# Patient Record
Sex: Female | Born: 1945
Health system: Southern US, Community
[De-identification: ages and names within clinical notes are randomized; demographics above are authoritative.]

## PROBLEM LIST (undated history)

## (undated) DIAGNOSIS — K859 Acute pancreatitis without necrosis or infection, unspecified: Secondary | ICD-10-CM

## (undated) DIAGNOSIS — R011 Cardiac murmur, unspecified: Secondary | ICD-10-CM

## (undated) DIAGNOSIS — F1020 Alcohol dependence, uncomplicated: Secondary | ICD-10-CM

## (undated) DIAGNOSIS — C052 Malignant neoplasm of uvula: Secondary | ICD-10-CM

## (undated) DIAGNOSIS — I10 Essential (primary) hypertension: Secondary | ICD-10-CM

## (undated) DIAGNOSIS — E039 Hypothyroidism, unspecified: Secondary | ICD-10-CM

## (undated) HISTORY — PX: KNEE SURGERY: SHX244

## (undated) HISTORY — PX: COLONOSCOPY: SHX174

## (undated) HISTORY — PX: NECK SURGERY: SHX720

## (undated) HISTORY — PX: TONSILLECTOMY: SUR1361

## (undated) HISTORY — DX: Hypothyroidism, unspecified: E03.9

## (undated) HISTORY — DX: Essential (primary) hypertension: I10

---

## 2009-02-17 ENCOUNTER — Emergency Department (HOSPITAL_COMMUNITY): Admission: EM | Admit: 2009-02-17 | Discharge: 2009-02-17 | Payer: Self-pay | Admitting: Emergency Medicine

## 2009-02-23 ENCOUNTER — Ambulatory Visit: Payer: Self-pay | Admitting: Internal Medicine

## 2009-02-23 DIAGNOSIS — I10 Essential (primary) hypertension: Secondary | ICD-10-CM | POA: Insufficient documentation

## 2009-02-23 DIAGNOSIS — R7309 Other abnormal glucose: Secondary | ICD-10-CM | POA: Insufficient documentation

## 2009-02-23 DIAGNOSIS — D6489 Other specified anemias: Secondary | ICD-10-CM | POA: Insufficient documentation

## 2009-02-23 DIAGNOSIS — E039 Hypothyroidism, unspecified: Secondary | ICD-10-CM | POA: Insufficient documentation

## 2009-02-23 DIAGNOSIS — N181 Chronic kidney disease, stage 1: Secondary | ICD-10-CM | POA: Insufficient documentation

## 2009-02-23 LAB — CONVERTED CEMR LAB
ALT: 21 units/L (ref 0–35)
AST: 30 units/L (ref 0–37)
Alkaline Phosphatase: 49 units/L (ref 39–117)
BUN: 25 mg/dL — ABNORMAL HIGH (ref 6–23)
Basophils Absolute: 0 10*3/uL (ref 0.0–0.1)
CO2: 28 meq/L (ref 19–32)
Chloride: 107 meq/L (ref 96–112)
Creatinine, Ser: 1.1 mg/dL (ref 0.4–1.2)
Eosinophils Absolute: 0.2 10*3/uL (ref 0.0–0.7)
Eosinophils Relative: 2.9 % (ref 0.0–5.0)
Folate: >20 ng/mL
Hemoglobin, Urine: NEGATIVE
Iron: 57 ug/dL (ref 42–145)
Leukocytes, UA: NEGATIVE
Lymphs Abs: 1.7 10*3/uL (ref 0.7–4.0)
MCHC: 33.4 g/dL (ref 30.0–36.0)
Monocytes Absolute: 0.4 10*3/uL (ref 0.1–1.0)
Neutro Abs: 4.1 10*3/uL (ref 1.4–7.7)
Potassium: 4.9 meq/L (ref 3.5–5.1)
Saturation Ratios: 18.7 % — ABNORMAL LOW (ref 20.0–50.0)
Sodium: 143 meq/L (ref 135–145)
Total Protein: 6.8 g/dL (ref 6.0–8.3)
Transferrin: 218.1 mg/dL (ref 212.0–360.0)
Urine Glucose: NEGATIVE mg/dL
WBC: 6.4 10*3/uL (ref 4.5–10.5)

## 2009-02-24 ENCOUNTER — Encounter: Payer: Self-pay | Admitting: Internal Medicine

## 2009-02-24 ENCOUNTER — Telehealth: Payer: Self-pay | Admitting: Internal Medicine

## 2009-02-26 ENCOUNTER — Encounter: Payer: Self-pay | Admitting: Internal Medicine

## 2009-03-02 ENCOUNTER — Telehealth: Payer: Self-pay | Admitting: Internal Medicine

## 2009-05-06 ENCOUNTER — Telehealth: Payer: Self-pay | Admitting: Internal Medicine

## 2009-09-09 ENCOUNTER — Telehealth: Payer: Self-pay | Admitting: Internal Medicine

## 2009-10-15 ENCOUNTER — Telehealth: Payer: Self-pay | Admitting: Internal Medicine

## 2009-10-19 ENCOUNTER — Ambulatory Visit: Payer: Self-pay | Admitting: Internal Medicine

## 2009-10-19 ENCOUNTER — Other Ambulatory Visit: Admission: RE | Admit: 2009-10-19 | Discharge: 2009-10-19 | Payer: Self-pay | Admitting: Internal Medicine

## 2009-10-19 DIAGNOSIS — D485 Neoplasm of uncertain behavior of skin: Secondary | ICD-10-CM | POA: Insufficient documentation

## 2009-10-19 LAB — CONVERTED CEMR LAB
AST: 187 units/L — ABNORMAL HIGH (ref 0–37)
Basophils Relative: 0.8 % (ref 0.0–3.0)
Bilirubin Urine: NEGATIVE
Calcium: 9.5 mg/dL (ref 8.4–10.5)
Cholesterol: 241 mg/dL — ABNORMAL HIGH (ref 0–200)
Creatinine, Ser: 1.4 mg/dL — ABNORMAL HIGH (ref 0.4–1.2)
Eosinophils Relative: 1.4 % (ref 0.0–5.0)
GFR calc non Af Amer: 40.17 mL/min (ref >60)
HDL: 87.2 mg/dL (ref >39.00)
Lymphocytes Relative: 21.6 % (ref 12.0–46.0)
Lymphs Abs: 1.6 10*3/uL (ref 0.7–4.0)
MCHC: 33.3 g/dL (ref 30.0–36.0)
Monocytes Absolute: 0.4 10*3/uL (ref 0.1–1.0)
Monocytes Relative: 5.7 % (ref 3.0–12.0)
Neutrophils Relative %: 70.5 % (ref 43.0–77.0)
Pap Smear: NEGATIVE
Potassium: 4.5 meq/L (ref 3.5–5.1)
RBC: 3.79 M/uL — ABNORMAL LOW (ref 3.87–5.11)
Specific Gravity, Urine: 1.02 (ref 1.000–1.030)
TSH: 55.07 microintl units/mL — ABNORMAL HIGH (ref 0.35–5.50)
Total Protein: 6.9 g/dL (ref 6.0–8.3)
Urine Glucose: NEGATIVE mg/dL
VLDL: 8 mg/dL (ref 0.0–40.0)
pH: 6 (ref 5.0–8.0)

## 2009-10-20 ENCOUNTER — Telehealth: Payer: Self-pay | Admitting: Internal Medicine

## 2009-10-20 DIAGNOSIS — R7401 Elevation of levels of liver transaminase levels: Secondary | ICD-10-CM | POA: Insufficient documentation

## 2009-10-20 DIAGNOSIS — R740 Nonspecific elevation of levels of transaminase and lactic acid dehydrogenase [LDH]: Secondary | ICD-10-CM

## 2009-10-21 ENCOUNTER — Encounter: Payer: Self-pay | Admitting: Internal Medicine

## 2009-10-22 ENCOUNTER — Encounter: Admission: RE | Admit: 2009-10-22 | Discharge: 2009-10-22 | Payer: Self-pay | Admitting: Internal Medicine

## 2009-10-22 ENCOUNTER — Encounter: Payer: Self-pay | Admitting: Internal Medicine

## 2009-10-30 ENCOUNTER — Ambulatory Visit: Payer: Self-pay | Admitting: Internal Medicine

## 2009-10-30 LAB — CONVERTED CEMR LAB
ALT: 91 units/L — ABNORMAL HIGH (ref 0–35)
Bilirubin, Direct: 0 mg/dL (ref 0.0–0.3)
HCV Ab: NEGATIVE
Total Protein: 6.8 g/dL (ref 6.0–8.3)

## 2009-11-04 ENCOUNTER — Encounter: Payer: Self-pay | Admitting: Internal Medicine

## 2009-11-10 ENCOUNTER — Encounter: Admission: RE | Admit: 2009-11-10 | Discharge: 2009-11-10 | Payer: Self-pay | Admitting: Internal Medicine

## 2009-11-10 LAB — HM MAMMOGRAPHY: HM Mammogram: NEGATIVE

## 2009-11-15 ENCOUNTER — Emergency Department (HOSPITAL_COMMUNITY): Admission: EM | Admit: 2009-11-15 | Discharge: 2009-11-16 | Payer: Self-pay | Admitting: Emergency Medicine

## 2009-11-16 ENCOUNTER — Telehealth: Payer: Self-pay | Admitting: Internal Medicine

## 2010-03-02 ENCOUNTER — Emergency Department (HOSPITAL_COMMUNITY): Admission: EM | Admit: 2010-03-02 | Discharge: 2010-03-03 | Payer: Self-pay | Admitting: Emergency Medicine

## 2010-03-05 ENCOUNTER — Emergency Department (HOSPITAL_COMMUNITY): Admission: EM | Admit: 2010-03-05 | Discharge: 2010-03-06 | Payer: Self-pay | Admitting: Emergency Medicine

## 2010-03-05 ENCOUNTER — Ambulatory Visit: Payer: Self-pay | Admitting: Psychiatry

## 2010-03-05 ENCOUNTER — Ambulatory Visit: Payer: Self-pay | Admitting: Internal Medicine

## 2010-03-05 DIAGNOSIS — F10229 Alcohol dependence with intoxication, unspecified: Secondary | ICD-10-CM | POA: Insufficient documentation

## 2010-03-06 ENCOUNTER — Inpatient Hospital Stay (HOSPITAL_COMMUNITY): Admission: AD | Admit: 2010-03-06 | Discharge: 2010-03-08 | Payer: Self-pay | Admitting: Psychiatry

## 2010-03-09 ENCOUNTER — Ambulatory Visit: Payer: Self-pay | Admitting: Internal Medicine

## 2010-04-11 ENCOUNTER — Ambulatory Visit: Payer: Self-pay | Admitting: Psychiatry

## 2010-04-11 ENCOUNTER — Inpatient Hospital Stay (HOSPITAL_COMMUNITY): Admission: RE | Admit: 2010-04-11 | Discharge: 2010-04-15 | Payer: Self-pay | Admitting: Psychiatry

## 2010-04-11 ENCOUNTER — Other Ambulatory Visit: Payer: Self-pay | Admitting: Emergency Medicine

## 2010-04-16 ENCOUNTER — Other Ambulatory Visit (HOSPITAL_COMMUNITY)
Admission: RE | Admit: 2010-04-16 | Discharge: 2010-05-27 | Payer: Self-pay | Source: Home / Self Care | Admitting: Psychiatry

## 2010-05-14 ENCOUNTER — Ambulatory Visit: Payer: Self-pay | Admitting: Psychiatry

## 2010-06-01 ENCOUNTER — Ambulatory Visit (HOSPITAL_COMMUNITY): Payer: Self-pay | Admitting: Psychiatry

## 2010-06-07 ENCOUNTER — Telehealth: Payer: Self-pay | Admitting: Internal Medicine

## 2010-06-07 DIAGNOSIS — H40009 Preglaucoma, unspecified, unspecified eye: Secondary | ICD-10-CM | POA: Insufficient documentation

## 2010-06-08 ENCOUNTER — Ambulatory Visit (HOSPITAL_COMMUNITY): Payer: Self-pay | Admitting: Psychiatry

## 2010-06-14 ENCOUNTER — Encounter: Payer: Self-pay | Admitting: Internal Medicine

## 2010-08-30 ENCOUNTER — Telehealth: Payer: Self-pay | Admitting: Internal Medicine

## 2010-08-31 NOTE — Letter (Signed)
Summary: Results Follow-up Letter  [facility]  [address] [place], [place] [address]   Phone: [phone]  Fax: [phone]    10/21/2009  [address] [place], [place]  [address]  Dear Ms. Tumblin,   The following are the results of your recent test(s):  Test     Result     Pap Smear    Normal____xx___  Not Normal_____       Comments:    _________________________________________________________  Please call for an appointment as directed _________________________________________________________ _________________________________________________________ _________________________________________________________  Sincerely,  Sanda Linger MD [facility]

## 2010-08-31 NOTE — Progress Notes (Signed)
Summary: REFERRAL  Phone Note Call from Patient   Summary of Call: Patient is requesting referral for opthalmologist. Initial call taken by: Lamar Sprinkles, CMA,  June 07, 2010 9:52 AM  Follow-up for Phone Call        and the diagnosis is? Follow-up by: Etta Grandchild MD,  June 07, 2010 9:54 AM  Additional Follow-up for Phone Call Additional follow up Details #1::        Pt says that she needs referral to get her eye pressure checked and annual exam. She needs referral for insurance reasons.  Additional Follow-up by: Lamar Sprinkles, CMA,  June 07, 2010 10:37 AM  New Problems: UNSPECIFIED PREGLAUCOMA (ICD-365.00)   New Problems: UNSPECIFIED PREGLAUCOMA (ICD-365.00)

## 2010-08-31 NOTE — Assessment & Plan Note (Signed)
Summary: 2 WK FU---PER PT PER THIS OFFICE--STC   Vital Signs:  Patient profile:   65 year old female Height:      67 inches Weight:      132 pounds O2 Sat:      97 % on Room air Temp:     98.2 degrees F oral Pulse rate:   60 / minute Pulse rhythm:   regular Resp:     16 per minute BP sitting:   132 / 72  (right arm)  Vitals Entered By: Rock Nephew CMA (October 30, 2009 8:09 AM)  O2 Flow:  Room air  Primary Care Provider:  Etta Grandchild MD   History of Present Illness: She returns for f/up and her only complaint is fatigue.  Allergies: 1)  ! Ace Inhibitors  Past History:  Past Medical History: Reviewed history from 02/23/2009 and no changes required. Hypothyroidism Hypertension  Past Surgical History: Reviewed history from 02/23/2009 and no changes required. Tonsillectomy  Family History: Reviewed history from 10/19/2009 and no changes required. Family History Breast cancer 1st degree relative <50  Social History: Reviewed history from 10/19/2009 and no changes required. Retired Single Alcohol use-no Drug use-no Regular exercise-yes  Review of Systems  The patient denies anorexia, fever, weight loss, weight gain, chest pain, headaches, abdominal pain, hematuria, depression, enlarged lymph nodes, and angioedema.   GI:  Denies abdominal pain, bloody stools, change in bowel habits, diarrhea, indigestion, loss of appetite, nausea, vomiting, and yellowish skin color.  Physical Exam  General:  alert, well-developed, well-nourished, well-hydrated, cooperative to examination, and good hygiene.   Head:  normocephalic and atraumatic.   Eyes:  vision grossly intact, pupils equal, pupils round, and pupils reactive to light.  no icterus. Mouth:  Oral mucosa and oropharynx without lesions or exudates.  Teeth in good repair. Neck:  No deformities, masses, or tenderness noted. Lungs:  Normal respiratory effort, chest expands symmetrically. Lungs are clear to  auscultation, no crackles or wheezes. Heart:  Normal rate and regular rhythm. S1 and S2 normal without gallop, murmur, click, rub or other extra sounds. Abdomen:  soft, non-tender, normal bowel sounds, no distention, no masses, no guarding, no rigidity, no rebound tenderness, no hepatomegaly, and no splenomegaly.   Msk:  No deformity or scoliosis noted of thoracic or lumbar spine.   Pulses:  R and L carotid,radial,femoral,dorsalis pedis and posterior tibial pulses are full and equal bilaterally Extremities:  No clubbing, cyanosis, edema, or deformity noted with normal full range of motion of all joints.   Neurologic:  No cranial nerve deficits noted. Station and gait are normal. Plantar reflexes are down-going bilaterally. DTRs are symmetrical throughout. Sensory, motor and coordinative functions appear intact. Skin:  turgor normal, color normal, no rashes, no suspicious lesions, no ecchymoses, no petechiae, no purpura, no ulcerations, and no edema.   Cervical Nodes:  no anterior cervical adenopathy and no posterior cervical adenopathy.   Axillary Nodes:  no R axillary adenopathy and no L axillary adenopathy.   Inguinal Nodes:  no R inguinal adenopathy and no L inguinal adenopathy.   Psych:  Oriented X3, memory intact for recent and remote, normally interactive, good eye contact, not anxious appearing, not depressed appearing, not agitated, and not suicidal.     Impression & Recommendations:  Problem # 1:  TRANSAMINASES, SERUM, ELEVATED (ICD-790.4) Assessment Unchanged this looks like Fatty Liver Dz. but will look for viral causes as well. Orders: Venipuncture ([address]) TLB-Hepatic/Liver Function Pnl (80076-HEPATIC) T-Hepatitis C Antibody ([id number]) T-Hepatitis Acute  Panel [phone])  Problem # 2:  HYPERTENSION (ICD-401.9) Assessment: Improved  BP today: 132/72 Prior BP: 120/78 (10/19/2009)  Prior 10 Yr Risk Heart Disease: Not enough information (02/23/2009)  Labs Reviewed: K+:  4.5 (10/19/2009) Creat: : 1.4 (10/19/2009)   Chol: 241 (10/19/2009)   HDL: 87.20 (10/19/2009)   TG: 40.0 (10/19/2009)  Problem # 3:  HYPOTHYROIDISM (ICD-244.9) Assessment: Improved  Her updated medication list for this problem includes:    Levothroid 125 Mcg Tabs (Levothyroxine sodium) ..... One by mouth once daily for thyroid  Labs Reviewed: TSH: 55.07 (10/19/2009)    HgBA1c: 5.7 (10/19/2009) Chol: 241 (10/19/2009)   HDL: 87.20 (10/19/2009)   TG: 40.0 (10/19/2009)  Problem # 4:  KIDNEY DISEASE, CHRONIC, STAGE I (ICD-585.1) Assessment: Improved  Labs Reviewed: BUN: 29 (10/19/2009)   Cr: 1.4 (10/19/2009)    Hgb: 12.1 (10/19/2009)   Hct: 36.5 (10/19/2009)   Ca++: 9.5 (10/19/2009)    TP: 6.9 (10/19/2009)   Alb: 4.1 (10/19/2009)  Complete Medication List: 1)  Antabuse 250 Mg Tabs (Disulfiram) .... Take 1 tablet by mouth once a day 2)  Calcium/vitamind  3)  Daily Multivitamin  4)  Folic Acid  5)  Vitamin B12  6)  Prozac 40 Mg Caps (Fluoxetine hcl) .... Take 1 tab once daily 7)  Vitamin D3 1000 Unit Caps (Cholecalciferol) .... Take once daily 8)  Herbal Laxative  .... Once daily as needed 9)  Levothroid 125 Mcg Tabs (Levothyroxine sodium) .... One by mouth once daily for thyroid  Patient Instructions: 1)  Please schedule a follow-up appointment in 2 months. 2)  It is important that you exercise regularly at least 20 minutes 5 times a week. If you develop chest pain, have severe difficulty breathing, or feel very tired , stop exercising immediately and seek medical attention. 3)  You need to lose weight. Consider a lower calorie diet and regular exercise.

## 2010-08-31 NOTE — Progress Notes (Signed)
Summary: medication unavaliable  Phone Note From Pharmacy   Caller: [facility] Summary of Call: Per [facility], Antabuse 250mg  and 500mg  are unavailable from the manufacturer. Would you like to  substitute and if so what would you like to switch to? Thanks Initial call taken by: Rock Nephew CMA,  September 09, 2009 9:08 AM  Follow-up for Phone Call        ask the pharmacy what doses are available Follow-up by: Etta Grandchild MD,  September 09, 2009 9:11 AM  Additional Follow-up for Phone Call Additional follow up Details #1::        [facility] does not have any of the med available, but suggest that we check local pharmacy or change med. Patient local pharmacy did have the prescription they will fill now for patient. I tried to call and notify patient--Rec'd automated message that [employer] customer is not available. Additional Follow-up by: Lucious Groves,  September 14, 2009 3:40 PM    Additional Follow-up for Phone Call Additional follow up Details #2::    Tried to reach patient again and rec'd the same automated message. Follow-up by: Lucious Groves,  September 15, 2009 2:47 PM  Additional Follow-up for Phone Call Additional follow up Details #3:: Details for Additional Follow-up Action Taken: No response from patient x several attemps. Closing phone note Additional Follow-up by: Rock Nephew CMA,  September 17, 2009 11:23 AM

## 2010-08-31 NOTE — Consult Note (Signed)
Summary: [facility]   Imported By: Lester [place]erville 06/22/2010 07:24:23  _____________________________________________________________________  External Attachment:    Type:   Image     Comment:   External Document

## 2010-08-31 NOTE — Progress Notes (Signed)
Summary: Thyroid med  Phone Note Call from Patient Call back at Home Phone [phone]   Caller: Patient Summary of Call: pt called Triage requesting a call Initial call taken by: Margaret Pyle, CMA,  October 20, 2009 1:32 PM  Follow-up for Phone Call        Patient would like to know why her thyroid pills have changed. She would also like to take her lower dose tabs that she has already paid for instead of picking up a new prescription. Can she take 1.5 or 2 of the tabs she currently has. Please advise. Follow-up by: Lucious Groves,  October 21, 2009 8:56 AM  Additional Follow-up for Phone Call Additional follow up Details #1::        i think she needs the new, higher dose but if she wants to do it a different way she can Additional Follow-up by: Etta Grandchild MD,  October 21, 2009 9:05 AM    Additional Follow-up for Phone Call Additional follow up Details #2::    I notified patient. She is worried b/c we sent in 125 mcg and pharmacy gave her .125mg . She will call her pharmacy to discuss. Patient will take her 50 micrograms tabs, but be sure that she is taking 125 micrograms as md prescribed. Follow-up by: Lucious Groves,  October 21, 2009 10:49 AM

## 2010-08-31 NOTE — Letter (Signed)
Summary: Lipid Letter  [facility]  [address] [place], [place] [address]   Phone: [phone]  Fax: [phone]    10/19/2009  Mary Graves [address] [place], [place]  [address]  Dear Ms. Scarlette Calico:  We have carefully reviewed your last lipid profile from  and the results are noted below with a summary of recommendations for lipid management.    Cholesterol:       241     Goal: <200   HDL "good" Cholesterol:   57.84     Goal: >40   LDL "bad" Cholesterol:   142     Goal: <130   Triglycerides:       40.0     Goal: <150        TLC Diet (Therapeutic Lifestyle Change): Saturated Fats & Transfatty acids should be kept < 7% of total calories ***Reduce Saturated Fats Polyunstaurated Fat can be up to 10% of total calories Monounsaturated Fat Fat can be up to 20% of total calories Total Fat should be no greater than 25-35% of total calories Carbohydrates should be 50-60% of total calories Protein should be approximately 15% of total calories Fiber should be at least 20-30 grams a day ***Increased fiber may help lower LDL Total Cholesterol should be < 200mg /day Consider adding plant stanol/sterols to diet (example: Benacol spread) ***A higher intake of unsaturated fat may reduce Triglycerides and Increase HDL    Adjunctive Measures (may lower LIPIDS and reduce risk of Heart Attack) include: Aerobic Exercise (20-30 minutes 3-4 times a week) Limit Alcohol Consumption Weight Reduction Aspirin 75-81 mg a day by mouth (if not allergic or contraindicated) Dietary Fiber 20-30 grams a day by mouth     Current Medications: 1)    Levothroid 50 Mcg Tabs (Levothyroxine sodium) .... Take 1 tablet by mouth once a day 2)    Antabuse 250 Mg Tabs (Disulfiram) .... Take 1 tablet by mouth once a day 3)    Calcium/vitamind  4)    Daily Multivitamin  5)    Folic Acid  6)    Vitamin B12  7)    Prozac 40 Mg Caps (Fluoxetine hcl) .... Take 1 tab once daily 8)    Vitamin D3  1000 Unit Caps (Cholecalciferol) .... Take once daily 9)    Herbal Laxative  .... Once daily as needed  If you have any questions, please call. We appreciate being able to work with you.   Sincerely,    [facility] Etta Grandchild MD

## 2010-08-31 NOTE — Letter (Signed)
Summary: Results Follow-up Letter  [facility]  [address] [place], [place] [address]   Phone: [phone]  Fax: [phone]    11/04/2009  [address] [place], [place]  [address]  Dear Ms. Nicasio,   The following are the results of your recent test(s):  Test     Result     Liver enzymes   slightly elevated Viral hepatitis   all negative  _________________________________________________________  Please call for an appointment as directed, let me know if you want to be vaccinated for Hepatits A and B _________________________________________________________ _________________________________________________________ _________________________________________________________  Sincerely,  Sanda Linger MD [facility]

## 2010-08-31 NOTE — Letter (Signed)
Summary: Results Follow-up Letter  [facility]  [address] [place], [place] [address]   Phone: [phone]  Fax: [phone]    10/19/2009  [address] [place], [place]  [address]  Dear Ms. Baggett,   The following are the results of your recent test(s):  Test     Result     Kidney     mild dysfunction CBC       normal Liver       moderately elevated enzymes Blood sugars   normal   _________________________________________________________  Please call for an appointment soon to recheck liver _________________________________________________________ _________________________________________________________ _________________________________________________________  Sincerely,  Sanda Linger MD [facility]

## 2010-08-31 NOTE — Progress Notes (Signed)
Summary: referrals/labs  Phone Note Call from Patient Call back at [phone]   Summary of Call: Patient left message on triage that she needs a referral for mammogram, and dermatologist. Patient also states that due to several of her meds, she needs labs. Patient is aware she has not been seen in sometime. Please advise. Initial call taken by: Lucious Groves,  October 15, 2009 3:18 PM  Follow-up for Phone Call        needs to be seen Follow-up by: Etta Grandchild MD,  October 15, 2009 3:24 PM  Additional Follow-up for Phone Call Additional follow up Details #1::        left message on machine to call back to office. Additional Follow-up by: Lucious Groves,  October 15, 2009 3:37 PM    Additional Follow-up for Phone Call Additional follow up Details #2::    Unable to leave vm........................................Marland KitchenLamar Sprinkles, CMA  October 15, 2009 5:26 PM   Additional Follow-up for Phone Call Additional follow up Details #3:: Details for Additional Follow-up Action Taken: Pt scheduled for physcial  Additional Follow-up by: Lamar Sprinkles, CMA,  October 16, 2009 5:47 PM

## 2010-08-31 NOTE — Letter (Signed)
Summary: Results Follow-up Letter  [facility]  [address] [place], [place] [address]   Phone: [phone]  Fax: [phone]    10/22/2009  [address] [place], [place]  [address]  Dear Mary Graves,   The following are the results of your recent test(s):  Test       Result     Liver ultrasound     fatty liver  _________________________________________________________  Please call for an appointment as directed _________________________________________________________ _________________________________________________________ _________________________________________________________  Sincerely,  Sanda Linger MD [facility]

## 2010-08-31 NOTE — Progress Notes (Signed)
  Phone Note Outgoing Call   Summary of Call: i notified pt. of high TSH and the need for a higher dose as well as high LFT's, she will stop taking antabuse and get an u/s done of the liver Initial call taken by: Etta Grandchild MD,  October 20, 2009 8:36 AM  New Problems: TRANSAMINASES, SERUM, ELEVATED (ICD-790.4)   New Problems: TRANSAMINASES, SERUM, ELEVATED (ICD-790.4) New/Updated Medications: LEVOTHROID 125 MCG TABS (LEVOTHYROXINE SODIUM) One by mouth once daily for thyroid Prescriptions: LEVOTHROID 125 MCG TABS (LEVOTHYROXINE SODIUM) One by mouth once daily for thyroid  #90 x 1   Entered and Authorized by:   Etta Grandchild MD   Signed by:   Etta Grandchild MD on 10/20/2009   Method used:   Electronically to        [facility] # [phone]* (retail)       [address]       [place], [place]  [address]       Ph: [phone]       Fax: [phone]   RxID:   [id number]

## 2010-08-31 NOTE — Assessment & Plan Note (Signed)
Summary: shot of vivotrol-lb   Vital Signs:  Patient profile:   65 year old female Height:      67 inches Weight:      128 pounds BMI:     20.12 O2 Sat:      98 % on Room air Temp:     98.1 degrees F oral Pulse rate:   82 / minute Pulse rhythm:   regular Resp:     16 per minute BP sitting:   120 / 68 Cuff size:   regular  Vitals Entered By: Rock Nephew CMA (March 09, 2010 8:26 AM)  O2 Flow:  Room air CC: ER follow up/ discuss injection  Is Patient Diabetic? No Pain Assessment Patient in pain? no        Primary Care Jolene Guyett:  Etta Grandchild MD  CC:  ER follow up/ discuss injection .  History of Present Illness: She returns for f/up and she says that was discharged from [facility] yesterday after a stay for detox. She says that she is doing well with sponsor contact, daily AA mettings, and evening therapy at the [facility]. She wants a shot of Vivitrol. She has had no alcohol for 4 days.  Preventive Screening-Counseling & Management  Alcohol-Tobacco     Alcohol drinks/day: >5     Alcohol type: wine     >5/day in last 3 mos: yes     Alcohol Counseling: to STOP drinking     Feels need to cut down: yes     Feels annoyed by complaints: yes     Feels guilty re: drinking: yes     Needs 'eye opener' in am: yes     Smoking Status: quit     Tobacco Counseling: to remain off tobacco products  Hep-HIV-STD-Contraception     Hepatitis Risk: no risk noted     HIV Risk: no risk noted     STD Risk: no risk noted  Medications Prior to Update: 1)  Antabuse 250 Mg Tabs (Disulfiram) .... Take 1 Tablet By Mouth Once A Day 2)  Calcium/vitamind 3)  Daily Multivitamin 4)  Folic Acid 5)  Vitamin B12 6)  Prozac 40 Mg Caps (Fluoxetine Hcl) .... Take 1 Tab Once Daily 7)  Vitamin D3 1000 Unit Caps (Cholecalciferol) .... Take Once Daily 8)  Herbal Laxative .... Once Daily As Needed 9)  Levothroid 125 Mcg Tabs (Levothyroxine Sodium) .... One By Mouth Once Daily For Thyroid  Current  Medications (verified): 1)  Antabuse 250 Mg Tabs (Disulfiram) .... Take 1 Tablet By Mouth Once A Day 2)  Calcium/vitamind 3)  Daily Multivitamin 4)  Folic Acid 5)  Vitamin B12 6)  Prozac 40 Mg Caps (Fluoxetine Hcl) .... Take 1 Tab Once Daily 7)  Vitamin D3 1000 Unit Caps (Cholecalciferol) .... Take Once Daily 8)  Herbal Laxative .... Once Daily As Needed 9)  Levothroid 125 Mcg Tabs (Levothyroxine Sodium) .... One By Mouth Once Daily For Thyroid  Allergies (verified): 1)  ! Ace Inhibitors  Past History:  Past Medical History: Last updated: 02/23/2009 Hypothyroidism Hypertension  Past Surgical History: Last updated: 02/23/2009 Tonsillectomy  Family History: Last updated: 10/19/2009 Family History Breast cancer 1st degree relative <50  Social History: Last updated: 10/19/2009 Retired Single Alcohol use-no Drug use-no Regular exercise-yes  Risk Factors: Alcohol Use: >5 (03/09/2010) >5 drinks/d w/in last 3 months: yes (03/09/2010) Exercise: yes (10/19/2009)  Risk Factors: Smoking Status: quit (03/09/2010)  Family History: Reviewed history from 10/19/2009 and no changes required.  Family History Breast cancer 1st degree relative <50  Social History: Reviewed history from 10/19/2009 and no changes required. Retired Single Alcohol use-no Drug use-no Regular exercise-yes  Review of Systems  The patient denies anorexia, fever, weight loss, chest pain, syncope, dyspnea on exertion, peripheral edema, prolonged cough, headaches, hemoptysis, abdominal pain, difficulty walking, and depression.   Psych:  Denies anxiety, depression, easily angered, easily tearful, irritability, mental problems, panic attacks, sense of great danger, suicidal thoughts/plans, thoughts of violence, unusual visions or sounds, and thoughts /plans of harming others.  Physical Exam  General:  alert, well-developed, well-nourished, well-hydrated, appropriate dress, normal appearance,  healthy-appearing, cooperative to examination, and good hygiene.   Head:  normocephalic, atraumatic, no abnormalities observed, and no abnormalities palpated.   Eyes:  vision grossly intact, pupils equal, pupils round, and pupils reactive to light.   Ears:  R ear normal and L ear normal.   Mouth:  Oral mucosa and oropharynx without lesions or exudates.  Teeth in good repair. Neck:  supple, full ROM, no masses, no thyromegaly, and no thyroid nodules or tenderness.   Lungs:  Normal respiratory effort, chest expands symmetrically. Lungs are clear to auscultation, no crackles or wheezes. Heart:  Normal rate and regular rhythm. S1 and S2 normal without gallop, murmur, click, rub or other extra sounds. Abdomen:  soft, non-tender, normal bowel sounds, no distention, no masses, no guarding, no rigidity, no rebound tenderness, no abdominal hernia, no inguinal hernia, no hepatomegaly, and no splenomegaly.   Msk:  No deformity or scoliosis noted of thoracic or lumbar spine.   Extremities:  No clubbing, cyanosis, edema, or deformity noted with normal full range of motion of all joints.   Neurologic:  No cranial nerve deficits noted. Station and gait are normal. Plantar reflexes are down-going bilaterally. DTRs are symmetrical throughout. Sensory, motor and coordinative functions appear intact. Skin:  Intact without suspicious lesions or rashes Psych:  Oriented X3, memory intact for recent and remote, normally interactive, good eye contact, not anxious appearing, not depressed appearing, not agitated, not suicidal, and not homicidal.     Impression & Recommendations:  Problem # 1:  ACUT ALCOHOLIC INTOXICATION EPISODIC DRUNKENNESS (ICD-303.02) she is referred to her Psychiatrist, Dr. Juel Burrow, to discuss the use of Vivitrol and naltrexone, this is not a service that is provided here, we do not keep Vivitrol injection here. she will continue the other forms of support.  Problem # 2:  SUICIDE AND SELF-INFLICTED  INJURY BY SUBMERSION (ICD-E954) Assessment: Improved  Problem # 3:  HYPERTENSION (ICD-401.9) Assessment: Improved  BP today: 120/68 Prior BP: 138/84 (03/05/2010)  Prior 10 Yr Risk Heart Disease: Not enough information (02/23/2009)  Labs Reviewed: K+: 4.5 (10/19/2009) Creat: : 1.4 (10/19/2009)   Chol: 241 (10/19/2009)   HDL: 87.20 (10/19/2009)   TG: 40.0 (10/19/2009)  Complete Medication List: 1)  Calcium/vitamind  2)  Daily Multivitamin  3)  Folic Acid  4)  Vitamin B12  5)  Prozac 40 Mg Caps (Fluoxetine hcl) .... Take 1 tab once daily 6)  Vitamin D3 1000 Unit Caps (Cholecalciferol) .... Take once daily 7)  Herbal Laxative  .... Once daily as needed 8)  Levothroid 125 Mcg Tabs (Levothyroxine sodium) .... One by mouth once daily for thyroid  Patient Instructions: 1)  Please schedule a follow-up appointment in 2 weeks. 2)  Check your Blood Pressure regularly. If it is above 140/90: you should make an appointment. Prescriptions: LEVOTHROID 125 MCG TABS (LEVOTHYROXINE SODIUM) One by mouth once daily for thyroid  #  90 x 1   Entered and Authorized by:   Etta Grandchild MD   Signed by:   Etta Grandchild MD on 03/09/2010   Method used:   Printed then faxed to ...       [facility] (mail-order)             , [place]         Ph: [phone]       Fax: [phone]   RxID:   [id number]

## 2010-08-31 NOTE — Assessment & Plan Note (Signed)
Summary: ER FU / NWS #   Vital Signs:  Patient profile:   65 year old female Height:      67 inches Weight:      127 pounds BMI:     19.96 O2 Sat:      96 % on Room air Temp:     98.4 degrees F oral Pulse rate:   105 / minute Resp:     16 per minute BP sitting:   138 / 84  (left arm) Cuff size:   regular  Vitals Entered By: Bill Salinas CMA (March 05, 2010 11:10 AM)  O2 Flow:  Room air  Primary Care Provider:  Etta Grandchild MD   History of Present Illness: She returns for f/up and has been drinking heavily. She was seen at [facility] 3 days ago and left. Yesterday to tried to commit  suicide by jumping  into [facility] but was pulled out by someone stronger than her. She admits to feeling suicidal with a plan and says she has given up and she can't stop drinking without a detox in rehab.  Preventive Screening-Counseling & Management  Alcohol-Tobacco     Alcohol drinks/day: >5     Alcohol type: wine     >5/day in last 3 mos: yes     Alcohol Counseling: to STOP drinking     Feels need to cut down: yes     Feels annoyed by complaints: yes     Feels guilty re: drinking: yes     Needs 'eye opener' in am: yes     Smoking Status: quit     Tobacco Counseling: to remain off tobacco products  Hep-HIV-STD-Contraception     Hepatitis Risk: no risk noted     HIV Risk: no risk noted     STD Risk: no risk noted  Medications Prior to Update: 1)  Antabuse 250 Mg Tabs (Disulfiram) .... Take 1 Tablet By Mouth Once A Day 2)  Calcium/vitamind 3)  Daily Multivitamin 4)  Folic Acid 5)  Vitamin B12 6)  Prozac 40 Mg Caps (Fluoxetine Hcl) .... Take 1 Tab Once Daily 7)  Vitamin D3 1000 Unit Caps (Cholecalciferol) .... Take Once Daily 8)  Herbal Laxative .... Once Daily As Needed 9)  Levothroid 125 Mcg Tabs (Levothyroxine Sodium) .... One By Mouth Once Daily For Thyroid  Current Medications (verified): 1)  Antabuse 250 Mg Tabs (Disulfiram) .... Take 1 Tablet By Mouth Once A Day 2)   Calcium/vitamind 3)  Daily Multivitamin 4)  Folic Acid 5)  Vitamin B12 6)  Prozac 40 Mg Caps (Fluoxetine Hcl) .... Take 1 Tab Once Daily 7)  Vitamin D3 1000 Unit Caps (Cholecalciferol) .... Take Once Daily 8)  Herbal Laxative .... Once Daily As Needed 9)  Levothroid 125 Mcg Tabs (Levothyroxine Sodium) .... One By Mouth Once Daily For Thyroid  Allergies (verified): 1)  ! Ace Inhibitors  Past History:  Past Medical History: Last updated: 02/23/2009 Hypothyroidism Hypertension  Past Surgical History: Last updated: 02/23/2009 Tonsillectomy  Family History: Last updated: 10/19/2009 Family History Breast cancer 1st degree relative <50  Social History: Last updated: 10/19/2009 Retired Single Alcohol use-no Drug use-no Regular exercise-yes  Risk Factors: Alcohol Use: >5 (03/05/2010) >5 drinks/d w/in last 3 months: yes (03/05/2010) Exercise: yes (10/19/2009)  Risk Factors: Smoking Status: quit (03/05/2010)  Family History: Reviewed history from 10/19/2009 and no changes required. Family History Breast cancer 1st degree relative <50  Social History: Reviewed history from 10/19/2009 and no changes  required. Retired Single Alcohol use-no Drug use-no Regular exercise-yes  Review of Systems       The patient complains of depression.  The patient denies anorexia, fever, chest pain, syncope, dyspnea on exertion, peripheral edema, prolonged cough, headaches, hemoptysis, and abdominal pain.   Psych:  Complains of anxiety, depression, easily angered, easily tearful, irritability, mental problems, panic attacks, sense of great danger, and suicidal thoughts/plans; denies unusual visions or sounds and thoughts /plans of harming others.  Physical Exam  General:  underweight appearing, disheveled, and mild distress.   Head:  normocephalic and atraumatic.   Eyes:  vision grossly intact, pupils equal, pupils round, and pupils reactive to light.   Mouth:  Oral mucosa and  oropharynx without lesions or exudates.  Teeth in good repair. Neck:  supple, full ROM, no masses, no thyromegaly, no JVD, normal carotid upstroke, and no carotid bruits.   Lungs:  normal respiratory effort, no intercostal retractions, no accessory muscle use, normal breath sounds, no dullness, no fremitus, no crackles, and no wheezes.   Heart:  normal rate, regular rhythm, no murmur, no gallop, no rub, and no JVD.   Abdomen:  soft, non-tender, normal bowel sounds, no distention, no masses, no guarding, no rigidity, no rebound tenderness, no abdominal hernia, no inguinal hernia, no hepatomegaly, and no splenomegaly.   Msk:  No deformity or scoliosis noted of thoracic or lumbar spine.   Pulses:  R and L carotid,radial,femoral,dorsalis pedis and posterior tibial pulses are full and equal bilaterally Extremities:  No clubbing, cyanosis, edema, or deformity noted with normal full range of motion of all joints.   Neurologic:  No cranial nerve deficits noted. Station and gait are normal. Plantar reflexes are down-going bilaterally. DTRs are symmetrical throughout. Sensory, motor and coordinative functions appear intact. Skin:  Intact without suspicious lesions or rashes Psych:  dysphoric affect, withdrawn, tearful, moderately anxious, poor concentration, memory impairment, and judgment poor.     Impression & Recommendations:  Problem # 1:  SUICIDE AND SELF-INFLICTED INJURY BY SUBMERSION (ICD-E954) Assessment New EMS called-she will be taken to the ER under custody for her protection  Problem # 2:  ACUT ALCOHOLIC INTOXICATION EPISODIC DRUNKENNESS (ICD-303.02) Assessment: Deteriorated  Complete Medication List: 1)  Antabuse 250 Mg Tabs (Disulfiram) .... Take 1 tablet by mouth once a day 2)  Calcium/vitamind  3)  Daily Multivitamin  4)  Folic Acid  5)  Vitamin B12  6)  Prozac 40 Mg Caps (Fluoxetine hcl) .... Take 1 tab once daily 7)  Vitamin D3 1000 Unit Caps (Cholecalciferol) .... Take once  daily 8)  Herbal Laxative  .... Once daily as needed 9)  Levothroid 125 Mcg Tabs (Levothyroxine sodium) .... One by mouth once daily for thyroid  Patient Instructions: 1)  Please schedule a follow-up appointment as needed.

## 2010-08-31 NOTE — Progress Notes (Signed)
Summary: REQ FOR RX  Phone Note Call from Patient Call back at [phone]   Summary of Call: Pt was at the ER last night. She stopped anabuse when she was told her liver enzymes were elevated. Since stopping med she resumed drinking alcohol again. She is trying to detox herself, which she says she has done on multiple occasions over the years. Per pt she has been thru many different detox facilities in the past. ER last night did not refer pt or give any suggestions. She is req rx for ativan or valium to help in this process.  Pt has sponsor comming over tonight and has not had any alcohol today.   Initial call taken by: Lamar Sprinkles, CMA,  November 16, 2009 2:58 PM  Follow-up for Phone Call        ativan and valium are crutches just like alcohol, I would prefer that shoe not start them Follow-up by: Etta Grandchild MD,  November 16, 2009 3:12 PM  Additional Follow-up for Phone Call Additional follow up Details #1::        Patient says that the only ways she can get thru the detox process w/alcohol w/o the help of above.   Also, how does she restart anabuse?  Additional Follow-up by: Lamar Sprinkles, CMA,  November 16, 2009 6:09 PM    Additional Follow-up for Phone Call Additional follow up Details #2::    just restart at the previous dose Follow-up by: Etta Grandchild MD,  November 17, 2009 7:32 AM  Additional Follow-up for Phone Call Additional follow up Details #3:: Details for Additional Follow-up Action Taken: Patient informed, She continues to request small qty of valium to help w/detox. Advised ER or contact detox center. Pt declines, says she has been an alcoholic > 40 yrs and thru multiple detox facilitys. She refuses ER or behav health. Offered apt to see Dr Yetta Barre, pt again refused.  Additional Follow-up by: Lamar Sprinkles, CMA,  November 17, 2009 11:34 AM

## 2010-08-31 NOTE — Assessment & Plan Note (Signed)
Summary: physical / SD   Vital Signs:  Patient profile:   65 year old female Height:      67 inches (170.18 cm) Weight:      133.38 pounds (60.63 kg) O2 Sat:      97 % on Room air Temp:     97.4 degrees F (36.33 degrees C) oral Pulse rate:   68 / minute Pulse rhythm:   regular Resp:     16 per minute BP sitting:   120 / 78  (left arm) Cuff size:   regular  Vitals Entered By: Rock Nephew CMA (October 19, 2009 8:17 AM) Taken by Debby Freiberg Christopher,SMA  O2 Flow:  Room air CC: PHYSICAL, Preventive Care   Primary Care Provider:  Etta Grandchild MD  CC:  PHYSICAL and Preventive Care.  History of Present Illness: She returns for f/up and requests a complete physical. She has a lesion on the left side of her face that she is concerned about and she requests a derm. referral.  Preventive Screening-Counseling & Management  Alcohol-Tobacco     Alcohol drinks/day: 0     >5/day in last 3 mos: yes     Alcohol Counseling: to STOP drinking     Feels need to cut down: yes     Feels annoyed by complaints: yes     Feels guilty re: drinking: yes     Needs 'eye opener' in am: no     Smoking Status: quit  Caffeine-Diet-Exercise     Does Patient Exercise: yes  Hep-HIV-STD-Contraception     Hepatitis Risk: no risk noted     HIV Risk: no risk noted     STD Risk: no risk noted      Drug Use:  no.    Clinical Review Panels:  Prevention   Last Colonoscopy:  done (11/29/2008)   Medications Prior to Update: 1)  Levothroid 50 Mcg Tabs (Levothyroxine Sodium) .... Take 1 Tablet By Mouth Once A Day 2)  Antabuse 250 Mg Tabs (Disulfiram) .... Take 1 Tablet By Mouth Once A Day 3)  Calcium/vitamind 4)  Daily Multivitamin 5)  Folic Acid 6)  Vitamin B12 7)  Lisinopril 10 Mg Tabs (Lisinopril) .... Take 1 Tablet By Mouth Once A Day  Current Medications (verified): 1)  Levothroid 50 Mcg Tabs (Levothyroxine Sodium) .... Take 1 Tablet By Mouth Once A Day 2)  Antabuse 250 Mg Tabs (Disulfiram)  .... Take 1 Tablet By Mouth Once A Day 3)  Calcium/vitamind 4)  Daily Multivitamin 5)  Folic Acid 6)  Vitamin B12 7)  Prozac 40 Mg Caps (Fluoxetine Hcl) .... Take 1 Tab Once Daily 8)  Vitamin D3 1000 Unit Caps (Cholecalciferol) .... Take Once Daily 9)  Herbal Laxative .... Once Daily As Needed  Allergies (verified): 1)  ! Ace Inhibitors  Past History:  Past Medical History: Reviewed history from 02/23/2009 and no changes required. Hypothyroidism Hypertension  Past Surgical History: Reviewed history from 02/23/2009 and no changes required. Tonsillectomy  Family History: Family History Breast cancer 1st degree relative <50  Social History: Retired Single Alcohol use-no Drug use-no Regular exercise-yes Hepatitis Risk:  no risk noted HIV Risk:  no risk noted STD Risk:  no risk noted Drug Use:  no Does Patient Exercise:  yes  Review of Systems       The patient complains of suspicious skin lesions.  The patient denies anorexia, fever, weight loss, weight gain, chest pain, syncope, dyspnea on exertion, peripheral edema, prolonged cough, headaches, hemoptysis,  abdominal pain, melena, hematochezia, severe indigestion/heartburn, hematuria, muscle weakness, depression, unusual weight change, abnormal bleeding, enlarged lymph nodes, angioedema, and breast masses.   GI:  Complains of constipation; denies abdominal pain, bloody stools, diarrhea, excessive appetite, gas, indigestion, loss of appetite, nausea, vomiting, and yellowish skin color. Psych:  Denies anxiety, depression, easily angered, easily tearful, irritability, mental problems, panic attacks, sense of great danger, suicidal thoughts/plans, thoughts of violence, and unusual visions or sounds. Endo:  Denies cold intolerance, excessive hunger, excessive thirst, excessive urination, heat intolerance, polyuria, and weight change. Heme:  Denies abnormal bruising, bleeding, enlarge lymph nodes, fevers, pallor, and skin  discoloration.  Physical Exam  General:  alert, well-developed, well-nourished, well-hydrated, cooperative to examination, and good hygiene.   Head:  normocephalic and atraumatic.   Eyes:  vision grossly intact, pupils equal, pupils round, and pupils reactive to light.   Mouth:  Oral mucosa and oropharynx without lesions or exudates.  Teeth in good repair. Neck:  supple, full ROM, no masses, no carotid bruits, no cervical lymphadenopathy, and no neck tenderness.   Chest Wall:  No deformities, masses, or tenderness noted. Breasts:  No mass, nodules, thickening, tenderness, bulging, retraction, inflamation, nipple discharge or skin changes noted.   Lungs:  Normal respiratory effort, chest expands symmetrically. Lungs are clear to auscultation, no crackles or wheezes. Heart:  Normal rate and regular rhythm. S1 and S2 normal without gallop, murmur, click, rub or other extra sounds. Abdomen:  Bowel sounds positive,abdomen soft and non-tender without masses, organomegaly or hernias noted. Rectal:  no external abnormalities, no hemorrhoids, normal sphincter tone, no masses, no tenderness, no fissures, no fistulae, and no perianal rash.  heme neg. stool. Genitalia:  normal introitus, no external lesions, no vaginal discharge, mucosa pink and moist, no vaginal or cervical lesions, no vaginal atrophy, no friaility or hemorrhage, normal uterus size and position, and no adnexal masses or tenderness.   Msk:  No deformity or scoliosis noted of thoracic or lumbar spine.   Pulses:  R and L carotid,radial,femoral,dorsalis pedis and posterior tibial pulses are full and equal bilaterally Extremities:  No clubbing, cyanosis, edema, or deformity noted with normal full range of motion of all joints.   Neurologic:  No cranial nerve deficits noted. Station and gait are normal. Plantar reflexes are down-going bilaterally. DTRs are symmetrical throughout. Sensory, motor and coordinative functions appear intact. Skin:  on  the left malar surface she has a vascular lesion that looks like sun-damaged skin. turgor normal, color normal, no rashes, no suspicious lesions, no ecchymoses, no petechiae, no purpura, no ulcerations, no edema, and solar damage.   Cervical Nodes:  no anterior cervical adenopathy and no posterior cervical adenopathy.   Axillary Nodes:  no R axillary adenopathy and no L axillary adenopathy.   Inguinal Nodes:  no R inguinal adenopathy and no L inguinal adenopathy.   Psych:  Oriented X3, memory intact for recent and remote, normally interactive, good eye contact, not anxious appearing, not depressed appearing, not agitated, and not suicidal.     Impression & Recommendations:  Problem # 1:  ROUTINE GENERAL MEDICAL EXAM@HEALTH  CARE FACL (ICD-V70.0) Assessment New  Orders: Radiology Referral (Radiology) Hemoccult Guaiac-1 spec.(in office) (16109)  Colonoscopy: done (11/29/2008) Td Booster: done (01/29/2009)   Flu Vax: given (04/01/2009)   TSH: 0.42 (02/23/2009)   HgbA1C: 5.5 (02/23/2009)    Discussed using sunscreen, use of alcohol, drug use, self breast exam, routine dental care, routine eye care, schedule for GYN exam, routine physical exam, seat belts, multiple vitamins,  osteoporosis prevention, adequate calcium intake in diet, recommendations for immunizations, mammograms and Pap smears.  Discussed exercise and checking cholesterol.    Problem # 2:  HYPERTENSION (ICD-401.9) Assessment: Improved  The following medications were removed from the medication list:    Lisinopril 10 Mg Tabs (Lisinopril) .Marland Kitchen... Take 1 tablet by mouth once a day  Orders: Venipuncture (16109) TLB-Lipid Panel (80061-LIPID) TLB-BMP (Basic Metabolic Panel-BMET) (80048-METABOL) TLB-CBC Platelet - w/Differential (85025-CBCD) TLB-Hepatic/Liver Function Pnl (80076-HEPATIC) TLB-TSH (Thyroid Stimulating Hormone) (84443-TSH) TLB-Udip w/ Micro (81001-URINE) TLB-A1C / Hgb A1C (Glycohemoglobin) (83036-A1C)  BP today:  120/78 Prior BP: 112/68 (02/23/2009)  Prior 10 Yr Risk Heart Disease: Not enough information (02/23/2009)  Labs Reviewed: K+: 4.9 (02/23/2009) Creat: : 1.1 (02/23/2009)     Problem # 3:  HYPOTHYROIDISM (ICD-244.9) Assessment: Unchanged  Her updated medication list for this problem includes:    Levothroid 50 Mcg Tabs (Levothyroxine sodium) .Marland Kitchen... Take 1 tablet by mouth once a day  Orders: Venipuncture (60454) TLB-Lipid Panel (80061-LIPID) TLB-BMP (Basic Metabolic Panel-BMET) (80048-METABOL) TLB-CBC Platelet - w/Differential (85025-CBCD) TLB-Hepatic/Liver Function Pnl (80076-HEPATIC) TLB-TSH (Thyroid Stimulating Hormone) (84443-TSH) TLB-Udip w/ Micro (81001-URINE) TLB-A1C / Hgb A1C (Glycohemoglobin) (83036-A1C)  Labs Reviewed: TSH: 0.42 (02/23/2009)    HgBA1c: 5.5 (02/23/2009)  Problem # 4:  KIDNEY DISEASE, CHRONIC, STAGE I (ICD-585.1) Assessment: Improved  Orders: Venipuncture (09811) TLB-Lipid Panel (80061-LIPID) TLB-BMP (Basic Metabolic Panel-BMET) (80048-METABOL) TLB-CBC Platelet - w/Differential (85025-CBCD) TLB-Hepatic/Liver Function Pnl (80076-HEPATIC) TLB-TSH (Thyroid Stimulating Hormone) (84443-TSH) TLB-Udip w/ Micro (81001-URINE) TLB-A1C / Hgb A1C (Glycohemoglobin) (83036-A1C)  Labs Reviewed: BUN: 25 (02/23/2009)   Cr: 1.1 (02/23/2009)    Hgb: 11.2 (02/23/2009)   Hct: 33.4 (02/23/2009)   Ca++: 9.6 (02/23/2009)    TP: 6.8 (02/23/2009)   Alb: 3.5 (02/23/2009)  Problem # 5:  OTHER SPECIFIED ANEMIAS (ICD-285.8) Assessment: Improved  Orders: Venipuncture (91478) TLB-Lipid Panel (80061-LIPID) TLB-BMP (Basic Metabolic Panel-BMET) (80048-METABOL) TLB-CBC Platelet - w/Differential (85025-CBCD) TLB-Hepatic/Liver Function Pnl (80076-HEPATIC) TLB-TSH (Thyroid Stimulating Hormone) (84443-TSH) TLB-Udip w/ Micro (81001-URINE) TLB-A1C / Hgb A1C (Glycohemoglobin) (83036-A1C) Hemoccult Guaiac-1 spec.(in office) (82270)  Hgb: 11.2 (02/23/2009)   Hct: 33.4 (02/23/2009)    Platelets: 218.0 (02/23/2009) RBC: 3.55 (02/23/2009)   RDW: 12.1 (02/23/2009)   WBC: 6.4 (02/23/2009) MCV: 93.9 (02/23/2009)   MCHC: 33.4 (02/23/2009) Iron: 57 (02/23/2009)   % Sat: 18.7 (02/23/2009) B12: 446 (02/23/2009)   Folate: >20.0 ng/mL (02/23/2009)   TSH: 0.42 (02/23/2009)  Problem # 6:  HYPERGLYCEMIA (ICD-790.29) Assessment: Unchanged  Orders: Venipuncture (29562) TLB-Lipid Panel (80061-LIPID) TLB-BMP (Basic Metabolic Panel-BMET) (80048-METABOL) TLB-CBC Platelet - w/Differential (85025-CBCD) TLB-Hepatic/Liver Function Pnl (80076-HEPATIC) TLB-TSH (Thyroid Stimulating Hormone) (84443-TSH) TLB-Udip w/ Micro (81001-URINE) TLB-A1C / Hgb A1C (Glycohemoglobin) (83036-A1C)  Labs Reviewed: Creat: 1.1 (02/23/2009)     Complete Medication List: 1)  Levothroid 50 Mcg Tabs (Levothyroxine sodium) .... Take 1 tablet by mouth once a day 2)  Antabuse 250 Mg Tabs (Disulfiram) .... Take 1 tablet by mouth once a day 3)  Calcium/vitamind  4)  Daily Multivitamin  5)  Folic Acid  6)  Vitamin B12  7)  Prozac 40 Mg Caps (Fluoxetine hcl) .... Take 1 tab once daily 8)  Vitamin D3 1000 Unit Caps (Cholecalciferol) .... Take once daily 9)  Herbal Laxative  .... Once daily as needed  Other Orders: Dermatology Referral (Derma)  Colorectal Screening:  Current Recommendations:    Hemoccult: NEG X 1 today  PAP Screening:    Hx Cervical Dysplasia in last 5 yrs? No    3 normal PAP smears in last 5 yrs?  Yes    Reviewed PAP smear recommendations:  PAP smear done  Osteoporosis Risk Assessment:  Risk Factors for Fracture or Low Bone Density:   Race (White or Asian):     yes   Smoking status:       quit   Thyroid replacement:     yes   Thyroid disease:     yes  Immunization & Chemoprophylaxis:    Tetanus vaccine: done  (01/29/2009)    Influenza vaccine: given  (04/01/2009)  Patient Instructions: 1)  Please schedule a follow-up appointment in 6 months. 2)  It is important that you  exercise regularly at least 20 minutes 5 times a week. If you develop chest pain, have severe difficulty breathing, or feel very tired , stop exercising immediately and seek medical attention. 3)  Schedule your mammogram. 4)  Take calcium +Vitamin D daily. 5)  Check your Blood Pressure regularly. If it is above 140/90: you should make an appointment.  Preventive Care Screening  Last Flu Shot:    Date:  04/01/2009    Results:  given   Last Tetanus Booster:    Date:  01/29/2009    Results:  done   Colonoscopy:    Date:  11/29/2008    Results:  done

## 2010-09-08 NOTE — Progress Notes (Signed)
  Phone Note Refill Request Message from:  Fax from Pharmacy on August 30, 2010 9:49 AM  Refills Requested: Medication #1:  LEVOTHROID 125 MCG TABS One by mouth once daily for thyroid. Initial call taken by: Rock Nephew CMA,  August 30, 2010 9:49 AM    Prescriptions: LEVOTHROID 125 MCG TABS (LEVOTHYROXINE SODIUM) One by mouth once daily for thyroid  #90 x 3   Entered by:   Rock Nephew CMA   Authorized by:   Etta Grandchild MD   Signed by:   Rock Nephew CMA on 08/30/2010   Method used:   Electronically to        [phone]* (retail)       [address]       [place], [place]  [address]       Ph: [phone]       Fax: [phone]   RxID:   [id number]

## 2010-10-11 ENCOUNTER — Other Ambulatory Visit: Payer: Self-pay | Admitting: Internal Medicine

## 2010-10-11 DIAGNOSIS — Z1231 Encounter for screening mammogram for malignant neoplasm of breast: Secondary | ICD-10-CM

## 2010-10-13 ENCOUNTER — Encounter: Payer: Self-pay | Admitting: Internal Medicine

## 2010-10-13 ENCOUNTER — Ambulatory Visit (INDEPENDENT_AMBULATORY_CARE_PROVIDER_SITE_OTHER): Payer: Medicare Other | Admitting: Internal Medicine

## 2010-10-13 ENCOUNTER — Other Ambulatory Visit: Payer: Self-pay | Admitting: Internal Medicine

## 2010-10-13 ENCOUNTER — Other Ambulatory Visit: Payer: Medicare Other

## 2010-10-13 DIAGNOSIS — I10 Essential (primary) hypertension: Secondary | ICD-10-CM

## 2010-10-13 DIAGNOSIS — E039 Hypothyroidism, unspecified: Secondary | ICD-10-CM

## 2010-10-13 DIAGNOSIS — N181 Chronic kidney disease, stage 1: Secondary | ICD-10-CM

## 2010-10-13 DIAGNOSIS — F10229 Alcohol dependence with intoxication, unspecified: Secondary | ICD-10-CM

## 2010-10-13 LAB — BASIC METABOLIC PANEL
CO2: 31 mEq/L (ref 19–32)
GFR: 49.75 mL/min — ABNORMAL LOW (ref >60.00)
Glucose, Bld: 105 mg/dL — ABNORMAL HIGH (ref 70–99)
Potassium: 4.4 mEq/L (ref 3.5–5.1)

## 2010-10-14 LAB — RAPID URINE DRUG SCREEN, HOSP PERFORMED
Barbiturates: NOT DETECTED
Benzodiazepines: POSITIVE — AB
Cocaine: NOT DETECTED

## 2010-10-14 LAB — URINE DRUGS OF ABUSE SCREEN W ALC, ROUTINE (REF LAB)
Amphetamine Screen, Ur: NEGATIVE
Amphetamine Screen, Ur: NEGATIVE
Barbiturate Quant, Ur: NEGATIVE
Benzodiazepines.: NEGATIVE
Benzodiazepines.: NEGATIVE
Cocaine Metabolites: NEGATIVE
Marijuana Metabolite: NEGATIVE
Marijuana Metabolite: NEGATIVE
Methadone: NEGATIVE
Phencyclidine (PCP): NEGATIVE
Propoxyphene: NEGATIVE

## 2010-10-14 LAB — DIFFERENTIAL
Basophils Absolute: 0 10*3/uL (ref 0.0–0.1)
Basophils Relative: 0 % (ref 0–1)
Eosinophils Absolute: 0 10*3/uL (ref 0.0–0.7)
Eosinophils Relative: 0 % (ref 0–5)
Lymphocytes Relative: 22 % (ref 12–46)
Monocytes Absolute: 0.2 10*3/uL (ref 0.1–1.0)
Neutro Abs: 3.3 10*3/uL (ref 1.7–7.7)

## 2010-10-14 LAB — URINE MICROSCOPIC-ADD ON

## 2010-10-14 LAB — URINALYSIS, ROUTINE W REFLEX MICROSCOPIC
Glucose, UA: NEGATIVE mg/dL
Specific Gravity, Urine: 1.025 (ref 1.005–1.030)
Urobilinogen, UA: 0.2 mg/dL (ref 0.0–1.0)

## 2010-10-14 LAB — CBC
MCV: 92.8 fL (ref 78.0–100.0)
Platelets: 156 10*3/uL (ref 150–400)

## 2010-10-14 LAB — TSH: TSH: 24.008 u[IU]/mL — ABNORMAL HIGH (ref 0.350–4.500)

## 2010-10-14 LAB — COMPREHENSIVE METABOLIC PANEL
AST: 172 U/L — ABNORMAL HIGH (ref 0–37)
Alkaline Phosphatase: 79 U/L (ref 39–117)
Calcium: 8.9 mg/dL (ref 8.4–10.5)
Creatinine, Ser: 1 mg/dL (ref 0.4–1.2)
Glucose, Bld: 85 mg/dL (ref 70–99)
Potassium: 4.3 mEq/L (ref 3.5–5.1)
Total Bilirubin: 0.6 mg/dL (ref 0.3–1.2)
Total Protein: 7 g/dL (ref 6.0–8.3)

## 2010-10-14 LAB — ETHANOL CONFIRM, URINE: Ethanol, Ur - Confirmation: 0.046 GMS% — ABNORMAL HIGH (ref ?–0.04)

## 2010-10-15 LAB — BASIC METABOLIC PANEL
BUN: 10 mg/dL (ref 6–23)
BUN: 12 mg/dL (ref 6–23)
CO2: 21 mEq/L (ref 19–32)
CO2: 29 mEq/L (ref 19–32)
Calcium: 9.1 mg/dL (ref 8.4–10.5)
Calcium: 9.5 mg/dL (ref 8.4–10.5)
Creatinine, Ser: 0.94 mg/dL (ref 0.4–1.2)
GFR calc Af Amer: >60 mL/min (ref >60)
GFR calc non Af Amer: >60 mL/min (ref >60)

## 2010-10-15 LAB — HEPATIC FUNCTION PANEL
ALT: 20 U/L (ref 0–35)
AST: 44 U/L — ABNORMAL HIGH (ref 0–37)
Alkaline Phosphatase: 50 U/L (ref 39–117)
Bilirubin, Direct: 0.1 mg/dL (ref 0.0–0.3)
Total Protein: 7.4 g/dL (ref 6.0–8.3)

## 2010-10-15 LAB — DIFFERENTIAL
Basophils Absolute: 0 10*3/uL (ref 0.0–0.1)
Basophils Absolute: 0 10*3/uL (ref 0.0–0.1)
Basophils Relative: 1 % (ref 0–1)
Eosinophils Absolute: 0 10*3/uL (ref 0.0–0.7)
Eosinophils Absolute: 0 10*3/uL (ref 0.0–0.7)
Eosinophils Relative: 1 % (ref 0–5)
Neutrophils Relative %: 56 % (ref 43–77)
Neutrophils Relative %: 58 % (ref 43–77)

## 2010-10-15 LAB — CBC
HCT: 40.1 % (ref 36.0–46.0)
Hemoglobin: 13.7 g/dL (ref 12.0–15.0)
MCH: 31.7 pg (ref 26.0–34.0)
MCH: 31.7 pg (ref 26.0–34.0)
MCHC: 34.2 g/dL (ref 30.0–36.0)
MCHC: 34.4 g/dL (ref 30.0–36.0)
MCV: 92.9 fL (ref 78.0–100.0)
Platelets: 199 10*3/uL (ref 150–400)
RBC: 3.95 MIL/uL (ref 3.87–5.11)
RDW: 14.2 % (ref 11.5–15.5)

## 2010-10-15 LAB — RAPID URINE DRUG SCREEN, HOSP PERFORMED
Amphetamines: NOT DETECTED
Amphetamines: NOT DETECTED
Benzodiazepines: POSITIVE — AB
Opiates: NOT DETECTED
Tetrahydrocannabinol: NOT DETECTED
Tetrahydrocannabinol: NOT DETECTED

## 2010-10-15 LAB — ETHANOL: Alcohol, Ethyl (B): 268 mg/dL — ABNORMAL HIGH (ref 0–10)

## 2010-10-19 LAB — URINALYSIS, ROUTINE W REFLEX MICROSCOPIC
Bilirubin Urine: NEGATIVE
Glucose, UA: NEGATIVE mg/dL
Ketones, ur: NEGATIVE mg/dL
Leukocytes, UA: NEGATIVE
Nitrite: NEGATIVE
Protein, ur: 30 mg/dL — AB
Specific Gravity, Urine: 1.011 (ref 1.005–1.030)
Urobilinogen, UA: 0.2 mg/dL (ref 0.0–1.0)
pH: 7.5 (ref 5.0–8.0)

## 2010-10-19 LAB — DIFFERENTIAL
Basophils Absolute: 0 10*3/uL (ref 0.0–0.1)
Basophils Relative: 1 % (ref 0–1)
Eosinophils Absolute: 0.1 K/uL (ref 0.0–0.7)
Eosinophils Relative: 1 % (ref 0–5)
Lymphocytes Relative: 36 % (ref 12–46)
Lymphs Abs: 2.3 K/uL (ref 0.7–4.0)
Monocytes Absolute: 0.4 10*3/uL (ref 0.1–1.0)
Monocytes Relative: 6 % (ref 3–12)
Neutro Abs: 3.6 10*3/uL (ref 1.7–7.7)
Neutrophils Relative %: 56 % (ref 43–77)

## 2010-10-19 LAB — URINE MICROSCOPIC-ADD ON

## 2010-10-19 LAB — CBC
HCT: 33.5 % — ABNORMAL LOW (ref 36.0–46.0)
Hemoglobin: 11.9 g/dL — ABNORMAL LOW (ref 12.0–15.0)
MCHC: 35.7 g/dL (ref 30.0–36.0)
MCV: 96 fL (ref 78.0–100.0)
Platelets: 184 K/uL (ref 150–400)
RBC: 3.49 MIL/uL — ABNORMAL LOW (ref 3.87–5.11)
RDW: 13.3 % (ref 11.5–15.5)
WBC: 6.5 K/uL (ref 4.0–10.5)

## 2010-10-19 LAB — POCT I-STAT, CHEM 8
BUN: 9 mg/dL (ref 6–23)
Calcium, Ion: 1.07 mmol/L — ABNORMAL LOW (ref 1.12–1.32)
Chloride: 106 meq/L (ref 96–112)
Creatinine, Ser: 1.2 mg/dL (ref 0.4–1.2)
Glucose, Bld: 87 mg/dL (ref 70–99)
HCT: 36 % (ref 36.0–46.0)
Hemoglobin: 12.2 g/dL (ref 12.0–15.0)
Potassium: 4.1 mEq/L (ref 3.5–5.1)
Sodium: 144 mEq/L (ref 135–145)
TCO2: 25 mmol/L (ref 0–100)

## 2010-10-19 LAB — RAPID URINE DRUG SCREEN, HOSP PERFORMED
Barbiturates: NOT DETECTED
Opiates: NOT DETECTED

## 2010-10-19 NOTE — Assessment & Plan Note (Signed)
Summary: PER PT FU   MED   STC   Vital Signs:  Patient profile:   65 year old female Menstrual status:  postmenopausal Height:      67 inches (170.18 cm) Weight:      137.50 pounds (62.50 kg) BMI:     21.61 O2 Sat:      98 % on Room air Temp:     98.3 degrees F (36.83 degrees C) oral Pulse rate:   74 / minute Pulse rhythm:   regular Resp:     14 per minute BP sitting:   136 / 64  (left arm) Cuff size:   regular  Vitals Entered By: Burnard Leigh CMA(AAMA) (October 13, 2010 2:33 PM)  O2 Flow:  Room air CC: Discuss Meds.Pt is on Medicare now/sls,cma Is Patient Diabetic? No Pain Assessment Patient in pain? no      Comments Pt states she no longer takes Prozac-no benefit.     Menstrual Status postmenopausal Last PAP Result NEGATIVE FOR INTRAEPITHELIAL LESIONS OR MALIGNANCY.   Primary Care Provider:  Etta Grandchild MD  CC:  Discuss Meds.Pt is on Medicare now/sls and cma.  History of Present Illness:  Follow-Up Visit      This is a 65 year old woman who presents for Follow-up visit.  The patient denies chest pain, palpitations, dizziness, syncope, edema, SOB, DOE, PND, and orthopnea.  Since the last visit the patient notes no new problems or concerns.  The patient reports taking meds as prescribed and not monitoring BP.  When questioned about possible medication side effects, the patient notes none.    Preventive Screening-Counseling & Management  Alcohol-Tobacco     Alcohol drinks/day: 0     Alcohol type: wine     >5/day in last 3 mos: no     Alcohol Counseling: to STOP drinking     Feels need to cut down: yes     Feels annoyed by complaints: yes     Feels guilty re: drinking: yes     Needs 'eye opener' in am: yes     Smoking Status: quit     Tobacco Counseling: to remain off tobacco products  Hep-HIV-STD-Contraception     Hepatitis Risk: no risk noted     HIV Risk: no risk noted     STD Risk: no risk noted      Drug Use:  no.    Clinical Review  Panels:  Prevention   Last Mammogram:  ASSESSMENT: Negative - BI-RADS 1^MM DIGITAL SCREENING (11/10/2009)   Last Pap Smear:  NEGATIVE FOR INTRAEPITHELIAL LESIONS OR MALIGNANCY. (10/19/2009)   Last Colonoscopy:  done (11/29/2008)  Immunizations   Last Tetanus Booster:  done (01/29/2009)   Last Flu Vaccine:  given (04/01/2009)  Lipid Management   Cholesterol:  241 (10/19/2009)   HDL (good cholesterol):  87.20 (10/19/2009)  Diabetes Management   HgBA1C:  5.7 (10/19/2009)   Creatinine:  1.4 (10/19/2009)   Last Flu Vaccine:  given (04/01/2009)  CBC   WBC:  7.4 (10/19/2009)   RBC:  3.79 (10/19/2009)   Hgb:  12.1 (10/19/2009)   Hct:  36.5 (10/19/2009)   Platelets:  206.0 (10/19/2009)   MCV  96.3 (10/19/2009)   MCHC  33.3 (10/19/2009)   RDW  12.2 (10/19/2009)   PMN:  70.5 (10/19/2009)   Lymphs:  21.6 (10/19/2009)   Monos:  5.7 (10/19/2009)   Eosinophils:  1.4 (10/19/2009)   Basophil:  0.8 (10/19/2009)  Complete Metabolic Panel   Glucose:  89 (  10/19/2009)   Sodium:  141 (10/19/2009)   Potassium:  4.5 (10/19/2009)   Chloride:  104 (10/19/2009)   CO2:  29 (10/19/2009)   BUN:  29 (10/19/2009)   Creatinine:  1.4 (10/19/2009)   Albumin:  4.0 (10/30/2009)   Total Protein:  6.8 (10/30/2009)   Calcium:  9.5 (10/19/2009)   Total Bili:  0.3 (10/30/2009)   Alk Phos:  46 (10/30/2009)   SGPT (ALT):  91 (10/30/2009)   SGOT (AST):  54 (10/30/2009)   Current Medications (verified): 1)  Calcium/vitamind 2)  Daily Multivitamin 3)  Vitamin B12 4)  Prozac 40 Mg Caps (Fluoxetine Hcl) .... Take 1 Tab Once Daily 5)  Herbal Laxative .... Once Daily As Needed 6)  Levothroid 125 Mcg Tabs (Levothyroxine Sodium) .... One By Mouth Once Daily For Thyroid  Allergies (verified): 1)  ! Ace Inhibitors  Past History:  Past Medical History: Last updated: 02/23/2009 Hypothyroidism Hypertension  Past Surgical History: Last updated: 02/23/2009 Tonsillectomy  Family History: Last updated:  10/19/2009 Family History Breast cancer 1st degree relative <50  Social History: Last updated: 10/19/2009 Retired Single Alcohol use-no Drug use-no Regular exercise-yes  Risk Factors: Alcohol Use: 0 (10/13/2010) >5 drinks/d w/in last 3 months: no (10/13/2010) Exercise: yes (10/19/2009)  Risk Factors: Smoking Status: quit (10/13/2010)  Family History: Reviewed history from 10/19/2009 and no changes required. Family History Breast cancer 1st degree relative <50  Social History: Reviewed history from 10/19/2009 and no changes required. Retired Single Alcohol use-no Drug use-no Regular exercise-yes  Review of Systems  The patient denies anorexia, fever, weight loss, weight gain, chest pain, syncope, dyspnea on exertion, peripheral edema, prolonged cough, headaches, hemoptysis, abdominal pain, hematuria, suspicious skin lesions, transient blindness, difficulty walking, depression, abnormal bleeding, enlarged lymph nodes, and angioedema.   Psych:  Denies anxiety, depression, easily angered, easily tearful, irritability, mental problems, panic attacks, sense of great danger, suicidal thoughts/plans, and thoughts of violence. Endo:  Denies cold intolerance, excessive hunger, excessive thirst, excessive urination, heat intolerance, polyuria, and weight change.  Physical Exam  General:  alert, well-developed, well-nourished, well-hydrated, appropriate dress, normal appearance, healthy-appearing, cooperative to examination, and good hygiene.   Head:  normocephalic, atraumatic, no abnormalities observed, and no abnormalities palpated.   Eyes:  vision grossly intact and pupils equal.   Mouth:  Oral mucosa and oropharynx without lesions or exudates.  Teeth in good repair. Neck:  supple, full ROM, no masses, no thyromegaly, and no thyroid nodules or tenderness.   Lungs:  Normal respiratory effort, chest expands symmetrically. Lungs are clear to auscultation, no crackles or  wheezes. Heart:  Normal rate and regular rhythm. S1 and S2 normal without gallop, murmur, click, rub or other extra sounds. Abdomen:  Bowel sounds positive,abdomen soft and non-tender without masses, organomegaly or hernias noted. Msk:  No deformity or scoliosis noted of thoracic or lumbar spine.   Pulses:  R and L carotid,radial,femoral,dorsalis pedis and posterior tibial pulses are full and equal bilaterally Extremities:  No clubbing, cyanosis, edema, or deformity noted with normal full range of motion of all joints.   Neurologic:  No cranial nerve deficits noted. Station and gait are normal. Plantar reflexes are down-going bilaterally. DTRs are symmetrical throughout. Sensory, motor and coordinative functions appear intact. Skin:  Intact without suspicious lesions or rashes Psych:  Oriented X3, memory intact for recent and remote, normally interactive, good eye contact, not anxious appearing, not depressed appearing, not agitated, not suicidal, and not homicidal.     Impression & Recommendations:  Problem #  1:  ACUT ALCOHOLIC INTOXICATION EPISODIC DRUNKENNESS (ICD-303.02) Assessment Improved  Problem # 2:  HYPERTENSION (ICD-401.9) Assessment: Improved  BP today: 136/64 Prior BP: 120/68 (03/09/2010)  Prior 10 Yr Risk Heart Disease: Not enough information (02/23/2009)  Labs Reviewed: K+: 4.5 (10/19/2009) Creat: : 1.4 (10/19/2009)   Chol: 241 (10/19/2009)   HDL: 87.20 (10/19/2009)   TG: 40.0 (10/19/2009)  Problem # 3:  HYPOTHYROIDISM (ICD-244.9) Assessment: Unchanged  Her updated medication list for this problem includes:    Levothroid 125 Mcg Tabs (Levothyroxine sodium) ..... One by mouth once daily for thyroid  Orders: TLB-TSH (Thyroid Stimulating Hormone) (84443-TSH)  Labs Reviewed: TSH: 55.07 (10/19/2009)    HgBA1c: 5.7 (10/19/2009) Chol: 241 (10/19/2009)   HDL: 87.20 (10/19/2009)   TG: 40.0 (10/19/2009)  Problem # 4:  KIDNEY DISEASE, CHRONIC, STAGE I  (ICD-585.1) Assessment: Unchanged  Orders: TLB-BMP (Basic Metabolic Panel-BMET) (80048-METABOL)  Labs Reviewed: BUN: 29 (10/19/2009)   Cr: 1.4 (10/19/2009)    Hgb: 12.1 (10/19/2009)   Hct: 36.5 (10/19/2009)   Ca++: 9.5 (10/19/2009)    TP: 6.8 (10/30/2009)   Alb: 4.0 (10/30/2009) HBSAg: NEG (10/30/2009)     Complete Medication List: 1)  Calcium/vitamind  2)  Daily Multivitamin  3)  Vitamin B12  4)  Prozac 40 Mg Caps (Fluoxetine hcl) .... Take 1 tab once daily 5)  Herbal Laxative  .... Once daily as needed 6)  Levothroid 125 Mcg Tabs (Levothyroxine sodium) .... One by mouth once daily for thyroid  Patient Instructions: 1)  Please schedule a follow-up appointment in 6 months. Prescriptions: LEVOTHROID 125 MCG TABS (LEVOTHYROXINE SODIUM) One by mouth once daily for thyroid  #90 x 3   Entered and Authorized by:   Etta Grandchild MD   Signed by:   Etta Grandchild MD on 10/13/2010   Method used:   Print then Give to Patient   RxID:   [id number]    Orders Added: 1)  TLB-TSH (Thyroid Stimulating Hormone) [84443-TSH] 2)  TLB-BMP (Basic Metabolic Panel-BMET) [80048-METABOL] 3)  Est. Patient Level III [44034]

## 2010-11-07 LAB — URINALYSIS, ROUTINE W REFLEX MICROSCOPIC
Glucose, UA: NEGATIVE mg/dL
Ketones, ur: 15 mg/dL — AB
Nitrite: NEGATIVE
pH: 5.5 (ref 5.0–8.0)

## 2010-11-07 LAB — URINE CULTURE: Colony Count: >=100000

## 2010-11-07 LAB — URINE MICROSCOPIC-ADD ON

## 2010-11-07 LAB — BASIC METABOLIC PANEL
BUN: 24 mg/dL — ABNORMAL HIGH (ref 6–23)
Chloride: 103 mEq/L (ref 96–112)
GFR calc non Af Amer: 38 mL/min — ABNORMAL LOW (ref >60)
Potassium: 4 mEq/L (ref 3.5–5.1)
Sodium: 139 mEq/L (ref 135–145)

## 2010-11-07 LAB — CBC
HCT: 34.9 % — ABNORMAL LOW (ref 36.0–46.0)
Hemoglobin: 11.6 g/dL — ABNORMAL LOW (ref 12.0–15.0)
MCV: 94.3 fL (ref 78.0–100.0)
RBC: 3.7 MIL/uL — ABNORMAL LOW (ref 3.87–5.11)
WBC: 7.1 10*3/uL (ref 4.0–10.5)

## 2010-11-07 LAB — DIFFERENTIAL
Eosinophils Absolute: 0.1 10*3/uL (ref 0.0–0.7)
Eosinophils Relative: 2 % (ref 0–5)
Lymphocytes Relative: 15 % (ref 12–46)
Lymphs Abs: 1.1 10*3/uL (ref 0.7–4.0)
Monocytes Absolute: 0.5 10*3/uL (ref 0.1–1.0)
Monocytes Relative: 7 % (ref 3–12)

## 2010-11-07 LAB — POCT CARDIAC MARKERS: Troponin i, poc: <0.05 ng/mL (ref 0.00–0.09)

## 2010-11-15 ENCOUNTER — Ambulatory Visit: Payer: Self-pay

## 2010-11-17 ENCOUNTER — Ambulatory Visit: Payer: Self-pay

## 2011-01-08 ENCOUNTER — Observation Stay (HOSPITAL_COMMUNITY)
Admission: EM | Admit: 2011-01-08 | Discharge: 2011-01-12 | Disposition: A | Discharge status: Home / Self Care | Payer: Medicare Other | Attending: Family Medicine | Admitting: Family Medicine

## 2011-01-08 ENCOUNTER — Emergency Department (HOSPITAL_COMMUNITY): Payer: Medicare Other

## 2011-01-08 DIAGNOSIS — F1020 Alcohol dependence, uncomplicated: Principal | ICD-10-CM | POA: Insufficient documentation

## 2011-01-08 DIAGNOSIS — K5289 Other specified noninfective gastroenteritis and colitis: Secondary | ICD-10-CM | POA: Insufficient documentation

## 2011-01-08 DIAGNOSIS — R079 Chest pain, unspecified: Secondary | ICD-10-CM | POA: Insufficient documentation

## 2011-01-08 DIAGNOSIS — E039 Hypothyroidism, unspecified: Secondary | ICD-10-CM | POA: Insufficient documentation

## 2011-01-08 DIAGNOSIS — F329 Major depressive disorder, single episode, unspecified: Secondary | ICD-10-CM | POA: Insufficient documentation

## 2011-01-08 DIAGNOSIS — F3289 Other specified depressive episodes: Secondary | ICD-10-CM | POA: Insufficient documentation

## 2011-01-08 DIAGNOSIS — R112 Nausea with vomiting, unspecified: Secondary | ICD-10-CM | POA: Insufficient documentation

## 2011-01-08 DIAGNOSIS — R42 Dizziness and giddiness: Secondary | ICD-10-CM | POA: Insufficient documentation

## 2011-01-08 DIAGNOSIS — E876 Hypokalemia: Secondary | ICD-10-CM | POA: Insufficient documentation

## 2011-01-08 DIAGNOSIS — Z79899 Other long term (current) drug therapy: Secondary | ICD-10-CM | POA: Insufficient documentation

## 2011-01-08 LAB — URINALYSIS, ROUTINE W REFLEX MICROSCOPIC
Glucose, UA: NEGATIVE mg/dL
Ketones, ur: NEGATIVE mg/dL
Leukocytes, UA: NEGATIVE
Nitrite: NEGATIVE
Specific Gravity, Urine: 1.012 (ref 1.005–1.030)
pH: 7 (ref 5.0–8.0)

## 2011-01-08 LAB — DIFFERENTIAL
Basophils Relative: 1 % (ref 0–1)
Eosinophils Absolute: 0.1 10*3/uL (ref 0.0–0.7)
Lymphs Abs: 2.1 10*3/uL (ref 0.7–4.0)
Neutro Abs: 4.6 10*3/uL (ref 1.7–7.7)
Neutrophils Relative %: 64 % (ref 43–77)

## 2011-01-08 LAB — TROPONIN I: Troponin I: <0.3 ng/mL (ref <0.30)

## 2011-01-08 LAB — RAPID URINE DRUG SCREEN, HOSP PERFORMED
Amphetamines: NOT DETECTED
Benzodiazepines: NOT DETECTED
Cocaine: NOT DETECTED

## 2011-01-08 LAB — COMPREHENSIVE METABOLIC PANEL
ALT: 15 U/L (ref 0–35)
AST: 26 U/L (ref 0–37)
Albumin: 3.2 g/dL — ABNORMAL LOW (ref 3.5–5.2)
CO2: 29 mEq/L (ref 19–32)
Chloride: 94 mEq/L — ABNORMAL LOW (ref 96–112)
GFR calc non Af Amer: 56 mL/min — ABNORMAL LOW (ref >60)
Sodium: 134 mEq/L — ABNORMAL LOW (ref 135–145)
Total Bilirubin: 0.3 mg/dL (ref 0.3–1.2)

## 2011-01-08 LAB — CBC
Hemoglobin: 13 g/dL (ref 12.0–15.0)
Platelets: 203 10*3/uL (ref 150–400)
RBC: 4.25 MIL/uL (ref 3.87–5.11)
WBC: 7.2 10*3/uL (ref 4.0–10.5)

## 2011-01-08 LAB — CK TOTAL AND CKMB (NOT AT ARMC)
Relative Index: INVALID (ref 0.0–2.5)
Total CK: 65 U/L (ref 7–177)

## 2011-01-09 LAB — TROPONIN I: Troponin I: <0.3 ng/mL (ref <0.30)

## 2011-01-09 LAB — ETHANOL: Alcohol, Ethyl (B): 113 mg/dL — ABNORMAL HIGH (ref 0–10)

## 2011-01-09 LAB — CK TOTAL AND CKMB (NOT AT ARMC): Total CK: 62 U/L (ref 7–177)

## 2011-01-09 LAB — OCCULT BLOOD, POC DEVICE: Fecal Occult Bld: NEGATIVE

## 2011-01-10 DIAGNOSIS — F4320 Adjustment disorder, unspecified: Secondary | ICD-10-CM

## 2011-01-10 LAB — MAGNESIUM: Magnesium: 1.9 mg/dL (ref 1.5–2.5)

## 2011-01-10 LAB — POTASSIUM: Potassium: 4.4 mEq/L (ref 3.5–5.1)

## 2011-01-11 DIAGNOSIS — F4320 Adjustment disorder, unspecified: Secondary | ICD-10-CM

## 2011-01-11 LAB — TSH: TSH: 6.249 u[IU]/mL — ABNORMAL HIGH (ref 0.350–4.500)

## 2011-01-12 LAB — BASIC METABOLIC PANEL
BUN: 26 mg/dL — ABNORMAL HIGH (ref 6–23)
Calcium: 9.4 mg/dL (ref 8.4–10.5)
Creatinine, Ser: 0.91 mg/dL (ref 0.4–1.2)
GFR calc Af Amer: >60 mL/min (ref >60)
GFR calc non Af Amer: >60 mL/min (ref >60)
Glucose, Bld: 85 mg/dL (ref 70–99)
Potassium: 4.4 mEq/L (ref 3.5–5.1)

## 2011-01-14 NOTE — Discharge Summary (Signed)
NAMEMarland Graves  Mary, Graves NO.:  [id number]  MEDICAL RECORD NO.:  [id number]  LOCATION:  1515                         FACILITY:  [facility]  PHYSICIAN:  Brendia Sacks, MD    DATE OF BIRTH:  10/23/1945  DATE OF ADMISSION:  01/08/2011 DATE OF DISCHARGE:  01/12/2011                              DISCHARGE SUMMARY   CONDITION ON DISCHARGE:  Improved.  DISPOSITION:  Home.  DISCHARGE DIAGNOSES: 1. Acute alcohol intoxication. 2. Alcohol abuse. 3. Hypertension, stable. 4. Gastroenteritis, resolved. 5. Hypothyroidism, stable.  HISTORY OF PRESENT ILLNESS:  This is a 65 year old woman who presented to the emergency room on January 08, 2011, initially with symptoms consistent with alcoholic gastritis.  She was initially cleared for discharge but by the emergency room staff, however, prior to discharge she requested psychiatric evaluation for rehab.  She was seen in consultation with Dr. Rogers Blocker of Psychiatry who recommended medical admission for consideration of medical stability and then transfer to inpatient facility as the patient desired.  HOSPITAL COURSE: 1. Acute alcohol intoxication.  This resolved with supportive care. 2. Alcohol abuse.  The patient has been seen in consultation with     Psychiatry.  She has also been followed by the psychiatric clinical     social worker.  The patient at this point has been provided     community resources on inpatient and outpatient facilities.  She     would prefer to make her own arrangements.  She has already been     pursuing this and considering a facility in [place].  The patient     has been cleared by Psychiatry for discharge home today and I have     discussed the case with Psychiatry, social workers help to     coordinate the patient's care.  At this point, the patient is     stable for discharge.  She shows no signs of withdrawal at this     point. 3. Hypertension.  This appears to be a new diagnosis for her.  We  will     start her on hydrochlorothiazide and have her follow up in the     outpatient setting. 4. Gastroenteritis.  This appears to have resolved.  C difficile toxin     was negative. 5. Hypothyroidism.  TSH was mildly elevated, however, T4 was within     normal limits.  We will make no adjustments at this time and have     her follow up with primary care physician.  CONSULTATIONS:  Psychiatry recommendations as above, specifically, the patient is cleared for discharge home.  PROCEDURES:  None.  IMAGING: 1. Chest x-ray on January 08, 2011:  No evidence for acute pulmonary     disease. 2. CT of the head on January 08, 2011:  No evidence of acute intracranial     hemorrhage, mass lesion or acute infarct.  MICROBIOLOGY:  None.  PERTINENT LABORATORY STUDIES: 1. Serum alcohol was 302 on presentation 2. Lipase within normal limits. 3. Urine drug screen was negative. 4. TSH mildly elevated 6.249, free T4 within normal limits. 5. C difficile toxin was negative by PCR. 6. CBC was within normal limits. 7. Basic  metabolic panel was unremarkable on discharge, notable for     potassium of 2.8 on admission. 8. Cardiac enzymes were negative.  DISCHARGE EXAMINATION:  GENERAL:  The patient is feeling well.  No complaints.  She is ready go home. VITAL SIGNS:  Temperature 98.0, pulse 64, respirations 18, blood pressure 155/74,  sat 98% on room air, well appearing, nontoxic quite calm.  No evidence of withdrawal. CARDIOVASCULAR:  Regular rate and rhythm.  No murmur, rub or gallop. RESPIRATORY:  Clear to auscultation bilaterally.  No wheezes, rales or rhonchi.  Normal respiratory effort.  DISCHARGE INSTRUCTIONS:  The patient will be discharged home.  She can follow up with her primary care physician in 2 to 4 weeks.  She has been given resources for inpatient and outpatient rehab of choice.  She is also considering Alcoholics Anonymous, although she has had some frustrations with this program in  the past.  ACTIVITIES:  Unrestricted.  DIET:  Unrestricted.  DISCHARGE MEDICATIONS: 1. Folic acid 1 mg p.o. daily. 2. Hydrochlorothiazide 25 mg p.o. daily. 3. Multivitamin p.o. daily. 4. Thiamine 100 mg p.o. daily. 5. Levothyroxine 125 mcg p.o. daily.  Time coordinating discharge is 25 minutes.     Brendia Sacks, MD     DG/MEDQ  D:  01/12/2011  T:  01/12/2011  Job:  [id number]  Electronically Signed by Brendia Sacks  on 01/14/2011 05:33:02 PM

## 2011-01-18 NOTE — Consult Note (Signed)
  NAMEKELDA, Mary Graves               ACCOUNT NO.:  [id number]  MEDICAL RECORD NO.:  [id number]  LOCATION:  1515                         FACILITY:  [facility]  PHYSICIAN:  Eulogio Ditch, MD DATE OF BIRTH:  [birth year]  DATE OF CONSULTATION:  01/10/2011 DATE OF DISCHARGE:                                CONSULTATION   REASON FOR CONSULTATION:  Alcohol dependence.  HISTORY OF PRESENT ILLNESS:  The patient is a 65 year old female with a history of chronic alcohol abuse.  The patient reported that she consumes half a galloon of alcohol daily and generalyl she binges on the alcohol.  Currently the patient is having tremors and nausea along with the headaches.  The patient reported that she is drinking alcohol for the last 1-1/2 months continuously and she is looking for detox and rehab.  CURRENT PSYCH MEDICATIONS:  None.  MEDICAL HISTORY:  History of Graves disease, hypertension, history of bulimia nervosa.  MEDICATION:  The patient is on levothyroxine 125 mcg daily.  ALLERGIES:  Allergic to ACE INHIBITORS.  MENTAL STATUS EXAM:  The patient is calm, cooperative during the interview.  Fair eye contact.  The patient has tremors in both hands. Mood depressed and anxious.  Affect mood congruent.  The patient is logical and goal directed during the interview, not hallucinating, not delusional, not internally preoccupied, not suicidal or homicidal. Cognition; alert, awake, oriented x3.  Memory; immediate, recent, remote fair.  Attention and concentration fair.  Abstraction ability good. Insight and judgment intact.  DIAGNOSIS:  AXIS I:  Chronic alcohol dependence, depressive disorder, not otherwise specified. AXIS II:  Deferred. AXIS III:  See medical notes. AXIS IV:  Chronic alcohol abuse. AXIS V:  40 to 50  RECOMMENDATIONS: 1. At this time the patient can be continued on CIWA protocol. 2. The patient wants to go to rehab, so I will speak with the clinical     social worker to  look for A rehab facility for     the patient. 3. I will follow up on this patient as needed.  Thanks for involving me in taking care of this patient.     Eulogio Ditch, MD     SA/MEDQ  D:  01/10/2011  T:  01/10/2011  Job:  [id number]  Electronically Signed by Eulogio Ditch  on 01/18/2011 09:56:10 AM

## 2011-01-24 NOTE — H&P (Signed)
  NAME:  Mary Graves, Mary Graves NO.:  [id number]  MEDICAL RECORD NO.:  [id number]  LOCATION:  WLED                         FACILITY:  [facility]  PHYSICIAN:  Kathlen Mody, MD       DATE OF BIRTH:  [birth year]  DATE OF ADMISSION:  01/08/2011 DATE OF DISCHARGE:                             HISTORY & PHYSICAL   CHIEF COMPLAINT:  Came into [facility] ED on January 08, 2011, with complaints of nausea, vomiting and dizziness.  HISTORY OF PRESENT ILLNESS:  64 year old lady with history of hypothyroidism, depression, came in on the morning of January 08, 2011, complaining of dizziness associated with nausea, vomiting, abdominal discomfort and loose bowel movements since 1 day.  The patient also did report binge drinking and she consumed about half a gallon of alcohol the night before.  The patient has been in the [facility] up until January 10, 2011, when she was evaluated by Psychiatry for possible [facility] admit for detox.  Dr. Ahluwalia/Psychiatry saw the patient this morning, cleared her for detox, but the patient did not want to go to detox for financial reasons.  The patient denies any hallucinations or delusions. The patient denies any headache, blurry vision, nausea, vomiting or any loose bowel movements in the last 24 to 36 hours.  She denies any abdominal pain.  Denies any chest pain, trouble breathing, fever, shortness of breath or syncope.  The patient denies any seizures or DTs while she was in the ED or in the past.  The patient was able to eat all her meals without any difficulty, without any nausea, vomiting or abdominal pain.  The patient is basically being admitted under observation for possible detox placement at a facility.  REVIEW OF SYSTEMS:  See HPI, otherwise negative.  PAST MEDICAL HISTORY: 1. History of alcohol intoxication/abuse. 2. Graves' disease, status post radioactive iodine x2. 3. Acquired hypothyroidism. 4. Bulimia.  PAST SURGICAL HISTORY:  Laparotomy,  laparoscopy, tonsillectomy, cesarean section, left knee arthroscopy.  SOCIAL HISTORY:  The patient lives at home by herself.  Denies smoking. Drinks about half a gallon of alcohol every day.  ALLERGIES:  The patient is allergic to ACE INHIBITORS.  HOME MEDICATIONS:  She is on levothyroxine 125 mcg daily.  PHYSICAL EXAMINATION:  VITAL SIGNS:  Temperature of 98.3, pulse of 68, respirations 16, saturating 100% on room air, blood pressure 145/70. GENERAL:  She is alert, afebrile, oriented x3, comfortable in no acute distress. HEENT:  Atraumatic, normocephalic.  Pupils equal and reacting to light. CARDIOVASCULAR:  S1, S2 heard. RESPIRATORY:  Chest clear to auscultation bilaterally. ABDOMEN:  Soft, nontender, nondistended. EXTREMITIES:  No pedal edema. NEUROLOGIC:  The patient is able to move all her extremities.  She is oriented x3.  No focal deficits.  PERTINENT LABS:  ** DICTATION ENDS HERE **          ______________________________ Kathlen Mody, MD     VA/MEDQ  D:  01/10/2011  T:  01/10/2011  Job:  [id number]  Electronically Signed by Kathlen Mody MD on 01/24/2011 02:26:53 AM

## 2011-01-24 NOTE — H&P (Signed)
  NAME:  Mary Graves, MONCIVAIS NO.:  [id number]  MEDICAL RECORD NO.:  [id number]  LOCATION:  WLED                         FACILITY:  [facility]  PHYSICIAN:  Kathlen Mody, MD       DATE OF BIRTH:  [birth year]  DATE OF ADMISSION:  01/08/2011 DATE OF DISCHARGE:                             HISTORY & PHYSICAL   ADDENDUM: This is a continuation to the dictation with the job number [id number].  The patient's pertinent labs include a CBC that was done on January 08, 2011 which was within normal limits.  Troponin and CK-MB were within normal limits.  Comprehensive metabolic panel significant for a potassium of 2.8, sodium of 134, albumin to 3.2.  Alcohol level on January 08, 2011, was 302.  Lipase was 34.  Urinalysis negative for nitrites and leukocytes. Urine drug screen did not show any acetamines, benzodiazepine, cocaine, opiates.  Next set of enzymes were negative.  Alcohol level on January 09, 2011, was 113.  Point-of-care occult blood was negative.  CT head without contrast that was done on January 08, 2011, shows no evidence of acute intracranial hemorrhage, mass lesion or acute infarct. Chest x-ray shows no evidence of active pulmonary disease.  ASSESSMENT AND PLAN:  This is a 65 year old lady with history of hypothyroidism, alcohol intoxication, depression, who came into the ER complaining of dizziness, nausea, vomiting and alcohol intoxication. She was in [facility] ED for few days and she was being admitted to hospitalist service for observation and possible transfer to [facility] after evaluation by Psychiatry. 1. Alcohol intoxication.  The patient will be put on CIWA protocol.     The patient at this time denies any hallucinations, delirium, DT,     or  tremors.  Psychiatry was already consulted for evaluation of     the patient.  Social worker consult will also be called for     possible detox placement at this facility. 2. Hypokalemia.  On admission the patient's potassium was 2.8.   The     patient at this time denies any symptoms.  Her potassium was     repleted with IV potassium and p.o. potassium while in the ER.     Will get a repeat BMP and replete electrolytes as needed. 3. Hypothyroidism.  Will get a TSH and continue with her levothyroxine     of 125 mcg daily. 4. Depression.  The patient denies any suicidal ideation, suicidal     intent or any attempts in the past or now.  The patient is not on     any antidepressants and once again Psychiatry to evaluate her for     possible antidepressive medications. 5. For DVT prophylaxis subcu Lovenox. 6. The patient is full code.          ______________________________ Kathlen Mody, MD    VA/MEDQ  D:  01/10/2011  T:  01/10/2011  Job:  [id number]  Electronically Signed by Kathlen Mody MD on 01/24/2011 02:26:56 AM

## 2011-02-03 ENCOUNTER — Ambulatory Visit
Admission: RE | Admit: 2011-02-03 | Discharge: 2011-02-03 | Disposition: A | Discharge status: Home / Self Care | Payer: Medicare Other | Source: Ambulatory Visit | Attending: Internal Medicine | Admitting: Internal Medicine

## 2011-02-03 DIAGNOSIS — Z1231 Encounter for screening mammogram for malignant neoplasm of breast: Secondary | ICD-10-CM

## 2011-03-17 ENCOUNTER — Emergency Department (HOSPITAL_COMMUNITY)
Admission: EM | Admit: 2011-03-17 | Discharge: 2011-03-17 | Disposition: A | Discharge status: Home / Self Care | Payer: Medicare Other | Attending: Emergency Medicine | Admitting: Emergency Medicine

## 2011-03-17 DIAGNOSIS — F3289 Other specified depressive episodes: Secondary | ICD-10-CM | POA: Insufficient documentation

## 2011-03-17 DIAGNOSIS — F1010 Alcohol abuse, uncomplicated: Secondary | ICD-10-CM | POA: Insufficient documentation

## 2011-03-17 DIAGNOSIS — F329 Major depressive disorder, single episode, unspecified: Secondary | ICD-10-CM | POA: Insufficient documentation

## 2011-03-17 DIAGNOSIS — E039 Hypothyroidism, unspecified: Secondary | ICD-10-CM | POA: Insufficient documentation

## 2011-03-17 DIAGNOSIS — Z79899 Other long term (current) drug therapy: Secondary | ICD-10-CM | POA: Insufficient documentation

## 2011-03-17 LAB — DIFFERENTIAL
Basophils Relative: 1 % (ref 0–1)
Eosinophils Absolute: 0.1 10*3/uL (ref 0.0–0.7)
Eosinophils Relative: 2 % (ref 0–5)
Lymphs Abs: 1.5 10*3/uL (ref 0.7–4.0)
Monocytes Absolute: 0.5 10*3/uL (ref 0.1–1.0)
Monocytes Relative: 9 % (ref 3–12)
Neutrophils Relative %: 63 % (ref 43–77)

## 2011-03-17 LAB — COMPREHENSIVE METABOLIC PANEL
AST: 25 U/L (ref 0–37)
CO2: 26 mEq/L (ref 19–32)
Calcium: 10 mg/dL (ref 8.4–10.5)
Creatinine, Ser: 1.23 mg/dL — ABNORMAL HIGH (ref 0.50–1.10)
GFR calc Af Amer: 53 mL/min — ABNORMAL LOW (ref >60)
GFR calc non Af Amer: 44 mL/min — ABNORMAL LOW (ref >60)
Sodium: 138 mEq/L (ref 135–145)
Total Protein: 6.3 g/dL (ref 6.0–8.3)

## 2011-03-17 LAB — CBC
MCH: 31.4 pg (ref 26.0–34.0)
MCHC: 34.7 g/dL (ref 30.0–36.0)
MCV: 90.4 fL (ref 78.0–100.0)
Platelets: 143 10*3/uL — ABNORMAL LOW (ref 150–400)
RBC: 3.22 MIL/uL — ABNORMAL LOW (ref 3.87–5.11)

## 2011-03-28 ENCOUNTER — Emergency Department (HOSPITAL_COMMUNITY): Payer: Medicare Other

## 2011-03-28 ENCOUNTER — Emergency Department (HOSPITAL_COMMUNITY)
Admission: EM | Admit: 2011-03-28 | Discharge: 2011-03-28 | Disposition: A | Discharge status: Home / Self Care | Payer: Medicare Other | Attending: Emergency Medicine | Admitting: Emergency Medicine

## 2011-03-28 DIAGNOSIS — W1809XA Striking against other object with subsequent fall, initial encounter: Secondary | ICD-10-CM | POA: Insufficient documentation

## 2011-03-28 DIAGNOSIS — E0500 Thyrotoxicosis with diffuse goiter without thyrotoxic crisis or storm: Secondary | ICD-10-CM | POA: Insufficient documentation

## 2011-03-28 DIAGNOSIS — F1020 Alcohol dependence, uncomplicated: Secondary | ICD-10-CM | POA: Insufficient documentation

## 2011-03-28 DIAGNOSIS — F1010 Alcohol abuse, uncomplicated: Secondary | ICD-10-CM | POA: Insufficient documentation

## 2011-03-28 DIAGNOSIS — R110 Nausea: Secondary | ICD-10-CM | POA: Insufficient documentation

## 2011-03-28 DIAGNOSIS — Y92009 Unspecified place in unspecified non-institutional (private) residence as the place of occurrence of the external cause: Secondary | ICD-10-CM | POA: Insufficient documentation

## 2011-03-28 DIAGNOSIS — R51 Headache: Secondary | ICD-10-CM | POA: Insufficient documentation

## 2011-03-28 DIAGNOSIS — E039 Hypothyroidism, unspecified: Secondary | ICD-10-CM | POA: Insufficient documentation

## 2011-03-28 DIAGNOSIS — R079 Chest pain, unspecified: Secondary | ICD-10-CM | POA: Insufficient documentation

## 2011-03-28 LAB — DIFFERENTIAL
Basophils Relative: 0 % (ref 0–1)
Eosinophils Relative: 0 % (ref 0–5)
Lymphocytes Relative: 26 % (ref 12–46)
Monocytes Absolute: 0.3 10*3/uL (ref 0.1–1.0)
Monocytes Relative: 6 % (ref 3–12)
Neutro Abs: 3.6 10*3/uL (ref 1.7–7.7)

## 2011-03-28 LAB — LIPASE, BLOOD: Lipase: 52 U/L (ref 11–59)

## 2011-03-28 LAB — ETHANOL: Alcohol, Ethyl (B): 200 mg/dL — ABNORMAL HIGH (ref 0–11)

## 2011-03-28 LAB — URINALYSIS, ROUTINE W REFLEX MICROSCOPIC
Glucose, UA: NEGATIVE mg/dL
Hgb urine dipstick: NEGATIVE
Leukocytes, UA: NEGATIVE
Protein, ur: NEGATIVE mg/dL
Specific Gravity, Urine: 1.02 (ref 1.005–1.030)

## 2011-03-28 LAB — RAPID URINE DRUG SCREEN, HOSP PERFORMED
Amphetamines: NOT DETECTED
Cocaine: NOT DETECTED
Opiates: NOT DETECTED
Tetrahydrocannabinol: NOT DETECTED

## 2011-03-28 LAB — COMPREHENSIVE METABOLIC PANEL
Albumin: 3.8 g/dL (ref 3.5–5.2)
BUN: 20 mg/dL (ref 6–23)
Calcium: 9.5 mg/dL (ref 8.4–10.5)
GFR calc Af Amer: >60 mL/min (ref >60)
Glucose, Bld: 103 mg/dL — ABNORMAL HIGH (ref 70–99)
Total Protein: 7.2 g/dL (ref 6.0–8.3)

## 2011-03-28 LAB — CBC
HCT: 37.6 % (ref 36.0–46.0)
Hemoglobin: 13.4 g/dL (ref 12.0–15.0)
MCH: 31.1 pg (ref 26.0–34.0)
MCHC: 35.6 g/dL (ref 30.0–36.0)
RDW: 12.6 % (ref 11.5–15.5)

## 2011-03-28 LAB — TSH: TSH: 6.959 u[IU]/mL — ABNORMAL HIGH (ref 0.350–4.500)

## 2011-05-11 ENCOUNTER — Encounter: Payer: Self-pay | Admitting: Internal Medicine

## 2011-05-11 ENCOUNTER — Ambulatory Visit (INDEPENDENT_AMBULATORY_CARE_PROVIDER_SITE_OTHER): Payer: Medicare Other | Admitting: Internal Medicine

## 2011-05-11 VITALS — BP 136/60 | HR 80 | Temp 99.4°F | Resp 16 | Wt 131.0 lb

## 2011-05-11 DIAGNOSIS — J039 Acute tonsillitis, unspecified: Secondary | ICD-10-CM

## 2011-05-11 MED ORDER — CEFUROXIME AXETIL 500 MG PO TABS: 500.0000 mg | ORAL_TABLET | Freq: Two times a day (BID) | ORAL | Status: AC

## 2011-05-11 NOTE — Progress Notes (Signed)
Subjective:    Patient ID: Mary Graves, female    DOB: 31-Mar-1946, 65 y.o.   MRN: [id number]  Sore Throat  This is a new problem. Episode onset: 2 weeks. The problem has been unchanged. Neither side of throat is experiencing more pain than the other. The maximum temperature recorded prior to her arrival was 100 - 100.9 F. The pain is at a severity of 2/10. The pain is mild. Pertinent negatives include no abdominal pain, congestion, coughing, diarrhea, drooling, ear discharge, ear pain, headaches, hoarse voice, plugged ear sensation, neck pain, shortness of breath, stridor, swollen glands, trouble swallowing or vomiting. She has had exposure to strep. She has had no exposure to mono. She has tried nothing for the symptoms.      Review of Systems  Constitutional: Positive for chills. Negative for fever, diaphoresis, activity change, appetite change, fatigue and unexpected weight change.  HENT: Positive for sore throat. Negative for ear pain, nosebleeds, congestion, hoarse voice, facial swelling, rhinorrhea, sneezing, drooling, mouth sores, trouble swallowing, neck pain, neck stiffness, dental problem, voice change, postnasal drip, sinus pressure, tinnitus and ear discharge.   Eyes: Negative.   Respiratory: Negative for cough, shortness of breath and stridor.   Cardiovascular: Negative for chest pain, palpitations and leg swelling.  Gastrointestinal: Negative for vomiting, abdominal pain and diarrhea.  Genitourinary: Negative for dysuria, urgency, frequency, hematuria, decreased urine volume, enuresis, difficulty urinating and dyspareunia.  Musculoskeletal: Negative for myalgias, back pain, joint swelling, arthralgias and gait problem.  Skin: Negative for color change, pallor, rash and wound.  Neurological: Negative for dizziness, tremors, seizures, syncope, facial asymmetry, speech difficulty, weakness, light-headedness, numbness and headaches.  Hematological: Negative for adenopathy. Does not  bruise/bleed easily.  Psychiatric/Behavioral: Negative.        Objective:   Physical Exam  Vitals reviewed. Constitutional: She is oriented to person, place, and time.  HENT:  Head: No trismus in the jaw.  Right Ear: Hearing, tympanic membrane, external ear and ear canal normal.  Left Ear: Hearing, tympanic membrane, external ear and ear canal normal.  Nose: Mucosal edema and rhinorrhea present. No nose lacerations, sinus tenderness, nasal deformity, septal deviation or nasal septal hematoma. No epistaxis.  No foreign bodies. Right sinus exhibits no maxillary sinus tenderness and no frontal sinus tenderness. Left sinus exhibits no maxillary sinus tenderness and no frontal sinus tenderness.  Mouth/Throat: Uvula is midline. Mucous membranes are not pale, not dry and not cyanotic. No uvula swelling. Posterior oropharyngeal erythema present. No oropharyngeal exudate, posterior oropharyngeal edema or tonsillar abscesses.  Eyes: Conjunctivae are normal. Right eye exhibits no discharge. Left eye exhibits no discharge. No scleral icterus.  Neck: Normal range of motion. Neck supple. No JVD present. No tracheal deviation present. No thyromegaly present.  Cardiovascular: Normal rate, regular rhythm, normal heart sounds and intact distal pulses.  Exam reveals no gallop and no friction rub.   No murmur heard. Pulmonary/Chest: Effort normal and breath sounds normal. No stridor. No respiratory distress. She has no wheezes. She has no rales. She exhibits no tenderness.  Abdominal: Soft. Bowel sounds are normal. She exhibits no distension and no mass. There is no tenderness. There is no rebound and no guarding.  Musculoskeletal: Normal range of motion. She exhibits no edema and no tenderness.  Lymphadenopathy:    She has no cervical adenopathy.  Neurological: She is oriented to person, place, and time.  Skin: Skin is warm and dry. No rash noted. No erythema. No pallor.  Psychiatric: She has a normal mood  and affect. Her behavior is normal. Judgment and thought content normal.          Assessment & Plan:

## 2011-05-11 NOTE — Assessment & Plan Note (Signed)
Start ceftin 

## 2011-05-11 NOTE — Patient Instructions (Signed)
Tonsillitis Tonsils are lumps of lymphoid tissues at the back of the throat. They help fight nose and throat infections and keep infection from spreading to other parts of the body. Tonsillitis is an infection of the throat that causes the tonsils to become red, tender, and swollen. If attacks of tonsillitis are severe and frequent, your caregiver may recommend surgery to remove the tonsils (tonsillectomy). Many sore throats are viral. Cultures are often taken to determine the need for medications that fight infections. HOME CARE INSTRUCTIONS  You or your child should rest as much as possible and get plenty of sleep.   Encourage drinking plenty of fluids. While the throat is very sore, eat or feed your child soft foods or liquids, such as milk, shakes, ice cream, soups, or instant breakfast drinks.   Children may suck on frozen ice bars.   Older children and adults may gargle with a warm or cold liquid to help soothe the throat. For gargling, use 1 teaspoon of salt mixed in 1 cup of water.   Family members who develop a sore throat or fever should have a medical exam or throat culture.   Only take over-the-counter or prescription medicines for pain, discomfort, or fever as directed by your caregiver.  SEEK MEDICAL CARE IF:  You or your child has an oral temperature above 102 F (38.9 C).   Your baby is older than 3 months with a rectal temperature of 100.5 F (38.1 C) or higher for more than 1 day.   Large, tender lumps in the neck develop.   A rash develops.   Green, yellow-brown, or bloody sputum is coughed up.   You were instructed to call for culture results.  SEEK IMMEDIATE MEDICAL CARE IF:  You or your child develops any new symptoms such as vomiting, severe headache, stiff neck, chest pain, or trouble breathing or swallowing.   You or your child has severe throat pain along with drooling or voice changes.   You or your child has severe pain, unrelieved with recommended  medications.   You or your child is unable to fully open the mouth.   You or your child develops redness, swelling, or severe pain anywhere on the neck.   You or your child has an oral temperature above 102 F (38.9 C), not controlled by medicine.   Your baby is older than 3 months with a rectal temperature of 102 F (38.9 C) or higher.   Your baby is 87 months old or younger with a rectal temperature of 100.4 F (38 C) or higher.  MAKE SURE YOU:  Understand these instructions.   Will watch your condition.   Will get help right away if you are not doing well or get worse.  Document Released: 04/27/2005 Document Re-Released: 01/05/2010 [facility] Patient Information 2011 [place], [place].

## 2011-08-22 DIAGNOSIS — F331 Major depressive disorder, recurrent, moderate: Secondary | ICD-10-CM | POA: Diagnosis not present

## 2011-09-19 DIAGNOSIS — F331 Major depressive disorder, recurrent, moderate: Secondary | ICD-10-CM | POA: Diagnosis not present

## 2011-10-15 ENCOUNTER — Other Ambulatory Visit: Payer: Self-pay | Admitting: Internal Medicine

## 2011-10-17 ENCOUNTER — Encounter: Payer: Self-pay | Admitting: Internal Medicine

## 2011-10-17 ENCOUNTER — Ambulatory Visit (INDEPENDENT_AMBULATORY_CARE_PROVIDER_SITE_OTHER): Payer: Medicare Other | Admitting: Internal Medicine

## 2011-10-17 ENCOUNTER — Other Ambulatory Visit (INDEPENDENT_AMBULATORY_CARE_PROVIDER_SITE_OTHER): Payer: Medicare Other

## 2011-10-17 VITALS — BP 118/62 | HR 66 | Temp 97.2°F | Resp 16 | Wt 134.0 lb

## 2011-10-17 DIAGNOSIS — D6489 Other specified anemias: Secondary | ICD-10-CM

## 2011-10-17 DIAGNOSIS — N181 Chronic kidney disease, stage 1: Secondary | ICD-10-CM

## 2011-10-17 DIAGNOSIS — R7309 Other abnormal glucose: Secondary | ICD-10-CM | POA: Diagnosis not present

## 2011-10-17 DIAGNOSIS — I10 Essential (primary) hypertension: Secondary | ICD-10-CM

## 2011-10-17 DIAGNOSIS — R7401 Elevation of levels of liver transaminase levels: Secondary | ICD-10-CM

## 2011-10-17 DIAGNOSIS — E039 Hypothyroidism, unspecified: Secondary | ICD-10-CM | POA: Diagnosis not present

## 2011-10-17 LAB — LIPID PANEL
LDL Cholesterol: 94 mg/dL (ref 0–99)
Total CHOL/HDL Ratio: 3
VLDL: 19 mg/dL (ref 0.0–40.0)

## 2011-10-17 LAB — CBC WITH DIFFERENTIAL/PLATELET
Basophils Relative: 0.5 % (ref 0.0–3.0)
Eosinophils Absolute: 0.1 10*3/uL (ref 0.0–0.7)
Lymphocytes Relative: 29.5 % (ref 12.0–46.0)
MCHC: 33.2 g/dL (ref 30.0–36.0)
Monocytes Relative: 6.3 % (ref 3.0–12.0)
Neutrophils Relative %: 61.9 % (ref 43.0–77.0)
RBC: 4.12 Mil/uL (ref 3.87–5.11)
WBC: 6.7 10*3/uL (ref 4.5–10.5)

## 2011-10-17 NOTE — Assessment & Plan Note (Signed)
She has no s/s of blood loss, I will recheck her CBC today 

## 2011-10-17 NOTE — Assessment & Plan Note (Signed)
Her BP is well controlled, I will check her lytes and renal function 

## 2011-10-17 NOTE — Assessment & Plan Note (Signed)
I will recheck her a1c today 

## 2011-10-17 NOTE — Patient Instructions (Signed)

## 2011-10-17 NOTE — Assessment & Plan Note (Signed)
I will check her BMP today 

## 2011-10-17 NOTE — Progress Notes (Signed)
  Subjective:    Patient ID: Mary Graves, female    DOB: April 16, 1946, 66 y.o.   MRN: [id number]  Thyroid Problem Presents for follow-up visit. Symptoms include cold intolerance and fatigue. Patient reports no anxiety, constipation, depressed mood, diaphoresis, diarrhea, dry skin, hair loss, heat intolerance, hoarse voice, leg swelling, menstrual problem, nail problem, palpitations, tremors, visual change, weight gain or weight loss. The symptoms have been resolved.      Review of Systems  Constitutional: Positive for fatigue. Negative for fever, chills, weight loss, weight gain, diaphoresis, activity change, appetite change and unexpected weight change.  HENT: Negative.  Negative for hoarse voice.   Eyes: Negative.   Respiratory: Negative for apnea, cough, choking, chest tightness, shortness of breath, wheezing and stridor.   Cardiovascular: Negative for chest pain, palpitations and leg swelling.  Gastrointestinal: Negative for nausea, vomiting, abdominal pain, diarrhea, constipation and anal bleeding.  Genitourinary: Negative for dysuria, urgency, frequency, hematuria, flank pain, enuresis, difficulty urinating, menstrual problem and dyspareunia.  Musculoskeletal: Negative for myalgias, back pain, joint swelling, arthralgias and gait problem.  Skin: Negative for color change, pallor, rash and wound.  Neurological: Negative for dizziness, tremors, seizures, syncope, facial asymmetry, speech difficulty, weakness, light-headedness, numbness and headaches.  Hematological: Positive for cold intolerance. Negative for heat intolerance and adenopathy. Does not bruise/bleed easily.  Psychiatric/Behavioral: Negative.        Objective:   Physical Exam  Vitals reviewed. Constitutional: She is oriented to person, place, and time. She appears well-developed and well-nourished. No distress.  HENT:  Head: Normocephalic and atraumatic.  Mouth/Throat: Oropharynx is clear and moist. No oropharyngeal  exudate.  Eyes: Conjunctivae are normal. Right eye exhibits no discharge. Left eye exhibits no discharge. No scleral icterus.  Neck: Normal range of motion. Neck supple. No JVD present. No tracheal deviation present. No thyromegaly present.  Cardiovascular: Normal rate, regular rhythm, normal heart sounds and intact distal pulses.  Exam reveals no gallop and no friction rub.   No murmur heard. Pulmonary/Chest: Effort normal and breath sounds normal. No stridor. No respiratory distress. She has no wheezes. She has no rales. She exhibits no tenderness.  Abdominal: Soft. Bowel sounds are normal. She exhibits no distension and no mass. There is no tenderness. There is no rebound and no guarding.  Musculoskeletal: Normal range of motion. She exhibits no edema and no tenderness.  Lymphadenopathy:    She has no cervical adenopathy.  Neurological: She is oriented to person, place, and time.  Skin: Skin is warm and dry. No rash noted. She is not diaphoretic. No erythema. No pallor.  Psychiatric: She has a normal mood and affect. Her behavior is normal. Judgment and thought content normal.      Lab Results  Component Value Date   WBC 5.3 03/28/2011   HGB 13.4 03/28/2011   HCT 37.6 03/28/2011   PLT 225 03/28/2011   GLUCOSE 103* 03/28/2011   CHOL 241* 10/19/2009   TRIG 40.0 10/19/2009   HDL 87.20 10/19/2009   LDLDIRECT 142.0 10/19/2009   ALT 31 03/28/2011   AST 59* 03/28/2011   NA 130* 03/28/2011   K 3.0* 03/28/2011   CL 87* 03/28/2011   CREATININE 1.02 03/28/2011   BUN 20 03/28/2011   CO2 28 03/28/2011   TSH 6.959* 03/28/2011   HGBA1C 5.7 10/19/2009      Assessment & Plan:

## 2011-10-17 NOTE — Assessment & Plan Note (Signed)
Check her TSH today and change the dose if needed

## 2011-10-18 ENCOUNTER — Other Ambulatory Visit: Payer: Self-pay | Admitting: Internal Medicine

## 2011-10-18 ENCOUNTER — Encounter: Payer: Self-pay | Admitting: Internal Medicine

## 2011-10-18 ENCOUNTER — Other Ambulatory Visit: Payer: Self-pay

## 2011-10-18 LAB — COMPREHENSIVE METABOLIC PANEL
ALT: 50 U/L — ABNORMAL HIGH (ref 0–35)
AST: 34 U/L (ref 0–37)
Albumin: 4.1 g/dL (ref 3.5–5.2)
BUN: 34 mg/dL — ABNORMAL HIGH (ref 6–23)
Calcium: 9.6 mg/dL (ref 8.4–10.5)
Chloride: 103 mEq/L (ref 96–112)
Potassium: 4.4 mEq/L (ref 3.5–5.1)

## 2011-10-18 MED ORDER — LEVOTHYROXINE SODIUM 125 MCG PO TABS: 125.0000 ug | ORAL_TABLET | Freq: Every day | ORAL | Status: DC

## 2012-01-10 DIAGNOSIS — F331 Major depressive disorder, recurrent, moderate: Secondary | ICD-10-CM | POA: Diagnosis not present

## 2012-01-30 ENCOUNTER — Other Ambulatory Visit: Payer: Self-pay | Admitting: Internal Medicine

## 2012-01-30 DIAGNOSIS — Z1231 Encounter for screening mammogram for malignant neoplasm of breast: Secondary | ICD-10-CM

## 2012-02-13 ENCOUNTER — Ambulatory Visit
Admission: RE | Admit: 2012-02-13 | Discharge: 2012-02-13 | Disposition: A | Discharge status: Home / Self Care | Payer: Medicare Other | Source: Ambulatory Visit | Attending: Internal Medicine | Admitting: Internal Medicine

## 2012-02-13 DIAGNOSIS — Z1231 Encounter for screening mammogram for malignant neoplasm of breast: Secondary | ICD-10-CM

## 2012-02-14 LAB — HM MAMMOGRAPHY: HM Mammogram: NORMAL

## 2012-04-03 DIAGNOSIS — F331 Major depressive disorder, recurrent, moderate: Secondary | ICD-10-CM | POA: Diagnosis not present

## 2012-04-04 DIAGNOSIS — F331 Major depressive disorder, recurrent, moderate: Secondary | ICD-10-CM | POA: Diagnosis not present

## 2012-04-06 DIAGNOSIS — F331 Major depressive disorder, recurrent, moderate: Secondary | ICD-10-CM | POA: Diagnosis not present

## 2012-04-10 DIAGNOSIS — F331 Major depressive disorder, recurrent, moderate: Secondary | ICD-10-CM | POA: Diagnosis not present

## 2012-04-16 DIAGNOSIS — F331 Major depressive disorder, recurrent, moderate: Secondary | ICD-10-CM | POA: Diagnosis not present

## 2012-05-21 DIAGNOSIS — Z23 Encounter for immunization: Secondary | ICD-10-CM | POA: Diagnosis not present

## 2012-05-31 ENCOUNTER — Encounter (HOSPITAL_COMMUNITY): Payer: Self-pay | Admitting: *Deleted

## 2012-05-31 ENCOUNTER — Emergency Department (HOSPITAL_COMMUNITY)
Admission: EM | Admit: 2012-05-31 | Discharge: 2012-06-01 | Disposition: A | Discharge status: Rehabilitation Facility | Payer: Medicare Other | Source: Home / Self Care

## 2012-05-31 DIAGNOSIS — F10229 Alcohol dependence with intoxication, unspecified: Secondary | ICD-10-CM | POA: Insufficient documentation

## 2012-05-31 DIAGNOSIS — E039 Hypothyroidism, unspecified: Secondary | ICD-10-CM | POA: Insufficient documentation

## 2012-05-31 DIAGNOSIS — I10 Essential (primary) hypertension: Secondary | ICD-10-CM | POA: Insufficient documentation

## 2012-05-31 DIAGNOSIS — F1010 Alcohol abuse, uncomplicated: Secondary | ICD-10-CM

## 2012-05-31 LAB — RAPID URINE DRUG SCREEN, HOSP PERFORMED
Amphetamines: NOT DETECTED
Benzodiazepines: POSITIVE — AB
Cocaine: NOT DETECTED
Opiates: NOT DETECTED
Tetrahydrocannabinol: NOT DETECTED

## 2012-05-31 LAB — ETHANOL: Alcohol, Ethyl (B): 294 mg/dL — ABNORMAL HIGH (ref 0–11)

## 2012-05-31 LAB — COMPREHENSIVE METABOLIC PANEL
AST: 32 U/L (ref 0–37)
Albumin: 3.4 g/dL — ABNORMAL LOW (ref 3.5–5.2)
BUN: 13 mg/dL (ref 6–23)
Calcium: 8.5 mg/dL (ref 8.4–10.5)
Chloride: 91 mEq/L — ABNORMAL LOW (ref 96–112)
Creatinine, Ser: 0.86 mg/dL (ref 0.50–1.10)
Total Protein: 6.4 g/dL (ref 6.0–8.3)

## 2012-05-31 LAB — CBC
HCT: 34.9 % — ABNORMAL LOW (ref 36.0–46.0)
MCH: 31.5 pg (ref 26.0–34.0)
MCHC: 35.5 g/dL (ref 30.0–36.0)
MCV: 88.6 fL (ref 78.0–100.0)
Platelets: 105 10*3/uL — ABNORMAL LOW (ref 150–400)
RDW: 14.4 % (ref 11.5–15.5)
WBC: 5.5 10*3/uL (ref 4.0–10.5)

## 2012-05-31 MED ORDER — VITAMIN B-1 100 MG PO TABS
100.0000 mg | ORAL_TABLET | Freq: Every day | ORAL | Status: DC
  Administered 2012-05-31: 100 mg via ORAL
  Filled 2012-05-31: qty 1

## 2012-05-31 MED ORDER — LORAZEPAM 1 MG PO TABS
0.0000 mg | ORAL_TABLET | Freq: Four times a day (QID) | ORAL | Status: DC
  Administered 2012-05-31: 1 mg via ORAL
  Administered 2012-06-01: 2 mg via ORAL
  Filled 2012-05-31: qty 2
  Filled 2012-05-31: qty 1

## 2012-05-31 MED ORDER — LORAZEPAM 2 MG/ML IJ SOLN: 1.0000 mg | Freq: Four times a day (QID) | INTRAMUSCULAR | Status: DC | PRN

## 2012-05-31 MED ORDER — POTASSIUM CHLORIDE CRYS ER 20 MEQ PO TBCR
40.0000 meq | EXTENDED_RELEASE_TABLET | Freq: Two times a day (BID) | ORAL | Status: DC
  Administered 2012-06-01: 40 meq via ORAL
  Filled 2012-05-31: qty 2

## 2012-05-31 MED ORDER — ADULT MULTIVITAMIN W/MINERALS CH
1.0000 | ORAL_TABLET | Freq: Every day | ORAL | Status: DC
  Administered 2012-05-31: 1 via ORAL
  Filled 2012-05-31: qty 1

## 2012-05-31 MED ORDER — ONDANSETRON HCL 4 MG/2ML IJ SOLN
4.0000 mg | Freq: Once | INTRAMUSCULAR | Status: AC
  Administered 2012-05-31: 4 mg via INTRAVENOUS
  Filled 2012-05-31: qty 2

## 2012-05-31 MED ORDER — SODIUM CHLORIDE 0.9 % IV SOLN
1000.0000 mL | Freq: Once | INTRAVENOUS | Status: AC
  Administered 2012-05-31: 1000 mL via INTRAVENOUS

## 2012-05-31 MED ORDER — SODIUM CHLORIDE 0.9 % IV SOLN
1000.0000 mL | INTRAVENOUS | Status: DC
  Administered 2012-05-31 – 2012-06-01 (×2): 1000 mL via INTRAVENOUS

## 2012-05-31 MED ORDER — LORAZEPAM 1 MG PO TABS: 0.0000 mg | ORAL_TABLET | Freq: Two times a day (BID) | ORAL | Status: DC

## 2012-05-31 MED ORDER — LORAZEPAM 1 MG PO TABS: 1.0000 mg | ORAL_TABLET | Freq: Four times a day (QID) | ORAL | Status: DC | PRN

## 2012-05-31 MED ORDER — THIAMINE HCL 100 MG/ML IJ SOLN: 100.0000 mg | Freq: Every day | INTRAMUSCULAR | Status: DC

## 2012-05-31 MED ORDER — FOLIC ACID 1 MG PO TABS
1.0000 mg | ORAL_TABLET | Freq: Every day | ORAL | Status: DC
  Administered 2012-05-31: 1 mg via ORAL
  Filled 2012-05-31: qty 1

## 2012-05-31 MED ORDER — IBUPROFEN 200 MG PO TABS: 600.0000 mg | ORAL_TABLET | Freq: Three times a day (TID) | ORAL | Status: DC | PRN

## 2012-05-31 MED ORDER — ONDANSETRON HCL 4 MG PO TABS
4.0000 mg | ORAL_TABLET | Freq: Three times a day (TID) | ORAL | Status: DC | PRN
  Administered 2012-06-01: 4 mg via ORAL
  Filled 2012-05-31: qty 1

## 2012-05-31 MED ORDER — ZOLPIDEM TARTRATE 5 MG PO TABS: 5.0000 mg | ORAL_TABLET | Freq: Every evening | ORAL | Status: DC | PRN
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
## 2012-06-01 ENCOUNTER — Encounter (HOSPITAL_COMMUNITY): Payer: Self-pay | Admitting: *Deleted

## 2012-06-01 ENCOUNTER — Inpatient Hospital Stay (HOSPITAL_COMMUNITY)
Admission: AD | Admit: 2012-06-01 | Discharge: 2012-06-05 | DRG: 885 | Disposition: A | Discharge status: Home / Self Care | Payer: Medicare Other | Source: Ambulatory Visit | Attending: Psychiatry | Admitting: Psychiatry

## 2012-06-01 DIAGNOSIS — F329 Major depressive disorder, single episode, unspecified: Secondary | ICD-10-CM

## 2012-06-01 DIAGNOSIS — N181 Chronic kidney disease, stage 1: Secondary | ICD-10-CM

## 2012-06-01 DIAGNOSIS — F1020 Alcohol dependence, uncomplicated: Secondary | ICD-10-CM | POA: Diagnosis present

## 2012-06-01 DIAGNOSIS — E039 Hypothyroidism, unspecified: Secondary | ICD-10-CM | POA: Diagnosis present

## 2012-06-01 DIAGNOSIS — F10229 Alcohol dependence with intoxication, unspecified: Secondary | ICD-10-CM | POA: Diagnosis present

## 2012-06-01 DIAGNOSIS — F1010 Alcohol abuse, uncomplicated: Secondary | ICD-10-CM

## 2012-06-01 DIAGNOSIS — F332 Major depressive disorder, recurrent severe without psychotic features: Principal | ICD-10-CM

## 2012-06-01 DIAGNOSIS — H40009 Preglaucoma, unspecified, unspecified eye: Secondary | ICD-10-CM

## 2012-06-01 DIAGNOSIS — F1994 Other psychoactive substance use, unspecified with psychoactive substance-induced mood disorder: Secondary | ICD-10-CM | POA: Diagnosis present

## 2012-06-01 DIAGNOSIS — I10 Essential (primary) hypertension: Secondary | ICD-10-CM | POA: Diagnosis present

## 2012-06-01 DIAGNOSIS — D6489 Other specified anemias: Secondary | ICD-10-CM

## 2012-06-01 DIAGNOSIS — D485 Neoplasm of uncertain behavior of skin: Secondary | ICD-10-CM

## 2012-06-01 DIAGNOSIS — Z79899 Other long term (current) drug therapy: Secondary | ICD-10-CM

## 2012-06-01 DIAGNOSIS — R7309 Other abnormal glucose: Secondary | ICD-10-CM

## 2012-06-01 DIAGNOSIS — R7401 Elevation of levels of liver transaminase levels: Secondary | ICD-10-CM

## 2012-06-01 LAB — ETHANOL: Alcohol, Ethyl (B): 172 mg/dL — ABNORMAL HIGH (ref 0–11)

## 2012-06-01 MED ORDER — THIAMINE HCL 100 MG/ML IJ SOLN
100.0000 mg | Freq: Once | INTRAMUSCULAR | Status: AC
  Administered 2012-06-01: 100 mg via INTRAMUSCULAR

## 2012-06-01 MED ORDER — ADULT MULTIVITAMIN W/MINERALS CH
1.0000 | ORAL_TABLET | Freq: Every day | ORAL | Status: DC
  Administered 2012-06-01 – 2012-06-05 (×5): 1 via ORAL
  Filled 2012-06-01 (×6): qty 1

## 2012-06-01 MED ORDER — VITAMIN B-1 100 MG PO TABS
100.0000 mg | ORAL_TABLET | Freq: Every day | ORAL | Status: DC
  Administered 2012-06-02 – 2012-06-05 (×4): 100 mg via ORAL
  Filled 2012-06-01 (×5): qty 1

## 2012-06-01 MED ORDER — CHLORDIAZEPOXIDE HCL 25 MG PO CAPS
25.0000 mg | ORAL_CAPSULE | Freq: Four times a day (QID) | ORAL | Status: AC
  Administered 2012-06-01 (×4): 25 mg via ORAL
  Filled 2012-06-01 (×2): qty 1

## 2012-06-01 MED ORDER — HYDROMORPHONE HCL PF 1 MG/ML IJ SOLN
1.0000 mg | Freq: Once | INTRAMUSCULAR | Status: AC
  Administered 2012-06-01: 1 mg via INTRAVENOUS

## 2012-06-01 MED ORDER — CHLORDIAZEPOXIDE HCL 25 MG PO CAPS
ORAL_CAPSULE | ORAL | Status: AC
  Administered 2012-06-01: 25 mg via ORAL
  Filled 2012-06-01: qty 1

## 2012-06-01 MED ORDER — ACETAMINOPHEN 325 MG PO TABS
650.0000 mg | ORAL_TABLET | Freq: Four times a day (QID) | ORAL | Status: DC | PRN
  Administered 2012-06-04: 650 mg via ORAL

## 2012-06-01 MED ORDER — HYDROMORPHONE HCL PF 1 MG/ML IJ SOLN
1.0000 mg | Freq: Once | INTRAMUSCULAR | Status: DC
  Filled 2012-06-01: qty 1

## 2012-06-01 MED ORDER — PROMETHAZINE HCL 25 MG/ML IJ SOLN
25.0000 mg | Freq: Once | INTRAMUSCULAR | Status: AC
  Administered 2012-06-01: 25 mg via INTRAVENOUS
  Filled 2012-06-01: qty 1

## 2012-06-01 MED ORDER — CHLORDIAZEPOXIDE HCL 25 MG PO CAPS
25.0000 mg | ORAL_CAPSULE | Freq: Three times a day (TID) | ORAL | Status: AC
  Administered 2012-06-02 (×3): 25 mg via ORAL
  Filled 2012-06-01 (×4): qty 1

## 2012-06-01 MED ORDER — SERTRALINE HCL 100 MG PO TABS
100.0000 mg | ORAL_TABLET | Freq: Every day | ORAL | Status: DC
  Administered 2012-06-01 – 2012-06-05 (×5): 100 mg via ORAL
  Filled 2012-06-01 (×7): qty 1

## 2012-06-01 MED ORDER — SERTRALINE HCL 100 MG PO TABS: 100.0000 mg | ORAL_TABLET | Freq: Every day | ORAL | Status: DC

## 2012-06-01 MED ORDER — CLONIDINE HCL 0.1 MG PO TABS
0.1000 mg | ORAL_TABLET | Freq: Two times a day (BID) | ORAL | Status: DC
  Administered 2012-06-01: 0.1 mg via ORAL
  Filled 2012-06-01: qty 1

## 2012-06-01 MED ORDER — ALUM & MAG HYDROXIDE-SIMETH 200-200-20 MG/5ML PO SUSP: 30.0000 mL | ORAL | Status: DC | PRN

## 2012-06-01 MED ORDER — LEVOTHYROXINE SODIUM 125 MCG PO TABS
125.0000 ug | ORAL_TABLET | Freq: Every day | ORAL | Status: DC
  Filled 2012-06-01: qty 1

## 2012-06-01 MED ORDER — ONDANSETRON HCL 4 MG/2ML IJ SOLN
4.0000 mg | Freq: Once | INTRAMUSCULAR | Status: AC
  Administered 2012-06-01: 4 mg via INTRAVENOUS

## 2012-06-01 MED ORDER — CHLORDIAZEPOXIDE HCL 25 MG PO CAPS
25.0000 mg | ORAL_CAPSULE | ORAL | Status: AC
  Administered 2012-06-03 (×2): 25 mg via ORAL
  Filled 2012-06-01: qty 1

## 2012-06-01 MED ORDER — ONDANSETRON 4 MG PO TBDP
ORAL_TABLET | ORAL | Status: AC
  Filled 2012-06-01: qty 1

## 2012-06-01 MED ORDER — ONDANSETRON 4 MG PO TBDP: 4.0000 mg | ORAL_TABLET | Freq: Four times a day (QID) | ORAL | Status: AC | PRN

## 2012-06-01 MED ORDER — HYDROXYZINE HCL 25 MG PO TABS
25.0000 mg | ORAL_TABLET | Freq: Four times a day (QID) | ORAL | Status: AC | PRN
  Administered 2012-06-01: 25 mg via ORAL

## 2012-06-01 MED ORDER — CHLORDIAZEPOXIDE HCL 25 MG PO CAPS
25.0000 mg | ORAL_CAPSULE | Freq: Every day | ORAL | Status: AC
  Administered 2012-06-04: 25 mg via ORAL
  Filled 2012-06-01: qty 1

## 2012-06-01 MED ORDER — LOPERAMIDE HCL 2 MG PO CAPS
2.0000 mg | ORAL_CAPSULE | ORAL | Status: AC | PRN
  Administered 2012-06-01: 4 mg via ORAL

## 2012-06-01 MED ORDER — LEVOTHYROXINE SODIUM 125 MCG PO TABS
125.0000 ug | ORAL_TABLET | Freq: Every day | ORAL | Status: DC
  Administered 2012-06-02 – 2012-06-05 (×4): 125 ug via ORAL
  Filled 2012-06-01 (×5): qty 1

## 2012-06-01 MED ORDER — ONDANSETRON HCL 4 MG/2ML IJ SOLN
INTRAMUSCULAR | Status: AC
  Filled 2012-06-01: qty 2

## 2012-06-01 MED ORDER — MAGNESIUM HYDROXIDE 400 MG/5ML PO SUSP: 30.0000 mL | Freq: Every day | ORAL | Status: DC | PRN

## 2012-06-01 MED ORDER — BOOST / RESOURCE BREEZE PO LIQD
1.0000 | Freq: Every day | ORAL | Status: DC
  Administered 2012-06-01 – 2012-06-02 (×2): 1 via ORAL
  Filled 2012-06-01 (×5): qty 1

## 2012-06-01 MED ORDER — CHLORDIAZEPOXIDE HCL 25 MG PO CAPS
25.0000 mg | ORAL_CAPSULE | Freq: Four times a day (QID) | ORAL | Status: AC | PRN
  Administered 2012-06-01 (×2): 25 mg via ORAL
  Filled 2012-06-01 (×2): qty 1

## 2012-06-01 NOTE — ED Notes (Signed)
Developed left upper quadrant abd pain with persisitent nausea and vomiting after drinking oral fluids and eating a small amt of sandwich.She relates history of pancreatitis. Noted lipase to be 124. Elevated BP, eyes bloodshot in appearance after wretching. DR. Fleet Contras, ED physician consulted and ED CN Vira Browns at bedside to evaluate. Orders initiated for pain and nausea unrelieved by Zofran given earlier.

## 2012-06-01 NOTE — [facility] Suicide Risk Assessment (Signed)
Suicide Risk Assessment  Admission Assessment     Nursing information obtained from:  Patient Demographic factors:  Divorced or widowed;Caucasian;Living alone;Unemployed Current Mental Status:  NA Loss Factors:  Decrease in vocational status;Decline in physical health;Financial problems / change in socioeconomic status Historical Factors:  Impulsivity Risk Reduction Factors:  Sense of responsibility to family;Positive therapeutic relationship  CLINICAL FACTORS:   Depression:   Comorbid alcohol abuse/dependence Alcohol/Substance Abuse/Dependencies  COGNITIVE FEATURES THAT CONTRIBUTE TO RISK:  None  SUICIDE RISK:   Moderate:  Frequent suicidal ideation with limited intensity, and duration, some specificity in terms of plans, no associated intent, good self-control, limited dysphoria/symptomatology, some risk factors present, and identifiable protective factors, including available and accessible social support.  PLAN OF CARE: Detox. Work on relapse prevention/coping skills                               Optimize treatment with antidepressants   Mary Graves A 06/01/2012, 6:10 PM

## 2012-06-01 NOTE — Progress Notes (Signed)
Brief Nutrition Note  Reason: MST score of 3  Patient reported she ate 33% at lunch today. She reported her appetite and intake have been very poor over the past several weeks. She reported little to no PO intake due to pancreatitis pain. She reported she has never received nutrition therapy information for pancreatitis.   Wt Readings from Last 10 Encounters:  06/01/12 118 lb (53.524 kg)  10/17/11 134 lb (60.782 kg)  05/11/11 131 lb (59.421 kg)  10/13/10 137 lb 8 oz (62.37 kg)  03/09/10 128 lb (58.06 kg)  03/05/10 127 lb (57.607 kg)  10/30/09 132 lb (59.875 kg)  10/19/09 133 lb 6.1 oz (60.501 kg)  02/23/09 136 lb (61.689 kg)  *Weight down 16 lb over 8 months.   Discussed and provided handout obtained from ADA nutrition care manual for pancreatitis nutrition therapy. I have encouraged the patient to increase PO intake. She was without any nutrition related questions and verbalized understanding of the nutrition information provided. She agreed to drink 1 [employer] nutrition supplement daily.   Will order patient RB once daily, provides 250 kcal and 9 grams of protein daily.   RD available for nutrition needs.   Iven Finn [facility] [phone]

## 2012-06-01 NOTE — Progress Notes (Signed)
D.  Pt pleasant on approach, new admission for ETOH detox.  Pt anxious and visibly tremulous.  Positive for evening AA group, interacting appropriately within milieu.  Denies SI/HI/hallucinations at this time.  A.  Support and encouragement offered, medication given as ordered for withdrawal symptoms.  R.  Will continue to monitor.  Safety maintained.

## 2012-06-01 NOTE — ED Notes (Signed)
Assisted up to Bathroom, unsteady gait. Voided twice in large amounts at close intervals.

## 2012-06-01 NOTE — ED Notes (Signed)
ED Dr. Fleet Contras updated regarding persistent hypertension, 200/96, in a restful state. No more N&V since 0430 and medicated.

## 2012-06-01 NOTE — Treatment Plan (Signed)
Pt is accepted to [facility], [place] to [place] pending an ETOH level under 200.

## 2012-06-01 NOTE — ED Notes (Addendum)
Received from the Psychiatric unit. Alert, however quickly becomes somulent. Elevated BP . Cooperative. Enforced calling for help before getting out of bed. Only reports mild nausea; CIWA 1. No IV in place upon arrival to TCU.

## 2012-06-01 NOTE — [facility] Counselor (Signed)
Adult Comprehensive Assessment  Patient ID: Mary Graves, female   DOB: October 09, 1945, 66 y.o.   MRN: [id number]  Information Source: Information source: Patient  Current Stressors:  Educational / Learning stressors: N/A Employment / Job issues: Recently fired due to smelling like alcohol Family Relationships: N/A Surveyor, quantity / Lack of resources (include bankruptcy): N/A Housing / Lack of housing: N/A Physical health (include injuries & life threatening diseases): In pain Social relationships: N/A Substance abuse: Alcohol Use Bereavement / Loss: N/A  Living/Environment/Situation:  Living Arrangements: Alone Living conditions (as described by patient or guardian): Pt lives in [place] alone and states that it is a good environment How long has patient lived in current situation?: 2 years What is atmosphere in current home: Comfortable  Family History:  Marital status: Divorced Divorced, when?: 1995 What types of issues is patient dealing with in the relationship?: Conflict in marriage Additional relationship information: N/A Does patient have children?: Yes How many children?: 1  How is patient's relationship with their children?: 1 daughter - good relationship with her  Childhood History:  By whom was/is the patient raised?: Both parents Additional childhood history information: Pt states father disappeared when pt was 40 years old Description of patient's relationship with caregiver when they were a child: Pt childhood was great until father left.  Pt states that she was miserable when father left because they were poor and pt had to work to help the family Patient's description of current relationship with people who raised him/her: Pt states that she has a good relationship with her mom Does patient have siblings?: Yes Number of Siblings: 2  Description of patient's current relationship with siblings: older sister and younger sister, states her older sister is her "BFF" Did  patient suffer any verbal/emotional/physical/sexual abuse as a child?: No Did patient suffer from severe childhood neglect?: No Has patient ever been sexually abused/assaulted/raped as an adolescent or adult?: No Was the patient ever a victim of a crime or a disaster?: No Witnessed domestic violence?: No Has patient been effected by domestic violence as an adult?: No  Education:  Highest grade of school patient has completed: Manufacturing engineer in IT consultant Currently a student?: No Learning disability?: No  Employment/Work Situation:   Employment situation: Unemployed (pt states that she was fired on Monday due to smell of alcoh) Patient's job has been impacted by current illness: No What is the longest time patient has a held a job?: 8 years 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, broadcasting/film/video for special education Has patient ever been in the [employer]?: No Has patient ever served in Buyer, retail?: No  Financial Resources:   Surveyor, quantity resources: [employer];Medicare (retirement check) Does patient have a representative payee or guardian?: No  Alcohol/Substance Abuse:   What has been your use of drugs/alcohol within the last 12 months?: Alcohol - 6 mike's hard cidar daily If attempted suicide, did drugs/alcohol play a role in this?: No Alcohol/Substance Abuse Treatment Hx: Past Tx, Inpatient;Past Tx, Outpatient If yes, describe treatment: Inpatient treatment in [place], CDIOP here 2 years ago Has alcohol/substance abuse ever caused legal problems?: Yes (DUI, open container )  Social Support System:   Patient's Community Support System: Fair Museum/gallery exhibitions officer System: pt states her daughter, sisters and brother in Social worker are supportive Type of faith/religion: [employer] - Christian How does patient's faith help to cope with current illness?: was going weekly but not since drinking  Leisure/Recreation:   Leisure and Hobbies: walking, hiking, read, watch  TV  Strengths/Needs:   What things does the patient do well?: Pt states that she is responsible, patient and tolerant In what areas does patient struggle / problems for patient: Alcohol Use  Discharge Plan:   Does patient have access to transportation?: Yes Will patient be returning to same living situation after discharge?: Yes Currently receiving community mental health services: Yes (From Whom) ([facility]) If no, would patient like referral for services when discharged?: Yes (What county?) [facility] [place]) Does patient have financial barriers related to discharge medications?: No  Summary/Recommendations:  Patient is a 66 year old Caucasian Female with a diagnosis of Alcohol Abuse.  Patient lives in [place] alone.  Patient will benefit from crisis stabilization, medication evaluation, group therapy and psycho education in addition to case management for discharge planning.      Horton, Salome Arnt. 06/01/2012

## 2012-06-01 NOTE — [facility] Counselor (Signed)
Informed by [facility] that pt's bed is available. Pt accepted to [facility] by Caroleen Hamman, PA to Dr. Dub Mikes (300-1).  Completed support documentation. Updated EDP. Pt is voluntary and to be transported via security.

## 2012-06-01 NOTE — Progress Notes (Signed)
Nursing Admit note This pt is a caucasian divorced female admitted voluntarily to [facility] for alcohol detox. 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 She is quite tremulous, has great difficulty answering assessment questions, frequently repeats herself , loses her train of thought and is struggling to maintain control. She is complaining of uncontrollable diarrhea and is incontinent of diarrhea stool x2 during assessment. This RN cleaned her entire perineum of old diarrhea stool as well as incontinent urine  And dressed her in clean hospital underwear and gowns  She is unclear to the etiology of her previously documented allergy to ace inhibitors ( chart shows coughs when she takes them), she is able to share that she has a hx / of HTN, depression, alcohol abuse ( " since I got out of college...when I was in my 20's", thyroid disease ( " they irradiated it ) , torn left knee cartilage and long history of bulemia. At this time, admission is completed. Pt is very unsteady on her feet, she is given red socks,  Call bell is placed at her bedside,  sineage placed on her door and MHT's on her hall are notified ( as well as RN, TD, that receives pt to care for ). Orders are released and verbal reprots given to oncoming nurse , TD.
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
## 2012-06-02 DIAGNOSIS — F1994 Other psychoactive substance use, unspecified with psychoactive substance-induced mood disorder: Secondary | ICD-10-CM

## 2012-06-02 DIAGNOSIS — F1020 Alcohol dependence, uncomplicated: Secondary | ICD-10-CM

## 2012-06-02 NOTE — Progress Notes (Signed)
Patient ID: Mary Graves, female   DOB: [birth year], 66 y.o.   MRN: [id number] She has been up and to groups interacting with peers and staff. Self Inventory: depression 9 , hopelessness 4, denies SI thoughts c/o dizziness and pain this AM but did not request  Pain medication. She spoke of not being able to get close to anyone seine her father left the family when she was 77 and that side of the family disowned them and her side of the family also disowned them because her mother had gotten a divorce.

## 2012-06-02 NOTE — Progress Notes (Signed)
Psychoeducational Group Note  Date:  03/10/2012 Time: 1015  Group Topic/Focus:  Identifying Needs:   The focus of this group is to help patients identify their personal needs that have been historically problematic and identify healthy behaviors to address their needs.  Participation Level:  active Participation Quality: good Affect: flat Cognitive:    Insight:  good  Engagement in Group: engaged  Additional Comments: Pt was engaged in group, asked appropriate questions, shared personal life experience with the group and demonstrates willingness to process and understand her problems. PDuke RN BC   

## 2012-06-02 NOTE — Clinical Social Work Psych Note (Signed)
[facility] Group Notes:  (Counselor/Nursing/MHT/Case Management/Adjunct)  06/02/2012   Type of Therapy:  Group Therapy  Participation Level:  Active  Participation Quality:  Appropriate  Affect:  Appropriate  Cognitive:  Alert  Insight:  Good  Engagement in Group:  Good  Engagement in Therapy:  Good  Modes of Intervention:  Socialization, Support, Clarification and Education  Summary of Progress/Problems: The main focus of the process group therapy was for the patient to process self sabotaging and enabling behaviors that hinders their ability to to meet their goals and remain on a healthy track.  The patient stated that her behaviors included drinking and self-isolating.   Ronnisha Felber Claudette Laws, [place] 06/02/2012 12:23 PM

## 2012-06-02 NOTE — Progress Notes (Signed)
Psychoeducational Group Note  Date:  06/02/2012 Time:  1520  Group Topic/Focus:  Living a life of Gratitude  Participation Level:  Did Not Attend   Dalia Heading 06/02/2012, 5:50 PM

## 2012-06-02 NOTE — Progress Notes (Signed)
[facility] MD Progress Note  06/02/2012 9:57 AM Diego Chahal  MRN:  [id number]  Diagnosis:   Axis I: Alcohol Abuse and Major Depression, Recurrent severe Axis II: Deferred Axis III:  Past Medical History  Diagnosis Date  . Hypertension   . Hypothyroidism    Subjective: Mary Graves reports she is doing "very well." She denies any withdrawal symptoms or cravings for alcohol. She denies any suicidal or homicidal ideation. She denies any auditory or visual hallucinations, but does report that while sleeping she may have a sensation as if her cat is walking on her leg. She reports that she has been sleeping well and her appetite is good. She expresses a desire to attend an intensive outpatient program such as the [facility].  ADL's:  Intact  Sleep: Good  Appetite:  Good  Suicidal Ideation:  Patient denies any thought, plan, or intent. Homicidal Ideation:  Patient denies any thought, plan, or intent.  AEB (as evidenced by):  Mental Status Examination/Evaluation: Objective:  Appearance: Disheveled  Eye Contact::  Good  Speech:  Clear and Coherent and Slow  Volume:  Decreased  Mood:  Dysphoric  Affect:  Congruent  Thought Process:  Linear  Orientation:  Full  Thought Content:  WDL  Suicidal Thoughts:  No  Homicidal Thoughts:  No  Memory:  Immediate;   Good Recent;   Good Remote;   Good  Judgement:  Fair  Insight:  Fair  Psychomotor Activity:  Decreased and Tremor  Concentration:  Good  Recall:  Good  Akathisia:  No  Handed:    AIMS (if indicated):     Assets:  Communication Skills Desire for Improvement  Sleep:  Number of Hours: 5.75    Vital Signs:Blood pressure 137/73, pulse 66, temperature 97.2 F (36.2 C), temperature source Oral, resp. rate 18, height 5\' 6"  (1.676 m), weight 53.524 kg (118 lb), SpO2 95.00%. Current Medications: Current Facility-Administered Medications  Medication Dose Route Frequency Provider Last Rate Last Dose  . acetaminophen (TYLENOL) tablet 650  mg  650 mg Oral Q6H PRN Caroleen Hamman, NP      . alum & mag hydroxide-simeth (MAALOX/MYLANTA) 200-200-20 MG/5ML suspension 30 mL  30 mL Oral Q4H PRN Caroleen Hamman, NP      . chlordiazePOXIDE (LIBRIUM) capsule 25 mg  25 mg Oral Q6H PRN Caroleen Hamman, NP   25 mg at 06/01/12 2121  . chlordiazePOXIDE (LIBRIUM) capsule 25 mg  25 mg Oral QID Caroleen Hamman, NP   25 mg at 06/01/12 2200   Followed by  . chlordiazePOXIDE (LIBRIUM) capsule 25 mg  25 mg Oral TID Caroleen Hamman, NP   25 mg at 06/02/12 0849   Followed by  . chlordiazePOXIDE (LIBRIUM) capsule 25 mg  25 mg Oral BH-qamhs Caroleen Hamman, NP       Followed by  . chlordiazePOXIDE (LIBRIUM) capsule 25 mg  25 mg Oral Daily Caroleen Hamman, NP      . feeding supplement (RESOURCE BREEZE) liquid 1 Container  1 Container Oral QPC supper Anastasio Champion, RD   1 Container at 06/01/12 1723  . hydrOXYzine (ATARAX/VISTARIL) tablet 25 mg  25 mg Oral Q6H PRN Caroleen Hamman, NP   25 mg at 06/01/12 2121  . levothyroxine (SYNTHROID, LEVOTHROID) tablet 125 mcg  125 mcg Oral QAC breakfast Caroleen Hamman, NP   125 mcg at 06/02/12 0603  . loperamide (IMODIUM) capsule 2-4 mg  2-4 mg Oral PRN Caroleen Hamman, NP   4 mg at 06/01/12 1215  . magnesium hydroxide (MILK  OF MAGNESIA) suspension 30 mL  30 mL Oral Daily PRN Caroleen Hamman, NP      . multivitamin with minerals tablet 1 tablet  1 tablet Oral Daily Caroleen Hamman, NP   1 tablet at 06/02/12 0849  . ondansetron (ZOFRAN-ODT) 4 MG disintegrating tablet           . ondansetron (ZOFRAN-ODT) disintegrating tablet 4 mg  4 mg Oral Q6H PRN Caroleen Hamman, NP      . sertraline (ZOLOFT) tablet 100 mg  100 mg Oral Daily Caroleen Hamman, NP   100 mg at 06/02/12 0849  . thiamine (B-1) injection 100 mg  100 mg Intramuscular Once Caroleen Hamman, NP   100 mg at 06/01/12 1232  . thiamine (VITAMIN B-1) tablet 100 mg  100 mg Oral Daily Caroleen Hamman, NP   100 mg at 06/02/12 0849  . DISCONTD: levothyroxine (SYNTHROID, LEVOTHROID) tablet 125 mcg   125 mcg Oral Daily Rachael Fee, MD      . DISCONTD: sertraline (ZOLOFT) tablet 100 mg  100 mg Oral Daily Rachael Fee, MD       Facility-Administered Medications Ordered in Other Encounters  Medication Dose Route Frequency Provider Last Rate Last Dose  . DISCONTD: 0.9 %  sodium chloride infusion  1,000 mL Intravenous Continuous Lyanne Co, MD 125 mL/hr at 06/01/12 0300 1,000 mL at 06/01/12 0300  . DISCONTD: cloNIDine (CATAPRES) tablet 0.1 mg  0.1 mg Oral BID Loren Racer, MD   0.1 mg at 06/01/12 0840  . DISCONTD: folic acid (FOLVITE) tablet 1 mg  1 mg Oral Daily Lyanne Co, MD   1 mg at 05/31/12 2001  . DISCONTD: HYDROmorphone (DILAUDID) injection 1 mg  1 mg Intravenous Once Vida Roller, MD      . DISCONTD: ibuprofen (ADVIL,MOTRIN) tablet 600 mg  600 mg Oral Q8H PRN Lyanne Co, MD      . DISCONTD: LORazepam (ATIVAN) injection 1 mg  1 mg Intravenous Q6H PRN Lyanne Co, MD      . DISCONTD: LORazepam (ATIVAN) tablet 0-4 mg  0-4 mg Oral Q6H Lyanne Co, MD   2 mg at 06/01/12 0840  . DISCONTD: LORazepam (ATIVAN) tablet 0-4 mg  0-4 mg Oral Q12H Lyanne Co, MD      . DISCONTD: LORazepam (ATIVAN) tablet 1 mg  1 mg Oral Q6H PRN Lyanne Co, MD      . DISCONTD: multivitamin with minerals tablet 1 tablet  1 tablet Oral Daily Lyanne Co, MD   1 tablet at 05/31/12 2001  . DISCONTD: ondansetron (ZOFRAN) 4 MG/2ML injection           . DISCONTD: ondansetron (ZOFRAN) tablet 4 mg  4 mg Oral Q8H PRN Lyanne Co, MD   4 mg at 06/01/12 0840  . DISCONTD: potassium chloride SA (K-DUR,KLOR-CON) CR tablet 40 mEq  40 mEq Oral BID Lyanne Co, MD   40 mEq at 06/01/12 0017  . DISCONTD: thiamine (B-1) injection 100 mg  100 mg Intravenous Daily Lyanne Co, MD      . DISCONTD: thiamine (VITAMIN B-1) tablet 100 mg  100 mg Oral Daily Lyanne Co, MD   100 mg at 05/31/12 2001  . DISCONTD: zolpidem (AMBIEN) tablet 5 mg  5 mg Oral QHS PRN Lyanne Co, MD        Lab Results:    Results for orders placed during the hospital encounter of 05/31/12 (from the past 48  hour(s))  CBC     Status: Abnormal   Collection Time   05/31/12  7:05 PM      Component Value Range Comment   WBC 5.5  4.0 - 10.5 K/uL    RBC 3.94  3.87 - 5.11 MIL/uL    Hemoglobin 12.4  12.0 - 15.0 g/dL    HCT 16.1 (*) 09.6 - 46.0 %    MCV 88.6  78.0 - 100.0 fL    MCH 31.5  26.0 - 34.0 pg    MCHC 35.5  30.0 - 36.0 g/dL    RDW 04.5  40.9 - 81.1 %    Platelets 105 (*) 150 - 400 K/uL   COMPREHENSIVE METABOLIC PANEL     Status: Abnormal   Collection Time   05/31/12  7:05 PM      Component Value Range Comment   Sodium 132 (*) 135 - 145 mEq/L    Potassium 3.1 (*) 3.5 - 5.1 mEq/L    Chloride 91 (*) 96 - 112 mEq/L    CO2 30  19 - 32 mEq/L    Glucose, Bld 102 (*) 70 - 99 mg/dL    BUN 13  6 - 23 mg/dL    Creatinine, Ser 9.14  0.50 - 1.10 mg/dL    Calcium 8.5  8.4 - 78.2 mg/dL    Total Protein 6.4  6.0 - 8.3 g/dL    Albumin 3.4 (*) 3.5 - 5.2 g/dL    AST 32  0 - 37 U/L    ALT 12  0 - 35 U/L    Alkaline Phosphatase 53  39 - 117 U/L    Total Bilirubin 0.4  0.3 - 1.2 mg/dL    GFR calc non Af Amer 68 (*) >90 mL/min    GFR calc Af Amer 79 (*) >90 mL/min   ETHANOL     Status: Abnormal   Collection Time   05/31/12  7:05 PM      Component Value Range Comment   Alcohol, Ethyl (B) 294 (*) 0 - 11 mg/dL   LIPASE, BLOOD     Status: Abnormal   Collection Time   05/31/12  7:05 PM      Component Value Range Comment   Lipase 124 (*) 11 - 59 U/L   URINE RAPID DRUG SCREEN (HOSP PERFORMED)     Status: Abnormal   Collection Time   05/31/12  7:50 PM      Component Value Range Comment   Opiates NONE DETECTED  NONE DETECTED    Cocaine NONE DETECTED  NONE DETECTED    Benzodiazepines POSITIVE (*) NONE DETECTED    Amphetamines NONE DETECTED  NONE DETECTED    Tetrahydrocannabinol NONE DETECTED  NONE DETECTED    Barbiturates NONE DETECTED  NONE DETECTED   ETHANOL     Status: Abnormal   Collection Time   06/01/12  12:20 AM      Component Value Range Comment   Alcohol, Ethyl (B) 172 (*) 0 - 11 mg/dL     Physical Findings: AIMS: Facial and Oral Movements Muscles of Facial Expression: None, normal Lips and Perioral Area: None, normal Jaw: None, normal Tongue: None, normal,Extremity Movements Upper (arms, wrists, hands, fingers): None, normal Lower (legs, knees, ankles, toes): None, normal, Trunk Movements Neck, shoulders, hips: None, normal, Overall Severity Severity of abnormal movements (highest score from questions above): None, normal Incapacitation due to abnormal movements: None, normal Patient's awareness of abnormal movements (rate only patient's report): No Awareness, Dental Status Current  problems with teeth and/or dentures?: Yes Does patient usually wear dentures?: No (pt states teeth are capped)  CIWA:  CIWA-Ar Total: 6  COWS:  COWS Total Score: 8   Treatment Plan Summary: Daily contact with patient to assess and evaluate symptoms and progress in treatment Medication management  Plan: We will continue her safe medical detox, with her planned last dose of Librium on Monday, November 4. We will research possible options for residential or intensive outpatient treatment.  Gerene Nedd 06/02/2012, 9:57 AM

## 2012-06-03 NOTE — Progress Notes (Signed)
[facility] MD Progress Note  06/03/2012 10:27 AM Mary Graves  MRN:  [id number]  Diagnosis:   Axis I: Major Depression, Recurrent severe, Substance Induced Mood Disorder and Alcohol dependence Axis II: Deferred Axis III:  Past Medical History  Diagnosis Date  . Hypertension   . Hypothyroidism    Subjective: Mary Graves reports she is doing very well today. She rates her depression as a 2 on a scale of 1-10, 10 being the worst. She rates her anxie as a 5/10. She denies any cravings or withdrawal symptoms. She denies any suicidal or homicidal ideation. She denies any auditory or visual hallucinations. She expresses that she is beginning to think a little more clearly. She describes a plan to get more deeply involved in AA after discharge. This includes increasing her network of women in recovery, and interacting with them.   ADL's:  Intact  Sleep: Fair  Appetite:  Good  Suicidal Ideation:  Patient denies any thought, plan, or intent Homicidal Ideation:  Patient denies any thought, plan, or intent  AEB (as evidenced by):  Mental Status Examination/Evaluation: Objective:  Appearance: Disheveled  Eye Contact::  Good  Speech:  Clear and Coherent  Volume:  Normal  Mood:  Anxious and Depressed  Affect:  Congruent  Thought Process:  Logical  Orientation:  Full  Thought Content:  WDL  Suicidal Thoughts:  No  Homicidal Thoughts:  No  Memory:  Immediate;   Good Recent;   Good Remote;   Good  Judgement:  Fair  Insight:  Fair  Psychomotor Activity:  Psychomotor Retardation  Concentration:  Good  Recall:  Good  Akathisia:  No  Handed:    AIMS (if indicated):     Assets:  Communication Skills Desire for Improvement Resilience  Sleep:  Number of Hours: 3.75    Vital Signs:Blood pressure 144/81, pulse 69, temperature 97.8 F (36.6 C), temperature source Oral, resp. rate 18, height 5\' 6"  (1.676 m), weight 53.524 kg (118 lb), SpO2 95.00%. Current Medications: Current Facility-Administered  Medications  Medication Dose Route Frequency Provider Last Rate Last Dose  . acetaminophen (TYLENOL) tablet 650 mg  650 mg Oral Q6H PRN Caroleen Hamman, NP      . alum & mag hydroxide-simeth (MAALOX/MYLANTA) 200-200-20 MG/5ML suspension 30 mL  30 mL Oral Q4H PRN Caroleen Hamman, NP      . chlordiazePOXIDE (LIBRIUM) capsule 25 mg  25 mg Oral Q6H PRN Caroleen Hamman, NP   25 mg at 06/01/12 2121  . [COMPLETED] chlordiazePOXIDE (LIBRIUM) capsule 25 mg  25 mg Oral TID Caroleen Hamman, NP   25 mg at 06/02/12 1715   Followed by  . chlordiazePOXIDE (LIBRIUM) capsule 25 mg  25 mg Oral BH-qamhs Caroleen Hamman, NP   25 mg at 06/03/12 0830   Followed by  . chlordiazePOXIDE (LIBRIUM) capsule 25 mg  25 mg Oral Daily Caroleen Hamman, NP      . feeding supplement (RESOURCE BREEZE) liquid 1 Container  1 Container Oral QPC supper Anastasio Champion, RD   1 Container at 06/02/12 1715  . hydrOXYzine (ATARAX/VISTARIL) tablet 25 mg  25 mg Oral Q6H PRN Caroleen Hamman, NP   25 mg at 06/01/12 2121  . levothyroxine (SYNTHROID, LEVOTHROID) tablet 125 mcg  125 mcg Oral QAC breakfast Caroleen Hamman, NP   125 mcg at 06/03/12 0700  . loperamide (IMODIUM) capsule 2-4 mg  2-4 mg Oral PRN Caroleen Hamman, NP   4 mg at 06/01/12 1215  . magnesium hydroxide (MILK OF MAGNESIA) suspension 30 mL  30 mL Oral Daily PRN Caroleen Hamman, NP      . multivitamin with minerals tablet 1 tablet  1 tablet Oral Daily Caroleen Hamman, NP   1 tablet at 06/03/12 0830  . ondansetron (ZOFRAN-ODT) disintegrating tablet 4 mg  4 mg Oral Q6H PRN Caroleen Hamman, NP      . sertraline (ZOLOFT) tablet 100 mg  100 mg Oral Daily Caroleen Hamman, NP   100 mg at 06/03/12 0830  . thiamine (VITAMIN B-1) tablet 100 mg  100 mg Oral Daily Caroleen Hamman, NP   100 mg at 06/03/12 9629    Lab Results:  Results for orders placed during the hospital encounter of 06/01/12 (from the past 48 hour(s))  T4, FREE     Status: Abnormal   Collection Time   06/02/12  6:57 AM      Component Value  Range Comment   Free T4 0.66 (*) 0.80 - 1.80 ng/dL   TSH     Status: Abnormal   Collection Time   06/02/12  6:57 AM      Component Value Range Comment   TSH 44.985 (*) 0.350 - 4.500 uIU/mL     Physical Findings: AIMS: Facial and Oral Movements Muscles of Facial Expression: None, normal Lips and Perioral Area: None, normal Jaw: None, normal Tongue: None, normal,Extremity Movements Upper (arms, wrists, hands, fingers): None, normal Lower (legs, knees, ankles, toes): None, normal, Trunk Movements Neck, shoulders, hips: None, normal, Overall Severity Severity of abnormal movements (highest score from questions above): None, normal Incapacitation due to abnormal movements: None, normal Patient's awareness of abnormal movements (rate only patient's report): No Awareness, Dental Status Current problems with teeth and/or dentures?: Yes Does patient usually wear dentures?: No  CIWA:  CIWA-Ar Total: 4  COWS:  COWS Total Score: 8   Treatment Plan Summary: Daily contact with patient to assess and evaluate symptoms and progress in treatment Medication management  Plan: We will continue her safe medical detox, and research options for followup care.   Layani Foronda 06/03/2012, 10:27 AM

## 2012-06-03 NOTE — Progress Notes (Signed)
Psychoeducational Group Note  Date:  06/03/2012 Time:  1315  Group Topic/Focus:  Wellness Toolbox:   The focus of this group is to discuss various aspects of wellness, balancing those aspects and exploring ways to increase the ability to experience wellness.  Patients will create a wellness toolbox for use upon discharge.  Participation Level:  Active  Participation Quality:  Appropriate and Attentive  Affect:  Appropriate  Cognitive:  Alert, Appropriate and Oriented  Insight:  Good  Engagement in Group:  Limited  Additional Comments:  Productive group  Sandria Senter 06/03/2012, 3:36 PM

## 2012-06-03 NOTE — Progress Notes (Signed)
D.  Pt pleasant on approach.  States that she had a "blow out" earlier in the bathroom, states that she had been having constipation but now she has diarrhea.  Pt states that before she came in she had not really eaten for ten days because of drinking and now her body is adjusting to having food in it again.  No acute distress, although she states she had some cramping earlier.  Pt requested ensure and received this.  Denies SI/HI/hallucinations at present.  Minimal withdrawal.  Interacting appropriately within milieu.  A.  Support and encouragement offered.  R.  Currently in group, will continue to monitor.

## 2012-06-03 NOTE — Progress Notes (Signed)
Took over Pt's care at 2330.  Has rested throughout night, respirations even and unlabored, no distress noted.

## 2012-06-03 NOTE — Progress Notes (Signed)
Psychoeducational Group Note  Date:  06/03/2012 Time:  1515  Group Topic/Focus:  Making Healthy Choices:   The focus of this group is to help patients identify negative/unhealthy choices they were using prior to admission and identify positive/healthier coping strategies to replace them upon discharge.  Participation Level:  Active  Participation Quality:  Appropriate  Affect:  Appropriate  Cognitive:  Appropriate  Insight:  Good  Engagement in Group:  Good  Additional Comments:    Cameron Schwinn H 06/03/2012, 6:45 PM

## 2012-06-03 NOTE — Progress Notes (Signed)
D - Patient complained of difficulty sleeping last night, stated "I wanted to stop taking anything for sleep, I finally fell asleep around 2 am". Patient was sleepy this am waking late. Patient noted on self inventory that she was experiencing multiple withdrawal symptoms. Patient complained of having dreams about drinking. Patient rated her depression and hopelessness as 6 out of 10 with 10 being the worst. Patient has attended groups today was pleasant and active in group.  A - Patient giving encouragement and support through therapeutic conversation. Encouraged patient to talk with staff about any concerns or questions.  R - Q 15 minute checks continued to maintain safety.

## 2012-06-03 NOTE — Progress Notes (Signed)
Psychoeducational Group Note  Date:  06/03/2012 Time:  1000  Group Topic/Focus:  Wellness Toolbox:   The focus of this group is to discuss various aspects of wellness, balancing those aspects and exploring ways to increase the ability to experience wellness.  Patients will create a wellness toolbox for use upon discharge.  Participation Level:  Active  Participation Quality:  Appropriate, Attentive, Sharing and Supportive  Affect:  Appropriate  Cognitive:  Alert and Appropriate  Insight:  Good  Engagement in Group:  Good  Additional Comments:  Productive group  Earline Mayotte 06/03/2012, 3:33 PM

## 2012-06-04 MED ORDER — CHLORDIAZEPOXIDE HCL 25 MG PO CAPS
25.0000 mg | ORAL_CAPSULE | Freq: Once | ORAL | Status: AC
  Administered 2012-06-04: 25 mg via ORAL

## 2012-06-04 MED ORDER — CHLORDIAZEPOXIDE HCL 25 MG PO CAPS
ORAL_CAPSULE | ORAL | Status: AC
  Administered 2012-06-04: 25 mg via ORAL
  Filled 2012-06-04: qty 1

## 2012-06-04 NOTE — Progress Notes (Addendum)
Error note/wrong patient

## 2012-06-04 NOTE — Tx Team (Addendum)
Interdisciplinary Treatment Plan Update (Adult)  Date:  06/04/2012  Time Reviewed:  10:12 AM   Progress in Treatment: Attending groups: Yes Participating in groups:  Yes Taking medication as prescribed: Yes Tolerating medication:  Yes Family/Significant othe contact made:  CSW will make attempts Patient understands diagnosis:  Yes Discussing patient identified problems/goals with staff:  Yes Medical problems stabilized or resolved:  Yes Denies suicidal/homicidal ideation: Yes Issues/concerns per patient self-inventory:  None identified Other: N/A  New problem(s) identified: None Identified  Reason for Continuation of Hospitalization: Anxiety Depression Medication stabilization Withdrawal symptoms  Interventions implemented related to continuation of hospitalization: mood stabilization, medication monitoring and adjustment, group therapy and psycho education, safety checks q 15 mins  Additional comments: N/A  Estimated length of stay: 3-5 days  Discharge Plan: SW is assessing for appropriate referrals.    New goal(s): N/A  Review of initial/current patient goals per problem list:    1.  Goal(s): Address substance use  Met:  No  Target date: by discharge  As evidenced by: completing detox protocol and refer to appropriate treatment  2.  Goal (s): Reduce depressive and anxiety symptoms  Met:  No  Target date: by discharge  As evidenced by: Reducing depression from a 10 to a 3 as reported by pt.    Attendees: Patient: Mary Graves  06/04/2012 9:51 AM   Family:     Physician: Geoffery Lyons, MD 06/04/2012 9:48 AM   Nursing: Roswell Miners, RN 06/04/2012 9:48 AM   Clinical Social Worker:  Reyes Ivan, LCSWA 06/04/2012  9:48 AM   Other: Alease Frame, RN 06/04/2012  9:48 AM   Other:  Bubba Camp, psyc intern 06/04/2012 9:52 AM   Other:  Nanine Means, NP 06/04/2012 9:52 AM   Other:     Other:      Scribe for Treatment Team:   Reyes Ivan 06/04/2012 9:48 AM

## 2012-06-04 NOTE — Progress Notes (Signed)
[facility] MD Progress Note  06/04/2012 2:09 PM Khadija Thier  MRN:  [id number]  Diagnosis:   Axis I: Alcohol Dependence, Major Depression Axis II: Deferred Axis III:  Past Medical History  Diagnosis Date  . Hypertension   . Hypothyroidism    Axis IV: economic problems, occupational problems and problems with primary support group Axis V: 51-60 moderate symptoms  ADL's:  Intact  Sleep: Fair  Appetite:  Fair  Suicidal Ideation:  Plan:  Denies Intent:  Denies Means:  Denies Homicidal Ideation:  Plan:  Denies Intent:  Denies Means:  Denies  Auda was initially wanting to leave today. She planned to go into a half way house sometime this week  to be around people in recovery. 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 She later found out that they fired her. She is very upset, tearful not sure what to do. She has limited support as she is self confessed "loner." Concerned about finances. 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 worried  Thought Process:  Coherent and Goal Directed  Orientation:  Full  Thought Content:  WDL  Suicidal Thoughts:  No  Homicidal Thoughts:  No  Memory:  Immediate;   Fair Recent;   Fair Remote;   Fair  Judgement:  Intact  Insight:  Fair  Psychomotor Activity:  Decreased  Concentration:  Fair  Recall:  Fair  Akathisia:  No  Handed:  Right  AIMS (if indicated):     Assets:  Communication Skills Desire for Improvement Vocational/Educational  Sleep:  Number of Hours: 6.25    Vital Signs:Blood pressure 106/67, pulse 81, temperature 98.1 F (36.7 C), temperature  source Oral, resp. rate 18, height 5\' 6"  (1.676 m), weight 53.524 kg (118 lb), SpO2 95.00%. Current Medications: Current Facility-Administered Medications  Medication Dose Route Frequency Provider Last Rate Last Dose  . acetaminophen (TYLENOL) tablet 650 mg  650 mg Oral Q6H PRN Caroleen Hamman, NP      . alum & mag hydroxide-simeth (MAALOX/MYLANTA) 200-200-20 MG/5ML suspension 30 mL  30 mL Oral Q4H PRN Caroleen Hamman, NP      . [EXPIRED] chlordiazePOXIDE (LIBRIUM) capsule 25 mg  25 mg Oral Q6H PRN Caroleen Hamman, NP   25 mg at 06/01/12 2121  . [COMPLETED] chlordiazePOXIDE (LIBRIUM) capsule 25 mg  25 mg Oral BH-qamhs Caroleen Hamman, NP   25 mg at 06/03/12 2137   Followed by  . [COMPLETED] chlordiazePOXIDE (LIBRIUM) capsule 25 mg  25 mg Oral Daily Caroleen Hamman, NP   25 mg at 06/04/12 0811  . feeding supplement (RESOURCE BREEZE) liquid 1 Container  1 Container Oral QPC supper Anastasio Champion, RD   1 Container at 06/02/12 1715  . [EXPIRED] hydrOXYzine (ATARAX/VISTARIL) tablet 25 mg  25 mg Oral Q6H PRN Caroleen Hamman, NP   25 mg at 06/01/12 2121  . levothyroxine (SYNTHROID, LEVOTHROID) tablet 125 mcg  125 mcg Oral QAC breakfast Caroleen Hamman, NP   125 mcg at 06/04/12 0604  . [EXPIRED] loperamide (IMODIUM) capsule 2-4 mg  2-4 mg Oral PRN Caroleen Hamman, NP   4 mg at 06/01/12  1215  . magnesium hydroxide (MILK OF MAGNESIA) suspension 30 mL  30 mL Oral Daily PRN Caroleen Hamman, NP      . multivitamin with minerals tablet 1 tablet  1 tablet Oral Daily Caroleen Hamman, NP   1 tablet at 06/04/12 0811  . [EXPIRED] ondansetron (ZOFRAN-ODT) disintegrating tablet 4 mg  4 mg Oral Q6H PRN Caroleen Hamman, NP      . sertraline (ZOLOFT) tablet 100 mg  100 mg Oral Daily Caroleen Hamman, NP   100 mg at 06/04/12 1610  . thiamine (VITAMIN B-1) tablet 100 mg  100 mg Oral Daily Caroleen Hamman, NP   100 mg at 06/04/12 9604    Lab Results: No results found for this or any previous visit (from the past 48 hour(s)).  Physical  Findings: AIMS: Facial and Oral Movements Muscles of Facial Expression: None, normal Lips and Perioral Area: None, normal Jaw: None, normal Tongue: None, normal,Extremity Movements Upper (arms, wrists, hands, fingers): None, normal Lower (legs, knees, ankles, toes): None, normal, Trunk Movements Neck, shoulders, hips: None, normal, Overall Severity Severity of abnormal movements (highest score from questions above): None, normal Incapacitation due to abnormal movements: None, normal Patient's awareness of abnormal movements (rate only patient's report): No Awareness, Dental Status Current problems with teeth and/or dentures?: Yes Does patient usually wear dentures?: No  CIWA:  CIWA-Ar Total: 1  COWS:  COWS Total Score: 8   Treatment Plan Summary: Daily contact with patient to assess and evaluate symptoms and progress in treatment Medication management  Plan: Supportive approach/coping skills/relapse prevention           Will extend her stay at least one more day as she is too fragile right now given the little support and losing her job  Mackenzey Crownover A 06/04/2012, 2:09 PM

## 2012-06-04 NOTE — Social Work (Signed)
Aftercare Planning Group: 06/04/2012 9:45 AM  Pt attended discharge planning group and actively participated in group.  CSW provided pt with today's workbook.  Pt presents with calm mood and affect.  Pt states that she feels better today.  Pt rates depression and anxiety at a 0 today.  Pt denies SI/HI today.  Pt shared with group that she came to the hospital to detox from alcohol.  Pt states that she is worried about getting fired from her job, which pt checked later on this and found out that she was terminated.  Pt states that she plans to return home and is looking into staying at an [place] [facility] for a month or so for added support.  Pt states that she will follow up with [facility] for medication management.  SW secured pt's follow up.  No further needs voiced by pt at this time.  Safety planning and suicide prevention discussed.  Pt participated in discussion and acknowledged an understanding of the information provided.       [facility] Group Note : Clinical Social Worker Group Therapy  06/04/2012  1:15 PM  Type of Therapy:  Group Therapy  Participation Level:  Appropriate  Participation Quality:  Appropriate   Affect:  Anxious  Cognitive:  Alert  Insight:  Good  Engagement in Group:  Good  Engagement in Therapy:  Good  Modes of Intervention:  Clarification, Education, Problem-solving, Socialization and Support  Summary of Progress/Problems: The topic for group today was overcoming obstacles.  Pt discussed overcoming obstacles and what this means for pt. Pt shared with the group that she had a traumatic incident occur when she was 66 years old and it has caused her to lose faith in people and God, and has lost trust of all mankind.  Pt states that she has been isolative since then and doesn't trust people.  Pt states that this is her biggest obstacle and is still working on overcoming it.       Reyes Ivan, [place] 06/04/2012 12:36 PM

## 2012-06-04 NOTE — Progress Notes (Signed)
D slept fair last nite, appetite is good, energy level is low to normal and ability to pay attention is good, depressed 5/10 and hopeless 5/10, WD s/s tremors, sedation, diarrhea, chilling, cravings and agitation, denies SI or HI, also c/o lightheaded, dizziness, headaches, rash, blurred vision and pain, patient states she is not in pain, upset this morning when told she needed to stay one more day. Taking meds as ordered by MD, attending group, interacting w/peers. A q56mins safety checks continue and support offered, encouraged to continue to go to group and participate and continue taking her meds so she is ready to go home tomorrow, sad about not going but understands what Dr Dub Mikes is saying to her and is willing to wait. R patient remains safe on the unit

## 2012-06-04 NOTE — Care Management Utilization Note (Signed)
Per State Regulation 482.30  This chart was reviewed for medical necessity with respect to the patient's Admission/Duration of stay.   Next review due:  05/07/12   Ambrose Mantle, LCSW  06/04/2012  10:01 AM

## 2012-06-04 NOTE — Progress Notes (Addendum)
Psychoeducational Group Note  Date:  06/04/2012 Time:  1100  Group Topic/Focus:  Self Care:   The focus of this group is to help patients understand the importance of self-care in order to improve or restore emotional, physical, spiritual, interpersonal, and financial health.  Participation Level:  Minimal  Participation Quality:  Appropriate and Attentive  Affect:  Appropriate  Cognitive:  Appropriate  Insight:  Good  Engagement in Group:  Limited  Additional Comments:  Patient attend group on self care. Patient was asked to define self care in personal terms, and then was given the definition of self care. Patient was given self care assessment from Wellness workbook and rated the sections of physical, psychological, emotional, spiritual, and relationship care of areas that they do well in, their strengths in those area and weakness in areas. Patient stated one area that they would be willing to improve and make a goal for that area.    Karleen Hampshire Brittini 06/04/2012, 1:40 PM

## 2012-06-04 NOTE — Progress Notes (Signed)
[facility] MD Progress Note  06/04/2012 1:31 PM Mary Graves  MRN:  [id number]  Diagnosis:   Axis I: Major Depression, Alcohol Abuse R/O Dependence Axis II: Deferred Axis III:  Past Medical History  Diagnosis Date  . Hypertension   . Hypothyroidism    Axis IV: problems with primary support group Axis V: 51-60 moderate symptoms  ADL's:  Intact  Sleep: Fair  Appetite:  Fair  Suicidal Ideation:  Plan:  Denies Intent:  Denies Means:  Denies Homicidal Ideation:  Plan:  Denies Intent:  Denies Means:  Denies  Her husband came to visit. She claims that over the phone he sounded as he was crying. She was surprised as she claims he usually does not show emotions like that. He came to visit and initially seemed to be supportive but shortly afterwards she felt he went back to his old ways. Does not feel supported. She still minimizes her alcohol use. She claims she uses the alcohol to cope with the anger (frustration.) She is worried about her blood pressure. She does not feel that her current provider is able to control her BP. She would like to have someone to take a look at her medications and change them. Concerned because the fact she had the seizure and the stroke.  Mental Status Examination/Evaluation: Objective:  Appearance: Fairly Groomed  Patent attorney::  Fair  Speech:  Clear and Coherent and Slow  Volume:  Decreased  Mood:  Depressed,Worried  Affect:  Restricted  Thought Process:  Coherent, Goal Directed, Intact and Linear  Orientation:  Full  Thought Content:  WDL  Suicidal Thoughts:  No  Homicidal Thoughts:  No  Memory:  Immediate;   Fair Recent;   Fair Remote;   Fair  Judgement:  Intact  Insight:  Present  Psychomotor Activity:  Normal  Concentration:  Fair  Recall:  Fair  Akathisia:  No  Handed:  Right  AIMS (if indicated):     Assets:  Community education officer Vocational/Educational  Sleep:  Number of Hours: 6.25    Vital Signs:Blood  pressure 106/67, pulse 81, temperature 98.1 F (36.7 C), temperature source Oral, resp. rate 18, height 5\' 6"  (1.676 m), weight 53.524 kg (118 lb), SpO2 95.00%. Current Medications: Current Facility-Administered Medications  Medication Dose Route Frequency Provider Last Rate Last Dose  . acetaminophen (TYLENOL) tablet 650 mg  650 mg Oral Q6H PRN Caroleen Hamman, NP      . alum & mag hydroxide-simeth (MAALOX/MYLANTA) 200-200-20 MG/5ML suspension 30 mL  30 mL Oral Q4H PRN Caroleen Hamman, NP      . [EXPIRED] chlordiazePOXIDE (LIBRIUM) capsule 25 mg  25 mg Oral Q6H PRN Caroleen Hamman, NP   25 mg at 06/01/12 2121  . [COMPLETED] chlordiazePOXIDE (LIBRIUM) capsule 25 mg  25 mg Oral BH-qamhs Caroleen Hamman, NP   25 mg at 06/03/12 2137   Followed by  . [COMPLETED] chlordiazePOXIDE (LIBRIUM) capsule 25 mg  25 mg Oral Daily Caroleen Hamman, NP   25 mg at 06/04/12 0811  . feeding supplement (RESOURCE BREEZE) liquid 1 Container  1 Container Oral QPC supper Anastasio Champion, RD   1 Container at 06/02/12 1715  . [EXPIRED] hydrOXYzine (ATARAX/VISTARIL) tablet 25 mg  25 mg Oral Q6H PRN Caroleen Hamman, NP   25 mg at 06/01/12 2121  . levothyroxine (SYNTHROID, LEVOTHROID) tablet 125 mcg  125 mcg Oral QAC breakfast Caroleen Hamman, NP   125 mcg at 06/04/12 0604  . [EXPIRED] loperamide (IMODIUM) capsule 2-4 mg  2-4  mg Oral PRN Caroleen Hamman, NP   4 mg at 06/01/12 1215  . magnesium hydroxide (MILK OF MAGNESIA) suspension 30 mL  30 mL Oral Daily PRN Caroleen Hamman, NP      . multivitamin with minerals tablet 1 tablet  1 tablet Oral Daily Caroleen Hamman, NP   1 tablet at 06/04/12 0811  . [EXPIRED] ondansetron (ZOFRAN-ODT) disintegrating tablet 4 mg  4 mg Oral Q6H PRN Caroleen Hamman, NP      . sertraline (ZOLOFT) tablet 100 mg  100 mg Oral Daily Caroleen Hamman, NP   100 mg at 06/04/12 1610  . thiamine (VITAMIN B-1) tablet 100 mg  100 mg Oral Daily Caroleen Hamman, NP   100 mg at 06/04/12 9604    Lab Results: No results found for this  or any previous visit (from the past 48 hour(s)).  Physical Findings: AIMS: Facial and Oral Movements Muscles of Facial Expression: None, normal Lips and Perioral Area: None, normal Jaw: None, normal Tongue: None, normal,Extremity Movements Upper (arms, wrists, hands, fingers): None, normal Lower (legs, knees, ankles, toes): None, normal, Trunk Movements Neck, shoulders, hips: None, normal, Overall Severity Severity of abnormal movements (highest score from questions above): None, normal Incapacitation due to abnormal movements: None, normal Patient's awareness of abnormal movements (rate only patient's report): No Awareness, Dental Status Current problems with teeth and/or dentures?: Yes Does patient usually wear dentures?: No  CIWA:  CIWA-Ar Total: 1  COWS:  COWS Total Score: 8   Treatment Plan Summary: Daily contact with patient to assess and evaluate symptoms and progress in treatment Medication management Relapse Prevention  Plan: Stabilize her blood pressure           Work on anger management  Francesa Eugenio A 06/04/2012, 1:31 PM

## 2012-06-05 MED ORDER — THIAMINE HCL 100 MG PO TABS: 100.0000 mg | ORAL_TABLET | Freq: Every day | ORAL | Status: DC

## 2012-06-05 MED ORDER — LEVOTHYROXINE SODIUM 125 MCG PO TABS: 125.0000 ug | ORAL_TABLET | Freq: Every day | ORAL | Status: DC

## 2012-06-05 MED ORDER — SERTRALINE HCL 100 MG PO TABS: 100.0000 mg | ORAL_TABLET | Freq: Every day | ORAL | Status: DC

## 2012-06-05 NOTE — Progress Notes (Signed)
[facility] Group Notes:  (Counselor/Nursing/MHT/Case Management/Adjunct)  06/05/2012 12:37 PM  Type of Therapy:  Psychoeducational Skills  Participation Level:  Active  Participation Quality:  Appropriate, Attentive and Sharing  Affect:  Appropriate  Cognitive:  Alert, Appropriate and Oriented  Insight:  Good  Engagement in Group:  Good  Engagement in Therapy:  n/a  Modes of Intervention:  Activity, Education, Problem-solving, Socialization and Support  Summary of Progress/Problems: Mary Graves attended Psychoeducational group that focused on using quality time with support systems/individuals to engage in healthy coping skills. Mary Graves participated in activity guessing about self and peers. Mary Graves was active while group discussed who their support systems are, how they can spend positive quality time with them as a coping skills and a way to strengthen their relationship. Mary Graves was given a homework assignment to find two ways to improve her support systems and twenty activities she can do to spend quality time with her supports.    Wandra Scot 06/05/2012, 12:37 PM

## 2012-06-05 NOTE — [facility] Suicide Risk Assessment (Signed)
Suicide Risk Assessment  Discharge Assessment     Demographic Factors:  Age 66 or older, Divorced or widowed, Caucasian and Living alone  Mental Status Per Nursing Assessment::   On Admission:  NA  Current Mental Status by Physician: Denies Suicidal ideas, plans or intent. She is wanting to be in a half way house. She is working on it. She is committed to abstinence. She is looking at different options for work. She can make it on her retirement/SS income, but still would like to have something extra. She is optimistic. She has support out there. She needs to access her support system.    Loss Factors: Decrease in vocational status  Historical Factors: NA  Risk Reduction Factors:   Positive coping skills or problem solving skills  Continued Clinical Symptoms:  Depression:   Comorbid alcohol abuse/dependence  Cognitive Features That Contribute To Risk: None   Suicide Risk:  Minimal: No identifiable suicidal ideation.  Patients presenting with no risk factors but with morbid ruminations; may be classified as minimal risk based on the severity of the depressive symptoms  Discharge Diagnoses:   AXIS I:  Alcohol Dependence, Major Depressive Disorder AXIS II:  Deferred AXIS III:   Past Medical History  Diagnosis Date  . Hypertension   . Hypothyroidism    AXIS IV:  occupational problems AXIS V:  61-70 mild symptoms  Plan Of Care/Follow-up recommendations:  Activity:  As tolerated Diet:  Regular Will pursue follow up at [facility]  Is patient on multiple antipsychotic therapies at discharge:  No   Has Patient had three or more failed trials of antipsychotic monotherapy by history:  No  Recommended Plan for Multiple Antipsychotic Therapies: N/A  Momen Ham A 06/05/2012, 12:27 PM

## 2012-06-05 NOTE — Progress Notes (Signed)
[facility] Case Management Discharge Plan:  Will you be returning to the same living situation after discharge: Yes,  returning home At discharge, do you have transportation home?:Yes,  access to transportation Do you have the ability to pay for your medications:Yes,  access to meds   Release of information consent forms completed and in the chart;  Patient's signature needed at discharge.  Patient to Follow up at:  Follow-up Information    Follow up with [facility]. On 06/12/2012. (Appointment scheduled at 11:40 am with Dr. Shawnee Knapp)    Contact information:   [address] [place], [place] [address] [phone]         Patient denies SI/HI:   Yes,  denies SI/HI    Safety Planning and Suicide Prevention discussed:  Yes,  discussed with pt today  Barrier to discharge identified:No.  Summary and Recommendations: Pt attended discharge planning group and actively participated in group.  CSW provided pt with today's workbook.  Pt presents with calm mood and affect.  Pt rates depression and anxiety at a 2-3 today.  Pt denies SI/HI.  Pt reports feeling stable to d/c today.  No recommendations from SW.  No further needs voiced by pt.  Pt stable to discharge.     Carmina Miller 06/05/2012, 9:47 AM

## 2012-06-05 NOTE — Progress Notes (Signed)
[facility] Adult Inpatient Family/Significant Other Suicide Prevention Education  Suicide Prevention Education:  Education Completed; Jyl Heinz - sister [phone]),  (name of family member/significant other) has been identified by the patient as the family member/significant other with whom the patient will be residing, and identified as the person(s) who will aid the patient in the event of a mental health crisis (suicidal ideations/suicide attempt).  With written consent from the patient, the family member/significant other has been provided the following suicide prevention education, prior to the and/or following the discharge of the patient.  The suicide prevention education provided includes the following:  Suicide risk factors  Suicide prevention and interventions  National Suicide Hotline telephone number  [facility] assessment telephone number  [facility] Emergency Assistance 911  [facility] and/or Residential Mobile Crisis Unit telephone number  Request made of family/significant other to:  Remove weapons (e.g., guns, rifles, knives), all items previously/currently identified as safety concern.    Remove drugs/medications (over-the-counter, prescriptions, illicit drugs), all items previously/currently identified as a safety concern.  The family member/significant other verbalizes understanding of the suicide prevention education information provided.  The family member/significant other agrees to remove the items of safety concern listed above.  Carmina Miller 06/05/2012, 8:29 AM

## 2012-06-05 NOTE — Progress Notes (Signed)
Patient ID: Mary Graves, female   DOB: [birth year], 66 y.o.   MRN: [id number] Patient denies SI/HI and A/V hallucinations; patient was given prescriptions and copy of AVS after it was reviewed with her and patient was able to verbalized her follow up appointment and exact time and date; patient did not have any questions or concerns at this time; patient was ambulatory and called taxi for ride home

## 2012-06-05 NOTE — Discharge Summary (Signed)
Physician Discharge Summary Note  Patient:  Mary Graves is an 66 y.o., female MRN:  [id number] DOB:  10-21-1945 Patient phone:  [phone] (home)  Patient address:   [address]  [place] [address],   Date of Admission:  06/01/2012 Date of Discharge: 06/05/2012  Reason for Admission:  Alcohol detox and depression  Discharge Diagnoses: Active Problems:  Alcohol dependence  Major depression  Axis Diagnosis:  AXIS I:  Alcohol Abuse and Major Depression, Recurrent severe AXIS II:  Deferred AXIS III:   Past Medical History  Diagnosis Date  . Hypertension   . Hypothyroidism    AXIS IV:  economic problems, occupational problems, other psychosocial or environmental problems, problems related to social environment and problems with primary support group AXIS V:  61-70 mild symptoms  Level of Care:  OP  Hospital Course:   Patient attended individual and group therapy while inpatient along with attending AA groups, one-one time with MD daily, medications for detox managed during inpatient, follow-up appointments made prior to discharge   Consults:  None  Significant Diagnostic Studies:  labs: Completed in ED, reviewed, stable  Discharge Vitals:   Blood pressure 138/82, pulse 66, temperature 97.7 F (36.5 C), temperature source Oral, resp. rate 16, height 5\' 6"  (1.676 m), weight 53.524 kg (118 lb), SpO2 95.00%. Lab Results:   No results found for this or any previous visit (from the past 72 hour(s)).  Physical Findings: AIMS: Facial and Oral Movements Muscles of Facial Expression: None, normal Lips and Perioral Area: None, normal Jaw: None, normal Tongue: None, normal,Extremity Movements Upper (arms, wrists, hands, fingers): None, normal Lower (legs, knees, ankles, toes): None, normal, Trunk Movements Neck, shoulders, hips: None, normal, Overall Severity Severity of abnormal movements (highest score from questions above): None, normal Incapacitation due to  abnormal movements: None, normal Patient's awareness of abnormal movements (rate only patient's report): No Awareness, Dental Status Current problems with teeth and/or dentures?: Yes Does patient usually wear dentures?: No  CIWA:  CIWA-Ar Total: 2  COWS:  COWS Total Score: 8   Mental Status Exam: See Mental Status Examination and Suicide Risk Assessment completed by Attending Physician prior to discharge.  Discharge destination:  Other:  Follow-up with [facility], AA, and [facility]  Is patient on multiple antipsychotic therapies at discharge:  No   Has Patient had three or more failed trials of antipsychotic monotherapy by history:  No Recommended Plan for Multiple Antipsychotic Therapies:  N/A  Discharge Orders    Future Orders Please Complete By Expires   Diet - low sodium heart healthy      Activity as tolerated - No restrictions          Medication List     As of 06/05/2012  8:54 AM    TAKE these medications      Indication    levothyroxine 125 MCG tablet   Commonly known as: SYNTHROID, LEVOTHROID   Take 1 tablet (125 mcg total) by mouth daily.    Indication: Underactive Thyroid      sertraline 100 MG tablet   Commonly known as: ZOLOFT   Take 1 tablet (100 mg total) by mouth daily.    Indication: Major Depressive Disorder      thiamine 100 MG tablet   Take 1 tablet (100 mg total) by mouth daily.    Indication: Deficiency in Thiamine or Vitamin B1           Follow-up Information    Follow up with [facility].  On 06/12/2012. (Appointment scheduled at 11:40 am with Dr. Shawnee Knapp)    Contact information:   [address] [place] [address], [place] [address] [phone]        Follow-up recommendations:  Activity as tolerated, low sodium heart healthy diet  Comments:  Patient denied suicidal/homicidal ideations and auditory/visual hallucinations, follow-up appointments encouraged to attend, outside support groups encouraged and information given.  Mary Graves plans  to return for therapy at [facility], attend [employer] and get a new sponsor (90/90 days), and meet with [employer].  SignedNanine Means, NP 06/05/2012, 8:54 AM

## 2012-06-05 NOTE — Tx Team (Addendum)
Interdisciplinary Treatment Plan Update (Adult)  Date:  06/05/2012  Time Reviewed:  9:52 AM   Progress in Treatment: Attending groups: Yes Participating in groups:  Yes Taking medication as prescribed: Yes Tolerating medication:  Yes Family/Significant othe contact made:  Yes Patient understands diagnosis:  Yes Discussing patient identified problems/goals with staff:  Yes Medical problems stabilized or resolved:  Yes Denies suicidal/homicidal ideation: Yes Issues/concerns per patient self-inventory:  None identified Other: N/A  New problem(s) identified: None Identified  Reason for Continuation of Hospitalization: Stable to d/c  Interventions implemented related to continuation of hospitalization: Stable to d/c  Additional comments: N/A  Estimated length of stay: D/C today  Discharge Plan: Pt will follow up with [facility] for medication management and therapy.    New goal(s): N/A  Review of initial/current patient goals per problem list:    1.  Goal(s): Address substance use  Met:  Yes  Target date: by discharge  As evidenced by: completed detox protocol and referred to appropriate treatment  2.  Goal (s): Reduce depressive and anxiety symptoms  Met:  Yes  Target date: by discharge  As evidenced by: Reducing depression from a 10 to a 3 as reported by pt.  Pt rates at a 2-3 today.    Attendees: Patient:   06/05/2012 9:52 AM   Family:     Physician:  Geoffery Lyons, MD 06/05/2012 9:52 AM   Nursing:  06/05/2012 9:52 AM   Clinical Social Worker:  Reyes Ivan, LCSWA 06/05/2012 9:52 AM   Other:  06/05/2012 9:52 AM   Other:     Other:     Other:     Other:      Scribe for Treatment Team:   Reyes Ivan 06/05/2012 9:52 AM

## 2012-06-05 NOTE — Progress Notes (Signed)
D: Pt quiet on unit, looked sleepy.  Mood depressed, affect blunted, speech logical/coherent.  Denies SI/HI, AVH.  CIWA monitoring D/C'ed this evening as librium protocol had ended.  Pt upset at med time because she thought she had two Librium doses left- one tonight, one tomorrow morning- refused other anti-anxiety and sleep PRNs.  Med on-call gave one-time order for Librium 25mg  PO.  Pt to be discharged on 11/5 to [facility].  Contracting for safety on unit.  A: Patient provided with support and encouragement to use coping skills to manage anxiety.  Medication administered according to medication orders.  Maintained on Q15 minute safety checks as per unit protocol.  R: Pt relieved to receive one-time Librium dose; expressed anxiety about D/C.  Safety maintained. Dion Saucier RN

## 2012-06-08 NOTE — Progress Notes (Signed)
Patient Discharge Instructions:  After Visit Summary (AVS):   Faxed to:  06/08/12 Psychiatric Admission Assessment Note:   Faxed to:  06/08/12 Suicide Risk Assessment - Discharge Assessment:   Faxed to:  06/08/12 Faxed/Sent to the Next Level Care provider:  06/08/12 Faxed to [facility] @ [phone]  Jerelene Redden, 06/08/2012, 3:13 PM

## 2012-06-10 NOTE — Discharge Summary (Signed)
Agree with assessment and plan Mary Graves, M.D. 

## 2012-06-12 DIAGNOSIS — F331 Major depressive disorder, recurrent, moderate: Secondary | ICD-10-CM | POA: Diagnosis not present

## 2012-06-27 DIAGNOSIS — F331 Major depressive disorder, recurrent, moderate: Secondary | ICD-10-CM | POA: Diagnosis not present

## 2012-07-27 IMAGING — CT CT MAXILLOFACIAL W/O CM
1 of 2 series · 15 of 30 positions shown, 19 images · non-contrast
Comparison: Head CT is 01/08/2011.

CLINICAL DATA: Fall last night.  Abrasion to left zygoma.
Alcoholism and bulimia.

CT FACE  WITHOUT CONTRAST
TECHNIQUE: Continous axial images were obtained of face without
contrast.  Sagittal and coronal reformats were constructed.
TECHNIQUE: Contiguous axial images were obtained from the base of
the skull through the vertex without contrast

[Series 4: orbit 2.0 h32s · axial · 0.30mm/px · z∈[+870,+1016]mm · 15 of 81 slices shown, 19 images]
[im 4/81  brain]
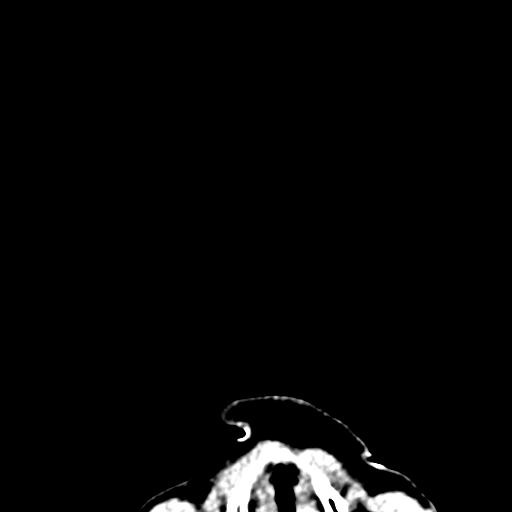
[im 4/81  bone]
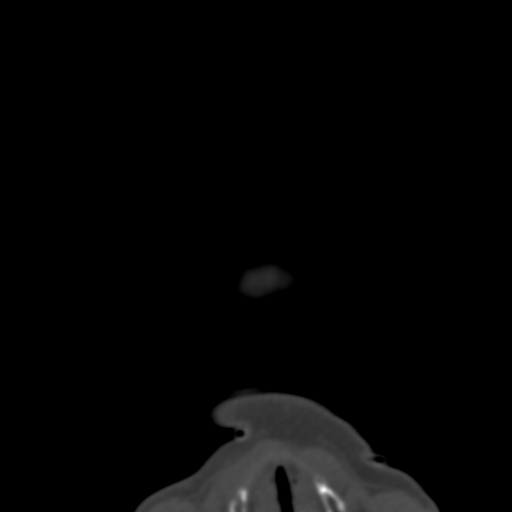
[im 12/81  bone]
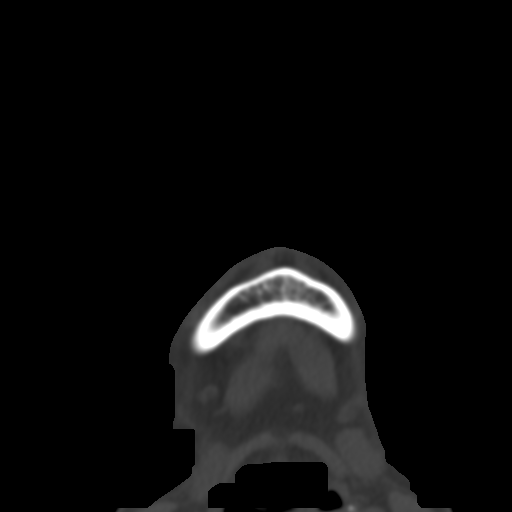
[im 16/81  bone]
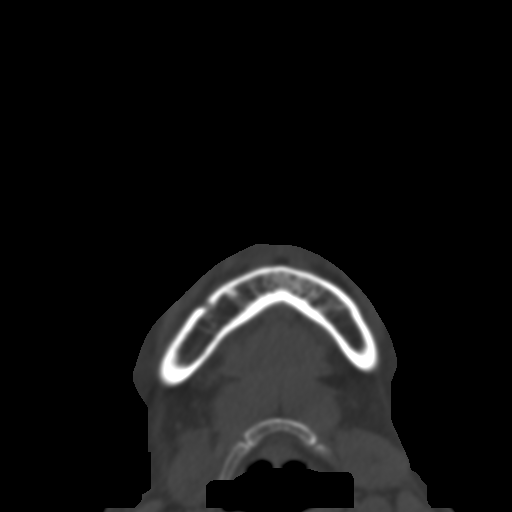
[im 20/81  bone]
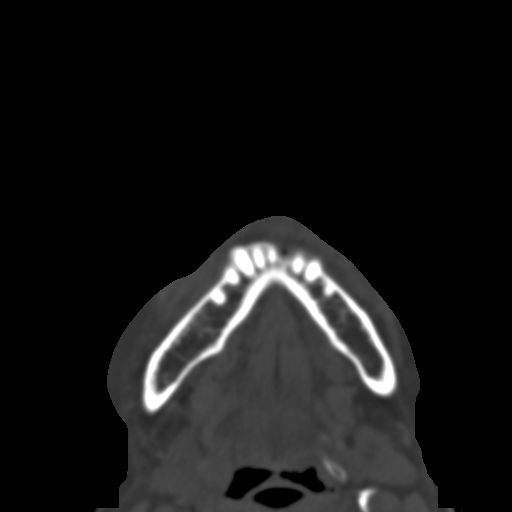
[im 27/81  brain]
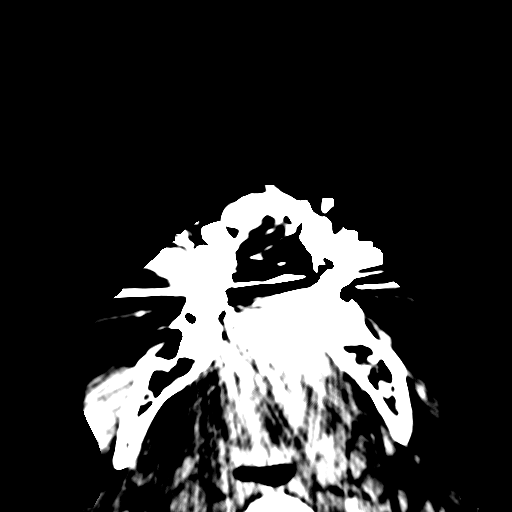
[im 27/81  bone]
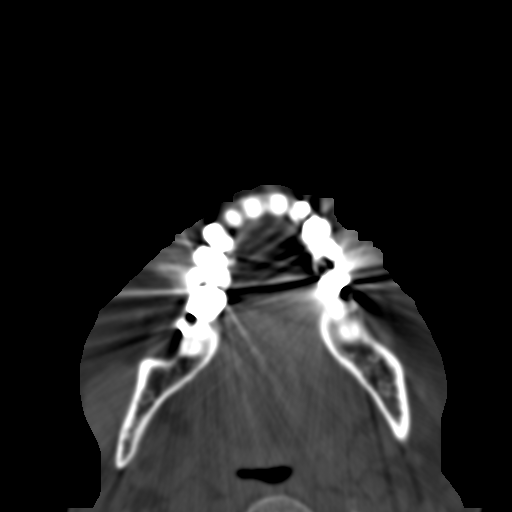
[im 31/81  bone]
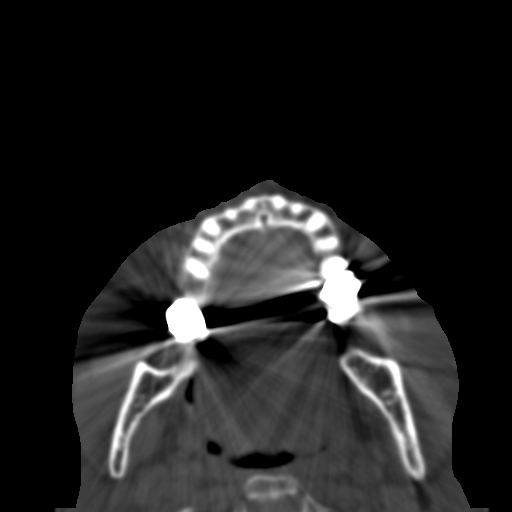
[im 35/81  bone]
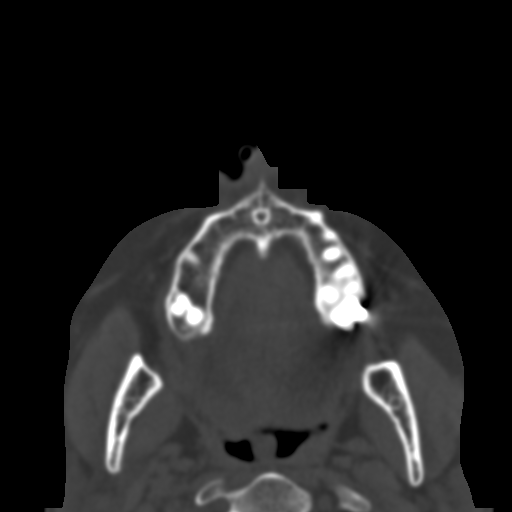
[im 42/81  bone]
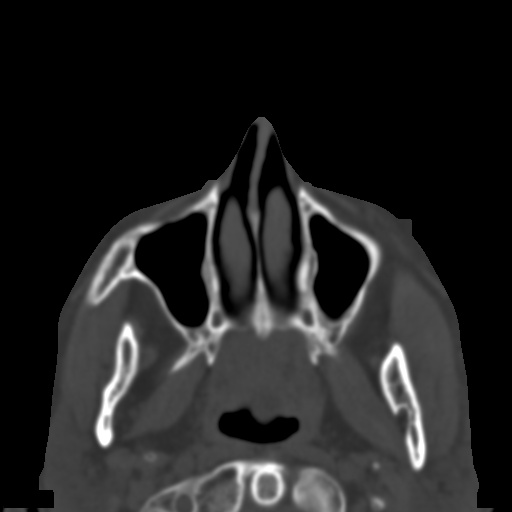
[im 46/81  brain]
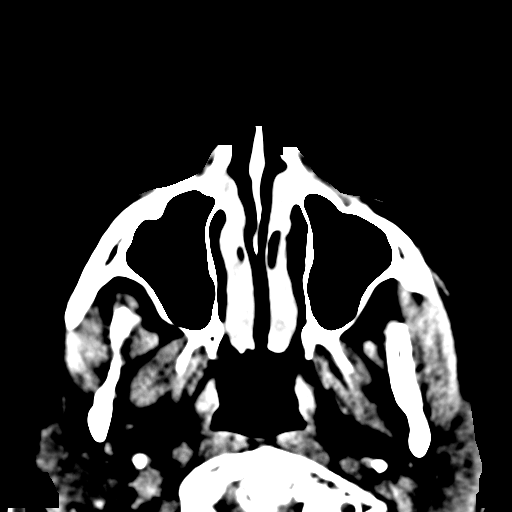
[im 46/81  bone]
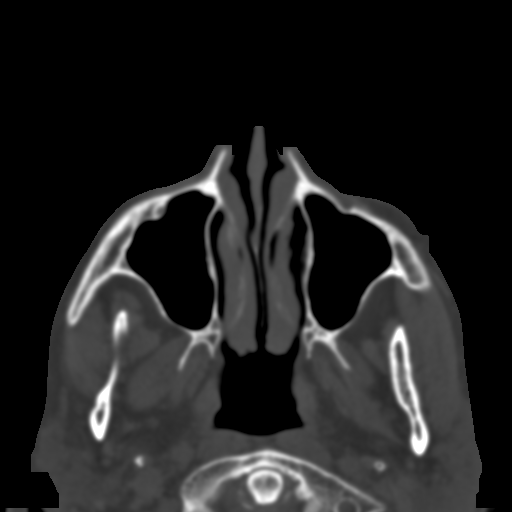
[im 50/81  bone]
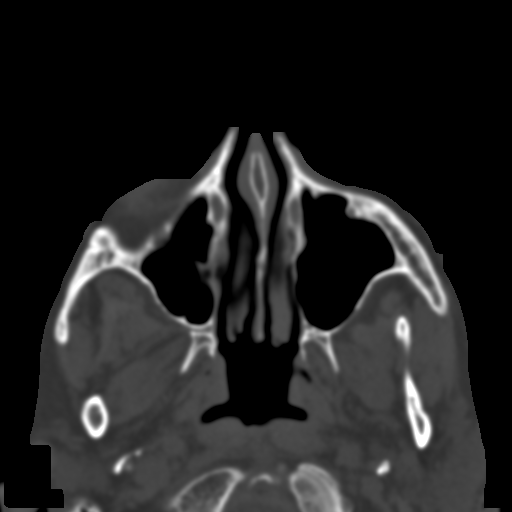
[im 58/81  bone]
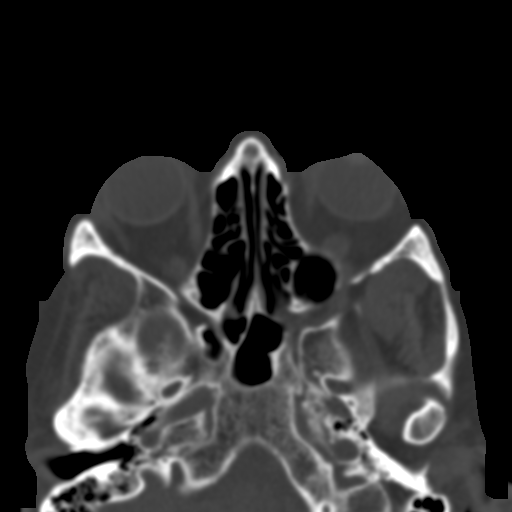
[im 61/81  bone]
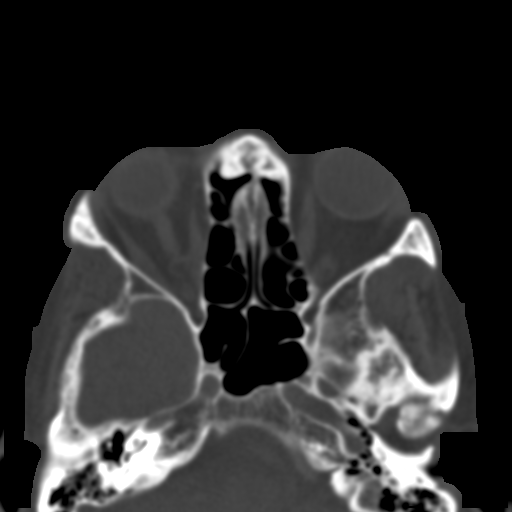
[im 65/81  brain]
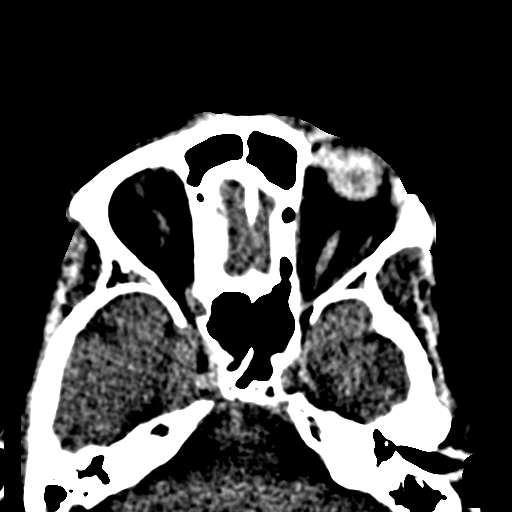
[im 65/81  bone]
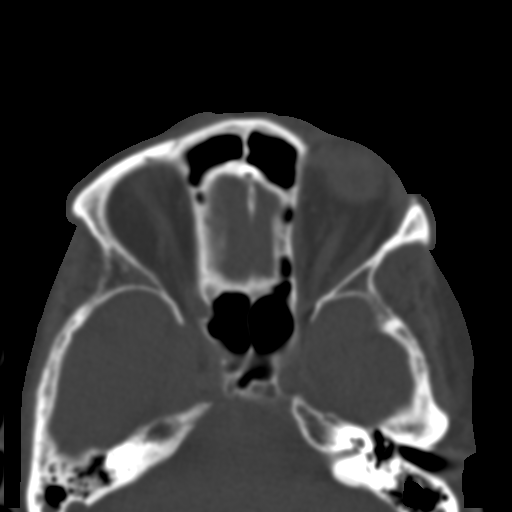
[im 73/81  bone]
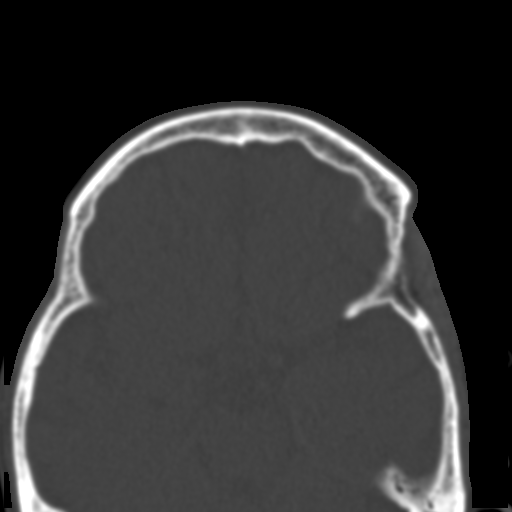
[im 77/81  bone]
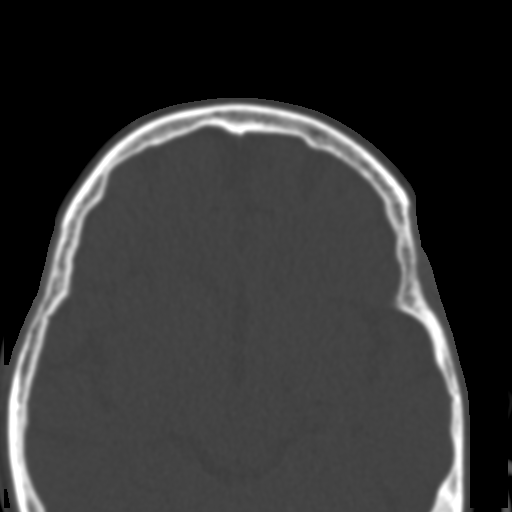

[15 of 30 positions shown; findings below may reference images not displayed]

FINDINGS: Soft tissue windows demonstrate a suspicion of mild soft
tissue swelling over the left lateral maxillary sinus and zygoma.
Normal appearance of the orbits and globes bilaterally.

Bone windows demonstrate no fracture or dislocation.  No fluid in
the paranasal sinuses or imaged mastoid air cells.

Coronal reformats demonstrate intact orbital floors.
IMPRESSION: No acute osseous finding about the face.

CT HEAD  WITHOUT CONTRAST
FINDINGS: Bone windows demonstrate venous lakes within the
occipital calvarium. No skull fracture.

Clear paranasal sinuses and mastoid air cells.

Soft tissue windows demonstrate mildly age advanced cerebral
atrophy. No  mass lesion, hemorrhage, hydrocephalus, acute infarct,
intra-axial, or extra-axial fluid collection.
IMPRESSION: No acute intracranial abnormality.

## 2012-08-11 ENCOUNTER — Encounter (HOSPITAL_COMMUNITY): Payer: Self-pay | Admitting: Emergency Medicine

## 2012-08-11 ENCOUNTER — Emergency Department (HOSPITAL_COMMUNITY)
Admission: EM | Admit: 2012-08-11 | Discharge: 2012-08-13 | Disposition: A | Discharge status: Home / Self Care | Payer: Medicare Other | Attending: Emergency Medicine | Admitting: Emergency Medicine

## 2012-08-11 DIAGNOSIS — I10 Essential (primary) hypertension: Secondary | ICD-10-CM | POA: Insufficient documentation

## 2012-08-11 DIAGNOSIS — Z79899 Other long term (current) drug therapy: Secondary | ICD-10-CM | POA: Insufficient documentation

## 2012-08-11 DIAGNOSIS — F10229 Alcohol dependence with intoxication, unspecified: Secondary | ICD-10-CM | POA: Insufficient documentation

## 2012-08-11 DIAGNOSIS — E039 Hypothyroidism, unspecified: Secondary | ICD-10-CM | POA: Insufficient documentation

## 2012-08-11 DIAGNOSIS — F10929 Alcohol use, unspecified with intoxication, unspecified: Secondary | ICD-10-CM

## 2012-08-11 HISTORY — DX: Alcohol dependence, uncomplicated: F10.20

## 2012-08-11 LAB — COMPREHENSIVE METABOLIC PANEL
AST: 30 U/L (ref 0–37)
BUN: 16 mg/dL (ref 6–23)
CO2: 32 mEq/L (ref 19–32)
Calcium: 8.6 mg/dL (ref 8.4–10.5)
Chloride: 90 mEq/L — ABNORMAL LOW (ref 96–112)
Creatinine, Ser: 0.92 mg/dL (ref 0.50–1.10)
GFR calc non Af Amer: 63 mL/min — ABNORMAL LOW (ref >90)
Total Bilirubin: 0.4 mg/dL (ref 0.3–1.2)

## 2012-08-11 LAB — CBC
HCT: 40.3 % (ref 36.0–46.0)
MCH: 31.5 pg (ref 26.0–34.0)
MCV: 90.2 fL (ref 78.0–100.0)
Platelets: 163 10*3/uL (ref 150–400)
RBC: 4.47 MIL/uL (ref 3.87–5.11)
RDW: 13.5 % (ref 11.5–15.5)
WBC: 7.5 10*3/uL (ref 4.0–10.5)

## 2012-08-11 LAB — HEPATIC FUNCTION PANEL
ALT: 14 U/L (ref 0–35)
AST: 34 U/L (ref 0–37)
Bilirubin, Direct: <0.1 mg/dL (ref 0.0–0.3)
Total Protein: 7.3 g/dL (ref 6.0–8.3)

## 2012-08-11 LAB — ETHANOL: Alcohol, Ethyl (B): 307 mg/dL — ABNORMAL HIGH (ref 0–11)

## 2012-08-11 LAB — RAPID URINE DRUG SCREEN, HOSP PERFORMED
Cocaine: NOT DETECTED
Opiates: NOT DETECTED

## 2012-08-11 MED ORDER — POTASSIUM CHLORIDE CRYS ER 20 MEQ PO TBCR
40.0000 meq | EXTENDED_RELEASE_TABLET | Freq: Once | ORAL | Status: AC
  Administered 2012-08-11: 40 meq via ORAL
  Filled 2012-08-11: qty 2

## 2012-08-11 MED ORDER — ALUM & MAG HYDROXIDE-SIMETH 200-200-20 MG/5ML PO SUSP: 30.0000 mL | ORAL | Status: DC | PRN

## 2012-08-11 MED ORDER — LORAZEPAM 1 MG PO TABS
1.0000 mg | ORAL_TABLET | Freq: Three times a day (TID) | ORAL | Status: DC | PRN
  Administered 2012-08-12 (×2): 1 mg via ORAL
  Filled 2012-08-11 (×2): qty 1

## 2012-08-11 MED ORDER — ACETAMINOPHEN 325 MG PO TABS: 650.0000 mg | ORAL_TABLET | ORAL | Status: DC | PRN

## 2012-08-11 MED ORDER — IBUPROFEN 200 MG PO TABS: 600.0000 mg | ORAL_TABLET | Freq: Three times a day (TID) | ORAL | Status: DC | PRN

## 2012-08-11 MED ORDER — ONDANSETRON HCL 4 MG PO TABS
4.0000 mg | ORAL_TABLET | Freq: Three times a day (TID) | ORAL | Status: DC | PRN
  Administered 2012-08-11 – 2012-08-12 (×2): 4 mg via ORAL
  Filled 2012-08-11 (×2): qty 1

## 2012-08-11 MED ORDER — ZOLPIDEM TARTRATE 5 MG PO TABS
5.0000 mg | ORAL_TABLET | Freq: Every evening | ORAL | Status: DC | PRN
  Administered 2012-08-11 – 2012-08-12 (×2): 5 mg via ORAL
  Filled 2012-08-11: qty 1

## 2012-08-11 MED ORDER — ONDANSETRON 4 MG PO TBDP
4.0000 mg | ORAL_TABLET | Freq: Once | ORAL | Status: AC
  Administered 2012-08-11: 4 mg via ORAL
  Filled 2012-08-11: qty 1

## 2012-08-11 NOTE — ED Notes (Signed)
Pt originally stated that she was here for high blood pressure.  When pt was told that her BP was fine, she said "can I tell you the truth?  I haven't been able to put down alcohol for about 6 weeks.  I'm an alcoholic.  I have been throwing up and the only way I can stop is if I drink more".  Pt appears intoxicated.  Drool or some other type of liquid is on the collar of her shirt.  States she drove herself here.

## 2012-08-11 NOTE — ED Notes (Addendum)
After pt was asked if she felt like hurting herself or anyone else and pt responded no, pt stated "actually, I would love to kill myself.  I don't have a plan but I guess I would do it with alcohol."  Pt is drooling on herself.  Pt was asked how she got here and stated that she drove herself.  This writer told her that she should not ever drive if she is under the influence of alcohol.  Pt stated that a cab costs too much.  This writer told her that if she was ever in that situation again, she should call 911 to come to the hospital instead of driving.  Pt stated "you can't tell me what to do.  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

## 2012-08-11 NOTE — ED Provider Notes (Signed)
History     CSN: [id number]  Arrival date & time 08/11/12  1308   First MD Initiated Contact with Patient 08/11/12 1413      Chief Complaint  Patient presents with  . Medical Clearance    (Consider location/radiation/quality/duration/timing/severity/associated sxs/prior treatment) HPI  ALEVEL 5 CAVEAT PERTAINS DUE TO ALCOHOL INTOXICATION/ALTERED MENTAL STATUS Pt presenting with alcohol intoxication.  Pt also c/o nausea and vomiting and epigastric abdominal pain. She states she has been binge drinking for the past 6 weeks.  She states that this usually causes her to have nausea/vomiting and the only solution is to keep drinking.  When asked about thoughts of suicide she denied any active suicidal thoughts, but had told nurse that she wanted to drink alcohol to kill herself.      Past Medical History  Diagnosis Date  . Hypertension   . Hypothyroidism   . Alcoholism     Past Surgical History  Procedure Date  . Tonsillectomy     Family History  Problem Relation Age of Onset  . Cancer Other     Breast cancer  . Alcohol abuse Neg Hx   . Heart disease Neg Hx   . Hyperlipidemia Neg Hx   . Hypertension Neg Hx   . Stroke Neg Hx     History  Substance Use Topics  . Smoking status: Never Smoker   . Smokeless tobacco: Never Used  . Alcohol Use: 5.0 oz/week    10 drink(s) per week    OB History    Grav Para Term Preterm Abortions TAB SAB Ect Mult Living                  Review of Systems ROS reviewed and all otherwise negative except for mentioned in HPI  Allergies  Ace inhibitors  Home Medications   Current Outpatient Rx  Name  Route  Sig  Dispense  Refill  . LEVOTHYROXINE SODIUM 125 MCG PO TABS   Oral   Take 1 tablet (125 mcg total) by mouth daily.   90 tablet   1   . SERTRALINE HCL 100 MG PO TABS   Oral   Take 1 tablet (100 mg total) by mouth daily.   30 tablet   0     BP 116/71  Pulse 88  Temp 98.4 F (36.9 C) (Oral)  Resp 18  SpO2  100% Vitals reviewed Physical Exam Physical Examination: General appearance - alert, well appearing, and in no distress Mental status - alert, oriented to person, place, and time Eyes - no scleral icterus, no conjunctival injection Mouth - mucous membranes moist, pharynx normal without lesions Chest - clear to auscultation, no wheezes, rales or rhonchi, symmetric air entry Heart - normal rate, regular rhythm, normal S1, S2, no murmurs, rubs, clicks or gallops Abdomen - soft, nontender, nondistended, no masses or organomegaly, nabs Extremities - peripheral pulses normal, no pedal edema, no clubbing or cyanosis Skin - normal coloration and turgor, no rashes Psych- intoxicated, cooperative and calm  ED Course  Procedures (including critical care time)  3:30 PM d/w ACT, [place].  They will evaluate patient once she is more sober.  Psych holding orders written.     Labs Reviewed  CBC  COMPREHENSIVE METABOLIC PANEL  ETHANOL  URINE RAPID DRUG SCREEN (HOSP PERFORMED)  LIPASE, BLOOD  HEPATIC FUNCTION PANEL   No results found.   No diagnosis found.    MDM  Pt presenting with alcohol intoxication with nausea/vomiting.  She is concerned about  a flare up of her pancreatitis- lipase is normal.  ETOH is elevated.  Pt will need to sober, then be reassessed in terms of her suicidality- not actively suicidal at this time as well as her desire for detox.  D/w ACT and they will evaluate patient once more sober.  Psych holding orders written        Ethelda Chick, MD 08/11/12 [phone]

## 2012-08-12 MED ORDER — LOPERAMIDE HCL 2 MG PO CAPS
4.0000 mg | ORAL_CAPSULE | Freq: Once | ORAL | Status: AC
  Administered 2012-08-12: 4 mg via ORAL
  Filled 2012-08-12: qty 2

## 2012-08-12 MED ORDER — AMLODIPINE BESYLATE 10 MG PO TABS
10.0000 mg | ORAL_TABLET | Freq: Every day | ORAL | Status: DC
  Administered 2012-08-12 – 2012-08-13 (×2): 10 mg via ORAL
  Filled 2012-08-12 (×2): qty 1

## 2012-08-12 MED ORDER — LOPERAMIDE HCL 2 MG PO CAPS: 2.0000 mg | ORAL_CAPSULE | ORAL | Status: DC | PRN

## 2012-08-12 NOTE — ED Notes (Addendum)
Dr. Terressa Koyanagi notified of pt's latest BP, to give PRN ativan 1mg  /PO.

## 2012-08-12 NOTE — ED Notes (Addendum)
Pt calm, cooperative, denies SI, HI; pt c/o HA due to elevated  Blood pressure; norvasc ordered.  Sitter at bedside
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
## 2012-08-12 NOTE — Progress Notes (Signed)
Pt does not want to go to [facility]  Contacted [facility] and Everlean Alstrom reported no beds at this time.  ACT welcome to check back after shift change at 2000 or in the AM.

## 2012-08-12 NOTE — ED Notes (Signed)
Patient is resting comfortably. Patient not disturbed at this time for Vital signs. The patient has been complaining of lack of sleep. Patient allowed to rest

## 2012-08-12 NOTE — ED Provider Notes (Signed)
Nurse called, patient is having diarrhea, will order imodium.   Ward Givens, MD 08/12/12 1444

## 2012-08-12 NOTE — ED Notes (Signed)
Pt requesting door to be closed at this time. She doesn't have SI and was instructed to use her call bell before attempting to get oob. Sitters working with other pts informed me that over the weekend she was being observed for safety reasons. I don't find the pt to be at risk for falling at this time and there is no active order for a Recruitment consultant. Therefore I am honoring her request and closing her door. Hourly rounding will still be continued. Campos[place]Garcia, [employer]

## 2012-08-12 NOTE — ED Provider Notes (Signed)
Patient here for detox. She states she has been binging for the past 3-6 weeks. She is drinking 64 ounces a day of alcohol. She denies suicidal or homicidal ideation. She states her last detox was in October and she was sober until about Christmas time. Nursing staff also notes her blood pressure has been rising and does not improve with Ativan for her withdrawal symptoms. Patient is obsessing about her blood pressure.  Patient does not want to go to behavior health, other options are being pursued by the act team.  Filed Vitals:   08/12/12 0729  BP: 172/83  Pulse: 78  Temp: 98.5 F (36.9 C)  Resp: 16   Plan we'll start on blood pressure medication.  Devoria Albe, MD, Armando Gang    Ward Givens, MD 08/12/12 [phone]

## 2012-08-13 NOTE — BHH Counselor (Signed)
Per Dennie Bible at [facility] - pt not eligible for detox as she has Medicare. [facility] only accepts Medicare for the treatment portion of their program, not for the detox process.

## 2012-08-13 NOTE — BHH Counselor (Signed)
Pt referred to [facility]. Received a call from Christiane Ha stating that they were considering patient if she was still interested in treatment. Writer met with patient to discuss her potential acceptance to [facility]. Patient stated, "I would really rather go home". Patient declined treatment and requested out patient referrals.  Patient denies SI, HI, and AVH's. She was able to verbally contract for safety.   Discussed with EDP (Dr. Thereasa Solo) the about information and he agreed to discharge patient home.Patients nurse was made aware of patients discharge home. Patient given substance abuse referrals and she agreed to follow up accordingly.

## 2012-08-13 NOTE — ED Provider Notes (Signed)
No new issues or problems overnight.  Attempting to arrange etoh treatment.  The patient would like to go home today.  She denies to me she is suicidal.    Geoffery Lyons, MD 08/13/12 [phone]

## 2012-08-13 NOTE — ED Notes (Signed)
Pt requesting to leave AMA per [facility] ACT team, MD notified.

## 2012-12-18 DIAGNOSIS — M542 Cervicalgia: Secondary | ICD-10-CM | POA: Diagnosis not present

## 2012-12-18 DIAGNOSIS — M5030 Other cervical disc degeneration, unspecified cervical region: Secondary | ICD-10-CM | POA: Diagnosis not present

## 2012-12-20 DIAGNOSIS — M542 Cervicalgia: Secondary | ICD-10-CM | POA: Diagnosis not present

## 2012-12-25 DIAGNOSIS — M542 Cervicalgia: Secondary | ICD-10-CM | POA: Diagnosis not present

## 2012-12-28 DIAGNOSIS — M542 Cervicalgia: Secondary | ICD-10-CM | POA: Diagnosis not present

## 2013-01-01 DIAGNOSIS — M542 Cervicalgia: Secondary | ICD-10-CM | POA: Diagnosis not present

## 2013-01-03 DIAGNOSIS — M542 Cervicalgia: Secondary | ICD-10-CM | POA: Diagnosis not present

## 2013-01-18 ENCOUNTER — Other Ambulatory Visit: Payer: Self-pay

## 2013-01-18 DIAGNOSIS — Z1231 Encounter for screening mammogram for malignant neoplasm of breast: Secondary | ICD-10-CM

## 2013-02-20 ENCOUNTER — Ambulatory Visit: Payer: Self-pay

## 2013-02-27 ENCOUNTER — Ambulatory Visit (INDEPENDENT_AMBULATORY_CARE_PROVIDER_SITE_OTHER): Payer: Medicare Other | Admitting: Internal Medicine

## 2013-02-27 ENCOUNTER — Encounter: Payer: Self-pay | Admitting: Internal Medicine

## 2013-02-27 ENCOUNTER — Other Ambulatory Visit (INDEPENDENT_AMBULATORY_CARE_PROVIDER_SITE_OTHER): Payer: Medicare Other

## 2013-02-27 VITALS — BP 120/68 | HR 70 | Temp 97.7°F | Resp 16 | Ht 67.0 in | Wt 135.0 lb

## 2013-02-27 DIAGNOSIS — I10 Essential (primary) hypertension: Secondary | ICD-10-CM

## 2013-02-27 DIAGNOSIS — M8580 Other specified disorders of bone density and structure, unspecified site: Secondary | ICD-10-CM | POA: Insufficient documentation

## 2013-02-27 DIAGNOSIS — N181 Chronic kidney disease, stage 1: Secondary | ICD-10-CM | POA: Diagnosis not present

## 2013-02-27 DIAGNOSIS — E785 Hyperlipidemia, unspecified: Secondary | ICD-10-CM | POA: Diagnosis not present

## 2013-02-27 DIAGNOSIS — R7309 Other abnormal glucose: Secondary | ICD-10-CM | POA: Diagnosis not present

## 2013-02-27 DIAGNOSIS — E039 Hypothyroidism, unspecified: Secondary | ICD-10-CM | POA: Diagnosis not present

## 2013-02-27 DIAGNOSIS — H40009 Preglaucoma, unspecified, unspecified eye: Secondary | ICD-10-CM

## 2013-02-27 DIAGNOSIS — Z0000 Encounter for general adult medical examination without abnormal findings: Secondary | ICD-10-CM

## 2013-02-27 DIAGNOSIS — M899 Disorder of bone, unspecified: Secondary | ICD-10-CM

## 2013-02-27 LAB — CBC WITH DIFFERENTIAL/PLATELET
Basophils Absolute: 0.1 10*3/uL (ref 0.0–0.1)
Eosinophils Absolute: 0.2 10*3/uL (ref 0.0–0.7)
HCT: 36.4 % (ref 36.0–46.0)
Hemoglobin: 12.3 g/dL (ref 12.0–15.0)
Lymphs Abs: 1.3 10*3/uL (ref 0.7–4.0)
MCHC: 33.7 g/dL (ref 30.0–36.0)
Neutro Abs: 3.8 10*3/uL (ref 1.4–7.7)
RDW: 14.9 % — ABNORMAL HIGH (ref 11.5–14.6)

## 2013-02-27 LAB — LIPID PANEL
HDL: 71.5 mg/dL (ref >39.00)
Total CHOL/HDL Ratio: 3
Triglycerides: 70 mg/dL (ref 0.0–149.0)

## 2013-02-27 LAB — COMPREHENSIVE METABOLIC PANEL
ALT: 13 U/L (ref 0–35)
AST: 19 U/L (ref 0–37)
Alkaline Phosphatase: 41 U/L (ref 39–117)
Creatinine, Ser: 1.1 mg/dL (ref 0.4–1.2)
Total Bilirubin: 0.5 mg/dL (ref 0.3–1.2)

## 2013-02-27 LAB — HEMOGLOBIN A1C: Hgb A1c MFr Bld: 5.5 % (ref 4.6–6.5)

## 2013-02-27 MED ORDER — TIROSINT 150 MCG PO CAPS: 1.0000 | ORAL_CAPSULE | Freq: Every day | ORAL | Status: DC

## 2013-02-27 NOTE — Assessment & Plan Note (Signed)
I tried to order a Vit D level but I was blocked by the software She is due for a DEXA scan

## 2013-02-27 NOTE — Assessment & Plan Note (Signed)
Her TSH is up so I have raised her dose and have asked her to change to tirosint

## 2013-02-27 NOTE — Patient Instructions (Signed)

## 2013-02-27 NOTE — Progress Notes (Signed)
  Subjective:    Patient ID: Mary Graves, female    DOB: Dec 20, 1945, 67 y.o.   MRN: [id number]  Thyroid Problem Presents for follow-up visit. Symptoms include fatigue and weight gain. Patient reports no anxiety, cold intolerance, constipation, depressed mood, diaphoresis, diarrhea, dry skin, hair loss, heat intolerance, hoarse voice, leg swelling, nail problem, palpitations, tremors, visual change or weight loss. The symptoms have been stable.      Review of Systems  Constitutional: Positive for weight gain and fatigue. Negative for fever, chills, weight loss, diaphoresis, activity change and appetite change.  HENT: Negative.  Negative for hoarse voice.   Eyes: Negative.   Respiratory: Negative.  Negative for cough, chest tightness, shortness of breath, wheezing and stridor.   Cardiovascular: Negative.  Negative for chest pain, palpitations and leg swelling.  Gastrointestinal: Negative.  Negative for nausea, vomiting, abdominal pain, diarrhea and constipation.  Endocrine: Negative.  Negative for cold intolerance, heat intolerance, polydipsia, polyphagia and polyuria.  Genitourinary: Negative.   Musculoskeletal: Negative.   Skin: Negative.   Allergic/Immunologic: Negative.   Neurological: Negative.  Negative for dizziness, tremors, seizures, light-headedness and numbness.  Hematological: Negative.  Negative for adenopathy. Does not bruise/bleed easily.  Psychiatric/Behavioral: Negative.  Negative for suicidal ideas, sleep disturbance and dysphoric mood. The patient is not hyperactive.        Objective:   Physical Exam  Vitals reviewed. Constitutional: She is oriented to person, place, and time. She appears well-developed and well-nourished. No distress.  HENT:  Head: Normocephalic and atraumatic.  Mouth/Throat: Oropharynx is clear and moist. No oropharyngeal exudate.  Eyes: Conjunctivae are normal. Right eye exhibits no discharge. Left eye exhibits no discharge. No scleral icterus.   Neck: Normal range of motion. Neck supple. No JVD present. No tracheal deviation present. No thyromegaly present.  Cardiovascular: Normal rate, regular rhythm, normal heart sounds and intact distal pulses.  Exam reveals no gallop and no friction rub.   No murmur heard. Pulmonary/Chest: Effort normal and breath sounds normal. No stridor. No respiratory distress. She has no wheezes. She has no rales. She exhibits no tenderness.  Abdominal: Soft. Bowel sounds are normal. She exhibits no distension and no mass. There is no tenderness. There is no rebound and no guarding.  Musculoskeletal: Normal range of motion. She exhibits no edema and no tenderness.  Lymphadenopathy:    She has no cervical adenopathy.  Neurological: She is oriented to person, place, and time.  Skin: Skin is warm and dry. No rash noted. She is not diaphoretic. No erythema. No pallor.  Psychiatric: She has a normal mood and affect. Her behavior is normal. Judgment and thought content normal.     Lab Results  Component Value Date   WBC 7.5 08/11/2012   HGB 14.1 08/11/2012   HCT 40.3 08/11/2012   PLT 163 08/11/2012   GLUCOSE 135* 08/11/2012   CHOL 180 10/17/2011   TRIG 95.0 10/17/2011   HDL 66.70 10/17/2011   LDLDIRECT 142.0 10/19/2009   LDLCALC 94 10/17/2011   ALT 14 08/11/2012   AST 34 08/11/2012   NA 134* 08/11/2012   K 3.1* 08/11/2012   CL 90* 08/11/2012   CREATININE 0.92 08/11/2012   BUN 16 08/11/2012   CO2 32 08/11/2012   TSH 44.985* 06/02/2012   HGBA1C 5.5 10/17/2011       Assessment & Plan:

## 2013-02-28 ENCOUNTER — Ambulatory Visit
Admission: RE | Admit: 2013-02-28 | Discharge: 2013-02-28 | Disposition: A | Discharge status: Home / Self Care | Payer: Medicare Other | Source: Ambulatory Visit

## 2013-02-28 ENCOUNTER — Encounter: Payer: Self-pay | Admitting: Internal Medicine

## 2013-02-28 DIAGNOSIS — Z1231 Encounter for screening mammogram for malignant neoplasm of breast: Secondary | ICD-10-CM

## 2013-02-28 NOTE — Assessment & Plan Note (Signed)
I will check her A1C to see if she has developed DM2 

## 2013-02-28 NOTE — Assessment & Plan Note (Signed)
This is stable 

## 2013-02-28 NOTE — Assessment & Plan Note (Signed)
Her BP is well controlled 

## 2013-02-28 NOTE — Assessment & Plan Note (Signed)
She needs a f/up with Ophth

## 2013-02-28 NOTE — Assessment & Plan Note (Addendum)

## 2013-03-04 ENCOUNTER — Telehealth: Payer: Self-pay

## 2013-03-04 DIAGNOSIS — F331 Major depressive disorder, recurrent, moderate: Secondary | ICD-10-CM | POA: Diagnosis not present

## 2013-03-04 DIAGNOSIS — E039 Hypothyroidism, unspecified: Secondary | ICD-10-CM

## 2013-03-04 MED ORDER — TIROSINT 150 MCG PO CAPS: 1.0000 | ORAL_CAPSULE | Freq: Every day | ORAL | Status: DC

## 2013-03-04 MED ORDER — LEVOTHYROXINE SODIUM 175 MCG PO TABS: 175.0000 ug | ORAL_TABLET | Freq: Every day | ORAL | Status: DC

## 2013-03-04 NOTE — Telephone Encounter (Signed)
Higher dose is needed

## 2013-03-04 NOTE — Telephone Encounter (Signed)
Patient called [facility] requesting that thyroid meds are sent to [facility] instead for [facility] due to cost of $238.00. She would like to know if she should double up on the current that she has left until able to get her new medication. Thanks

## 2013-03-05 ENCOUNTER — Ambulatory Visit (INDEPENDENT_AMBULATORY_CARE_PROVIDER_SITE_OTHER)
Admission: RE | Admit: 2013-03-05 | Discharge: 2013-03-05 | Disposition: A | Discharge status: Home / Self Care | Payer: Medicare Other | Source: Ambulatory Visit | Attending: Internal Medicine | Admitting: Internal Medicine

## 2013-03-05 DIAGNOSIS — M949 Disorder of cartilage, unspecified: Secondary | ICD-10-CM | POA: Diagnosis not present

## 2013-03-05 DIAGNOSIS — M899 Disorder of bone, unspecified: Secondary | ICD-10-CM | POA: Diagnosis not present

## 2013-03-05 DIAGNOSIS — M8580 Other specified disorders of bone density and structure, unspecified site: Secondary | ICD-10-CM

## 2013-03-05 NOTE — Telephone Encounter (Signed)
Patient notified//[facility]  

## 2013-03-05 NOTE — Telephone Encounter (Signed)
Pt came into office today, has not heard anything back from phone call, wants to make sure meds are sent to [facility] on [address]. Request call back.

## 2013-03-06 ENCOUNTER — Other Ambulatory Visit: Payer: Self-pay

## 2013-04-03 ENCOUNTER — Encounter: Payer: Self-pay | Admitting: Internal Medicine

## 2013-04-03 LAB — HM DEXA SCAN: HM Dexa Scan: -2.6

## 2013-04-05 DIAGNOSIS — E0500 Thyrotoxicosis with diffuse goiter without thyrotoxic crisis or storm: Secondary | ICD-10-CM | POA: Diagnosis not present

## 2013-04-05 DIAGNOSIS — H524 Presbyopia: Secondary | ICD-10-CM | POA: Diagnosis not present

## 2013-04-05 DIAGNOSIS — H52209 Unspecified astigmatism, unspecified eye: Secondary | ICD-10-CM | POA: Diagnosis not present

## 2013-04-16 ENCOUNTER — Telehealth: Payer: Self-pay | Admitting: Internal Medicine

## 2013-04-16 NOTE — Telephone Encounter (Signed)
The patient would like to receive the prolelia (spelling.Marland KitchenMarland Kitchen?) injection.  Is this okay with her insurance?

## 2013-04-26 ENCOUNTER — Telehealth: Payer: Self-pay | Admitting: Internal Medicine

## 2013-04-26 NOTE — Telephone Encounter (Signed)
Pt looked on her My Chart and saw that it was listed that she has chronic kidney disease.  She says she knows nothing about this and wants someone to call to explain why that is on her record.

## 2013-04-29 DIAGNOSIS — F331 Major depressive disorder, recurrent, moderate: Secondary | ICD-10-CM | POA: Diagnosis not present

## 2013-04-29 NOTE — Telephone Encounter (Signed)
She just has mild kidney dysfunction, there is nothing else that needs to be said or done

## 2013-05-02 ENCOUNTER — Ambulatory Visit (INDEPENDENT_AMBULATORY_CARE_PROVIDER_SITE_OTHER): Payer: Medicare Other

## 2013-05-02 DIAGNOSIS — M899 Disorder of bone, unspecified: Secondary | ICD-10-CM

## 2013-05-02 DIAGNOSIS — M8580 Other specified disorders of bone density and structure, unspecified site: Secondary | ICD-10-CM

## 2013-05-02 MED ORDER — DENOSUMAB 60 MG/ML ~~LOC~~ SOLN
60.0000 mg | Freq: Once | SUBCUTANEOUS | Status: AC
Start: 2013-05-02 — End: 2013-05-02
  Administered 2013-05-02: 60 mg via SUBCUTANEOUS

## 2013-05-03 NOTE — Telephone Encounter (Signed)
Pt seen 05/02/13 for visit, closing phone note

## 2013-05-13 DIAGNOSIS — Z23 Encounter for immunization: Secondary | ICD-10-CM | POA: Diagnosis not present

## 2013-06-06 ENCOUNTER — Other Ambulatory Visit: Payer: Self-pay

## 2013-07-12 DIAGNOSIS — M25449 Effusion, unspecified hand: Secondary | ICD-10-CM | POA: Diagnosis not present

## 2013-07-15 DIAGNOSIS — M542 Cervicalgia: Secondary | ICD-10-CM | POA: Diagnosis not present

## 2013-07-23 DIAGNOSIS — M542 Cervicalgia: Secondary | ICD-10-CM | POA: Diagnosis not present

## 2013-08-06 DIAGNOSIS — M542 Cervicalgia: Secondary | ICD-10-CM | POA: Diagnosis not present

## 2013-10-03 ENCOUNTER — Emergency Department (HOSPITAL_COMMUNITY): Payer: Medicare Other

## 2013-10-03 ENCOUNTER — Inpatient Hospital Stay (HOSPITAL_COMMUNITY)
Admission: EM | Admit: 2013-10-03 | Discharge: 2013-10-04 | DRG: 641 | Disposition: A | Discharge status: Home / Self Care | Payer: Medicare Other | Attending: Family Medicine | Admitting: Family Medicine

## 2013-10-03 ENCOUNTER — Encounter (HOSPITAL_COMMUNITY): Payer: Self-pay | Admitting: Emergency Medicine

## 2013-10-03 DIAGNOSIS — N181 Chronic kidney disease, stage 1: Secondary | ICD-10-CM

## 2013-10-03 DIAGNOSIS — Z803 Family history of malignant neoplasm of breast: Secondary | ICD-10-CM

## 2013-10-03 DIAGNOSIS — E872 Acidosis, unspecified: Secondary | ICD-10-CM | POA: Diagnosis not present

## 2013-10-03 DIAGNOSIS — N281 Cyst of kidney, acquired: Secondary | ICD-10-CM | POA: Diagnosis not present

## 2013-10-03 DIAGNOSIS — R7309 Other abnormal glucose: Secondary | ICD-10-CM

## 2013-10-03 DIAGNOSIS — I10 Essential (primary) hypertension: Secondary | ICD-10-CM | POA: Diagnosis not present

## 2013-10-03 DIAGNOSIS — H40009 Preglaucoma, unspecified, unspecified eye: Secondary | ICD-10-CM

## 2013-10-03 DIAGNOSIS — E039 Hypothyroidism, unspecified: Secondary | ICD-10-CM | POA: Diagnosis present

## 2013-10-03 DIAGNOSIS — R109 Unspecified abdominal pain: Secondary | ICD-10-CM

## 2013-10-03 DIAGNOSIS — Z79899 Other long term (current) drug therapy: Secondary | ICD-10-CM

## 2013-10-03 DIAGNOSIS — Z0000 Encounter for general adult medical examination without abnormal findings: Secondary | ICD-10-CM

## 2013-10-03 DIAGNOSIS — F329 Major depressive disorder, single episode, unspecified: Secondary | ICD-10-CM

## 2013-10-03 DIAGNOSIS — F1020 Alcohol dependence, uncomplicated: Secondary | ICD-10-CM | POA: Diagnosis present

## 2013-10-03 DIAGNOSIS — R1084 Generalized abdominal pain: Secondary | ICD-10-CM | POA: Diagnosis not present

## 2013-10-03 DIAGNOSIS — E785 Hyperlipidemia, unspecified: Secondary | ICD-10-CM

## 2013-10-03 DIAGNOSIS — M8580 Other specified disorders of bone density and structure, unspecified site: Secondary | ICD-10-CM

## 2013-10-03 DIAGNOSIS — E8729 Other acidosis: Secondary | ICD-10-CM | POA: Diagnosis present

## 2013-10-03 HISTORY — DX: Acute pancreatitis without necrosis or infection, unspecified: K85.90

## 2013-10-03 LAB — URINE MICROSCOPIC-ADD ON

## 2013-10-03 LAB — CBC WITH DIFFERENTIAL/PLATELET
BASOS PCT: 1 % (ref 0–1)
Basophils Absolute: 0 10*3/uL (ref 0.0–0.1)
EOS ABS: 0 10*3/uL (ref 0.0–0.7)
Eosinophils Relative: 0 % (ref 0–5)
HCT: 40.3 % (ref 36.0–46.0)
HEMOGLOBIN: 14.2 g/dL (ref 12.0–15.0)
Lymphocytes Relative: 30 % (ref 12–46)
Lymphs Abs: 1.9 10*3/uL (ref 0.7–4.0)
MCH: 31.3 pg (ref 26.0–34.0)
MCHC: 35.2 g/dL (ref 30.0–36.0)
MCV: 88.8 fL (ref 78.0–100.0)
MONO ABS: 0.3 10*3/uL (ref 0.1–1.0)
MONOS PCT: 5 % (ref 3–12)
NEUTROS PCT: 64 % (ref 43–77)
Neutro Abs: 4 10*3/uL (ref 1.7–7.7)
Platelets: 152 10*3/uL (ref 150–400)
RBC: 4.54 MIL/uL (ref 3.87–5.11)
RDW: 13.7 % (ref 11.5–15.5)
WBC: 6.3 10*3/uL (ref 4.0–10.5)

## 2013-10-03 LAB — MAGNESIUM: Magnesium: 1.8 mg/dL (ref 1.5–2.5)

## 2013-10-03 LAB — BLOOD GAS, VENOUS
ACID-BASE DEFICIT: 8.6 mmol/L — AB (ref 0.0–2.0)
Bicarbonate: 15.9 mEq/L — ABNORMAL LOW (ref 20.0–24.0)
FIO2: 0.21 %
O2 Saturation: 87.6 %
PCO2 VEN: 31.1 mmHg — AB (ref 45.0–50.0)
PO2 VEN: 58.8 mmHg — AB (ref 30.0–45.0)
Patient temperature: 98.6
TCO2: 14.5 mmol/L (ref 0–100)
pH, Ven: 7.329 — ABNORMAL HIGH (ref 7.250–7.300)

## 2013-10-03 LAB — PHOSPHORUS: Phosphorus: 1.9 mg/dL — ABNORMAL LOW (ref 2.3–4.6)

## 2013-10-03 LAB — COMPREHENSIVE METABOLIC PANEL
ALBUMIN: 4.1 g/dL (ref 3.5–5.2)
ALT: 19 U/L (ref 0–35)
AST: 44 U/L — ABNORMAL HIGH (ref 0–37)
Alkaline Phosphatase: 57 U/L (ref 39–117)
BUN: 19 mg/dL (ref 6–23)
CO2: 13 mEq/L — ABNORMAL LOW (ref 19–32)
CREATININE: 0.76 mg/dL (ref 0.50–1.10)
Calcium: 8.4 mg/dL (ref 8.4–10.5)
Chloride: 94 mEq/L — ABNORMAL LOW (ref 96–112)
GFR calc Af Amer: >90 mL/min (ref >90)
GFR calc non Af Amer: 85 mL/min — ABNORMAL LOW (ref >90)
Glucose, Bld: 97 mg/dL (ref 70–99)
POTASSIUM: 4.4 meq/L (ref 3.7–5.3)
Sodium: 135 mEq/L — ABNORMAL LOW (ref 137–147)
TOTAL PROTEIN: 7.5 g/dL (ref 6.0–8.3)
Total Bilirubin: 0.3 mg/dL (ref 0.3–1.2)

## 2013-10-03 LAB — ETHANOL: ALCOHOL ETHYL (B): 83 mg/dL — AB (ref 0–11)

## 2013-10-03 LAB — URINALYSIS, ROUTINE W REFLEX MICROSCOPIC
Bilirubin Urine: NEGATIVE
Glucose, UA: NEGATIVE mg/dL
Ketones, ur: 15 mg/dL — AB
LEUKOCYTES UA: NEGATIVE
NITRITE: NEGATIVE
PH: 5.5 (ref 5.0–8.0)
Protein, ur: 100 mg/dL — AB
SPECIFIC GRAVITY, URINE: 1.026 (ref 1.005–1.030)
Urobilinogen, UA: 0.2 mg/dL (ref 0.0–1.0)

## 2013-10-03 LAB — LIPASE, BLOOD: LIPASE: 36 U/L (ref 11–59)

## 2013-10-03 MED ORDER — IOHEXOL 300 MG/ML  SOLN
100.0000 mL | Freq: Once | INTRAMUSCULAR | Status: AC | PRN
  Administered 2013-10-03: 100 mL via INTRAVENOUS

## 2013-10-03 MED ORDER — ONDANSETRON HCL 4 MG/2ML IJ SOLN: 4.0000 mg | Freq: Four times a day (QID) | INTRAMUSCULAR | Status: DC | PRN

## 2013-10-03 MED ORDER — THIAMINE HCL 100 MG/ML IJ SOLN
100.0000 mg | Freq: Every day | INTRAMUSCULAR | Status: DC
  Filled 2013-10-03: qty 1

## 2013-10-03 MED ORDER — SODIUM CHLORIDE 0.9 % IV SOLN
1000.0000 mL | Freq: Once | INTRAVENOUS | Status: AC
  Administered 2013-10-03: 1000 mL via INTRAVENOUS

## 2013-10-03 MED ORDER — LORAZEPAM 2 MG/ML IJ SOLN: 1.0000 mg | Freq: Four times a day (QID) | INTRAMUSCULAR | Status: DC | PRN

## 2013-10-03 MED ORDER — HEPARIN SODIUM (PORCINE) 5000 UNIT/ML IJ SOLN
5000.0000 [IU] | Freq: Three times a day (TID) | INTRAMUSCULAR | Status: DC
  Administered 2013-10-03 – 2013-10-04 (×2): 5000 [IU] via SUBCUTANEOUS
  Filled 2013-10-03 (×5): qty 1

## 2013-10-03 MED ORDER — ACETAMINOPHEN 650 MG RE SUPP: 650.0000 mg | Freq: Four times a day (QID) | RECTAL | Status: DC | PRN

## 2013-10-03 MED ORDER — ONDANSETRON 8 MG PO TBDP: 8.0000 mg | ORAL_TABLET | Freq: Three times a day (TID) | ORAL | Status: DC | PRN

## 2013-10-03 MED ORDER — LORAZEPAM 2 MG/ML IJ SOLN: 0.0000 mg | Freq: Two times a day (BID) | INTRAMUSCULAR | Status: DC

## 2013-10-03 MED ORDER — VITAMIN B-1 100 MG PO TABS
100.0000 mg | ORAL_TABLET | Freq: Every day | ORAL | Status: DC
  Administered 2013-10-04: 100 mg via ORAL
  Filled 2013-10-03: qty 1

## 2013-10-03 MED ORDER — ADULT MULTIVITAMIN W/MINERALS CH
1.0000 | ORAL_TABLET | Freq: Every day | ORAL | Status: DC
  Administered 2013-10-04: 1 via ORAL
  Filled 2013-10-03: qty 1

## 2013-10-03 MED ORDER — ONDANSETRON HCL 4 MG/2ML IJ SOLN
4.0000 mg | Freq: Once | INTRAMUSCULAR | Status: AC
Start: 2013-10-03 — End: 2013-10-03
  Administered 2013-10-03: 4 mg via INTRAVENOUS
  Filled 2013-10-03: qty 2

## 2013-10-03 MED ORDER — ONDANSETRON HCL 4 MG/2ML IJ SOLN
4.0000 mg | Freq: Once | INTRAMUSCULAR | Status: AC
  Administered 2013-10-03: 4 mg via INTRAVENOUS
  Filled 2013-10-03: qty 2

## 2013-10-03 MED ORDER — ACETAMINOPHEN 325 MG PO TABS: 650.0000 mg | ORAL_TABLET | Freq: Four times a day (QID) | ORAL | Status: DC | PRN

## 2013-10-03 MED ORDER — THIAMINE HCL 100 MG/ML IJ SOLN
100.0000 mg | Freq: Once | INTRAMUSCULAR | Status: AC
  Administered 2013-10-03: 100 mg via INTRAVENOUS
  Filled 2013-10-03: qty 2

## 2013-10-03 MED ORDER — THIAMINE HCL 100 MG/ML IJ SOLN
Freq: Once | INTRAVENOUS | Status: AC
  Administered 2013-10-03: via INTRAVENOUS
  Filled 2013-10-03: qty 1000

## 2013-10-03 MED ORDER — LORAZEPAM 1 MG PO TABS
1.0000 mg | ORAL_TABLET | Freq: Four times a day (QID) | ORAL | Status: DC | PRN
  Administered 2013-10-04: 1 mg via ORAL
  Filled 2013-10-03 (×2): qty 1

## 2013-10-03 MED ORDER — MORPHINE SULFATE 4 MG/ML IJ SOLN
6.0000 mg | Freq: Once | INTRAMUSCULAR | Status: AC
  Administered 2013-10-03: 6 mg via INTRAVENOUS
  Filled 2013-10-03: qty 2

## 2013-10-03 MED ORDER — SODIUM CHLORIDE 0.9 % IV SOLN
INTRAVENOUS | Status: AC
  Administered 2013-10-03: 23:00:00 via INTRAVENOUS

## 2013-10-03 MED ORDER — ALBUTEROL SULFATE (2.5 MG/3ML) 0.083% IN NEBU: 2.5000 mg | INHALATION_SOLUTION | RESPIRATORY_TRACT | Status: DC | PRN

## 2013-10-03 MED ORDER — LEVOTHYROXINE SODIUM 175 MCG PO TABS
175.0000 ug | ORAL_TABLET | Freq: Every day | ORAL | Status: DC
  Administered 2013-10-04: 175 ug via ORAL
  Filled 2013-10-03 (×2): qty 1

## 2013-10-03 MED ORDER — ONDANSETRON HCL 4 MG PO TABS: 4.0000 mg | ORAL_TABLET | Freq: Four times a day (QID) | ORAL | Status: DC | PRN

## 2013-10-03 MED ORDER — SERTRALINE HCL 100 MG PO TABS
100.0000 mg | ORAL_TABLET | Freq: Every day | ORAL | Status: DC
  Administered 2013-10-04: 100 mg via ORAL
  Filled 2013-10-03: qty 1

## 2013-10-03 MED ORDER — OXYCODONE HCL 5 MG PO TABS: 5.0000 mg | ORAL_TABLET | ORAL | Status: DC | PRN

## 2013-10-03 MED ORDER — FOLIC ACID 1 MG PO TABS
1.0000 mg | ORAL_TABLET | Freq: Every day | ORAL | Status: DC
  Administered 2013-10-04: 1 mg via ORAL
  Filled 2013-10-03: qty 1

## 2013-10-03 MED ORDER — SODIUM CHLORIDE 0.9 % IV SOLN: 1000.0000 mL | INTRAVENOUS | Status: DC

## 2013-10-03 MED ORDER — DEXTROSE-NACL 5-0.45 % IV SOLN: INTRAVENOUS | Status: DC

## 2013-10-03 MED ORDER — LORAZEPAM 2 MG/ML IJ SOLN
0.0000 mg | Freq: Four times a day (QID) | INTRAMUSCULAR | Status: DC
  Administered 2013-10-04: 1 mg via INTRAVENOUS
  Filled 2013-10-03 (×2): qty 1

## 2013-10-03 MED ORDER — LORAZEPAM 2 MG/ML IJ SOLN
1.0000 mg | Freq: Once | INTRAMUSCULAR | Status: AC
Start: 2013-10-03 — End: 2013-10-03
  Administered 2013-10-03: 1 mg via INTRAVENOUS
  Filled 2013-10-03: qty 1

## 2013-10-03 NOTE — ED Notes (Signed)
MD at bedside at this time.

## 2013-10-03 NOTE — ED Notes (Signed)
Bed: WA23 Expected date:  Expected time:  Means of arrival:  Comments: triage 

## 2013-10-03 NOTE — Discharge Instructions (Signed)
Abdominal Pain, Adult °Many things can cause abdominal pain. Usually, abdominal pain is not caused by a disease and will improve without treatment. It can often be observed and treated at home. Your health care provider will do a physical exam and possibly order blood tests and X-rays to help determine the seriousness of your pain. However, in many cases, more time must pass before a clear cause of the pain can be found. Before that point, your health care provider may not know if you need more testing or further treatment. °HOME CARE INSTRUCTIONS  °Monitor your abdominal pain for any changes. The following actions may help to alleviate any discomfort you are experiencing: °· Only take over-the-counter or prescription medicines as directed by your health care provider. °· Do not take laxatives unless directed to do so by your health care provider. °· Try a clear liquid diet (broth, tea, or water) as directed by your health care provider. Slowly move to a bland diet as tolerated. °SEEK MEDICAL CARE IF: °· You have unexplained abdominal pain. °· You have abdominal pain associated with nausea or diarrhea. °· You have pain when you urinate or have a bowel movement. °· You experience abdominal pain that wakes you in the night. °· You have abdominal pain that is worsened or improved by eating food. °· You have abdominal pain that is worsened with eating fatty foods. °SEEK IMMEDIATE MEDICAL CARE IF:  °· Your pain does not go away within 2 hours. °· You have a fever. °· You keep throwing up (vomiting). °· Your pain is felt only in portions of the abdomen, such as the right side or the left lower portion of the abdomen. °· You pass bloody or black tarry stools. °MAKE SURE YOU: °· Understand these instructions.   °· Will watch your condition.   °· Will get help right away if you are not doing well or get worse.   °Document Released: 04/27/2005 Document Revised: 05/08/2013 Document Reviewed: 03/27/2013 °[employer]® Patient  Information ©2014 [employer]. ° °

## 2013-10-03 NOTE — H&P (Signed)
Patient Demographics  Mary Graves, is a 68 y.o. female  MRN: [id number]   DOB - 1945-09-23  Admit Date - 10/03/2013  Outpatient Primary MD for the patient is Scarlette Calico, MD   With History of -  Past Medical History  Diagnosis Date  . Hypertension   . Hypothyroidism   . Alcoholism   . Pancreatitis       Past Surgical History  Procedure Laterality Date  . Tonsillectomy      in for   Abdominal pain.   HPI  Mary Graves  is a 68 y.o. female, with known history of alcohol abuse, patient presents with complaints of nausea vomiting and abdominal pain, patient reports she fell off the wagon, and has been drinking heavily since last month, reports she has fell of the wagon, reports over the last 2 weeks she has been drinking few bottles of white wine on a daily basis, and she has not been eating at all over the last 2 weeks, patient presents with left-sided abdominal pain, over the last few weeks, and reported having nausea and vomiting over the last few hours, and diarrhea, she denies any fever any chills any cough any productive sputum and he coffee-ground emesis or bright  red blood per rectum, patient was found to have ketone positive and elevated anion gap, she was given IV thiamine, and started on IV fluids and dextrose, for alcoholic ketoacidosis, as well she has some tremors, so she was started on CIWA protocol as well, patient CT abdomen and pelvis did not show any acute findings, lipase level was within normal limit.    Review of Systems    In addition to the HPI above,  No Fever-chills, No Headache, No changes with Vision or hearing, No problems swallowing food or Liquids, No Chest pain, Cough or Shortness of Breath, Complaints of abdominal pain, nausea, vomiting, and diarrhea No Blood in stool or  Urine, No dysuria, No new skin rashes or bruises, No new joints pains-aches,  No new weakness, tingling, numbness in any extremity, No recent weight gain or loss, No polyuria, polydypsia or polyphagia,   A full 10 point Review of Systems was done, except as stated above, all other Review of Systems were negative.   Social History History  Substance Use Topics  . Smoking status: Never Smoker   . Smokeless tobacco: Never Used  . Alcohol Use: No     Family History Family History  Problem Relation Age of Onset  . Cancer Other     Breast cancer  . Alcohol abuse Neg Hx   . Heart disease Neg Hx   . Hyperlipidemia Neg Hx   . Hypertension Neg Hx   . Stroke Neg Hx      Prior to Admission medications   Medication Sig Start Date End Date Taking? Authorizing Provider  ibuprofen (ADVIL,MOTRIN) 200 MG tablet Take 400 mg by mouth every 6 (six) hours as needed for moderate pain.  Yes Historical Provider, MD  levothyroxine (SYNTHROID) 175 MCG tablet Take 1 tablet (175 mcg total) by mouth daily before breakfast. 03/04/13  Yes Janith Lima, MD  sertraline (ZOLOFT) 100 MG tablet Take 1 tablet (100 mg total) by mouth daily. 06/05/12  Yes Waylan Boga, NP  ondansetron (ZOFRAN ODT) 8 MG disintegrating tablet Take 1 tablet (8 mg total) by mouth every 8 (eight) hours as needed for nausea or vomiting. 10/03/13   Hoy Morn, MD    Allergies  Allergen Reactions  . Ace Inhibitors     REACTION: cough    Physical Exam  Vitals  Blood pressure 193/87, pulse 103, temperature 98.7 F (37.1 C), temperature source Oral, resp. rate 18, height 5\' 6"  (1.676 m), weight 65.772 kg (145 lb), SpO2 96.00%.   1. General syncopating female lying in bed in NAD,  and anxious  2. Normal affect and insight, Not Suicidal or Homicidal, Awake Alert, Oriented X 3.  3. No F.N deficits, ALL C.Nerves Intact, Strength 5/5 all 4 extremities, Sensation intact all 4 extremities, Plantars down going. Has tremors,  4.  Ears and Eyes appear Normal, Conjunctivae clear, PERRLA. Moist Oral Mucosa.  5. Supple Neck, No JVD, No cervical lymphadenopathy appriciated, No Carotid Bruits.  6. Symmetrical Chest wall movement, Good air movement bilaterally, CTAB.  7. tachycardic but regular, No Gallops, Rubs or Murmurs, No Parasternal Heave.  8. Positive Bowel Sounds, Abdomen Soft, Non tender, No organomegaly appriciated,No rebound -guarding or rigidity.  9.  No Cyanosis, Normal Skin Turgor, No Skin Rash or Bruise.  10. Good muscle tone,  joints appear normal , no effusions, Normal ROM.  11. No Palpable Lymph Nodes in Neck or Axillae    Data Review  CBC  Recent Labs Lab 10/03/13 1630  WBC 6.3  HGB 14.2  HCT 40.3  PLT 152  MCV 88.8  MCH 31.3  MCHC 35.2  RDW 13.7  LYMPHSABS 1.9  MONOABS 0.3  EOSABS 0.0  BASOSABS 0.0   ------------------------------------------------------------------------------------------------------------------  Chemistries   Recent Labs Lab 10/03/13 1630 10/03/13 2123  NA 135*  --   K 4.4  --   CL 94*  --   CO2 13*  --   GLUCOSE 97  --   BUN 19  --   CREATININE 0.76  --   CALCIUM 8.4  --   MG  --  1.8  AST 44*  --   ALT 19  --   ALKPHOS 57  --   BILITOT 0.3  --    ------------------------------------------------------------------------------------------------------------------ estimated creatinine clearance is 63 ml/min (by C-G formula based on Cr of 0.76). ------------------------------------------------------------------------------------------------------------------ No results found for this basename: TSH, T4TOTAL, FREET3, T3FREE, THYROIDAB,  in the last 72 hours   Coagulation profile No results found for this basename: INR, PROTIME,  in the last 168 hours ------------------------------------------------------------------------------------------------------------------- No results found for this basename: DDIMER,  in the last 72  hours -------------------------------------------------------------------------------------------------------------------  Cardiac Enzymes No results found for this basename: CK, CKMB, TROPONINI, MYOGLOBIN,  in the last 168 hours ------------------------------------------------------------------------------------------------------------------ No components found with this basename: POCBNP,    ---------------------------------------------------------------------------------------------------------------  Urinalysis    Component Value Date/Time   COLORURINE YELLOW 10/03/2013 1849   APPEARANCEUR CLOUDY* 10/03/2013 1849   LABSPEC 1.026 10/03/2013 1849   PHURINE 5.5 10/03/2013 [place] 10/03/2013 1849   HGBUR SMALL* 10/03/2013 [place] 10/03/2013 1849   KETONESUR 15* 10/03/2013 1849   PROTEINUR 100* 10/03/2013 1849   UROBILINOGEN 0.2 10/03/2013 1849  NITRITE NEGATIVE 10/03/2013 1849   LEUKOCYTESUR NEGATIVE 10/03/2013 1849    ----------------------------------------------------------------------------------------------------------------  Imaging results:   Ct Abdomen Pelvis W Contrast  10/03/2013   CLINICAL DATA:  Epigastric pain. Nausea and vomiting. History of bowel: Disc pancreatitis. Hypertension.  EXAM: CT ABDOMEN AND PELVIS WITH CONTRAST  TECHNIQUE: Multidetector CT imaging of the abdomen and pelvis was performed using the standard protocol following bolus administration of intravenous contrast.  CONTRAST:  128mL OMNIPAQUE IOHEXOL 300 MG/ML  SOLN  COMPARISON:  No comparison CT.  Comparison ultrasound 10/22/2009.  FINDINGS: No evidence of pancreatitis or pancreatic mass.  Fatty infiltration liver without focal hepatic lesion.  Prominent size gallbladder without gallstones. If primary gallbladder abnormality were of high clinical concern ultrasound may then be considered for further delineation.  Left renal 4 cm cyst in the inferior pole parapelvic cyst. Lobular contour  kidneys without worrisome mass or hydronephrosis noted.  No splenic or adrenal lesion.  Hiatal hernia with mild enhancement of the mucosa. Mild thickening and enhancement of gastric pylorus. Mild enhancement of the proximal duodenum. Changes of inflammation involving the esophagus, stomach and duodenum not excluded given these findings but without clear ulceration. No extra luminal bowel inflammatory process, free fluid or free air. Portions of the colon are under distended and evaluation limited. Appendix not visualized.  Atherosclerotic type changes of the abdominal aorta with ectasia. No high-grade stenosis or focal aneurysm. Atherosclerotic type changes iliac arteries without high-grade stenosis. Mild narrowing proximal superior mesenteric artery.  Scoliosis and degenerative changes throughout the lumbar spine. Bilateral hip joint degenerative changes.  Lung bases clear.  IMPRESSION: No evidence of pancreatitis or pancreatic mass.  Fatty infiltration liver without focal hepatic lesion.  Prominent size gallbladder without gallstones. If primary gallbladder abnormality were of high clinical concern ultrasound may then be considered for further delineation.  Left renal 4 cm cyst in the inferior pole parapelvic cyst. Lobular contour kidneys without worrisome mass or hydronephrosis noted.  Hiatal hernia with mild enhancement of the mucosa. Mild thickening and enhancement of gastric pylorus. Mild enhancement of the proximal duodenum. Changes of inflammation involving the esophagus, stomach and duodenum not excluded given these findings but without clear ulceration. No extra luminal bowel inflammatory process, free fluid or free air. Portions of the colon are under distended and evaluation limited. Appendix not visualized.  Atherosclerotic type changes   Electronically Signed   By: Chauncey Cruel M.D.   On: 10/03/2013 20:12       Assessment & Plan  Principal Problem:   Alcoholic ketoacidosis Active Problems:    Alcohol dependence   HYPOTHYROIDISM    1. alcoholic/starvation ketoacidosis: Patient and her gap is 28, her bicarbonate is 13, and she is to tone positive, tachycardic and hypertensive , so she appears to be an alcoholic ketoacidosis .Patient was given IV thiamine in ED, encouraged to take by mouth intake, but has significant nausea and vomiting, will start her on D5 half-normal saline, have her on when necessary meds for nausea, will monitor her potassium, magnesium, phosphorus level closely and replace as needed. 2. Alcohol dependence: The patient appears to be in DTs, she will be started on CIWA protocol, with aggressive IV fluid, she'll be admitted to telemetry. 3. Hypothyroidism: Continue with Synthroid   DVT Prophylaxis Heparin -   SCDs  AM Labs Ordered, also please review Full Orders  Family Communication: Admission, patients condition and plan of care including tests being ordered have been discussed with the patient  who indicate understanding and agree with the  plan and Code Status.  Code Status Full  Likely DC to  Home.  Condition Critical  Time spent in minutes : 60 min    ELGERGAWY, DAWOOD M.D on 10/03/2013 at 10:37 PM   And look for the night coverage person covering me after hours  [facility] Office  [phone]

## 2013-10-03 NOTE — ED Notes (Signed)
Per pt states she has a history of pancreatitis-symptoms for over a week

## 2013-10-03 NOTE — ED Provider Notes (Signed)
CSN: [id number]     Arrival date & time 10/03/13  1546 History   First MD Initiated Contact with Patient 10/03/13 1846     Chief Complaint  Patient presents with  . Pancreatitis      HPI Patient reports left-sided abdominal pain over the past week.  She states she's been self-medicating with alcohol a controlled left-sided abdominal pain.  She's never had left-sided abdominal pain like this before.  She does report a history of pancreatitis from prior alcohol abuse and reports this feels slightly similar.  Nausea with vomiting as well as some diarrhea.  No fevers or chills.  Mild decreased oral intake today.  Symptoms are mild to moderate in severity.  Nothing improves her symptoms.   Past Medical History  Diagnosis Date  . Hypertension   . Hypothyroidism   . Alcoholism   . Pancreatitis    Past Surgical History  Procedure Laterality Date  . Tonsillectomy     Family History  Problem Relation Age of Onset  . Cancer Other     Breast cancer  . Alcohol abuse Neg Hx   . Heart disease Neg Hx   . Hyperlipidemia Neg Hx   . Hypertension Neg Hx   . Stroke Neg Hx    History  Substance Use Topics  . Smoking status: Never Smoker   . Smokeless tobacco: Never Used  . Alcohol Use: No   OB History   Grav Para Term Preterm Abortions TAB SAB Ect Mult Living                 Review of Systems  All other systems reviewed and are negative.      Allergies  Ace inhibitors  Home Medications   Current Outpatient Rx  Name  Route  Sig  Dispense  Refill  . ibuprofen (ADVIL,MOTRIN) 200 MG tablet   Oral   Take 400 mg by mouth every 6 (six) hours as needed for moderate pain.         Marland Kitchen levothyroxine (SYNTHROID) 175 MCG tablet   Oral   Take 1 tablet (175 mcg total) by mouth daily before breakfast.   90 tablet   1   . sertraline (ZOLOFT) 100 MG tablet   Oral   Take 1 tablet (100 mg total) by mouth daily.   30 tablet   0   . ondansetron (ZOFRAN ODT) 8 MG disintegrating tablet  Oral   Take 1 tablet (8 mg total) by mouth every 8 (eight) hours as needed for nausea or vomiting.   10 tablet   0    BP 193/87  Pulse 103  Temp(Src) 98.7 F (37.1 C) (Oral)  Resp 18  Ht 5\' 6"  (1.676 m)  Wt 145 lb (65.772 kg)  BMI 23.41 kg/m2  SpO2 96% Physical Exam  Nursing note and vitals reviewed. Constitutional: She is oriented to person, place, and time. She appears well-developed and well-nourished. No distress.  HENT:  Head: Normocephalic and atraumatic.  Eyes: EOM are normal.  Neck: Normal range of motion.  Cardiovascular: Normal rate, regular rhythm and normal heart sounds.   Pulmonary/Chest: Effort normal and breath sounds normal.  Abdominal: Soft. She exhibits no distension.  Left-sided abdominal tenderness without guarding or rebound.  Musculoskeletal: Normal range of motion.  Neurological: She is alert and oriented to person, place, and time.  Skin: Skin is warm and dry.  Psychiatric: She has a normal mood and affect. Judgment normal.    ED Course  Procedures (including critical  care time) Labs Review Labs Reviewed  COMPREHENSIVE METABOLIC PANEL - Abnormal; Notable for the following:    Sodium 135 (*)    Chloride 94 (*)    CO2 13 (*)    AST 44 (*)    GFR calc non Af Amer 85 (*)    All other components within normal limits  URINALYSIS, ROUTINE W REFLEX MICROSCOPIC - Abnormal; Notable for the following:    APPearance CLOUDY (*)    Hgb urine dipstick SMALL (*)    Ketones, ur 15 (*)    Protein, ur 100 (*)    All other components within normal limits  URINE MICROSCOPIC-ADD ON - Abnormal; Notable for the following:    Casts GRANULAR CAST (*)    All other components within normal limits  CBC WITH DIFFERENTIAL  LIPASE, BLOOD  ETHANOL   Imaging Review Ct Abdomen Pelvis W Contrast  10/03/2013   CLINICAL DATA:  Epigastric pain. Nausea and vomiting. History of bowel: Disc pancreatitis. Hypertension.  EXAM: CT ABDOMEN AND PELVIS WITH CONTRAST  TECHNIQUE:  Multidetector CT imaging of the abdomen and pelvis was performed using the standard protocol following bolus administration of intravenous contrast.  CONTRAST:  176mL OMNIPAQUE IOHEXOL 300 MG/ML  SOLN  COMPARISON:  No comparison CT.  Comparison ultrasound 10/22/2009.  FINDINGS: No evidence of pancreatitis or pancreatic mass.  Fatty infiltration liver without focal hepatic lesion.  Prominent size gallbladder without gallstones. If primary gallbladder abnormality were of high clinical concern ultrasound may then be considered for further delineation.  Left renal 4 cm cyst in the inferior pole parapelvic cyst. Lobular contour kidneys without worrisome mass or hydronephrosis noted.  No splenic or adrenal lesion.  Hiatal hernia with mild enhancement of the mucosa. Mild thickening and enhancement of gastric pylorus. Mild enhancement of the proximal duodenum. Changes of inflammation involving the esophagus, stomach and duodenum not excluded given these findings but without clear ulceration. No extra luminal bowel inflammatory process, free fluid or free air. Portions of the colon are under distended and evaluation limited. Appendix not visualized.  Atherosclerotic type changes of the abdominal aorta with ectasia. No high-grade stenosis or focal aneurysm. Atherosclerotic type changes iliac arteries without high-grade stenosis. Mild narrowing proximal superior mesenteric artery.  Scoliosis and degenerative changes throughout the lumbar spine. Bilateral hip joint degenerative changes.  Lung bases clear.  IMPRESSION: No evidence of pancreatitis or pancreatic mass.  Fatty infiltration liver without focal hepatic lesion.  Prominent size gallbladder without gallstones. If primary gallbladder abnormality were of high clinical concern ultrasound may then be considered for further delineation.  Left renal 4 cm cyst in the inferior pole parapelvic cyst. Lobular contour kidneys without worrisome mass or hydronephrosis noted.  Hiatal  hernia with mild enhancement of the mucosa. Mild thickening and enhancement of gastric pylorus. Mild enhancement of the proximal duodenum. Changes of inflammation involving the esophagus, stomach and duodenum not excluded given these findings but without clear ulceration. No extra luminal bowel inflammatory process, free fluid or free air. Portions of the colon are under distended and evaluation limited. Appendix not visualized.  Atherosclerotic type changes   Electronically Signed   By: Chauncey Cruel M.D.   On: 10/03/2013 20:12  I personally reviewed the imaging tests through PACS system I reviewed available ER/hospitalization records through the EMR    EKG Interpretation None      MDM   Final diagnoses:  Abdominal pain  Alcoholic ketoacidosis    Anion gap is 28, patient's bicarbonate is 13.  This appears  to be alcoholic ketoacidosis.  Patient be given on a milligrams of IV thiamine.  She'll be given fluids.  I last the nursing staff feed the patient with some carbohydrates as do think this will help her for sure process.  She'll be admitted for IV hydration overnight and ongoing dextrose treatment    Hoy Morn, MD 10/03/13 2144

## 2013-10-04 ENCOUNTER — Other Ambulatory Visit: Payer: Self-pay | Admitting: Internal Medicine

## 2013-10-04 DIAGNOSIS — R109 Unspecified abdominal pain: Secondary | ICD-10-CM

## 2013-10-04 LAB — COMPREHENSIVE METABOLIC PANEL
ALBUMIN: 3.3 g/dL — AB (ref 3.5–5.2)
ALT: 17 U/L (ref 0–35)
AST: 35 U/L (ref 0–37)
Alkaline Phosphatase: 41 U/L (ref 39–117)
BUN: 15 mg/dL (ref 6–23)
CO2: 21 mEq/L (ref 19–32)
Calcium: 7 mg/dL — ABNORMAL LOW (ref 8.4–10.5)
Chloride: 101 mEq/L (ref 96–112)
Creatinine, Ser: 0.64 mg/dL (ref 0.50–1.10)
GFR calc non Af Amer: 90 mL/min — ABNORMAL LOW (ref >90)
Glucose, Bld: 78 mg/dL (ref 70–99)
Potassium: 4.4 mEq/L (ref 3.7–5.3)
Sodium: 138 mEq/L (ref 137–147)
TOTAL PROTEIN: 5.6 g/dL — AB (ref 6.0–8.3)
Total Bilirubin: 0.6 mg/dL (ref 0.3–1.2)

## 2013-10-04 LAB — PHOSPHORUS: Phosphorus: 1.7 mg/dL — ABNORMAL LOW (ref 2.3–4.6)

## 2013-10-04 LAB — MAGNESIUM: Magnesium: 1.6 mg/dL (ref 1.5–2.5)

## 2013-10-04 MED ORDER — HYDRALAZINE HCL 20 MG/ML IJ SOLN
10.0000 mg | Freq: Once | INTRAMUSCULAR | Status: AC
  Administered 2013-10-04: 10 mg via INTRAVENOUS
  Filled 2013-10-04: qty 0.5

## 2013-10-04 MED ORDER — CHLORDIAZEPOXIDE HCL 25 MG PO CAPS: 50.0000 mg | ORAL_CAPSULE | Freq: Three times a day (TID) | ORAL | Status: DC | PRN

## 2013-10-04 MED ORDER — FOLIC ACID 1 MG PO TABS: 1.0000 mg | ORAL_TABLET | Freq: Every day | ORAL | Status: DC

## 2013-10-04 MED ORDER — AMLODIPINE BESYLATE 5 MG PO TABS: 5.0000 mg | ORAL_TABLET | Freq: Every day | ORAL | Status: DC

## 2013-10-04 MED ORDER — THIAMINE HCL 100 MG PO TABS: 100.0000 mg | ORAL_TABLET | Freq: Every day | ORAL | Status: DC

## 2013-10-04 NOTE — Clinical Social Work Psychosocial (Signed)
     Clinical Social Work Department BRIEF PSYCHOSOCIAL ASSESSMENT 10/04/2013  Patient:  Mary Graves, Mary Graves     Account Number:  [id number]     Admit date:  10/03/2013  Clinical Social Worker:  Venia Minks  Date/Time:  10/04/2013 12:00 M  Referred by:  RN  Date Referred:  10/04/2013 Referred for  Substance Abuse   Other Referral:   Interview type:  Patient Other interview type:    PSYCHOSOCIAL DATA Living Status:  ALONE Admitted from facility:   Level of care:   Primary support name:  Cephus Slater Primary support relationship to patient:  SIBLING Degree of support available:   fair    CURRENT CONCERNS Current Concerns  Substance Abuse   Other Concerns:    SOCIAL WORK ASSESSMENT / PLAN CSW met with patient. patient is alert and oriented X3. CSW consulted for alcohol cessation resources. SBIRT completed. Patient reports that she had been sober for the past 3 years and fell off the wagon two weeks ago. She states that she has been drinking daily for the past two weeks, a huge liter jug of wine. She states that she is retired and has too much time on her hands. She attends AA fairly regularly but will increase that support. She asked CSW for resources regarding intensive outpatient. CSW provided same to her. patient is interested in outpatient over at behavioural health. CSW encouraged her to increase her contact with her sponsor and to try to attend daily meetings. Triggers were discussed. patient states that boredom is a trigger for her. CSW talked to her about getting involved in the community and keeping active. CSW offered emotional support.   Assessment/plan status:   Other assessment/ plan:   Information/referral to community resources:    PATIENTS/FAMILYS RESPONSE TO PLAN OF CARE: Patient is hopeful that she can get back on the right track to recovery. She was able to maintain sobriety for the last three years and knows what to expect in regards to detoxing and  staying sober.

## 2013-10-04 NOTE — Discharge Summary (Signed)
Physician Discharge Summary  Mary Graves YTK:[id number] DOB: 07-05-1946 DOA: 10/03/2013  PCP: Scarlette Calico, MD  Admit date: 10/03/2013 Discharge date: 10/04/2013  Time spent: > 35 minutes  Recommendations for Outpatient Follow-up:  1. Please continue to encourage alcohol cessation 2. Monitor blood pressures. Will provide prescription for amlodipine on discharge 3. Pt will be prescribed librium for her alcohol withdrawal  Discharge Diagnoses:  Principal Problem:   Alcoholic ketoacidosis Active Problems:   HYPOTHYROIDISM   Alcohol dependence   Discharge Condition: stable  Diet recommendation: as tolerated  Filed Weights   10/03/13 1556 10/03/13 2312  Weight: 65.772 kg (145 lb) 61.78 kg (136 lb 3.2 oz)    History of present illness:  Patient is a 68 y/o with history of alcohol abuse who presented with nausea and vomiting as well as abdominal discomfort.  Reportedly patient had been drinking heavily for the last month.  Was found to have alcohol/starvation ketoacidosis.  Hospital Course:  Alcohol/starvation ketoacidosis - Resolved with alcohol cessation and IVF's.  - Patient's nausea has resolved and currently she is tolerating oral intake - States that her sister removed all the alcohol in her house and that she plans on going to [employer] - will provide librium on discharge  HTN - could be 2ary to withdrawal but will provide script for amlodipine on discharge  Procedures:  None  Consultations:  none  Discharge Exam: Filed Vitals:   10/04/13 1137  BP: 176/79  Pulse: 89  Temp:   Resp:     General: Pt in NAD, Alert and awake Cardiovascular: RRR, no MRG Respiratory: CTA BL, no wheezes  Discharge Instructions  Discharge Orders   Future Orders Complete By Expires   Call MD for:  persistant nausea and vomiting  As directed    Call MD for:  temperature >100.4  As directed    Diet - low sodium heart healthy  As directed    Discharge instructions  As directed     Comments:     Please continue to avoid any alcohol intake.   Increase activity slowly  As directed        Medication List    STOP taking these medications       ibuprofen 200 MG tablet  Commonly known as:  ADVIL,MOTRIN      TAKE these medications       amLODipine 5 MG tablet  Commonly known as:  NORVASC  Take 1 tablet (5 mg total) by mouth daily.     chlordiazePOXIDE 25 MG capsule  Commonly known as:  LIBRIUM  Take 2 capsules (50 mg total) by mouth 3 (three) times daily as needed for anxiety or withdrawal.     folic acid 1 MG tablet  Commonly known as:  FOLVITE  Take 1 tablet (1 mg total) by mouth daily.     levothyroxine 175 MCG tablet  Commonly known as:  SYNTHROID  Take 1 tablet (175 mcg total) by mouth daily before breakfast.     ondansetron 8 MG disintegrating tablet  Commonly known as:  ZOFRAN ODT  Take 1 tablet (8 mg total) by mouth every 8 (eight) hours as needed for nausea or vomiting.     sertraline 100 MG tablet  Commonly known as:  ZOLOFT  Take 1 tablet (100 mg total) by mouth daily.     thiamine 100 MG tablet  Take 1 tablet (100 mg total) by mouth daily.       Allergies  Allergen Reactions  . Ace Inhibitors  REACTION: cough       Follow-up Information   Follow up with [facility]. (Return to ER for any new or worsening symptoms)    Specialty:  Emergency Medicine   Contact information:   [id number] [place] [address] [phone]      Schedule an appointment as soon as possible for a visit with Scarlette Calico, MD.   Specialty:  Internal Medicine   Contact information:   [address] [address] [phone]        The results of significant diagnostics from this hospitalization (including imaging, microbiology, ancillary and laboratory) are listed below for reference.    Significant Diagnostic Studies: Ct Abdomen Pelvis W Contrast  10/03/2013    CLINICAL DATA:  Epigastric pain. Nausea and vomiting. History of bowel: Disc pancreatitis. Hypertension.  EXAM: CT ABDOMEN AND PELVIS WITH CONTRAST  TECHNIQUE: Multidetector CT imaging of the abdomen and pelvis was performed using the standard protocol following bolus administration of intravenous contrast.  CONTRAST:  155mL OMNIPAQUE IOHEXOL 300 MG/ML  SOLN  COMPARISON:  No comparison CT.  Comparison ultrasound 10/22/2009.  FINDINGS: No evidence of pancreatitis or pancreatic mass.  Fatty infiltration liver without focal hepatic lesion.  Prominent size gallbladder without gallstones. If primary gallbladder abnormality were of high clinical concern ultrasound may then be considered for further delineation.  Left renal 4 cm cyst in the inferior pole parapelvic cyst. Lobular contour kidneys without worrisome mass or hydronephrosis noted.  No splenic or adrenal lesion.  Hiatal hernia with mild enhancement of the mucosa. Mild thickening and enhancement of gastric pylorus. Mild enhancement of the proximal duodenum. Changes of inflammation involving the esophagus, stomach and duodenum not excluded given these findings but without clear ulceration. No extra luminal bowel inflammatory process, free fluid or free air. Portions of the colon are under distended and evaluation limited. Appendix not visualized.  Atherosclerotic type changes of the abdominal aorta with ectasia. No high-grade stenosis or focal aneurysm. Atherosclerotic type changes iliac arteries without high-grade stenosis. Mild narrowing proximal superior mesenteric artery.  Scoliosis and degenerative changes throughout the lumbar spine. Bilateral hip joint degenerative changes.  Lung bases clear.  IMPRESSION: No evidence of pancreatitis or pancreatic mass.  Fatty infiltration liver without focal hepatic lesion.  Prominent size gallbladder without gallstones. If primary gallbladder abnormality were of high clinical concern ultrasound may then be considered for  further delineation.  Left renal 4 cm cyst in the inferior pole parapelvic cyst. Lobular contour kidneys without worrisome mass or hydronephrosis noted.  Hiatal hernia with mild enhancement of the mucosa. Mild thickening and enhancement of gastric pylorus. Mild enhancement of the proximal duodenum. Changes of inflammation involving the esophagus, stomach and duodenum not excluded given these findings but without clear ulceration. No extra luminal bowel inflammatory process, free fluid or free air. Portions of the colon are under distended and evaluation limited. Appendix not visualized.  Atherosclerotic type changes   Electronically Signed   By: Chauncey Cruel M.D.   On: 10/03/2013 20:12    Microbiology: No results found for this or any previous visit (from the past 240 hour(s)).   Labs: Basic Metabolic Panel:  Recent Labs Lab 10/03/13 1630 10/03/13 2123 10/04/13 0333  NA 135*  --  138  K 4.4  --  4.4  CL 94*  --  101  CO2 13*  --  21  GLUCOSE 97  --  78  BUN 19  --  15  CREATININE 0.76  --  0.64  CALCIUM 8.4  --  7.0*  MG  --  1.8 1.6  PHOS  --  1.9* 1.7*   Liver Function Tests:  Recent Labs Lab 10/03/13 1630 10/04/13 0333  AST 44* 35  ALT 19 17  ALKPHOS 57 41  BILITOT 0.3 0.6  PROT 7.5 5.6*  ALBUMIN 4.1 3.3*    Recent Labs Lab 10/03/13 1630  LIPASE 36   No results found for this basename: AMMONIA,  in the last 168 hours CBC:  Recent Labs Lab 10/03/13 1630  WBC 6.3  NEUTROABS 4.0  HGB 14.2  HCT 40.3  MCV 88.8  PLT 152   Cardiac Enzymes: No results found for this basename: CKTOTAL, CKMB, CKMBINDEX, TROPONINI,  in the last 168 hours BNP: BNP (last 3 results) No results found for this basename: PROBNP,  in the last 8760 hours CBG: No results found for this basename: GLUCAP,  in the last 168 hours     Signed:  Velvet Bathe  [facility] 10/04/2013, 1:43 PM

## 2013-10-04 NOTE — Progress Notes (Signed)
Pt discharged by MD to home. Pt told RN that she had a friend coming to pick her up. RN got patient dressed and reviewed discharge instructions with pt. Pt then proceeded to tell RN that she was planning on driving her self home in the car that she drove herself to the hospital in last night. Pt informed that this plan was not safe for the pt or people in the community. Made several attempts to contact pt's sister, Sharlet Salina, but kept getting voicemail. Pt called sister in RN's presence and pretended to have a conversation with her when in fact the RN could hear the other end of the call and heard the voicemail pickup and so pt did not talk with sister. Pt then hung up the phone and stated that her sister was on the way and that she wanted to be wheeled down to the lobby to wait. Pt was informed by RN that she heard the other end of the line and knew that she did not actually speak with her sister. Concerns for safety expressed to Charge Nurse and she contacted security for support. Security and [employer] came up to pt's room and had a conversation with pt and encouraged her not to drive. Pt told that we had a bus voucher we could give her and pt agreed at that time to take the bus. Staff attempted to find out the bus schedule in order to have pt at the bus stop at the time of pickup. Before pt could be taken to bus stop, pt's sister arrived at the hospital. Pt's sister expressed concerns for pt's safety at home d/t the fact that she lives at home and is still detoxing from her ETOH abuse and is unsteady on her feet. Sister informed that the RN asked pt if she would be safe at home alone and that she would not be at risk of falling. Pt reported to RN that she would be fine and that she was just going to spend the rest of her detox time on her couch. Pt asked how she would get her meals and she reported that she had a neighbor who could help with that. MD paged at least 4 times over the next hour and a half and was informed  via text page that the pt's sister was at the bedside and that she had concerns for pt going home alone today. MD never called back. Pt's sister informed at 66 that the MD went off duty at 77 and that after that time the RN would have no way of getting in contact with him, and that we were not sure what we would do at that point. At [place] the pt's sister approached RN and stated that she and the patient were going to leave the hospital because it had gotten so late at this point and because pt had prescriptions that she needed to have filled tonight in order to take the meds that were prescribed. Pt wheeled down by staff and released into her sister's care. At 43, MD still had not returned any pages. Pt and her sister encouraged to come back to the ED if they had any further issues.  Othella Boyer [facility] 10/04/2013 7:43 PM

## 2013-10-04 NOTE — Care Management Note (Signed)
    Page 1 of 1   10/04/2013     11:57:29 AM   CARE MANAGEMENT NOTE 10/04/2013  Patient:  Mary Graves, Mary Graves   Account Number:  [id number]  Date Initiated:  10/04/2013  Documentation initiated by:  Dessa Phi  Subjective/Objective Assessment:   68 Y/O F ADMITTED W/ALCOHOLIC KETOACIDOSIS.     Action/Plan:   FROM HOME.HAS PCP,PHARMACY.   Anticipated DC Date:  10/07/2013   Anticipated DC Plan:  [place]  CM consult      Choice offered to / List presented to:             Status of service:  In process, will continue to follow Medicare Important Message given?   (If response is "NO", the following Medicare IM given date fields will be blank) Date Medicare IM given:   Date Additional Medicare IM given:    Discharge Disposition:    Per UR Regulation:  Reviewed for med. necessity/level of care/duration of stay  If discussed at Long Length of Stay Meetings, dates discussed:    Comments:  10/04/13 Mckynleigh Mussell RN,BSN NCM [phone] NO ANTICIPATED D/C NEEDS.

## 2013-10-04 NOTE — Progress Notes (Signed)
Pt's BP was high for 2pm vital sign check. BP 182/85, HR 98. Pt had orders for discharge. MD made aware of findings to be sure that pt was still appropriate for discharge. Order received to give 10mg  IV hydralazine once and to recheck BP in one hour. Med given per order and BP rechecked an hour later. BP was 161/70, HR 107. MD made aware of these findings and he stated that pt was still appropriate for discharge at this time and to instruct her to take her amlodipine dose today once she gets it from the pharmacy. Will instruct pt to take her medications and will review discharge instructions. Pt to discharge home.  Othella Boyer [facility] 10/04/2013 4:13 PM

## 2013-10-09 ENCOUNTER — Other Ambulatory Visit (INDEPENDENT_AMBULATORY_CARE_PROVIDER_SITE_OTHER): Payer: Medicare Other

## 2013-10-09 ENCOUNTER — Encounter: Payer: Self-pay | Admitting: Internal Medicine

## 2013-10-09 ENCOUNTER — Ambulatory Visit (INDEPENDENT_AMBULATORY_CARE_PROVIDER_SITE_OTHER): Payer: Medicare Other | Admitting: Internal Medicine

## 2013-10-09 VITALS — BP 130/80 | HR 83 | Temp 98.4°F | Resp 16 | Ht 66.0 in | Wt 134.5 lb

## 2013-10-09 DIAGNOSIS — E039 Hypothyroidism, unspecified: Secondary | ICD-10-CM

## 2013-10-09 DIAGNOSIS — N181 Chronic kidney disease, stage 1: Secondary | ICD-10-CM

## 2013-10-09 DIAGNOSIS — F329 Major depressive disorder, single episode, unspecified: Secondary | ICD-10-CM

## 2013-10-09 DIAGNOSIS — F1020 Alcohol dependence, uncomplicated: Secondary | ICD-10-CM

## 2013-10-09 DIAGNOSIS — I10 Essential (primary) hypertension: Secondary | ICD-10-CM

## 2013-10-09 DIAGNOSIS — F0390 Unspecified dementia without behavioral disturbance: Secondary | ICD-10-CM

## 2013-10-09 LAB — BASIC METABOLIC PANEL
BUN: 30 mg/dL — ABNORMAL HIGH (ref 6–23)
CALCIUM: 12.3 mg/dL — AB (ref 8.4–10.5)
CO2: 28 meq/L (ref 19–32)
Chloride: 100 mEq/L (ref 96–112)
Creatinine, Ser: 1.3 mg/dL — ABNORMAL HIGH (ref 0.4–1.2)
GFR: 44.82 mL/min — ABNORMAL LOW (ref >60.00)
Glucose, Bld: 93 mg/dL (ref 70–99)
Potassium: 3.9 mEq/L (ref 3.5–5.1)
Sodium: 136 mEq/L (ref 135–145)

## 2013-10-09 LAB — TSH: TSH: 23.04 u[IU]/mL — AB (ref 0.35–5.50)

## 2013-10-09 MED ORDER — LEVOTHYROXINE SODIUM 175 MCG PO TABS: 175.0000 ug | ORAL_TABLET | Freq: Every day | ORAL | Status: DC

## 2013-10-09 NOTE — Progress Notes (Signed)
Subjective:    Patient ID: Mary Graves, female    DOB: [birth year], 68 y.o.   MRN: [id number]  HPI Comments: She had a recent inpatient stay for alcohol intoxication with ketoacidosis, she was discharged last week to her home. It does not sound like she has much support. She is not willing to do an inpatient or outpatient program for EtOH abuse. She is disorganized today.  Thyroid Problem Presents for follow-up visit. Symptoms include anxiety, depressed mood and fatigue. Patient reports no cold intolerance, constipation, diaphoresis, diarrhea, dry skin, hair loss, heat intolerance, hoarse voice, leg swelling, nail problem, palpitations, tremors, visual change, weight gain or weight loss. The symptoms have been worsening. Treatments tried: she has not taken synthroid for several months. Her past medical history is significant for dementia. There is no history of atrial fibrillation, diabetes, Graves' ophthalmopathy, heart failure, hyperlipidemia, neuropathy, obesity or osteopenia.      Review of Systems  Constitutional: Positive for fatigue. Negative for fever, chills, weight loss, weight gain, diaphoresis, activity change, appetite change and unexpected weight change.  HENT: Negative.  Negative for hoarse voice.   Eyes: Negative.   Respiratory: Negative.  Negative for choking, chest tightness, shortness of breath, wheezing and stridor.   Cardiovascular: Negative.  Negative for chest pain, palpitations and leg swelling.  Gastrointestinal: Negative.  Negative for diarrhea and constipation.  Endocrine: Negative.  Negative for cold intolerance and heat intolerance.  Genitourinary: Negative.   Musculoskeletal: Negative.   Skin: Negative.   Allergic/Immunologic: Negative.   Neurological: Negative.  Negative for tremors.  Hematological: Negative.  Negative for adenopathy. Does not bruise/bleed easily.  Psychiatric/Behavioral: Positive for confusion, dysphoric mood and decreased concentration.  Negative for suicidal ideas, hallucinations, behavioral problems, sleep disturbance, self-injury and agitation. The patient is not nervous/anxious and is not hyperactive.        Objective:   Physical Exam  Vitals reviewed. Constitutional: She is oriented to person, place, and time. She appears well-developed and well-nourished. No distress.  HENT:  Head: Normocephalic and atraumatic.  Mouth/Throat: Oropharynx is clear and moist. No oropharyngeal exudate.  Eyes: Conjunctivae are normal. Right eye exhibits no discharge. Left eye exhibits no discharge. No scleral icterus.  Neck: Normal range of motion. Neck supple. No JVD present. No tracheal deviation present. No thyromegaly present.  Cardiovascular: Normal rate, regular rhythm, normal heart sounds and intact distal pulses.  Exam reveals no gallop and no friction rub.   No murmur heard. Pulmonary/Chest: Effort normal and breath sounds normal. No stridor. No respiratory distress. She has no wheezes. She has no rales. She exhibits no tenderness.  Abdominal: Soft. Bowel sounds are normal. She exhibits no distension and no mass. There is no tenderness. There is no rebound and no guarding.  Musculoskeletal: Normal range of motion. She exhibits no edema.  Lymphadenopathy:    She has no cervical adenopathy.  Neurological: She is alert and oriented to person, place, and time. She has normal reflexes. No cranial nerve deficit. She exhibits normal muscle tone. Coordination normal.  Skin: Skin is warm and dry. No rash noted. She is not diaphoretic. No erythema. No pallor.  Psychiatric: Judgment and thought content normal. Her mood appears not anxious. Her affect is not angry, not blunt and not inappropriate. Her speech is delayed and tangential. Her speech is not rapid and/or pressured. She is slowed. She is not agitated, not aggressive, not hyperactive, not withdrawn, not actively hallucinating and not combative. Thought content is not paranoid and not  delusional. Cognition and  memory are impaired. She does not express impulsivity or inappropriate judgment. She does not exhibit a depressed mood. She expresses no homicidal and no suicidal ideation. She expresses no suicidal plans and no homicidal plans. She is communicative. She exhibits abnormal recent memory and abnormal remote memory. She is inattentive.     Lab Results  Component Value Date   WBC 6.3 10/03/2013   HGB 14.2 10/03/2013   HCT 40.3 10/03/2013   PLT 152 10/03/2013   GLUCOSE 78 10/04/2013   CHOL 212* 02/27/2013   TRIG 70.0 02/27/2013   HDL 71.50 02/27/2013   LDLDIRECT 119.5 02/27/2013   LDLCALC 94 10/17/2011   ALT 17 10/04/2013   AST 35 10/04/2013   NA 138 10/04/2013   K 4.4 10/04/2013   CL 101 10/04/2013   CREATININE 0.64 10/04/2013   BUN 15 10/04/2013   CO2 21 10/04/2013   TSH 20.12* 02/27/2013   HGBA1C 5.5 02/27/2013       Assessment & Plan:

## 2013-10-09 NOTE — Assessment & Plan Note (Signed)
She will restart synthroid

## 2013-10-09 NOTE — Assessment & Plan Note (Signed)
I have asked her to see psych for ongoing eval and management

## 2013-10-09 NOTE — Assessment & Plan Note (Signed)
I have asked her to pursue further treatment options for this She tells me that she has stopped drinking, I will check her BAL today to confirm

## 2013-10-09 NOTE — Patient Instructions (Signed)

## 2013-10-09 NOTE — Assessment & Plan Note (Signed)
[facility] referral to help her with her meds and disease states I suspect this is related to EtOH, will refer to neurology for further evaluation

## 2013-10-10 LAB — ETHANOL

## 2013-10-11 ENCOUNTER — Telehealth: Payer: Self-pay | Admitting: *Deleted

## 2013-10-11 NOTE — Telephone Encounter (Signed)
Patient phoned triage line to request lab results.  Per EMR review, Dr. Ronnald Ramp has already replied to patient's email answering her question.

## 2013-10-15 ENCOUNTER — Encounter (HOSPITAL_COMMUNITY): Payer: Self-pay | Admitting: Psychology

## 2013-10-16 ENCOUNTER — Other Ambulatory Visit (HOSPITAL_COMMUNITY): Payer: Medicare Other | Attending: Psychiatry | Admitting: Psychology

## 2013-10-16 DIAGNOSIS — F3289 Other specified depressive episodes: Secondary | ICD-10-CM | POA: Insufficient documentation

## 2013-10-16 DIAGNOSIS — F4310 Post-traumatic stress disorder, unspecified: Secondary | ICD-10-CM | POA: Insufficient documentation

## 2013-10-16 DIAGNOSIS — E039 Hypothyroidism, unspecified: Secondary | ICD-10-CM | POA: Insufficient documentation

## 2013-10-16 DIAGNOSIS — I10 Essential (primary) hypertension: Secondary | ICD-10-CM | POA: Insufficient documentation

## 2013-10-16 DIAGNOSIS — F329 Major depressive disorder, single episode, unspecified: Secondary | ICD-10-CM | POA: Insufficient documentation

## 2013-10-16 DIAGNOSIS — F1020 Alcohol dependence, uncomplicated: Secondary | ICD-10-CM

## 2013-10-16 DIAGNOSIS — Z87891 Personal history of nicotine dependence: Secondary | ICD-10-CM | POA: Insufficient documentation

## 2013-10-17 NOTE — Progress Notes (Signed)
    Daily Group Progress Note  Program: CD-IOP   Group Time: 1:00-2:30pm  Participation Level: Active  Behavioral Response: Appropriate  Type of Therapy: Process Group  Topic: Group members checked in by sharing their names and sobriety dates.  Counselor led group in 5 minutes of HeartMath breathing in order to promote relaxation and focus.  Group members then discussed how they were doing and shared about issues related to early recovery, including managing volatile emotions and PAWS symptoms, grief and loss, and making friends with others in recovery.  Three new group members were present, and each one shared about their reasons for seeking treatment in the program.       Group Time: 2:45-4:00pm  Participation Level: Active  Behavioral Response: Appropriate  Type of Therapy: Psycho-education Group  Topic: Counselor led a session on Distress Tolerance.  She led an exercise to demonstrate distress tolerance, asking group members hold their arms in front of them for about 2 minutes and then having them share how they coped with the discomfort.  She reviewed distraction strategies (represented by the acronym ACCEPTS) and asked group members to provide examples of these strategies.  She also explained the concept of self-soothing and had group members brainstorm self-soothing methods for each of the five senses.  Counselor emphasized that these strategies were especially helpful for managing cravings in early recovery.   Summary: Patient reported a sobriety date of 3/6.  This was her first group session, and she shared with the group that she was seeking treatment for alcohol addiction.  She reported that she had previously been in recovery and had relapsed after being given a prescription for painkillers for arthritis.  She reported that she was "lucky to be alive" after bingeing for about a month, and that since becoming sober she was on an "Statistician."  She shared that  she considers herself a "loner" and attributed her isolation to her fear of abandonment, which stemmed from her father's abandoning her at a young age.  Patient showed willingness to be open with the group and connect with others.   Family Program: Family present? No   Name of family member(s): n/a  UDS collected: Yes Results: not yet available  AA/NA attended?: YesTuesday  Sponsor?: No   [facility]

## 2013-10-18 ENCOUNTER — Encounter (HOSPITAL_COMMUNITY): Payer: Self-pay | Admitting: Psychology

## 2013-10-18 ENCOUNTER — Other Ambulatory Visit (HOSPITAL_COMMUNITY): Payer: Medicare Other

## 2013-10-18 NOTE — Progress Notes (Unsigned)
Patient ID: Mary Graves, female   DOB: 03/27/1946, 68 y.o.   MRN: [id number]  PHONE CALL  PT CALLED AT PHONE # [facility] TO CHECK STATUS AFTER CANCELLATION OF IOP.SHE IS NEW TO PROGRAM AND WAS SCHEDULED FOR INTAKE TODAY-REGRETS EXTENDED AND INVITATION TO CALL BACK EXTENDED.

## 2013-10-20 NOTE — Progress Notes (Unsigned)
Mary Graves . Orientation to CD-IOP: The patient is a 68 yo divorced, Caucasian, female seeking entry into the CD-IOP to address her alcohol dependence. She lives in [place]. The patient reported she had relapsed in mid-February after almost 3  years of sobriety. The patient reported that in early February she had met Dr. Nelva Bush, an orthopedic surgeon. He had diagnosed her neck pain as arthritis in the C-5, 6 and 7. He had prescribed Hydrocodone and the patient admitted she did not disclose her alcohol dependency status to him. The patient reported she quickly found herself taking more of the pain medication than was prescribed and within 10 days, she had relapsed on alcohol. The patient reported she was drinking 2-liter bottles of wine per day. She is unclear how many days she drank before going to the hospital, but feels certain it included at least 10 days of heavy drinking. The patient reported she was really "out of it" and noted that her PCP, Scarlette Calico, MD, had set up an appointment to see a neurologist, because she was so confused and unstable. The patient reported she had attended the 10 am AA meeting on March 12th, but so many people thought she was drunk because of her speech and unstable gate that she has decided not to return for the time being. The patient has a long history of alcoholism and has been in treatment before. Most recently, she was enrolled in this program approximately 3  years ago, but opted to leave early because she felt like she had gotten what she needed. The patient has an older sister who lives in [place] and is very supportive and a younger sister who lives in [place], [place]. The patient reported a close relationship with both siblings. 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 She lived in the [place] area and her route [place] of the [place]. She worked for [employer] for 30 years. She drank heavily during those  years. She also married and had a child, but the marriage ended in divorce. She is not close with her ex-husband or their daughter. The patient moved to [facility] after retirement to be closer to her family.  Her 16 yo mother is suffering from Alzheimer's and living at [facility], a retirement community in [place], [place]. Her step-father is also there, but he is in his own apartment while his wife is in a special care unit. The patient has lived a distant and withdrawn life with little engagement or close connections with others. She reminded me that her father had walked out on her, her mother and sisters when she was 52 yo. This abandonment has remained the single defining event in her life and the patient has carried this pain of loss and rejection to this day.  The patient cried as she recounted how much she had loved her father and how she has never been willing to risk becoming vulnerable out of fear of being hurt like that again. The patient has been diagnosed with depression and has been monitored and prescribed Zoloft by Ayesha Rumpf, PA-C, at [facility].  The patient had not eaten for days during the relapse and was also diagnosed with alcoholic ketoacidosis. She has had pancreatitis in the past. The patient was very depressed and teary throughout the orientation. She will return tomorrow to begin the CD-IOP.        Lilymarie Scroggins, LCAS

## 2013-10-21 ENCOUNTER — Other Ambulatory Visit (HOSPITAL_COMMUNITY): Payer: Medicare Other | Admitting: Psychology

## 2013-10-23 ENCOUNTER — Other Ambulatory Visit (HOSPITAL_COMMUNITY): Payer: Medicare Other | Admitting: Psychology

## 2013-10-23 DIAGNOSIS — F1020 Alcohol dependence, uncomplicated: Secondary | ICD-10-CM

## 2013-10-24 NOTE — Progress Notes (Unsigned)
    Daily Group Progress Note  Program: CD-IOP   Group Time: 1:00-2:30pm  Participation Level: Active  Behavioral Response: Appropriate  Type of Therapy: Process Group  Topic: The first half of group was spent in process.  Counselor opened the group with a check-in and then led 5 minutes of a relaxation breathing exercise including some calming music.  Counselor then led a review on the group expectations and beliefs, and group members discussed concerns and suggestions about these expectations. Much of the conversation centered around the therapeutic benefits of sharing feelings and attending to others while they share. Group members then shared about issues they experienced during early recovery, including dealing with family health issues, handling denial, and finding a recovery community that meets their needs.     Group Time: 2:45-4:00pm  Participation Level: Active  Behavioral Response: Appropriate  Type of Therapy: Psycho-education Group  Topic: Counselor led a discussion on the Serenity Prayer.   Group members spent time creating lists of things that they can and cannot change, and then shared their lists with the entire group.  Counselor emphasized that in recovery, addicts have the ability to change their own behaviors, attitudes, and outlook, and cannot change other people, the past, or unforeseen events.  Counselor asked clients to share one thing they could change that day in order to better support their recovery, and several group members shared ideas.   Summary: Patient's sobriety date remains 3/6. She reported that the relaxation exercise brought her attention to chronic pain in her neck, and that she realized she needed to learn to manage that pain without prescription painkillers. During the process group, she provided feedback with several other group members who shared.  She shared about her relationship with her sisters and received feedback about an issue with  her daughter.  Patient is doing well in early recovery and shares openly with the group.   Family Program: Family present? No   Name of family member(s): n/a  UDS collected: No Results: n/a  AA/NA attended?: YesTuesday and Wednesday  Sponsor?: No   [facility]

## 2013-10-25 ENCOUNTER — Other Ambulatory Visit (HOSPITAL_COMMUNITY): Payer: Medicare Other | Admitting: Psychology

## 2013-10-25 DIAGNOSIS — F1020 Alcohol dependence, uncomplicated: Secondary | ICD-10-CM

## 2013-10-28 ENCOUNTER — Other Ambulatory Visit (HOSPITAL_COMMUNITY): Payer: Medicare Other | Admitting: Psychology

## 2013-10-29 ENCOUNTER — Encounter (HOSPITAL_COMMUNITY): Payer: Self-pay | Admitting: Psychology

## 2013-10-29 NOTE — Progress Notes (Unsigned)
Mary Graves  CD-IOP: Treatment Planning Session. The patient appeared for scheduled session today. She had attended the 7:30 NA meeting this morning and reported it had been very good. I explained the need for identifying goals of treatment and the patient agreed that maintaining her sobriety and building support for her recovery were the two most important goals. The patient reported she had secured a temporary sponsor. The woman is new to [facility], but has attained 36 years of sobriety. She explained she felt she needed to get someone quickly since she has been sober now almost 30 days. When asked what other goals she might have, the patient reported she doesn't know what she is feeling. She wants to learn to identify her feelings and then articulate them in an assertive, but respectful way. We agreed that the patient has been a passive aggressive communicator her entire life and at 68 yo, it is time to learn to express what she is feeling and stop holding in her anger and pain. The documentation was reviewed, signed and the treatment plan completed accordingly. The patient is doing well in early recovery and is much more balanced and emotionally stable than when she first arrived last week. We will continue to follow closely in the days ahead. Her sobriety date 3/6.      Gizel Riedlinger, LCAS

## 2013-10-29 NOTE — Progress Notes (Signed)
    Daily Group Progress Note  Program: CD-IOP   Group Time: 1-2:30 pm  Participation Level: Active  Behavioral Response: Appropriate and Sharing  Type of Therapy: Process Group  Topic: Group Process; the first part of group was spent in process. After check-in, a member made a heartfelt and painful confession that he had lied to the group during the last session. He had relapsed, but been so embarrassed and ashamed that he couldn't admit it on Wednesday. The group provided positive and validating feedback and emphasized the good news, which was that he had "come back". A new member was also present and he introduced himself. He had also relapsed after 10+ years of sobriety and he received a warm welcome from his new group members. During this session the program director was meeting with new group members and current ones regarding medications and any issues or concerns about their prescriptions.   Group Time: 2:45- 4pm  Participation Level: Active  Behavioral Response: Sharing  Type of Therapy: Psycho-education Group  Topic: Psycho Ed; the second part of group was spent in an educational presentation. The group was asked to identify the pros and cons of sobriety versus active addiction. Members were quick to identify the pros or positives of active addiction and the cons. They were able to easily identify the pros of sobriety, but had a tough time identifying the cons of sobriety. There was good interaction and feedback among members and it proved insightful and provided a bonding or experience of connection among the group.   Summary: The patient arrived about 45 minutes late, but she had not been expected at all. She had a previous commitment scheduled for today and she was excused from group, but noted that her appointment had been much quicker than she had anticipated. The patient disclosed her effort to speak with her daughter, who was visiting on business from [place]. She had been  encouraged to be assertive about an issue that had troubled her for years. Today she described what she had said and noted that it had not been taken well. The patient was saddened by her daughter's harsh response, but I quickly pointed out that being healthy and assertive after a lifetime of addiction and resentments might not be expected to go over very well. The patient and group agreed. In part two, the patient shared that being 'free' of her disease of addiction gives her choices. When she drinks, she is out of control and anything can and usually does happen. The patient does not minimize the destruction of her alcoholism to every part of her life. She continues to provided good feedback and her sobriety date is 3/6.   Family Program: Family present? No   Name of family member(s):   UDS collected: No Results:   AA/NA attended?: YesThursday and Friday  Sponsor?: No, but she is seeking a temporary sponsor   Wylan Gentzler, LCAS

## 2013-10-30 ENCOUNTER — Other Ambulatory Visit (HOSPITAL_COMMUNITY): Payer: Medicare Other | Attending: Psychiatry | Admitting: Psychology

## 2013-10-30 DIAGNOSIS — E039 Hypothyroidism, unspecified: Secondary | ICD-10-CM | POA: Insufficient documentation

## 2013-10-30 DIAGNOSIS — Z87891 Personal history of nicotine dependence: Secondary | ICD-10-CM | POA: Insufficient documentation

## 2013-10-30 DIAGNOSIS — I10 Essential (primary) hypertension: Secondary | ICD-10-CM | POA: Insufficient documentation

## 2013-10-30 DIAGNOSIS — F3289 Other specified depressive episodes: Secondary | ICD-10-CM | POA: Insufficient documentation

## 2013-10-30 DIAGNOSIS — F1020 Alcohol dependence, uncomplicated: Secondary | ICD-10-CM

## 2013-10-30 DIAGNOSIS — F4310 Post-traumatic stress disorder, unspecified: Secondary | ICD-10-CM | POA: Insufficient documentation

## 2013-10-30 DIAGNOSIS — F329 Major depressive disorder, single episode, unspecified: Secondary | ICD-10-CM | POA: Insufficient documentation

## 2013-10-31 ENCOUNTER — Other Ambulatory Visit (HOSPITAL_COMMUNITY): Payer: Medicare Other | Admitting: Psychology

## 2013-10-31 DIAGNOSIS — F4310 Post-traumatic stress disorder, unspecified: Secondary | ICD-10-CM | POA: Insufficient documentation

## 2013-10-31 DIAGNOSIS — F1020 Alcohol dependence, uncomplicated: Secondary | ICD-10-CM

## 2013-10-31 DIAGNOSIS — Z6372 Alcoholism and drug addiction in family: Secondary | ICD-10-CM | POA: Insufficient documentation

## 2013-10-31 DIAGNOSIS — F329 Major depressive disorder, single episode, unspecified: Secondary | ICD-10-CM

## 2013-10-31 NOTE — Progress Notes (Signed)
Psychiatric Assessment Adult  Patient Identification:  Mary Graves Date of Evaluation:  10/31/2013 Chief Complaint: Relapse into alcoholism from rx opiates for neck pain  Dec 2014/Jan 2015 after 3 yrs of recovery  History of Chief Complaint:    HPI Location;[facility] CD IOP This patient was seen on April 2,2015 due to clinic holiday April 3         Quality:OP CD adult assessment for CDIOP         Severity: Problem is severe         Duration:Pt began drinking ETOH age 68-had inpt treatment in 1995/her depression began at age 43 when Mary Graves was abandoned by her father and left with alcoholic/promiscuos mother who remarried 4 more times and was never without a drink or a                         man until Mary Graves had a CVA with hemiparesis 30 yrs ago          Timing:Problem is continuous and daily           Modifying factors:Adult Child of Alcoholic untreated          Review of Systems  Constitutional: Negative for fever, activity change, appetite change, fatigue and unexpected weight change.  HENT: Positive for postnasal drip and rhinorrhea. Negative for congestion and hearing loss (bilateral aids).   Eyes: Positive for itching (exopthalmos/dry eyes from Graves) and visual disturbance (wears glasses).  Respiratory: Negative for cough, choking, shortness of breath, wheezing and stridor.   Cardiovascular: Negative.  Negative for chest pain and leg swelling.  Gastrointestinal: Negative.  Negative for abdominal pain, abdominal distention and rectal pain.  Endocrine: Positive for cold intolerance and heat intolerance. Negative for polydipsia, polyphagia and polyuria.       Graves s/p I 131 now on syntghroid  Genitourinary: Positive for menstrual problem (pap regularly). Negative for dysuria, hematuria and flank pain.       Annual mammography-sister had breast ca  Musculoskeletal: Positive for neck pain (c5-7 DDD with HNP and cord compression with facet arthrosis-declined surgery and shots) and neck  stiffness.  Skin: Negative.  Negative for color change and rash.  Allergic/Immunologic: Positive for environmental allergies (seasonal).  Neurological: Negative for dizziness, tremors, seizures, syncope, facial asymmetry, speech difficulty, weakness, light-headedness, numbness and headaches.       Pcp has referred to neurologist for assessment of ?dementia due  Mother's dx and her behaviors under influence of alcohol and drugs  Hematological: Negative for adenopathy. Does not bruise/bleed easily.  Psychiatric/Behavioral: Negative for suicidal ideas, hallucinations, behavioral problems (alcoholism/isolated from chilhood trauma/depressed from childhood trauma), confusion, sleep disturbance (occasionally trouble sleeping in earluy W/D PAWS), self-injury, dysphoric mood and decreased concentration. The patient is not nervous/anxious and is not hyperactive.    Physical Exam  Nursing note and vitals reviewed. Constitutional: Mary Graves is oriented to person, place, and time. Mary Graves appears well-developed and well-nourished. No distress.  HENT:  Head: Normocephalic and atraumatic.  Right Ear: External ear normal.  Left Ear: External ear normal.  Bilateral hearing aids  Eyes: Conjunctivae and EOM are normal. Pupils are equal, round, and reactive to light. Right eye exhibits no discharge. Left eye exhibits no discharge (bilateral aids).  Neck: No JVD present. No tracheal deviation present. No thyromegaly present.  Painful stiff neck with decrease ROM  Cardiovascular: Normal rate and regular rhythm.   Pulmonary/Chest: Effort normal and breath sounds normal. No stridor. No respiratory distress. Mary Graves  has no wheezes. Mary Graves has no rales.  Abdominal: Mary Graves exhibits no distension. There is no guarding.  Genitourinary:  deferred  Musculoskeletal:  See neck  Neurological: Mary Graves is alert and oriented to person, place, and time. No cranial nerve deficit. Mary Graves exhibits abnormal muscle tone.  Skin: Skin is warm and dry. No rash  noted. Mary Graves is not diaphoretic. No erythema. There is pallor.  Psychiatric:  See mse below    Depressive Symptoms: depressed mood, feelings of worthlessness/guilt, difficulty concentrating, impaired memory, rumination over being abandoned by father age 68 with feelings of anger/rage /hurt  (Hypo) Manic Symptoms:   Elevated Mood:  NA Irritable Mood:  NA Grandiosity:  NA Distractibility:  NA Labiality of Mood:  NA Delusions:  NA Hallucinations:  NA Impulsivity:  NA Sexually Inappropriate Behavior:  NA Financial Extravagance:  NA Flight of Ideas:  NA  Anxiety Symptoms: Excessive Worry:  No Panic Symptoms:  No Agoraphobia:  NA Obsessive Compulsive: No  Symptoms: None, Specific Phobias:  No Social Anxiety:  No  Psychotic Symptoms:  Hallucinations: Negative None Delusions:  Yes typical alcoholic/addiction delusions in past-none currently Paranoia:  No   Ideas of Reference:  No  PTSD Symptoms: Ever had a traumatic exposure:  Yes Had a traumatic exposure in the last month:  No Re-experiencing: Yes Flashbacks Hypervigilance:  Yes Hyperarousal: Yes Emotional Numbness/Detachment Irritability/Anger Sleep Avoidance: Yes Decreased Interest/Participation  Traumatic Brain Injury: No NA  Past Psychiatric History: Diagnosis:alcoholism/ptsd with depression/family hx alcoholism   Hospitalizations: [facility] for alcoholism  Outpatient Care: [facility] CD IOP 2012/2015  Substance Abuse Care: as above  Self-Mutilation: na  Suicidal Attempts: na  Violent Behaviors: na   Past Medical History:   Past Medical History  Diagnosis Date  . Hypertension   . Hypothyroidism   . Alcoholism   . Pancreatitis    History of Loss of Consciousness:  Yes  Blackout drinker Seizure History:  No Cardiac History:  No Allergies:   Allergies  Allergen Reactions  . Ace Inhibitors     REACTION: cough   Current Medications:  Current Outpatient Prescriptions  Medication Sig Dispense  Refill  . folic acid (FOLVITE) 1 MG tablet Take 1 tablet (1 mg total) by mouth daily.  30 tablet  0  . levothyroxine (SYNTHROID) 175 MCG tablet Take 1 tablet (175 mcg total) by mouth daily before breakfast.  90 tablet  0  . sertraline (ZOLOFT) 100 MG tablet Take 1 tablet (100 mg total) by mouth daily.  30 tablet  0  . thiamine 100 MG tablet Take 1 tablet (100 mg total) by mouth daily.  30 tablet  0  . [DISCONTINUED] FLUoxetine (PROZAC) 40 MG capsule Take 40 mg by mouth daily.         No current facility-administered medications for this visit.    Previous Psychotropic Medications:  Medication Dose   prozac  40 mg  zoloft 100 mg                  Substance Abuse History in the last 12 months: Substance Age of 1st Use Last Use Amount Specific Type  Nicotine NA 0 0 0  Alcohol 20 10/03/2013 1 bottle wine  Cannabis 0 0 0 0  Opiates 62 ``09/2013 ? Vicodin  Cocaine 0 0 0 0  Methamphetamines 0 0 0 0  LSD 0 0 0 0  Ecstasy 0 0 0 0  Benzodiazepines 0 0 0 0  Caffeine ? ? ? ?  Inhalants 0 0  0 0  0Others: 0 0 0 0                      Medical Consequences of Substance Abuse:pancreatititis/blackouts-memory loss/withdrawal syndrome  Legal Consequences of Substance Abuse: none YET  Family Consequences of Substance Abuse: Pt lives her life very isolated from others.unable to have intimate relationships.pt is "stuck" at age 53 when her "daddy" left her  Blackouts:  Yes DT's:  No Withdrawal Symptoms:  Yes Diaphoresis Diarrhea Nausea Tremors Vomiting ketosis  Social History: Current Place of Residence[place] home Place of Birth: [place] Family Members: Mother ,2 sisters 1 older /1 younger/1 daughter and 1 grandchild on the way Marital Status:  Divorced due to her drinking Children: 1  Sons: 0  Daughters: 1 Relationships: Other than family none except sponsor Education:  Dentist Problems/Performance:none/ good Religious Beliefs/Practices: christian History of  Abuse: emotional (abandoned by father /neglected by alcoholic mother) Occupational Experiences;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 Legal History: none Hobbies/Interests: oriental art  Family History:   Family History  Problem Relation Age of Onset  . Cancer Other     Breast cancer  . Heart disease Neg Hx   . Hyperlipidemia Neg Hx   . Hypertension Neg Hx   . Stroke Neg Hx   . Alcohol abuse Mother     Mental Status Examination/Evaluation: Objective:  Appearance: Neat  Eye Contact::  Good  Speech:  Clear and Coherent  Volume:  Normal  Mood:  Variable  Affect:  congruent  Thought Process:  Logical  Orientation:  Full (Time, Place, and Person)  Thought Content:  WDL and Rumination  Suicidal Thoughts:  No  Homicidal Thoughts:  No  Judgement:  Fair  Insight:  Fair  Psychomotor Activity:  Normal  Akathisia:  NA  Handed:  Right  AIMS (if indicated):  na  Assets:  Agricultural consultant Housing Resilience    Laboratory/X-Ray Psychological Evaluation(s)   See Results Review  See [facility] intake   Assessment:  See below  AXIS I alcohol dependence syndrome;PTSD;Depressive DO NOS  AXIS II Deferred  AXIS III Past Medical History  Diagnosis Date  . Hypertension   . Hypothyroidism   . Alcoholism   . Pancreatitis      AXIS IV problems with primary support group  AXIS V 41-50 serious symptoms   Treatment Plan/Recommendations:  Plan of Care: [facility] IOP Program  Laboratory:  FU with PCP  Psychotherapy: CD IOP [facility] and Group  Medications: Zoloft 100 mg  Routine PRN Medications:  No  Consultations: ACOA counseling  Safety Concerns:  None now  Other:  NA    Dara Hoyer [facility] [place] CD IOP 4/2/20152:11 PM

## 2013-10-31 NOTE — Progress Notes (Unsigned)
    Daily Group Progress Note  Program: CD-IOP   Group Time: 1:00-2:30pm  Participation Level: Active  Behavioral Response: Appropriate  Type of Therapy: Process Group  Topic: Group members checked in by sharing their names and sobriety dates.  Counselor led the group in 5 minutes of HeartMath relaxation.  Group members then shared and discussed issues related to early recovery, including managing stress, missing the "old life," and the importance of identifying and expressing feelings.       Group Time: 2:45-4:00pm  Participation Level: Active  Behavioral Response: Appropriate  Type of Therapy: Psycho-education Group  Topic: Counselor led a discussion on assertiveness.  Group members reviewed a handout outlining the differences between passiveness, aggressiveness, and assertiveness.  They discussed their typical patterns of communication, the origins of those patterns, and the drawbacks to being either passive or aggressive.  Counselor then led 4 roleplay exercises in which group members acted out both their typical communication style and practiced assertiveness skills.   Summary: Patient's sobriety date remains 3/6.  Patient reported that she had met with her sponsor today for the first time and was looking forward to working with her.  She also reported that she was learning from her sponsor to identify places where she feels safe.  During the process group, she connected with several other group members.  During the psychoeducation group, she shared that she was raised to have a passive communication style, and was working to become more assertive.   Family Program: Family present? No   Name of family member(s): n/a  UDS collected: Yes Results: not yet available  AA/NA attended?: YesTuesday and Wednesday  Sponsor?: Yes   [facility]

## 2013-11-01 ENCOUNTER — Ambulatory Visit: Payer: Self-pay | Admitting: Neurology

## 2013-11-01 ENCOUNTER — Other Ambulatory Visit (HOSPITAL_COMMUNITY): Payer: Medicare Other

## 2013-11-04 ENCOUNTER — Other Ambulatory Visit (HOSPITAL_COMMUNITY): Payer: Medicare Other | Admitting: Psychology

## 2013-11-05 ENCOUNTER — Encounter (HOSPITAL_COMMUNITY): Payer: Self-pay | Admitting: Psychology

## 2013-11-05 ENCOUNTER — Encounter: Payer: Self-pay | Admitting: Neurology

## 2013-11-05 ENCOUNTER — Ambulatory Visit (INDEPENDENT_AMBULATORY_CARE_PROVIDER_SITE_OTHER): Payer: Medicare Other | Admitting: Neurology

## 2013-11-05 VITALS — BP 126/62 | HR 88 | Resp 14 | Ht 66.5 in | Wt 131.0 lb

## 2013-11-05 DIAGNOSIS — R413 Other amnesia: Secondary | ICD-10-CM

## 2013-11-05 DIAGNOSIS — F4310 Post-traumatic stress disorder, unspecified: Secondary | ICD-10-CM

## 2013-11-05 DIAGNOSIS — F1020 Alcohol dependence, uncomplicated: Secondary | ICD-10-CM

## 2013-11-05 NOTE — Progress Notes (Signed)
    Daily Group Progress Note  Program: CD-IOP   Group Time: 1-2:30 pm  Participation Level: Active  Behavioral Response: Appropriate and Sharing  Type of Therapy: Process Group  Topic: Group Process: the first part of group was spent in process. The group was much smaller with a number of members still out from the holiday weekend. Members shared about the past weekend and any concerns or issues that may have appeared. At my urging, one member disclosed and inappropriate relationship with a fellow group members. It had only begun early last week and consisted of talking and texting. Despite just a few days, it accelerated quickly and spun out of control, when the member's husband found the text messages. The patient explained his part of this brief disaster and expressed his remorse and apologized to his fellow group members. It was an intense session with members very attentive, but non-judgmental of their fellow group members. The member was applauded for his disclosures and a good discussion ensued about handling 'addictive' thoughts and its accompanying mentality.  Group Time: 2:45- 4pm  Participation Level: Active  Behavioral Response: Sharing  Type of Therapy: Psycho-education Group  Topic: Taking a Daily Inventory. The second half of group was spent in a psycho-ed. Members were provided a handout with a list of personality characteristics common of active addiction and self-will run wild. This list of characteristics was contrasted with a list of those more typical of sobriety and characteristics of one's Higher Power. Members discussed the characteristics and identified those most common when they are drinking or drugging. This proved particularly fitting considering the first part of group spent discussing self-will gone wild. The session was very open and members very honest about themselves. Emphasized in this session was the importance of identifying or picking up on this type  of thinking and, most important, talking to someone and addressing them when they first appear.   Summary: The patient reported she had picked up her 69 day key chain this morning and waved it in the air. This drew a round of applause from her fellow group members. She reported that her intention to to attend 90 meetings in 90 days. She had a busy weekend and attended church yesterday. She expressed frustration over her mother's behavior yesterday afternoon when she visited her mother and step-father for lunch. Her mother is very rude and unkind at times and the patient gets very irritated with her. I pointed out that Charissa Bash gets mad at her mother despite the fact that her mother suffers from dementia and Alzheimers and is not really in control of her emotional self. She failed to accept this notion and continued to complain about her 69 yo mother. In the second half of group, the patient identified the 'victim mentality' as being the most typical characteristic of her addictive self. She admitted this always comes out just prior to relapse. The patient continues to make good progress in her recovery and is very open and honest about her character defects here in group. Her sobriety date is 3/6.    Family Program: Family present? No   Name of family member(s):   UDS collected: No Results:   AA/NA attended?: YesMonday, Friday and Saturday  Sponsor?: Yes   Amarissa Koerner, LCAS
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
## 2013-11-05 NOTE — Patient Instructions (Signed)
I don't appreciate any evidence of Alzheimer's or other dementia on my exam.  We will check a vitamin B12 level.  Continue folic acid and thiamine.  If needed, we can recheck in one year or sooner if any problems.  Continue abstinence.

## 2013-11-06 ENCOUNTER — Telehealth: Payer: Self-pay | Admitting: Neurology

## 2013-11-06 ENCOUNTER — Other Ambulatory Visit (HOSPITAL_COMMUNITY): Payer: Medicare Other | Admitting: Psychology

## 2013-11-06 DIAGNOSIS — F1020 Alcohol dependence, uncomplicated: Secondary | ICD-10-CM

## 2013-11-06 LAB — VITAMIN B12: VITAMIN B 12: 926 pg/mL — AB (ref 211–911)

## 2013-11-06 NOTE — Telephone Encounter (Signed)
Left a message per patient request on voicemail  Labs are not back yet will call her  when resulted to Dr Tomi Likens.

## 2013-11-06 NOTE — Telephone Encounter (Signed)
Pt called requesting tot speak to a nurse regarding her lab results. She had labs done yesterday. Pt states that it is okay to leave results on Voice Mail.

## 2013-11-06 NOTE — Telephone Encounter (Signed)
Pt called requesting the results to her lab work from yesterday. She stated that if she does not answer the call To leave the results on her voicemail.

## 2013-11-07 LAB — METHYLMALONIC ACID, SERUM: Methylmalonic Acid, Quant: 0.25 umol/L (ref <0.40)

## 2013-11-07 NOTE — Progress Notes (Unsigned)
    Daily Group Progress Note  Program: CD-IOP   Group Time: 1:00-2:30pm  Participation Level: Active  Behavioral Response: Appropriate  Type of Therapy: Process Group  Topic: The first hour of group was spent in process.  Counselor led a check-in and five minutes of relaxation.  Group members shared about their progress and issues they were experiencing in early recovery, including managing symptoms of PAWS.  After a presentation by a hospital Chaplain, the third hour of group was also spent in process.  A new group member shared about herself and received encouragement and feedback from other members.     Group Time: 2:45-4:00pm  Participation Level: Active  Behavioral Response: Appropriate and Sharing  Type of Therapy: Psycho-education Group  Topic: The second hour of group was spent with [place], who led a discussion on the topic of forgiveness.  Group members shared their beliefs and ideas about forgiveness and discussed the need to forgive others and themselves. They also shared about the difficulties they have with forgiveness, particularly with forgiving themselves for their actions during active addiction.   Summary: Patient's sobriety date remains 3/6.  She shared she had an appointment with a neurologist yesterday, and had found no problems with her memory or cognitive functioning.  She reflected on her reaction to her doctors' knowledge about alcoholism, and group members provided her with good feedback on seeing them in a more positive light.  During the discussion on forgiveness, she provided her ideas about forgiveness and shared about her struggle to forgive family members, especially her mother.   Family Program: Family present? No   Name of family member(s): n/a  UDS collected: No Results: n/a  AA/NA attended?: YesTuesday and Wednesday  Sponsor?: Yes   [facility]

## 2013-11-08 ENCOUNTER — Other Ambulatory Visit (HOSPITAL_COMMUNITY): Payer: Medicare Other | Admitting: Psychology

## 2013-11-08 ENCOUNTER — Telehealth: Payer: Self-pay | Admitting: *Deleted

## 2013-11-08 DIAGNOSIS — F331 Major depressive disorder, recurrent, moderate: Secondary | ICD-10-CM

## 2013-11-08 DIAGNOSIS — F10239 Alcohol dependence with withdrawal, unspecified: Secondary | ICD-10-CM

## 2013-11-08 NOTE — Telephone Encounter (Signed)
Patient is aware that lab work is normal.

## 2013-11-08 NOTE — Telephone Encounter (Signed)
Message copied by Claudie Revering on Fri Nov 08, 2013  8:31 AM ------      Message from: JAFFE, ADAM R      Created: Fri Nov 08, 2013  6:28 AM       B12 level okay.      ----- Message -----         From: [facility]         Sent: 11/07/2013   1:39 PM           To: Dudley Major, DO                   ------

## 2013-11-10 ENCOUNTER — Encounter (HOSPITAL_COMMUNITY): Payer: Self-pay

## 2013-11-10 NOTE — Progress Notes (Unsigned)
    Daily Group Progress Note  Program: CD-IOP   Group Time: 1-2:30 pm  Participation Level: Active  Behavioral Response: Appropriate and Sharing  Type of Therapy: Psycho-education Group  Topic: Psycho-Ed: after completing the check-in, the first part of group was spent in a psycho-ed that included a review of PAWS and a review of the relapse process. A new group member was present and he was open about his struggles. He had only been sober for less than 2 weeks and he received excellent feedback and support from his fellow group members. The session proved informative for new members and a good review for current members. During this time, the program director, CK, met with 2 new group members and members discharging or wanting to discuss medications.  Group Time: 2:45- 4pm  Participation Level: Active  Behavioral Response: Sharing  Type of Therapy: Process Group  Topic: Process/Graduation: the second half of group included process, where members shared about current issues and concerns. New struggles or challenges were also shared by the newer group members. As the session came to an end, a graduation ceremony was held to honor a member who was moving on after successfully completing the program. There were kind words of thanks and hope shared by all and it proved to be a touching ending to the session and the week.   Summary: The patient reported she was doing well. She noted that she had come to realize that she is having a reaction to the 'Niacin" she is prescribed. She has spoken with her PCP and will eliminate this medication from her protocol. The patient reported she had met with her new sponsor and discussed what her sponsor expects of her. She was pleased to gain clarity about what will be required of her. The patient seemed distant and withdrawn at one point in group, but she shared kind words to the graduating member and remains sober with a sobriety date of 3/6. A drug test  was collected today from this patient.   Family Program: Family present? No   Name of family member(s):   UDS collected: Yes Results: not back from lab  AA/NA attended?: YesThursday and Friday  Sponsor?: Yes   [facility]

## 2013-11-11 ENCOUNTER — Other Ambulatory Visit (HOSPITAL_COMMUNITY): Payer: Medicare Other | Admitting: Psychology

## 2013-11-11 ENCOUNTER — Encounter (HOSPITAL_COMMUNITY): Payer: Self-pay | Admitting: Psychology

## 2013-11-11 DIAGNOSIS — F1020 Alcohol dependence, uncomplicated: Secondary | ICD-10-CM

## 2013-11-11 DIAGNOSIS — F329 Major depressive disorder, single episode, unspecified: Secondary | ICD-10-CM

## 2013-11-12 ENCOUNTER — Encounter (HOSPITAL_COMMUNITY): Payer: Self-pay | Admitting: Psychology

## 2013-11-12 NOTE — Progress Notes (Signed)
    Daily Group Progress Note  Program: CD-IOP   Group Time: 1-2:30 pm  Participation Level: Active  Behavioral Response: Appropriate and Sharing  Type of Therapy: Process Group  Topic: Process: the first part off group was spent in process. Members shared about issues and concerns in early recovery. During group today the Investment banker, operational met with new group members as well as current members who wanted to discuss medication issues.   Group Time: 2:45- 4pm  Participation Level: Active  Behavioral Response: Sharing  Type of Therapy: Psycho-education Group  Topic: Communication/Refusal Skills: the second half of group was spent discussing various ways to respond to offers to use. Members were provided a handout on "Communication" and it included different scenarios where individuals are offered or pressured to drink or drug. A discussion followed with members responding to these scenarios and offering their own thoughts on how to respond. Members also volunteered to role play situations where they might be tempted. The importance of practicing and articulating various ways to refuse offers was emphasized.   Summary: The patient reported she was feeling better today. She had gone to the gym yesterday and is regaining her faculties slowly after her 10+ day binge and subsequent hospitalization and detox. She admitted her recovery must include being more open and honest with others and not distancing herself from people. Her tendency to isolate, based on a low self-esteem, has fueled her alcoholism for many years. The patient met with the program director at this point in the session and didn't return until part two of group. She reported she was not embarrassed or afraid to tell someone she was in recovery. At 68 yo there is nothing to apologize for. The patient noted that the program director had encouraged her to study ACOA documentation and that it would help address part of her inner self.  She continues to make good progress and appears to be as open and transparent as she is capable of being at this time. The patient's sobriety date is 3/6.   Family Program: Family present? No   Name of family member(s):   UDS collected: No Results:  AA/NA attended?: YesWednesday and Thursday  Sponsor?: Yes   Adaria Hole, LCAS

## 2013-11-13 ENCOUNTER — Encounter (HOSPITAL_COMMUNITY): Payer: Self-pay | Admitting: Psychology

## 2013-11-13 ENCOUNTER — Other Ambulatory Visit (HOSPITAL_COMMUNITY): Payer: Medicare Other | Admitting: Psychology

## 2013-11-13 DIAGNOSIS — F1020 Alcohol dependence, uncomplicated: Secondary | ICD-10-CM

## 2013-11-13 NOTE — Progress Notes (Signed)
    Daily Group Progress Note  Program: CD-IOP   Group Time: 1-2:30 pm  Participation Level: Active  Behavioral Response: Appropriate and Sharing  Type of Therapy: Process Group  Topic: Group Process: the first part of group was spent in process.  A new group member was present and he introduced himself. Members shared about the past weekend and any issues, concerns or challenges that had presented themselves. There was good feedback among members and support for the new group member. Drug tests were collected from a number of members.  Group Time: 2:45- 4pm  Participation Level: Active  Behavioral Response: Sharing  Type of Therapy: Psycho-education Group  Topic: Core Beliefs: The second half of group was spent in a psycho-ed. Members were provided a handout on Core Beliefs and asked to complete the questions. Members identified some of their core beliefs and they were extremely negative. They included: "I am not lovable, I am a loser, and I am unworthy". The development of core beliefs occurs in childhood and then we take them with [place] and they color how interaction and perceptions with the world as an adult. Members talked about things that had occurred in their youth and how those perceptions have affected them today. There were some very powerful and emotional disclosures among group members and the session provided them with good insight into these distorted core beliefs. The next session will provide members with the skills and tools to challenge these negative views.   Summary: The patient arrived a little late for group, but she had left a message stating she might be late. She explained that she had taken her mother to the doctor and the appointment was at 9:30. She hadn't even been seen at 11 am and it was very frustrating. The patient reminded [place] that her mother is in a wheelchair and suffers from Devol. She is a very difficult lady even without her physical limitations.  The patient reported she had enjoyed the weekend. She had attended 2 AA meetings, worked in her small garden and took a nap each afternoon. In the second half of group, the patient admitted that her negative core belief says, "I don't deserve to be loved". She agreed that this core belief has contributed to her being distant and emotionally unavailable to others. As a result, they never get to know her, eventually leave, and fulfill her negative core belief. The patient made some good comments and agreed that this issue is one of her biggest. She responded well to this intervention and her sobriety date remains 3/6.   Family Program: Family present? No   Name of family member(s):   UDS collected: No Results:   AA/NA attended?: YesMonday, Saturday and Sunday  Sponsor?: Yes   Stevie Charter, LCAS

## 2013-11-14 NOTE — Progress Notes (Unsigned)
    Daily Group Progress Note  Program: CD-IOP   Group Time: 1:00-2:30pm  Participation Level: Active  Behavioral Response: Appropriate  Type of Therapy: Process Group  Topic: The first half of group was spent in process.  Group members checked in by sharing their names and sobriety dates and then counselor led a 15-minute guided relaxation exercise.  Group members then shared about how they were doing in early recovery, and discussed issues such as PAWS symptoms, managing cravings, and identifying triggers.  A former group member stopped by and shared briefly about her experience with recovery.     Group Time: 2:45-4:00pm  Participation Level: Active  Behavioral Response: Appropriate  Type of Therapy: Psycho-education Group  Topic: Counselor continued the discussion on core beliefs from the previous group session.  She presented the concept of automatic thoughts and explained how they can be used to better understand core beliefs.  Group members provided examples of automatic thoughts and discussed how they reflected their core beliefs about themselves, others, and the world.  They also discussed how they could use alternate thoughts to challenge their automatic thoughts when they arise.   Summary: Patient's sobriety date remains 3/6.  She reported that she had attended meetings daily in an effort to reach 90 in 90.  She reported that she had been feeling busy between meetings, IOP sessions, and volunteer work.  She shared that she had been with her mother more recently, and struggled to handle her mother's negative attitude.  During the psychoeducation group, she shared about how unhealthy core beliefs held her back in life.   Family Program: Family present? No   Name of family member(s): n/a  UDS collected: Yes Results: not yet available  AA/NA attended?: YesTuesday and Wednesday  Sponsor?: Yes   [facility]

## 2013-11-15 ENCOUNTER — Other Ambulatory Visit (HOSPITAL_COMMUNITY): Payer: Medicare Other | Admitting: Psychology

## 2013-11-15 DIAGNOSIS — F1020 Alcohol dependence, uncomplicated: Secondary | ICD-10-CM

## 2013-11-15 DIAGNOSIS — F329 Major depressive disorder, single episode, unspecified: Secondary | ICD-10-CM

## 2013-11-17 ENCOUNTER — Encounter (HOSPITAL_COMMUNITY): Payer: Self-pay | Admitting: Psychology

## 2013-11-17 NOTE — Progress Notes (Signed)
    Daily Group Progress Note  Program: CD-IOP   Group Time: 1-2:30 pm  Participation Level: Active  Behavioral Response: Appropriate and Sharing  Type of Therapy: Process Group  Topic: Group Process: the first part of group was spent in process. Members shared about current issues and concerns. The newer group members asked questions of the members with longer sobriety. During the session today, new members were meeting with the Investment banker, operational. In addition, he also met with current members about medication questions/changes as well as a discharge. There was good discussion and disclosure among the members.  Group Time: 2:45- 4pm  Participation Level: Active  Behavioral Response: Sharing  Type of Therapy: Psycho-education Group  Topic: Addiction and Dopamine/Graduation: the second half of group was spent watching a slide show on addiction and the chemical changes that occur in the brain. The group found the pet scan slides especially interesting. Even after 100 days of sobriety, the dopamine in the subject's brain remained very depleted. As the session neared the end, a graduation ceremony was held for a member who was successfully completing the program and her treatment today. There were kind words of hope and expressions of gratitude and it proved to be a very touching ceremony.   Summary: The patient reported she was doing well. She provided excellent feedback to one of the newer group members and encouraged him to be patient and just keep going to meetings. In the second half of group, the patient comments on PAWS and how she had taken a long time to get back to being herself after being in detox. It was weeks before she could speak properly, her brain had been so damaged by the 10-day alcohol binge she had engaged in. the patient made some good comments and continues to make excellent progress in her recovery. Her sobriety date is 3/6.   Family Program: Family present? No    Name of family member(s):   UDS collected: No Results:   AA/NA attended?: YesWednesday, Thursday and Friday  Sponsor?: Yes   Sugar Vanzandt, LCAS

## 2013-11-18 ENCOUNTER — Other Ambulatory Visit (HOSPITAL_COMMUNITY): Payer: Medicare Other | Admitting: Psychology

## 2013-11-18 DIAGNOSIS — F1020 Alcohol dependence, uncomplicated: Secondary | ICD-10-CM

## 2013-11-18 DIAGNOSIS — F329 Major depressive disorder, single episode, unspecified: Secondary | ICD-10-CM

## 2013-11-19 ENCOUNTER — Encounter (HOSPITAL_COMMUNITY): Payer: Self-pay | Admitting: Psychology

## 2013-11-19 NOTE — Progress Notes (Signed)
    Daily Group Progress Note  Program: CD-IOP   Group Time: 1-2:30 pm  Participation Level: Active  Behavioral Response: Appropriate and Sharing  Type of Therapy: Psycho-education Group  Topic: Psycho-Ed: the first part of group was spent in a psycho-ed session with a [facility] pharmacist, EP. She offered a presentation on the various categories of drugs. The pharmacist explained the effects of the drugs on one's brain and the subsequent withdrawal symptoms that are experienced in early recovery. There were many questions from group members and the session was very lively and proved to be a very informative session for the "laymen's" in the program. The group agreed that the presentation was very helpful.  Group Time: 2:45- 4pm  Participation Level: Active  Behavioral Response: Sharing  Type of Therapy: Process Group  Topic:Group Process: the second part of group was spent in process. Members shared about the past weekend and concerns and challenges that presented themselves. Members had varied experiences this weekend, but most had been active, but challenged. A new member was present today and she introduced herself to the group members. The group provided good feedback to the new member and her experiences were validated. There was good exchange and sharing among members.    Summary: The patient checked-in with a sobriety date of 3/6. She had had a good weekend. The patient was very attentive to the pharmacist's presentation and asked good questions, including an explanation of Antabuse. She also invited a discussion on Naltrexone. In process, the patient reported she had had a great time at the reception on Friday. It was fun to be around bright interesting people and it didn't involve AA. The patient expressed frustration with her elderly mother who suffers from Alzheimer's and the complications and stressors of this responsibility. The patient continues to make good progress and is  attending daily NA/AA meetings. She responded well to this intervention.  Family Program: Family present? No   Name of family member(s):   UDS collected: No Results:   AA/NA attended?: YesMonday, Saturday and Sunday  Sponsor?: Yes   Gaudencio Chesnut, LCAS

## 2013-11-20 ENCOUNTER — Other Ambulatory Visit (HOSPITAL_COMMUNITY): Payer: Medicare Other | Admitting: Psychology

## 2013-11-20 DIAGNOSIS — F1020 Alcohol dependence, uncomplicated: Secondary | ICD-10-CM

## 2013-11-21 NOTE — Progress Notes (Unsigned)
    Daily Group Progress Note  Program: CD-IOP   Group Time: 1:00-2:30pm  Participation Level: Active  Behavioral Response: Appropriate  Type of Therapy: Process Group  Topic: The first half of group was spent with a group speaker, who shared his recovery story with the group.  He explained the effects that drug use had on his life for many years, and then described his process of recovery and the aspects of recovery that were most important to him.  The counselor also encouraged him to share about the importance of sponsorship, which he did. The group was very appreciative of his story and many group members reported being very inspired by him.     Group Time: 2:45-4:00pm  Participation Level: Active  Behavioral Response: Appropriate  Type of Therapy: Psycho-education Group  Topic: The second half of group was spent discussing the story shared by the guest speaker.  Group members shared their reactions to his story and spent time discussing the reasons that having a sponsor was important to them.  They shared about the various requirements their sponsors have for them and discussed the process of choosing a sponsor.  The final half hour of group was spent in process, and several group members shared about how they were doing in their recovery.   Summary: Patient's sobriety date remains 3/6.  During the discussion on the guest speaker's presentation, she shared that she connected with the speaker's denial and resistance early on in recovery.  She described her own thinking and behavior during past recovery times as "insanity."  She also shared about her relationship with her past sponsor and how it compares to her current sponsor.   Family Program: Family present? No   Name of family member(s): n/a  UDS collected: No Results: n/a  AA/NA attended?: YesTuesday and Wednesday  Sponsor?: Yes   [facility]

## 2013-11-22 ENCOUNTER — Other Ambulatory Visit (HOSPITAL_COMMUNITY): Payer: Medicare Other | Admitting: Psychology

## 2013-11-22 DIAGNOSIS — F329 Major depressive disorder, single episode, unspecified: Secondary | ICD-10-CM

## 2013-11-22 DIAGNOSIS — F1020 Alcohol dependence, uncomplicated: Secondary | ICD-10-CM

## 2013-11-24 ENCOUNTER — Encounter (HOSPITAL_COMMUNITY): Payer: Self-pay | Admitting: Psychology

## 2013-11-24 NOTE — Progress Notes (Signed)
    Daily Group Progress Note  Program: CD-IOP   Group Time: 1-2:30 pm  Participation Level: Active  Behavioral Response: Appropriate and Sharing  Type of Therapy: Process Group  Topic: Process/Guided Relaxation: the first part of group was spent in process. Members shared about current issues and concerns. Two new group members were present and they introduced themselves during this part of group. They received good feedback and support from their new group members.  Group Time: 2:45- 4pm  Participation Level: Minimal  Behavioral Response: Appropriate  Type of Therapy: Psycho-education Group  Topic: Wheel of Life: the second half of group was spent in a psycho-ed using the Wheel of Life handout. Group members were provided with a handout with a circle in the center. The circle included 8 segments, each of which represented different important elements of life.  The handout required members to identify how satisfied or fulfilled they are in each category. The exercise allows the participant to see how his/her wheel looks relative to balance as well as challenging the person to describe their wheel to their fellow group members. The session ended with a graduation ceremony and farewell to [facility], the counseling intern who has been here in the CD-IOP since August. It was an emotional, but kindhearted farewell.   Summary: The patient reported she was doing well. She had attended meetings and continues to work closely with her sponsor. She applauded one of the group members and noted that although he doesn't speak often, when he does, it is always something insightful and helpful. To the new group members, the patient offered comfort and support and encouraged them they would feel better and more comfortable in the group very soon. She reminded them they had all gone through this same experience. The patient reported she continues to pay attention to her 'critical voice' and is working with her  sponsor to address this part of her that looks at others in a critical manner. The patient did not draw her wheel as we ran out of time, but she responded well to this intervention. She continues to make good progress in her recovery and made some excellent comments. Her sobriety date remains 3/6.    Family Program: Family present? No   Name of family member(s):   UDS collected: No Results:   AA/NA attended?: YesThursday and Friday  Sponsor?: Yes   Ladan Vanderzanden, LCAS

## 2013-11-25 ENCOUNTER — Other Ambulatory Visit (HOSPITAL_COMMUNITY): Payer: Medicare Other | Admitting: Psychology

## 2013-11-25 DIAGNOSIS — F329 Major depressive disorder, single episode, unspecified: Secondary | ICD-10-CM

## 2013-11-25 DIAGNOSIS — F1020 Alcohol dependence, uncomplicated: Secondary | ICD-10-CM

## 2013-11-26 ENCOUNTER — Encounter (HOSPITAL_COMMUNITY): Payer: Self-pay | Admitting: Psychology

## 2013-11-26 NOTE — Progress Notes (Unsigned)
[facility] CD-IOP: Individual Therapy Session. Met with the patient this morning as scheduled. She had come directly from the NA meeting she attends daily. The patient reported she is doing well. Between her 12-step meetings and the [facility], coming to this program and addressing her spiritual development, she feels like she is becoming healthier and more balanced in her life. She noted that the Wheel of Life she had completed for group on Monday was pretty steady with almost everything around a "7".  When she stated she was going to visit with her mother later this morning, I expressed concerns about the fact that 'Manus Gunning' pushes her daughter's buttons. The patient reported that she is developing much better boundaries and isn't letting her mother get to her like she used to. She is feeling a little more compassion for herself and her mother. We discussed her treatment goals. They include sobriety, building support for her recovery, and becoming more at peace with herself and her inner self. The patient has done very well relative to the first two with a sobriety date of 3/6 and a new sponsor. She has also increased some of her meetings and is now enjoying the NA meeting at 7:30 am on weekdays. The patient reported she is feeling much better about herself and is more content and at peace than in the past. She is also much more committed to her sobriety this time around. The patient continues to make excellent progress in her recovery and contributes significantly to the group process.  She has attended 16 sessions and is two-thirds the way through the program. We will continue to follow closely in the days and weeks ahead.         Lezly Rumpf, LCAS

## 2013-11-26 NOTE — Progress Notes (Signed)
    Daily Group Progress Note  Program: CD-IOP   Group Time: 1-2:30 pm  Participation Level: Active  Behavioral Response: Appropriate and Sharing  Type of Therapy: Process Group  Topic: Group Process: the first part of group was spent in process. Members shared about the past weekend and any concerns or obstacles to their ongoing sobriety. On member admitted she had taken 2 pills last Thursday evening. A friend had been over and provided them to her because she was so stressed out and anxious. This relapse was discussed and the seriousness of taking other people's medications reiterated. There was good disclosure and feedback among group members.  Group Time: 2:45- 4pm  Participation Level: Active  Behavioral Response: Appropriate and Sharing  Type of Therapy: Psycho-education Group  Topic: Relapse: the Four Most common Conditions. The second part of group was spent in a psycho-ed on relapse. A handout was provided with the four most common conditions or circumstances that lead to relapse. Members were asked to shared examples of all four of these conditions. Almost everyone had experienced each of the conditions in some way. Identifying strategies and making plans to address these conditions was discussed at length. The session was lively with good feedback among members.   Summary: The patient reported she had had a good weekend. She provided good feedback to a relatively new group member who was asking about Step 4. The patient explained that she had addressed Step 4 like she had Catholicism. She had to confess and by doing so, the process allowed her to forgive and let go. The patient also confirmed what another new member asked about sleep. This patient admitted she had many problems sleeping in early recovery, but as time passes, the woman will experience improved sleep. The patient continues to make good progress and reported she had attended 3 meetings since Friday. She also  attended her church service on Sunday. Continues to make good progress and responded well to this intervention. A drug test was collected today and her sobriety date remains 3/6.     Family Program: Family present? No   Name of family member(s):   UDS collected: Yes Results: not back yet  AA/NA attended?: YesMonday, Saturday and Sunday  Sponsor?: Yes   Chelsey Redondo, LCAS

## 2013-11-27 ENCOUNTER — Other Ambulatory Visit (HOSPITAL_COMMUNITY): Payer: Medicare Other | Admitting: Psychology

## 2013-11-29 ENCOUNTER — Other Ambulatory Visit (HOSPITAL_COMMUNITY): Payer: Medicare Other | Attending: Psychiatry | Admitting: Psychology

## 2013-11-29 DIAGNOSIS — F3289 Other specified depressive episodes: Secondary | ICD-10-CM | POA: Insufficient documentation

## 2013-11-29 DIAGNOSIS — Z87891 Personal history of nicotine dependence: Secondary | ICD-10-CM | POA: Insufficient documentation

## 2013-11-29 DIAGNOSIS — I10 Essential (primary) hypertension: Secondary | ICD-10-CM | POA: Insufficient documentation

## 2013-11-29 DIAGNOSIS — E039 Hypothyroidism, unspecified: Secondary | ICD-10-CM | POA: Insufficient documentation

## 2013-11-29 DIAGNOSIS — F4310 Post-traumatic stress disorder, unspecified: Secondary | ICD-10-CM | POA: Insufficient documentation

## 2013-11-29 DIAGNOSIS — F329 Major depressive disorder, single episode, unspecified: Secondary | ICD-10-CM

## 2013-11-29 DIAGNOSIS — F1020 Alcohol dependence, uncomplicated: Secondary | ICD-10-CM

## 2013-12-02 ENCOUNTER — Encounter (HOSPITAL_COMMUNITY): Payer: Self-pay | Admitting: Psychology

## 2013-12-02 ENCOUNTER — Other Ambulatory Visit (HOSPITAL_COMMUNITY): Payer: Medicare Other | Admitting: Psychology

## 2013-12-02 DIAGNOSIS — F329 Major depressive disorder, single episode, unspecified: Secondary | ICD-10-CM

## 2013-12-02 DIAGNOSIS — F1020 Alcohol dependence, uncomplicated: Secondary | ICD-10-CM

## 2013-12-02 NOTE — Progress Notes (Signed)
    Daily Group Progress Note  Program: CD-IOP   Group Time: 1-2:30 pm  Participation Level: Active  Behavioral Response: Appropriate and Sharing  Type of Therapy: Process Group  Topic: Group process. The first part of group was spent in process. Members shared about current issues and concerns. A new member 'introduced' herself and gave the group a little about her history and what she needs now. Another member shared about his concerns about the upcoming weekend. Another member arrived late and disclosed very personal things about the night before and, indirectly, her childhood. The session was very personal with members becoming very vulnerable through their sharing.   Group Time: 2:45- 4pm  Participation Level: Active  Behavioral Response: Appropriate and Sharing  Type of Therapy: Psycho-education Group  Topic: Psycho-Ed: the second half of group was spent in an educational piece on "12-Step Slogans". A handout was provided to group members identifying over 98 AA slogans. The group read over the handout and discussed each slogan. Individual members were chosen and they explained, to the best of their ability, what the slogan meant. The session was informative and funny with members sharing their interpretations, many of which were not very accurate. As the session came to an end, members observed and celebrated a graduation. There were kind and hopeful words shared as the members said good-bye.   Summary: The patient reported she was doing well. She validated her fellow group member who had met with me and his wife. She felt he had handled things well. The patient emphasized the importance of 'asking for what we need'.  The patient provided good feedback to a new group member who had asked about securing a sponsor. In part two of group, the patient was able to explain many of the recovery "slogans" and was a good and steady source of understanding to the new group members. She  continues to make good progress and her sobriety date remains 3/6.  Family Program: Family present? No   Name of family member(s):   UDS collected: No Results:   AA/NA attended?: YesThursday and Friday  Sponsor?: Yes   Vaanya Shambaugh, LCAS

## 2013-12-03 ENCOUNTER — Encounter (HOSPITAL_COMMUNITY): Payer: Self-pay | Admitting: Psychology

## 2013-12-03 NOTE — Progress Notes (Unsigned)
[facility] CD-IOP: Individual therapy session. The patient appeared as scheduled at 8:45 at the completion of her NA meeting. She reported the meeting had gone well and is a really good way to begin her day. She wondered about her graduation date and I suggested she will complete the program next Friday. She was agreeable to this and noted she will be going to the beach on Sunday with her sisters to celebrate their birthdays. We reviewed her goals of treatment. They include sobriety, which she has done very well with a sobriety date of 3/16. She is active and engaged in the [employer] and has a sponsor. She was scheduled to meet with the sponsor after this session. The patient's 3rd treatment goal was to become more open and assertive in her communication. She has struggled with relationships and doesn't always say things in ways that most accurately express her feelings. The patient has made good progress in this goal of treatment and is very pleased that her relationship with her daughter, [name], seems to be growing in a healthy and loving manner. The patient is particularly pleased since her daughter is pregnant and is due to have her first child in late November. The patient hopes to travel out to [place] sometime after the fist of the year to help daughter and grandchild. We agreed to meet on Friday, the 15th, before group to review her discharge plan. We will continue to follow closely.         Vaughn Frieze, LCAS

## 2013-12-03 NOTE — Progress Notes (Unsigned)
    Daily Group Progress Note  Program: {CHL AMB BH IOP/CDIOP Program Type:21022744}   Group Time: ***  Participation Level: {CHL AMB BH Group Participation:21022742}  Behavioral Response: {CHL AMB BH Group Behavior:21022743}  Type of Therapy: {CHL AMB BH Type of Therapy:21022741}  Topic: ***     Group Time: ***  Participation Level: {CHL AMB BH Group Participation:21022742}  Behavioral Response: {CHL AMB BH Group Behavior:21022743}  Type of Therapy: {CHL AMB BH Type of Therapy:21022741}  Topic: ***   Summary: ***   Family Program: Family present? {BHH YES OR NO:22294}   Name of family member(s): ***  UDS collected: {BHH YES OR NO:22294} Results: {Findings; urine drug screen:60936}  AA/NA attended?: {BHH YES OR NO:22294}{DAYS OF NN:2940888  Sponsor?: {BHH YES OR NO:22294}   Oralia Criger, LCAS

## 2013-12-04 ENCOUNTER — Other Ambulatory Visit (HOSPITAL_COMMUNITY): Payer: Medicare Other | Admitting: Psychology

## 2013-12-04 DIAGNOSIS — F10239 Alcohol dependence with withdrawal, unspecified: Secondary | ICD-10-CM

## 2013-12-06 ENCOUNTER — Other Ambulatory Visit (HOSPITAL_COMMUNITY): Payer: Medicare Other | Admitting: Psychology

## 2013-12-06 DIAGNOSIS — F331 Major depressive disorder, recurrent, moderate: Secondary | ICD-10-CM

## 2013-12-06 DIAGNOSIS — F10230 Alcohol dependence with withdrawal, uncomplicated: Secondary | ICD-10-CM

## 2013-12-06 DIAGNOSIS — F4310 Post-traumatic stress disorder, unspecified: Secondary | ICD-10-CM
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
## 2013-12-09 ENCOUNTER — Other Ambulatory Visit (HOSPITAL_COMMUNITY): Payer: Medicare Other

## 2013-12-11 ENCOUNTER — Encounter (HOSPITAL_COMMUNITY): Payer: Self-pay | Admitting: Psychology

## 2013-12-11 ENCOUNTER — Other Ambulatory Visit (HOSPITAL_COMMUNITY): Payer: Medicare Other

## 2013-12-11 NOTE — Progress Notes (Signed)
    Daily Group Progress Note  Program: CD-IOP   Group Time: 1-2:30 PM  Participation Level: Active  Behavioral Response: Appropriate and Sharing  Type of Therapy: Process Group  Topic: Group Process: the first part off group was spent in process. Members shared about issues, concerns and events over this past weekend. One member described a successful weekend topped with his son telling him he was proud of him. Another member had a call to ED for her 101 yo mother. Everyone attended at least one 12-step meeting with some attending 3-4 meetings. There was good disclosure among members and good validation on recovery-related behaviors. Drug tests were collected from every member present.   Group Time: 2:45- 4PM  Participation Level: Active  Behavioral Response: Appropriate  Type of Therapy: Psycho-Education  Topic: "Dry Drunk": the second half of group was spent in a psycho-ed describing the term "dry drunk". A 2-page handout was provided and members took turns to read the descriptions. The session was effective in identifying the characteristics that occur when one goes from a good recovery program gradually into relapse mentality and, subsequent, relapse. There was a good discussion with those who had relapsed in the past providing examples of how their mentality changed. At the conclusion of the session, a graduation ceremony was held for a member who was successfully leaving the program.   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 She had enjoyed it thoroughly. When she got out of the show, she had a message about her mother being in the ED. She spent the rest of the night and morning at ED. Her mother is okay now, but it was challenging. The patient reported that she had attended the women's AA meeting on Sunday and the speaker meeting had proven very inspiring. The patient made some excellent comments and displayed  good insight into her daily recovery needs. This woman is very open and honest about herself and her 'defects'. She is strong and transparent and this will be required if she is to remain sober. Her sobriety date remains 3/6.    Family Program: Family present? No   Name of family member(s):   UDS collected: No Results:  AA/NA attended?: YesMonday, Saturday and Sunday  Sponsor?: Yes   Kaylin Schellenberg, LCAS

## 2013-12-13 ENCOUNTER — Other Ambulatory Visit (HOSPITAL_COMMUNITY): Payer: Medicare Other | Admitting: Psychology

## 2013-12-13 ENCOUNTER — Encounter (HOSPITAL_COMMUNITY): Payer: Self-pay | Admitting: Psychology

## 2013-12-13 DIAGNOSIS — F329 Major depressive disorder, single episode, unspecified: Secondary | ICD-10-CM

## 2013-12-13 DIAGNOSIS — F1020 Alcohol dependence, uncomplicated: Secondary | ICD-10-CM

## 2013-12-15 ENCOUNTER — Encounter (HOSPITAL_COMMUNITY): Payer: Self-pay | Admitting: Psychology

## 2013-12-15 NOTE — Progress Notes (Unsigned)
[facility] CD-IOP: Discharge         Ashvik Grundman, LCAS

## 2013-12-15 NOTE — Progress Notes (Signed)
Daily Group Progress Note  Program: CD-IOP   Group Time: 1-2:30 pm  Participation Level: Active  Behavioral Response: Appropriate and Sharing  Type of Therapy: Process Group  Topic: Group Process: the first part of group was spent in process. Members began the session with a check-in followed by a 5 minute breathing/meditation session. Members were then invited to share about current issues and concerns. One member seemed to be crying and the group invited her to share. She disclosed the events earlier today with her as of today, 50 yo daughter. She expressed frustration and fears about her daughter's tendency to overreact and get upset at minor things. The group processed this event and behavior and she received good feedback. A new member was present and he shared a little about himself. At only 68 years of age, his new group members were in awe that he was ready and accepting treatment for his chemical dependency. The first session included good sharing and disclosures among the group. During this time the program director was meeting patients for discharge, medication follow-up, and an initial session with a new member.  Group Time: 2:45- 4pm  Participation Level: Active  Behavioral Response: Appropriate  Type of Therapy: Psycho-education Group  Topic: Psycho-Ed/Graduation: the second half of group was spent in a psycho-ed about the developmental process of recovery. Members were provided a handout and they read it together. It identified the difference feelings as well as tasks during the first 18 months of recovery. Members who had achieved previous periods of sobriety shared their own experiences while newer members to recovery identified the "pink cloud", 'reality' and other earlier recovery experiences. At the conclusion of the session, a graduation ceremony was held. Members shared kind words and hopes with the graduating member and she provided wonderful and encouraging words  for the program and people she is leaving.   Summary: The patient reported she had enjoyed the 5 minute breathing/meditation at the begining of group. She had pictured her sister's faces and reminded the group she was heading to the beach on Sunday with both sisters and their husbands. The patient provided good feedback to her fellow group members including sharing about how one's diet - especially with children - can influence behaviors and how dyes and preservatives contribute to health problems. She recounted the pain of her nephew's rejection of her when he grew up. He hadn't wanted to risk his own children experiencing the disappointment of having an active alcoholic make promises and not follow through. The patient displayed good insight when discussing the recovery process. She was able to identify the typical scenarios that almost always proceed her relapses and was able to provide examples of what she now does to insure those don't occur. The graduation was for this patient and group members talked about her intelligence and sophistication, but also her wisdom and insight. This patient made a lovely speech in response to these kind words and agreed she would remain in contact with her fellow group members and intended on seeing them in the [place] meetings. She leaves the program equipped with all of the tools she needs in order to remain abstinent going forward. Her sobriety date remains 3/6. The patient met briefly with the program director for her discharge planning.   Family Program: Family present? No   Name of family member(s):   UDS collected: No Results:   AA/NA attended?: YesThursday and Friday  Sponsor?: Yes   Shawan Tosh, LCAS

## 2013-12-15 NOTE — Progress Notes (Unsigned)
[facility] CD-IOP: Discharge. I met with the patient this morning. She is preparing to leave the program and has completed successfully. Her graduation will be at the end of group today and she leaves with a strong commitment and a wealth of tools to use that will allow her to remain sober going forward. The patient has secured a new sponsor and is attending many different AA and NA meetings. Please see discharge summary and plan scanned in Epic.         Sumner Kirchman, LCAS

## 2013-12-16 ENCOUNTER — Other Ambulatory Visit (HOSPITAL_COMMUNITY): Payer: Medicare Other

## 2013-12-18 ENCOUNTER — Other Ambulatory Visit (HOSPITAL_COMMUNITY): Payer: Medicare Other

## 2013-12-20 ENCOUNTER — Other Ambulatory Visit (HOSPITAL_COMMUNITY): Payer: Medicare Other

## 2013-12-21 ENCOUNTER — Encounter (HOSPITAL_COMMUNITY): Payer: Self-pay

## 2013-12-25 ENCOUNTER — Other Ambulatory Visit (HOSPITAL_COMMUNITY): Payer: Medicare Other

## 2013-12-27 ENCOUNTER — Other Ambulatory Visit (HOSPITAL_COMMUNITY): Payer: Medicare Other

## 2013-12-30 ENCOUNTER — Other Ambulatory Visit (HOSPITAL_COMMUNITY): Payer: Medicare Other | Attending: Psychiatry

## 2014-01-01 ENCOUNTER — Other Ambulatory Visit (HOSPITAL_COMMUNITY): Payer: Medicare Other

## 2014-01-03 ENCOUNTER — Other Ambulatory Visit (HOSPITAL_COMMUNITY): Payer: Medicare Other

## 2014-01-06 ENCOUNTER — Other Ambulatory Visit (HOSPITAL_COMMUNITY): Payer: Medicare Other

## 2014-01-06 DIAGNOSIS — I951 Orthostatic hypotension: Secondary | ICD-10-CM | POA: Diagnosis not present

## 2014-01-06 DIAGNOSIS — R197 Diarrhea, unspecified: Secondary | ICD-10-CM | POA: Diagnosis not present

## 2014-01-06 DIAGNOSIS — F411 Generalized anxiety disorder: Secondary | ICD-10-CM | POA: Diagnosis not present

## 2014-01-08 ENCOUNTER — Other Ambulatory Visit (HOSPITAL_COMMUNITY): Payer: Medicare Other

## 2014-01-09 ENCOUNTER — Encounter: Payer: Self-pay | Admitting: Psychiatry

## 2014-01-10 ENCOUNTER — Other Ambulatory Visit (HOSPITAL_COMMUNITY): Payer: Medicare Other

## 2014-01-13 ENCOUNTER — Other Ambulatory Visit (HOSPITAL_COMMUNITY): Payer: Medicare Other

## 2014-01-15 ENCOUNTER — Other Ambulatory Visit (HOSPITAL_COMMUNITY): Payer: Medicare Other

## 2014-01-24 ENCOUNTER — Other Ambulatory Visit: Payer: Self-pay | Admitting: Internal Medicine

## 2014-01-27 ENCOUNTER — Other Ambulatory Visit: Payer: Self-pay | Admitting: Internal Medicine

## 2014-02-20 ENCOUNTER — Encounter (HOSPITAL_COMMUNITY): Payer: Self-pay | Admitting: Psychology

## 2014-02-23 ENCOUNTER — Emergency Department (HOSPITAL_COMMUNITY)
Admission: EM | Admit: 2014-02-23 | Discharge: 2014-02-23 | Discharge status: Against Medical Advice | Payer: Medicare Other | Attending: Emergency Medicine | Admitting: Emergency Medicine

## 2014-02-23 ENCOUNTER — Encounter (HOSPITAL_COMMUNITY): Payer: Self-pay | Admitting: Emergency Medicine

## 2014-02-23 DIAGNOSIS — Z87891 Personal history of nicotine dependence: Secondary | ICD-10-CM | POA: Diagnosis not present

## 2014-02-23 DIAGNOSIS — I10 Essential (primary) hypertension: Secondary | ICD-10-CM | POA: Diagnosis not present

## 2014-02-23 DIAGNOSIS — Y939 Activity, unspecified: Secondary | ICD-10-CM | POA: Diagnosis not present

## 2014-02-23 DIAGNOSIS — Y929 Unspecified place or not applicable: Secondary | ICD-10-CM | POA: Diagnosis not present

## 2014-02-23 DIAGNOSIS — Z043 Encounter for examination and observation following other accident: Secondary | ICD-10-CM | POA: Diagnosis not present

## 2014-02-23 DIAGNOSIS — W010XXA Fall on same level from slipping, tripping and stumbling without subsequent striking against object, initial encounter: Secondary | ICD-10-CM | POA: Diagnosis not present

## 2014-02-23 NOTE — ED Notes (Signed)
Patient who has been drinking wine, left her home by car and went to local gas station, she then tripped and fell in the business and EMS was called.  Patient denies hitting head, alert and steady gait.  Patient wants to go home. VSS.

## 2014-02-23 NOTE — ED Notes (Signed)
Pt stated she did want to be seen, Pt had cab called and is waiting in out in the lobby.

## 2014-02-26 NOTE — Progress Notes (Signed)
    Daily Group Progress Note  Program: CD-IOP   Group Time: 1-2  Participation Level: Active  Behavioral Response: Sharing, Care-Taking and Intrusive  Type of Therapy: Process Group  Topic: process group: pt was active and attentive during this part of group. She did not share about herself at this point but did comment on other group members when they shared- both when it was asked for and when it wasn't . Patient appears motivated in recovery and denied relapse.      Group Time: 2-3  Participation Level: Active  Behavioral Response: Appropriate and Sharing  Type of Therapy: Process Group  Topic: Process group cont      Summary: The patient did share about the two AA meetings she had been two recently and what she learned in them. She was open and responsive to other people although she did tend to focus on more surface topics in regards to her own life and did not tend to be as vulnerable as some other members.    Family Program: Family present? No   Name of family member(s):   UDS collected: No Results:   AA/NA attended?: YesTuesday and Wednesday  Sponsor?: Yes   [facility]

## 2014-02-28 NOTE — Progress Notes (Signed)
    Daily Group Progress Note  Program: CD-IOP   Group Time: 1-2  Participation Level: Active  Behavioral Response: Appropriate and Sharing  Type of Therapy: Process Group  Topic: Process group: The patient shared about her attempts to become more involved in the recovery community and to break out of the isolation and shame she had been living under prior to sobriety.      Group Time: 2-3  Participation Level: Active  Behavioral Response: Appropriate and Sharing  Type of Therapy: Psycho-education Group  Topic: Recovery Nuggets    Summary: Pt shared about the recovery nugget or idea that stood out the most to her recently: "Do the next right thing". She expressed that she is attempting to follow and focus on God's will for her life and not be wrapped up in self-will. She was very open with the group and other group members responded positively to what she shared.    Family Program: Family present? No   Name of family member(s):   UDS collected: No Results:   AA/NA attended?: YesThursday and Friday  Sponsor?: Yes   [facility]

## 2014-03-10 ENCOUNTER — Other Ambulatory Visit: Payer: Self-pay

## 2014-03-10 DIAGNOSIS — Z1231 Encounter for screening mammogram for malignant neoplasm of breast: Secondary | ICD-10-CM

## 2014-03-10 DIAGNOSIS — E039 Hypothyroidism, unspecified: Secondary | ICD-10-CM | POA: Diagnosis not present

## 2014-03-10 DIAGNOSIS — R7989 Other specified abnormal findings of blood chemistry: Secondary | ICD-10-CM | POA: Diagnosis not present

## 2014-03-10 DIAGNOSIS — R55 Syncope and collapse: Secondary | ICD-10-CM | POA: Diagnosis not present

## 2014-03-10 DIAGNOSIS — R51 Headache: Secondary | ICD-10-CM | POA: Diagnosis not present

## 2014-03-10 DIAGNOSIS — I10 Essential (primary) hypertension: Secondary | ICD-10-CM | POA: Diagnosis not present

## 2014-03-11 ENCOUNTER — Ambulatory Visit
Admission: RE | Admit: 2014-03-11 | Discharge: 2014-03-11 | Disposition: A | Discharge status: Home / Self Care | Payer: Medicare Other | Source: Ambulatory Visit

## 2014-03-11 ENCOUNTER — Telehealth: Payer: Self-pay | Admitting: *Deleted

## 2014-03-11 DIAGNOSIS — Z1231 Encounter for screening mammogram for malignant neoplasm of breast: Secondary | ICD-10-CM

## 2014-03-11 LAB — HM MAMMOGRAPHY: HM Mammogram: NORMAL

## 2014-03-11 NOTE — Telephone Encounter (Signed)
Left msg on triage wanting to know when she is due for her prolia. Called pt back inform her last injection give back in Oct, 2014. Pt is wanting to get her prolia inform pt will have to send msg to get authorization from insurance. Will received call back once we here back from [facility]...Johny Chess

## 2014-03-12 NOTE — Telephone Encounter (Signed)
I have sent pt's info to [employer] for insurance verification and will notify you as soon as I hear something. Thank you.

## 2014-03-20 DIAGNOSIS — I10 Essential (primary) hypertension: Secondary | ICD-10-CM | POA: Diagnosis not present

## 2014-03-20 DIAGNOSIS — M810 Age-related osteoporosis without current pathological fracture: Secondary | ICD-10-CM | POA: Diagnosis not present

## 2014-03-20 DIAGNOSIS — J309 Allergic rhinitis, unspecified: Secondary | ICD-10-CM | POA: Diagnosis not present

## 2014-03-20 DIAGNOSIS — R6889 Other general symptoms and signs: Secondary | ICD-10-CM | POA: Diagnosis not present

## 2014-03-20 DIAGNOSIS — D539 Nutritional anemia, unspecified: Secondary | ICD-10-CM | POA: Diagnosis not present

## 2014-03-20 DIAGNOSIS — E039 Hypothyroidism, unspecified: Secondary | ICD-10-CM | POA: Diagnosis not present

## 2014-03-20 NOTE — Telephone Encounter (Signed)
I have rec'd pt's insurance verification info for her Prolia injection. Mary Graves has met her [facility] deductible so her estimated responsibility will be 20%, which is approximately $180. I have sent the summary of benefits to be scanned into pt's chart. If you have further questions, please let me know. Thank you.

## 2014-03-20 NOTE — Telephone Encounter (Signed)
Please disregard the previous note from earlier today. Pt has a secondary insurance which will cover approximately 100%, which means the pt's estimated responsibility will be $0. If you have further questions please let me know. Thank you.

## 2014-03-20 NOTE — Telephone Encounter (Signed)
Called pt no answer LMOM with Rose response below. Inform Kisha to order prolia. Can schedule for one day next week...Mary Graves

## 2014-03-26 DIAGNOSIS — R55 Syncope and collapse: Secondary | ICD-10-CM | POA: Diagnosis not present

## 2014-03-26 DIAGNOSIS — E039 Hypothyroidism, unspecified: Secondary | ICD-10-CM | POA: Diagnosis not present

## 2014-03-26 DIAGNOSIS — I10 Essential (primary) hypertension: Secondary | ICD-10-CM | POA: Diagnosis not present

## 2014-03-27 DIAGNOSIS — R55 Syncope and collapse: Secondary | ICD-10-CM | POA: Diagnosis not present

## 2014-03-27 DIAGNOSIS — E039 Hypothyroidism, unspecified: Secondary | ICD-10-CM | POA: Diagnosis not present

## 2014-03-27 DIAGNOSIS — I10 Essential (primary) hypertension: Secondary | ICD-10-CM | POA: Diagnosis not present

## 2014-04-02 DIAGNOSIS — L259 Unspecified contact dermatitis, unspecified cause: Secondary | ICD-10-CM | POA: Diagnosis not present

## 2014-04-02 DIAGNOSIS — L408 Other psoriasis: Secondary | ICD-10-CM | POA: Diagnosis not present

## 2014-04-02 DIAGNOSIS — D235 Other benign neoplasm of skin of trunk: Secondary | ICD-10-CM | POA: Diagnosis not present

## 2014-04-04 DIAGNOSIS — F331 Major depressive disorder, recurrent, moderate: Secondary | ICD-10-CM | POA: Diagnosis not present

## 2014-04-14 DIAGNOSIS — R55 Syncope and collapse: Secondary | ICD-10-CM | POA: Diagnosis not present

## 2014-04-21 DIAGNOSIS — E0500 Thyrotoxicosis with diffuse goiter without thyrotoxic crisis or storm: Secondary | ICD-10-CM | POA: Diagnosis not present

## 2014-04-21 DIAGNOSIS — H52209 Unspecified astigmatism, unspecified eye: Secondary | ICD-10-CM | POA: Diagnosis not present

## 2014-05-02 DIAGNOSIS — R55 Syncope and collapse: Secondary | ICD-10-CM | POA: Diagnosis not present

## 2014-05-02 DIAGNOSIS — I10 Essential (primary) hypertension: Secondary | ICD-10-CM | POA: Diagnosis not present

## 2014-05-02 DIAGNOSIS — E039 Hypothyroidism, unspecified: Secondary | ICD-10-CM | POA: Diagnosis not present

## 2014-05-09 DIAGNOSIS — Z23 Encounter for immunization: Secondary | ICD-10-CM | POA: Diagnosis not present

## 2014-05-11 ENCOUNTER — Encounter (HOSPITAL_COMMUNITY): Payer: Self-pay

## 2014-07-10 ENCOUNTER — Emergency Department (HOSPITAL_COMMUNITY): Payer: Medicare Other

## 2014-07-10 ENCOUNTER — Inpatient Hospital Stay (HOSPITAL_COMMUNITY)
Admission: EM | Admit: 2014-07-10 | Discharge: 2014-07-14 | DRG: 640 | Disposition: A | Discharge status: Home Health | Payer: Medicare Other | Attending: Internal Medicine | Admitting: Internal Medicine

## 2014-07-10 ENCOUNTER — Encounter (HOSPITAL_COMMUNITY): Payer: Self-pay | Admitting: Emergency Medicine

## 2014-07-10 DIAGNOSIS — R7989 Other specified abnormal findings of blood chemistry: Secondary | ICD-10-CM | POA: Diagnosis not present

## 2014-07-10 DIAGNOSIS — I214 Non-ST elevation (NSTEMI) myocardial infarction: Secondary | ICD-10-CM | POA: Diagnosis not present

## 2014-07-10 DIAGNOSIS — I517 Cardiomegaly: Secondary | ICD-10-CM | POA: Diagnosis not present

## 2014-07-10 DIAGNOSIS — I959 Hypotension, unspecified: Secondary | ICD-10-CM | POA: Diagnosis not present

## 2014-07-10 DIAGNOSIS — R9431 Abnormal electrocardiogram [ECG] [EKG]: Secondary | ICD-10-CM | POA: Diagnosis present

## 2014-07-10 DIAGNOSIS — E876 Hypokalemia: Secondary | ICD-10-CM | POA: Diagnosis present

## 2014-07-10 DIAGNOSIS — R55 Syncope and collapse: Secondary | ICD-10-CM | POA: Diagnosis not present

## 2014-07-10 DIAGNOSIS — R0602 Shortness of breath: Secondary | ICD-10-CM

## 2014-07-10 DIAGNOSIS — D6959 Other secondary thrombocytopenia: Secondary | ICD-10-CM | POA: Diagnosis present

## 2014-07-10 DIAGNOSIS — Y901 Blood alcohol level of 20-39 mg/100 ml: Secondary | ICD-10-CM | POA: Diagnosis present

## 2014-07-10 DIAGNOSIS — F1010 Alcohol abuse, uncomplicated: Secondary | ICD-10-CM

## 2014-07-10 DIAGNOSIS — Z811 Family history of alcohol abuse and dependence: Secondary | ICD-10-CM | POA: Diagnosis not present

## 2014-07-10 DIAGNOSIS — E8729 Other acidosis: Secondary | ICD-10-CM

## 2014-07-10 DIAGNOSIS — I4581 Long QT syndrome: Secondary | ICD-10-CM | POA: Diagnosis not present

## 2014-07-10 DIAGNOSIS — N181 Chronic kidney disease, stage 1: Secondary | ICD-10-CM | POA: Diagnosis present

## 2014-07-10 DIAGNOSIS — E872 Acidosis: Principal | ICD-10-CM

## 2014-07-10 DIAGNOSIS — Z9119 Patient's noncompliance with other medical treatment and regimen: Secondary | ICD-10-CM | POA: Diagnosis present

## 2014-07-10 DIAGNOSIS — F329 Major depressive disorder, single episode, unspecified: Secondary | ICD-10-CM | POA: Diagnosis present

## 2014-07-10 DIAGNOSIS — F10229 Alcohol dependence with intoxication, unspecified: Secondary | ICD-10-CM

## 2014-07-10 DIAGNOSIS — Z66 Do not resuscitate: Secondary | ICD-10-CM | POA: Diagnosis present

## 2014-07-10 DIAGNOSIS — R404 Transient alteration of awareness: Secondary | ICD-10-CM | POA: Diagnosis not present

## 2014-07-10 DIAGNOSIS — E039 Hypothyroidism, unspecified: Secondary | ICD-10-CM | POA: Diagnosis present

## 2014-07-10 DIAGNOSIS — F10231 Alcohol dependence with withdrawal delirium: Secondary | ICD-10-CM | POA: Diagnosis present

## 2014-07-10 DIAGNOSIS — F4310 Post-traumatic stress disorder, unspecified: Secondary | ICD-10-CM | POA: Diagnosis present

## 2014-07-10 DIAGNOSIS — I10 Essential (primary) hypertension: Secondary | ICD-10-CM | POA: Diagnosis present

## 2014-07-10 DIAGNOSIS — Z7982 Long term (current) use of aspirin: Secondary | ICD-10-CM | POA: Diagnosis not present

## 2014-07-10 DIAGNOSIS — I129 Hypertensive chronic kidney disease with stage 1 through stage 4 chronic kidney disease, or unspecified chronic kidney disease: Secondary | ICD-10-CM | POA: Diagnosis present

## 2014-07-10 DIAGNOSIS — Z87891 Personal history of nicotine dependence: Secondary | ICD-10-CM | POA: Diagnosis not present

## 2014-07-10 DIAGNOSIS — D638 Anemia in other chronic diseases classified elsewhere: Secondary | ICD-10-CM | POA: Diagnosis present

## 2014-07-10 DIAGNOSIS — E785 Hyperlipidemia, unspecified: Secondary | ICD-10-CM | POA: Diagnosis present

## 2014-07-10 DIAGNOSIS — R1013 Epigastric pain: Secondary | ICD-10-CM | POA: Diagnosis present

## 2014-07-10 DIAGNOSIS — E861 Hypovolemia: Secondary | ICD-10-CM | POA: Diagnosis present

## 2014-07-10 DIAGNOSIS — Z79899 Other long term (current) drug therapy: Secondary | ICD-10-CM | POA: Diagnosis not present

## 2014-07-10 DIAGNOSIS — R778 Other specified abnormalities of plasma proteins: Secondary | ICD-10-CM | POA: Diagnosis present

## 2014-07-10 DIAGNOSIS — R509 Fever, unspecified: Secondary | ICD-10-CM | POA: Diagnosis not present

## 2014-07-10 LAB — URINALYSIS, ROUTINE W REFLEX MICROSCOPIC
Bilirubin Urine: NEGATIVE
Glucose, UA: NEGATIVE mg/dL
Ketones, ur: 40 mg/dL — AB
Leukocytes, UA: NEGATIVE
NITRITE: NEGATIVE
Protein, ur: 100 mg/dL — AB
Specific Gravity, Urine: 1.021 (ref 1.005–1.030)
UROBILINOGEN UA: 0.2 mg/dL (ref 0.0–1.0)
pH: 5 (ref 5.0–8.0)

## 2014-07-10 LAB — BASIC METABOLIC PANEL
Anion gap: 32 — ABNORMAL HIGH (ref 5–15)
BUN: 20 mg/dL (ref 6–23)
CO2: 15 meq/L — AB (ref 19–32)
Calcium: 9.3 mg/dL (ref 8.4–10.5)
Chloride: 90 mEq/L — ABNORMAL LOW (ref 96–112)
Creatinine, Ser: 0.89 mg/dL (ref 0.50–1.10)
GFR calc Af Amer: 75 mL/min — ABNORMAL LOW (ref >90)
GFR calc non Af Amer: 65 mL/min — ABNORMAL LOW (ref >90)
Glucose, Bld: 69 mg/dL — ABNORMAL LOW (ref 70–99)
Potassium: 3.9 mEq/L (ref 3.7–5.3)
SODIUM: 137 meq/L (ref 137–147)

## 2014-07-10 LAB — HEPATIC FUNCTION PANEL
ALBUMIN: 3.6 g/dL (ref 3.5–5.2)
ALK PHOS: 52 U/L (ref 39–117)
ALT: 15 U/L (ref 0–35)
AST: 37 U/L (ref 0–37)
Bilirubin, Direct: <0.2 mg/dL (ref 0.0–0.3)
Total Bilirubin: 0.8 mg/dL (ref 0.3–1.2)
Total Protein: 6.8 g/dL (ref 6.0–8.3)

## 2014-07-10 LAB — RAPID URINE DRUG SCREEN, HOSP PERFORMED
Amphetamines: NOT DETECTED
Barbiturates: NOT DETECTED
Benzodiazepines: NOT DETECTED
COCAINE: NOT DETECTED
Opiates: NOT DETECTED
Tetrahydrocannabinol: NOT DETECTED

## 2014-07-10 LAB — CBC
HEMATOCRIT: 36.6 % (ref 36.0–46.0)
HEMOGLOBIN: 12.3 g/dL (ref 12.0–15.0)
MCH: 30.1 pg (ref 26.0–34.0)
MCHC: 33.6 g/dL (ref 30.0–36.0)
MCV: 89.7 fL (ref 78.0–100.0)
Platelets: 121 10*3/uL — ABNORMAL LOW (ref 150–400)
RBC: 4.08 MIL/uL (ref 3.87–5.11)
RDW: 14.2 % (ref 11.5–15.5)
WBC: 7.6 10*3/uL (ref 4.0–10.5)

## 2014-07-10 LAB — TROPONIN I
Troponin I: <0.3 ng/mL (ref <0.30)
Troponin I: 0.87 ng/mL (ref <0.30)

## 2014-07-10 LAB — ETHANOL: ALCOHOL ETHYL (B): 38 mg/dL — AB (ref 0–11)

## 2014-07-10 LAB — LIPASE, BLOOD: Lipase: 31 U/L (ref 11–59)

## 2014-07-10 LAB — URINE MICROSCOPIC-ADD ON

## 2014-07-10 LAB — CBG MONITORING, ED
Glucose-Capillary: 66 mg/dL — ABNORMAL LOW (ref 70–99)
Glucose-Capillary: 189 mg/dL — ABNORMAL HIGH (ref 70–99)

## 2014-07-10 LAB — MRSA PCR SCREENING: MRSA by PCR: NEGATIVE

## 2014-07-10 MED ORDER — DEXTROSE 50 % IV SOLN
1.0000 | Freq: Once | INTRAVENOUS | Status: AC
  Administered 2014-07-10: 50 mL via INTRAVENOUS
  Filled 2014-07-10: qty 50

## 2014-07-10 MED ORDER — HYDROCODONE-ACETAMINOPHEN 5-325 MG PO TABS
2.0000 | ORAL_TABLET | Freq: Once | ORAL | Status: AC
  Administered 2014-07-10: 2 via ORAL
  Filled 2014-07-10: qty 2

## 2014-07-10 MED ORDER — SODIUM CHLORIDE 0.9 % IV BOLUS (SEPSIS)
1000.0000 mL | Freq: Once | INTRAVENOUS | Status: AC
  Administered 2014-07-10: 1000 mL via INTRAVENOUS

## 2014-07-10 MED ORDER — LEVOTHYROXINE SODIUM 75 MCG PO TABS
175.0000 ug | ORAL_TABLET | Freq: Every day | ORAL | Status: DC
  Administered 2014-07-11: 175 ug via ORAL
  Filled 2014-07-10: qty 1
  Filled 2014-07-10: qty 2

## 2014-07-10 MED ORDER — PANTOPRAZOLE SODIUM 40 MG PO TBEC
40.0000 mg | DELAYED_RELEASE_TABLET | Freq: Every day | ORAL | Status: DC
  Administered 2014-07-10 – 2014-07-14 (×5): 40 mg via ORAL
  Filled 2014-07-10 (×5): qty 1

## 2014-07-10 MED ORDER — LORAZEPAM 2 MG/ML IJ SOLN
0.0000 mg | Freq: Four times a day (QID) | INTRAMUSCULAR | Status: DC
  Filled 2014-07-10: qty 1

## 2014-07-10 MED ORDER — LORAZEPAM 1 MG PO TABS
1.0000 mg | ORAL_TABLET | Freq: Four times a day (QID) | ORAL | Status: DC | PRN
  Administered 2014-07-11 (×2): 1 mg via ORAL
  Filled 2014-07-10 (×2): qty 1

## 2014-07-10 MED ORDER — SODIUM CHLORIDE 0.9 % IJ SOLN
3.0000 mL | Freq: Two times a day (BID) | INTRAMUSCULAR | Status: DC
  Administered 2014-07-13 – 2014-07-14 (×2): 3 mL via INTRAVENOUS

## 2014-07-10 MED ORDER — ONE-DAILY MULTI VITAMINS PO TABS: 1.0000 | ORAL_TABLET | Freq: Every day | ORAL | Status: DC

## 2014-07-10 MED ORDER — SERTRALINE HCL 100 MG PO TABS
100.0000 mg | ORAL_TABLET | Freq: Every day | ORAL | Status: DC
  Administered 2014-07-10 – 2014-07-14 (×5): 100 mg via ORAL
  Filled 2014-07-10 (×5): qty 1

## 2014-07-10 MED ORDER — LORAZEPAM 1 MG PO TABS
1.0000 mg | ORAL_TABLET | Freq: Once | ORAL | Status: AC
  Administered 2014-07-10: 1 mg via ORAL
  Filled 2014-07-10 (×2): qty 1

## 2014-07-10 MED ORDER — CALCIUM CITRATE-VITAMIN D 315-200 MG-UNIT PO TABS: 1.0000 | ORAL_TABLET | Freq: Every day | ORAL | Status: DC

## 2014-07-10 MED ORDER — HYDROCODONE-ACETAMINOPHEN 5-325 MG PO TABS
1.0000 | ORAL_TABLET | ORAL | Status: DC | PRN
  Administered 2014-07-11 – 2014-07-13 (×2): 1 via ORAL
  Filled 2014-07-10 (×3): qty 1

## 2014-07-10 MED ORDER — LORAZEPAM 1 MG PO TABS: 0.0000 mg | ORAL_TABLET | Freq: Two times a day (BID) | ORAL | Status: DC

## 2014-07-10 MED ORDER — M.V.I. ADULT IV INJ
INJECTION | Freq: Once | INTRAVENOUS | Status: AC
  Administered 2014-07-10: 17:00:00 via INTRAVENOUS
  Filled 2014-07-10: qty 1000

## 2014-07-10 MED ORDER — CALCIUM CARBONATE-VITAMIN D 500-200 MG-UNIT PO TABS
1.0000 | ORAL_TABLET | Freq: Every day | ORAL | Status: DC
  Administered 2014-07-10 – 2014-07-14 (×5): 1 via ORAL
  Filled 2014-07-10 (×5): qty 1

## 2014-07-10 MED ORDER — FOLIC ACID 1 MG PO TABS
1.0000 mg | ORAL_TABLET | Freq: Every day | ORAL | Status: DC
  Administered 2014-07-11 – 2014-07-14 (×3): 1 mg via ORAL
  Filled 2014-07-10 (×4): qty 1

## 2014-07-10 MED ORDER — LORAZEPAM 1 MG PO TABS
0.0000 mg | ORAL_TABLET | Freq: Four times a day (QID) | ORAL | Status: DC
  Administered 2014-07-10: 1 mg via ORAL
  Filled 2014-07-10 (×2): qty 1

## 2014-07-10 MED ORDER — LORAZEPAM 2 MG/ML IJ SOLN
1.0000 mg | Freq: Once | INTRAMUSCULAR | Status: AC
  Administered 2014-07-10: 1 mg via INTRAVENOUS
  Filled 2014-07-10: qty 1

## 2014-07-10 MED ORDER — ADULT MULTIVITAMIN W/MINERALS CH
1.0000 | ORAL_TABLET | Freq: Every day | ORAL | Status: DC
  Administered 2014-07-10 – 2014-07-11 (×2): 1 via ORAL
  Filled 2014-07-10 (×2): qty 1

## 2014-07-10 MED ORDER — LORAZEPAM 2 MG/ML IJ SOLN: 0.0000 mg | Freq: Two times a day (BID) | INTRAMUSCULAR | Status: DC

## 2014-07-10 MED ORDER — HYDRALAZINE HCL 20 MG/ML IJ SOLN
10.0000 mg | INTRAMUSCULAR | Status: AC
  Administered 2014-07-10: 10 mg via INTRAVENOUS
  Filled 2014-07-10: qty 1

## 2014-07-10 MED ORDER — SODIUM CHLORIDE 0.9 % IV SOLN: INTRAVENOUS | Status: DC

## 2014-07-10 MED ORDER — DEXTROSE-NACL 5-0.45 % IV SOLN
INTRAVENOUS | Status: DC
  Administered 2014-07-10 – 2014-07-11 (×2): via INTRAVENOUS
  Administered 2014-07-12: 1000 mL via INTRAVENOUS
  Administered 2014-07-13: 08:00:00 via INTRAVENOUS

## 2014-07-10 MED ORDER — LORAZEPAM 2 MG/ML IJ SOLN
1.0000 mg | Freq: Four times a day (QID) | INTRAMUSCULAR | Status: DC | PRN
  Administered 2014-07-11 (×2): 1 mg via INTRAVENOUS
  Filled 2014-07-10 (×2): qty 1

## 2014-07-10 MED ORDER — VITAMIN B-1 100 MG PO TABS
100.0000 mg | ORAL_TABLET | Freq: Every day | ORAL | Status: DC
  Administered 2014-07-10 – 2014-07-11 (×2): 100 mg via ORAL
  Filled 2014-07-10 (×2): qty 1

## 2014-07-10 MED ORDER — HEPARIN SODIUM (PORCINE) 5000 UNIT/ML IJ SOLN
5000.0000 [IU] | Freq: Three times a day (TID) | INTRAMUSCULAR | Status: DC
  Administered 2014-07-10: 5000 [IU] via SUBCUTANEOUS
  Filled 2014-07-10: qty 1

## 2014-07-10 MED ORDER — HYDROXYZINE HCL 50 MG/ML IM SOLN
25.0000 mg | Freq: Four times a day (QID) | INTRAMUSCULAR | Status: DC | PRN
  Filled 2014-07-10: qty 0.5

## 2014-07-10 MED ORDER — DEXTROSE 50 % IV SOLN: 1.0000 | INTRAVENOUS | Status: DC | PRN

## 2014-07-10 NOTE — ED Notes (Signed)
Per EMS. Pt had 2 witnessed syncopal episodes at gas station. When EMS arrived pt refused transport, but pt was shaky and was able to be persuaded to come to ED for evaluation. Pt reports drinking ETOH for the past 2 weeks. Has been throwing up this am, has not drank anything today. No SI. EMS gave 4 mg zofran.

## 2014-07-10 NOTE — ED Provider Notes (Signed)
CSN: [id number]     Arrival date & time 07/10/14  1455 History   First MD Initiated Contact with Patient 07/10/14 1537     Chief Complaint  Patient presents with  . Loss of Consciousness     (Consider location/radiation/quality/duration/timing/severity/associated sxs/prior Treatment) HPI Comments: Patient is a 68 yo F PMHx significant for HTN, Hypothyroidism, Alcoholism, Pancreatitis presenting to the ED for evaluation of two syncopal episodes earlier today. Patient states she has been drinking a gallon of alcohol a day for the last two weeks until her last drink last evening. She states she has had several episodes of non-bloody non-bilious emesis today. She states she felt lightheaded prior to each syncopal episode. Patient has a history of ETOH withdrawal seizures.   Patient is a 68 y.o. female presenting with syncope.  Loss of Consciousness Associated symptoms: nausea and vomiting   Associated symptoms: no chest pain and no shortness of breath     Past Medical History  Diagnosis Date  . Hypertension   . Hypothyroidism   . Alcoholism   . Pancreatitis    Past Surgical History  Procedure Laterality Date  . Tonsillectomy    . Knee surgery Left     x 2  . Neck surgery      bilateral facet arthrosis   Family History  Problem Relation Age of Onset  . Cancer Other     Breast cancer  . Heart disease Neg Hx   . Hyperlipidemia Neg Hx   . Hypertension Neg Hx   . Stroke Neg Hx   . Alcohol abuse Mother    History  Substance Use Topics  . Smoking status: Former Smoker    Quit date: 11/06/1983  . Smokeless tobacco: Never Used  . Alcohol Use: 42.0 oz/week    0 Not specified, 70 Glasses of wine per week     Comment: history of alcoholism   OB History    No data available     Review of Systems  Respiratory: Negative for shortness of breath.   Cardiovascular: Positive for syncope. Negative for chest pain.  Gastrointestinal: Positive for nausea and vomiting. Negative for  abdominal pain.  Neurological: Positive for tremors and syncope.  All other systems reviewed and are negative.     Allergies  Ace inhibitors  Home Medications   Prior to Admission medications   Medication Sig Start Date End Date Taking? Authorizing Provider  Calcium Carbonate-Vitamin D (CALCIUM + D PO) Take 1 tablet by mouth daily.    Yes Historical Provider, MD  folic acid (FOLVITE) 1 MG tablet Take 1 tablet (1 mg total) by mouth daily. 10/04/13  Yes Velvet Bathe, MD  levothyroxine (SYNTHROID, LEVOTHROID) 175 MCG tablet TAKE ONE TABLET BY MOUTH ONCE DAILY BEFORE BREAKFAST 01/24/14  Yes Janith Lima, MD  Multiple Vitamin (MULTIVITAMIN) tablet Take 1 tablet by mouth daily.   Yes Historical Provider, MD  sertraline (ZOLOFT) 100 MG tablet Take 1 tablet (100 mg total) by mouth daily. 06/05/12  Yes Waylan Boga, NP  thiamine 100 MG tablet Take 1 tablet (100 mg total) by mouth daily. 10/04/13  Yes Velvet Bathe, MD  levothyroxine (SYNTHROID, LEVOTHROID) 175 MCG tablet TAKE ONE TABLET BY MOUTH ONCE DAILY BEFORE BREAKFAST Patient not taking: Reported on 07/10/2014 01/27/14   Janith Lima, MD   BP 144/44 mmHg  Pulse 106  Temp(Src) 98.7 F (37.1 C) (Oral)  Resp 20  Ht 5' 6.5" (1.689 m)  Wt 137 lb 12.6 oz (62.5 kg)  BMI  21.91 kg/m2  SpO2 99% Physical Exam  Constitutional: She is oriented to person, place, and time. She appears well-developed and well-nourished. She is sleeping.  Non-toxic appearance. No distress.  HENT:  Head: Normocephalic and atraumatic.  Right Ear: External ear normal.  Left Ear: External ear normal.  Nose: Nose normal.  Mouth/Throat: Uvula is midline and oropharynx is clear and moist. Mucous membranes are dry.  Eyes: Conjunctivae are normal.  Neck: Normal range of motion. Neck supple.  Cardiovascular: Regular rhythm and normal heart sounds.   Pulmonary/Chest: Effort normal and breath sounds normal.  Abdominal: Soft. Bowel sounds are normal. She exhibits no  distension. There is no tenderness. There is no rebound and no guarding.  Musculoskeletal: Normal range of motion. She exhibits no edema or tenderness.  Neurological: She is alert and oriented to person, place, and time. She displays tremor (bilateral upper extremity). GCS eye subscore is 4. GCS verbal subscore is 5. GCS motor subscore is 6.  Skin: Skin is warm and dry. She is not diaphoretic.  Psychiatric: She has a normal mood and affect. She is actively hallucinating. She expresses no homicidal and no suicidal ideation.  Nursing note and vitals reviewed.   ED Course  Procedures (including critical care time) Medications  LORazepam (ATIVAN) injection 0-4 mg (not administered)    Followed by  LORazepam (ATIVAN) injection 0-4 mg (not administered)  dextrose 50 % solution 50 mL (not administered)  levothyroxine (SYNTHROID, LEVOTHROID) tablet 175 mcg (not administered)  folic acid (FOLVITE) tablet 1 mg (not administered)  thiamine (VITAMIN B-1) tablet 100 mg (not administered)  sertraline (ZOLOFT) tablet 100 mg (not administered)  pantoprazole (PROTONIX) EC tablet 40 mg (not administered)  hydrOXYzine (VISTARIL) injection 25 mg (not administered)  heparin injection 5,000 Units (not administered)  sodium chloride 0.9 % injection 3 mL (not administered)  dextrose 5 %-0.45 % sodium chloride infusion ( Intravenous New Bag/Given 07/10/14 2232)  calcium-vitamin D (OSCAL WITH D) 500-200 MG-UNIT per tablet 1 tablet (not administered)  multivitamin with minerals tablet 1 tablet (not administered)  LORazepam (ATIVAN) injection 1 mg (1 mg Intravenous Given 07/10/14 1529)  LORazepam (ATIVAN) injection 1 mg (1 mg Intravenous Given 07/10/14 1536)  sodium chloride 0.9 % 1,000 mL with thiamine 782 mg, folic acid 1 mg, multivitamins adult 10 mL infusion ( Intravenous New Bag/Given 07/10/14 1645)  dextrose 50 % solution 50 mL (50 mLs Intravenous Given 07/10/14 1700)  hydrALAZINE (APRESOLINE) injection 10  mg (10 mg Intravenous Given 07/10/14 1900)  sodium chloride 0.9 % bolus 1,000 mL (1,000 mLs Intravenous New Bag/Given 07/10/14 1809)  LORazepam (ATIVAN) tablet 1 mg (1 mg Oral Given 07/10/14 1903)    Labs Review Labs Reviewed  CBC - Abnormal; Notable for the following:    Platelets 121 (*)    All other components within normal limits  BASIC METABOLIC PANEL - Abnormal; Notable for the following:    Chloride 90 (*)    CO2 15 (*)    Glucose, Bld 69 (*)    GFR calc non Af Amer 65 (*)    GFR calc Af Amer 75 (*)    Anion gap 32 (*)    All other components within normal limits  ETHANOL - Abnormal; Notable for the following:    Alcohol, Ethyl (B) 38 (*)    All other components within normal limits  URINALYSIS, ROUTINE W REFLEX MICROSCOPIC - Abnormal; Notable for the following:    APPearance CLOUDY (*)    Hgb urine dipstick SMALL (*)  Ketones, ur 40 (*)    Protein, ur 100 (*)    All other components within normal limits  BLOOD GAS, VENOUS - Abnormal; Notable for the following:    pH, Ven 7.424 (*)    pCO2, Ven 37.8 (*)    Bicarbonate 24.1 (*)    All other components within normal limits  TROPONIN I - Abnormal; Notable for the following:    Troponin I 0.87 (*)    All other components within normal limits  CBG MONITORING, ED - Abnormal; Notable for the following:    Glucose-Capillary 66 (*)    All other components within normal limits  CBG MONITORING, ED - Abnormal; Notable for the following:    Glucose-Capillary 189 (*)    All other components within normal limits  MRSA PCR SCREENING  CULTURE, BLOOD (ROUTINE X 2)  CULTURE, BLOOD (ROUTINE X 2)  TROPONIN I  URINE RAPID DRUG SCREEN (HOSP PERFORMED)  LIPASE, BLOOD  URINE MICROSCOPIC-ADD ON  HEPATIC FUNCTION PANEL  TSH  TROPONIN I  TROPONIN I  MAGNESIUM  PHOSPHORUS  COMPREHENSIVE METABOLIC PANEL  CBC  PROTIME-INR  POCT CBG (FASTING - GLUCOSE)-MANUAL ENTRY    Imaging Review Ct Head Wo Contrast  07/10/2014   CLINICAL  DATA:  Two witnessed syncopal episodes at a gas station. Patient refused transport by EMS. Drinking alcohol for the past 2 weeks. Vomiting this morning.  EXAM: CT HEAD WITHOUT CONTRAST  TECHNIQUE: Contiguous axial images were obtained from the base of the skull through the vertex without intravenous contrast.  COMPARISON:  03/28/2011  FINDINGS: There is no intra or extra-axial fluid collection or mass lesion. The basilar cisterns and ventricles have a normal appearance. There is no CT evidence for acute infarction or hemorrhage. Bone windows are unremarkable.  IMPRESSION: Negative exam.   Electronically Signed   By: Shon Hale M.D.   On: 07/10/2014 16:38     EKG Interpretation None       MDM   Final diagnoses:  Alcoholic ketoacidosis  Alcohol abuse    Filed Vitals:   07/10/14 2127  BP: 144/44  Pulse: 106  Temp:   Resp:    Afebrile, NAD, non-toxic appearing, AAOx4.   I have reviewed nursing notes, vital signs, and all appropriate lab and imaging results for this patient. Patient tachycardic with dry mucous membranes. Bilateral tremors, otherwise no other significant physical examination findings. Syncope with precipitating lightheadedness, likely d/t dehydration. IVF started. CIWA protocol placed. Patient will be admitted for alcoholic ketoacidosis and evaluation for possible alcohol withdrawal seizures. Patient d/w with Dr. Colin Rhein, agrees with plan.    Harlow Mares, PA-C 07/10/14 2311  Debby Freiberg, MD 07/14/14 [phone]

## 2014-07-10 NOTE — H&P (Signed)
[facility] History and Physical  Bryelle Spiewak DEY:[id number] DOB: [birth year] DOA: 07/10/2014  Referring physician: ED physician PCP: Scarlette Calico, MD  Specialists:   Chief Complaint: Shortness of breath, nausea, vomiting and abdominal pain.  HPI: Mary Graves is a 68 y.o. female with past medical history of hypertension, pancreatitis, hypothyroidism, alcoholism, chronic kidney disease-1, dementia, depression, who presents with shortness of breath, nausea, vomiting and abdominal pain.  Patient states she has been drinking a gallon of alcohol per day in the past two weeks. The last drink was this evening. She states she has had several episodes of non-bloody and non-bilious emesis today. She state that she can not eat food and has abdominal pain. She reports she had fall and two episodes of syncope. She felt lightheaded before syncope episodes. Patient also reports having shortness of breath. No cough or chest pain. No fever or chills. Of note, patient had a history of ETOH withdrawal seizures.   Work up in the ED demonstrates negative CT head for acute abnormalities. BMP showed bicarbonate of 15 with anion gap 32. Lipase 31. Urinalysis negative for UTI, but with ketone 40. EKG showed T-wave inversion in aVL and elongation of the QTc interval 508. Patient is admitted to inpatient for further evaluation and treatment.  Review of Systems: As presented in the history of presenting illness, rest negative.  Where does patient live?  At home Can patient participate in ADLs? little  Allergy:  Allergies  Allergen Reactions  . Ace Inhibitors     REACTION: cough  . Folic Acid Rash    Past Medical History  Diagnosis Date  . Hypertension   . Hypothyroidism   . Alcoholism   . Pancreatitis     Past Surgical History  Procedure Laterality Date  . Tonsillectomy    . Knee surgery Left     x 2  . Neck surgery      bilateral facet arthrosis    Social History:  reports that she quit  smoking about 30 years ago. She has never used smokeless tobacco. She reports that she drinks about 42.0 oz of alcohol per week. She reports that she does not use illicit drugs.  Family History:  Family History  Problem Relation Age of Onset  . Cancer Other     Breast cancer  . Heart disease Neg Hx   . Hyperlipidemia Neg Hx   . Hypertension Neg Hx   . Stroke Neg Hx   . Alcohol abuse Mother      Prior to Admission medications   Medication Sig Start Date End Date Taking? Authorizing Provider  Calcium Carbonate-Vitamin D (CALCIUM + D PO) Take 1 tablet by mouth daily.    Yes Historical Provider, MD  folic acid (FOLVITE) 1 MG tablet Take 1 tablet (1 mg total) by mouth daily. 10/04/13  Yes Velvet Bathe, MD  levothyroxine (SYNTHROID, LEVOTHROID) 175 MCG tablet TAKE ONE TABLET BY MOUTH ONCE DAILY BEFORE BREAKFAST 01/24/14  Yes Janith Lima, MD  Multiple Vitamin (MULTIVITAMIN) tablet Take 1 tablet by mouth daily.   Yes Historical Provider, MD  sertraline (ZOLOFT) 100 MG tablet Take 1 tablet (100 mg total) by mouth daily. 06/05/12  Yes Waylan Boga, NP  thiamine 100 MG tablet Take 1 tablet (100 mg total) by mouth daily. 10/04/13  Yes Velvet Bathe, MD  levothyroxine (SYNTHROID, LEVOTHROID) 175 MCG tablet TAKE ONE TABLET BY MOUTH ONCE DAILY BEFORE BREAKFAST Patient not taking: Reported on 07/10/2014 01/27/14   Janith Lima, MD  Physical Exam: Filed Vitals:   07/10/14 2330 07/11/14 0000 07/11/14 0125 07/11/14 0228  BP: 142/42  144/51 156/62  Pulse: 103  100 96  Temp:  97.6 F (36.4 C)    TempSrc:  Oral    Resp: 19  17 13   Height:      Weight:      SpO2: 99%  97% 96%   General: Not in acute distress. Anxious and tremulous HEENT:       Eyes: PERRL, EOMI, no scleral icterus       ENT: No discharge from the ears and nose, no pharynx injection, no tonsillar enlargement.        Neck: No JVD, no bruit, no mass felt. Cardiac: S1/S2, RRR, No murmurs, No gallops or rubs Pulm: Good air movement  bilaterally. Clear to auscultation bilaterally. No rales, wheezing, rhonchi or rubs. Abd: Soft, nondistended, nontender, no rebound pain, no organomegaly, BS present Ext: No edema bilaterally. 2+DP/PT pulse bilaterally Musculoskeletal: No joint deformities, erythema, or stiffness, ROM full Skin: No rashes.  Neuro: Alert and oriented X3, cranial nerves II-XII grossly intact, muscle strength 5/5 in all extremeties, sensation to light touch intact. Brachial reflex 2+ bilaterally. Knee reflex 1+ bilaterally. Negative Babinski's sign. Normal finger to nose test. Psych: Patient is not psychotic, no suicidal or hemocidal ideation.  Labs on Admission:  Basic Metabolic Panel:  Recent Labs Lab 07/10/14 1546  NA 137  K 3.9  CL 90*  CO2 15*  GLUCOSE 69*  BUN 20  CREATININE 0.89  CALCIUM 9.3   Liver Function Tests:  Recent Labs Lab 07/10/14 2155  AST 37  ALT 15  ALKPHOS 52  BILITOT 0.8  PROT 6.8  ALBUMIN 3.6    Recent Labs Lab 07/10/14 1719  LIPASE 31   No results for input(s): AMMONIA in the last 168 hours. CBC:  Recent Labs Lab 07/10/14 1546  WBC 7.6  HGB 12.3  HCT 36.6  MCV 89.7  PLT 121*   Cardiac Enzymes:  Recent Labs Lab 07/10/14 1546 07/10/14 2155  TROPONINI <0.30 0.87*    BNP (last 3 results) No results for input(s): PROBNP in the last 8760 hours. CBG:  Recent Labs Lab 07/10/14 1651 07/10/14 1739  GLUCAP 66* 189*    Radiological Exams on Admission: Ct Head Wo Contrast  07/10/2014   CLINICAL DATA:  Two witnessed syncopal episodes at a gas station. Patient refused transport by EMS. Drinking alcohol for the past 2 weeks. Vomiting this morning.  EXAM: CT HEAD WITHOUT CONTRAST  TECHNIQUE: Contiguous axial images were obtained from the base of the skull through the vertex without intravenous contrast.  COMPARISON:  03/28/2011  FINDINGS: There is no intra or extra-axial fluid collection or mass lesion. The basilar cisterns and ventricles have a normal  appearance. There is no CT evidence for acute infarction or hemorrhage. Bone windows are unremarkable.  IMPRESSION: Negative exam.   Electronically Signed   By: Shon Hale M.D.   On: 07/10/2014 16:38    EKG: Independently reviewed.   Assessment/Plan Principal Problem:   Alcoholic ketoacidosis Active Problems:   Major depression   HLD (hyperlipidemia)   Alcoholism with alcohol dependence   PTSD (post-traumatic stress disorder)   QT prolongation   Elevated troponin   Syncope  Alcoholic ketoacidosis: Patient is heavy drinker. Has been bing-drinking in the past two weeks. Presents with bicarbonate of 15 with anion gap 32. No history of DM. Patient's syncope episode is most likely due to alcohol abuse. -will admit to  Tele bed -CIWA protocol -IVF: D5-1/2NS: 125cc/h -check Mg and Phosphorus level -UDS  Elevated trop and SOB: Patient has shortness of breath, but no chest pain or cough, less likely to have pneumonia. Given her abnormal EKG, checked her trop which is elevated at 0.87. I consulted cardiology and spoke with Dr. Kennith Center on the phone, suggested to start Heparin gtt and ASA. Patient does not have any chest pain -Appreciate Dr. Marijo Sanes input -will start Heparin gtt and ASA. -check FLP -repeat EKG in AM -CXR -per Dr. Kennith Center, we need to call card again in the AM  QTc prolongation: Unclear etiology. May be related to Zoloft and alcohol -will hold zolot  Depression: no shoes side oh and a homicidal ideation -Hold Zoloft due to QTc prolongation -Repeat EKG in AM   DVT ppx: SQ Heparin     Code Status: Full code Family Communication: None at bed side.    Disposition Plan: Admit to inpatient   Date of Service 07/11/2014    Ivor Costa [facility] Pager [phone]  If 7PM-7AM, please contact night-coverage [email or web address] Password [email or web address] 07/11/2014, 3:14 AM

## 2014-07-11 ENCOUNTER — Other Ambulatory Visit (HOSPITAL_COMMUNITY): Payer: Self-pay

## 2014-07-11 ENCOUNTER — Inpatient Hospital Stay (HOSPITAL_COMMUNITY): Payer: Medicare Other

## 2014-07-11 DIAGNOSIS — R778 Other specified abnormalities of plasma proteins: Secondary | ICD-10-CM | POA: Diagnosis present

## 2014-07-11 DIAGNOSIS — I214 Non-ST elevation (NSTEMI) myocardial infarction: Secondary | ICD-10-CM

## 2014-07-11 DIAGNOSIS — I4581 Long QT syndrome: Secondary | ICD-10-CM

## 2014-07-11 DIAGNOSIS — R7989 Other specified abnormal findings of blood chemistry: Secondary | ICD-10-CM | POA: Diagnosis present

## 2014-07-11 DIAGNOSIS — I517 Cardiomegaly: Secondary | ICD-10-CM

## 2014-07-11 DIAGNOSIS — R55 Syncope and collapse: Secondary | ICD-10-CM | POA: Diagnosis present

## 2014-07-11 LAB — COMPREHENSIVE METABOLIC PANEL
ALT: 13 U/L (ref 0–35)
AST: 38 U/L — AB (ref 0–37)
Albumin: 3.3 g/dL — ABNORMAL LOW (ref 3.5–5.2)
Alkaline Phosphatase: 47 U/L (ref 39–117)
Anion gap: 14 (ref 5–15)
BUN: 23 mg/dL (ref 6–23)
CALCIUM: 8.7 mg/dL (ref 8.4–10.5)
CHLORIDE: 96 meq/L (ref 96–112)
CO2: 23 mEq/L (ref 19–32)
Creatinine, Ser: 1.08 mg/dL (ref 0.50–1.10)
GFR calc Af Amer: 59 mL/min — ABNORMAL LOW (ref >90)
GFR calc non Af Amer: 51 mL/min — ABNORMAL LOW (ref >90)
Glucose, Bld: 125 mg/dL — ABNORMAL HIGH (ref 70–99)
Potassium: 4 mEq/L (ref 3.7–5.3)
SODIUM: 133 meq/L — AB (ref 137–147)
Total Bilirubin: 0.7 mg/dL (ref 0.3–1.2)
Total Protein: 6.1 g/dL (ref 6.0–8.3)

## 2014-07-11 LAB — CBC
HCT: 32.7 % — ABNORMAL LOW (ref 36.0–46.0)
Hemoglobin: 11.4 g/dL — ABNORMAL LOW (ref 12.0–15.0)
MCH: 30.7 pg (ref 26.0–34.0)
MCHC: 34.9 g/dL (ref 30.0–36.0)
MCV: 88.1 fL (ref 78.0–100.0)
PLATELETS: 110 10*3/uL — AB (ref 150–400)
RBC: 3.71 MIL/uL — ABNORMAL LOW (ref 3.87–5.11)
RDW: 14.3 % (ref 11.5–15.5)
WBC: 6 10*3/uL (ref 4.0–10.5)

## 2014-07-11 LAB — PHOSPHORUS: PHOSPHORUS: 2.2 mg/dL — AB (ref 2.3–4.6)

## 2014-07-11 LAB — BLOOD GAS, VENOUS
Acid-Base Excess: 0.9 mmol/L (ref 0.0–2.0)
Bicarbonate: 24.5 mEq/L — ABNORMAL HIGH (ref 20.0–24.0)
FIO2: 0.21 %
O2 Saturation: 57.5 %
PATIENT TEMPERATURE: 99.3
PH VEN: 7.426 — AB (ref 7.250–7.300)
TCO2: 22.4 mmol/L (ref 0–100)
pCO2, Ven: 38.2 mmHg — ABNORMAL LOW (ref 45.0–50.0)

## 2014-07-11 LAB — PROTIME-INR
INR: 0.96 (ref 0.00–1.49)
PROTHROMBIN TIME: 12.9 s (ref 11.6–15.2)

## 2014-07-11 LAB — TROPONIN I
Troponin I: 2.38 ng/mL (ref <0.30)
Troponin I: 2.62 ng/mL (ref <0.30)

## 2014-07-11 LAB — HEPARIN LEVEL (UNFRACTIONATED)
HEPARIN UNFRACTIONATED: 0.96 [IU]/mL — AB (ref 0.30–0.70)
Heparin Unfractionated: 0.61 IU/mL (ref 0.30–0.70)

## 2014-07-11 LAB — TSH: TSH: 30.3 u[IU]/mL — ABNORMAL HIGH (ref 0.350–4.500)

## 2014-07-11 LAB — LIPID PANEL
CHOLESTEROL: 213 mg/dL — AB (ref 0–200)
HDL: 139 mg/dL (ref >39)
LDL Cholesterol: 61 mg/dL (ref 0–99)
Total CHOL/HDL Ratio: 1.5 RATIO
Triglycerides: 65 mg/dL (ref <150)
VLDL: 13 mg/dL (ref 0–40)

## 2014-07-11 LAB — MAGNESIUM: Magnesium: 1.8 mg/dL (ref 1.5–2.5)

## 2014-07-11 LAB — GLUCOSE, CAPILLARY: Glucose-Capillary: 111 mg/dL — ABNORMAL HIGH (ref 70–99)

## 2014-07-11 MED ORDER — LORAZEPAM 2 MG/ML IJ SOLN: 0.0000 mg | Freq: Two times a day (BID) | INTRAMUSCULAR | Status: DC

## 2014-07-11 MED ORDER — THIAMINE HCL 100 MG/ML IJ SOLN: 100.0000 mg | Freq: Every day | INTRAMUSCULAR | Status: DC

## 2014-07-11 MED ORDER — LEVOTHYROXINE SODIUM 175 MCG PO TABS
175.0000 ug | ORAL_TABLET | Freq: Every day | ORAL | Status: DC
  Administered 2014-07-12 – 2014-07-14 (×3): 175 ug via ORAL
  Filled 2014-07-11 (×3): qty 1
  Filled 2014-07-11: qty 2
  Filled 2014-07-11: qty 1
  Filled 2014-07-11: qty 2

## 2014-07-11 MED ORDER — LORAZEPAM 1 MG PO TABS: 1.0000 mg | ORAL_TABLET | Freq: Four times a day (QID) | ORAL | Status: DC | PRN

## 2014-07-11 MED ORDER — HEPARIN (PORCINE) IN NACL 100-0.45 UNIT/ML-% IJ SOLN
800.0000 [IU]/h | INTRAMUSCULAR | Status: DC
  Administered 2014-07-11 – 2014-07-12 (×2): 800 [IU]/h via INTRAVENOUS
  Filled 2014-07-11 (×2): qty 250

## 2014-07-11 MED ORDER — LEVOTHYROXINE SODIUM 75 MCG PO TABS: 200.0000 ug | ORAL_TABLET | Freq: Every day | ORAL | Status: DC

## 2014-07-11 MED ORDER — ASPIRIN 325 MG PO TABS
325.0000 mg | ORAL_TABLET | Freq: Every day | ORAL | Status: DC
  Administered 2014-07-11 – 2014-07-14 (×4): 325 mg via ORAL
  Filled 2014-07-11 (×4): qty 1

## 2014-07-11 MED ORDER — HALOPERIDOL LACTATE 5 MG/ML IJ SOLN
5.0000 mg | Freq: Once | INTRAMUSCULAR | Status: AC
  Administered 2014-07-12: 5 mg via INTRAMUSCULAR
  Filled 2014-07-11: qty 1

## 2014-07-11 MED ORDER — ADULT MULTIVITAMIN W/MINERALS CH
1.0000 | ORAL_TABLET | Freq: Every day | ORAL | Status: DC
  Administered 2014-07-12 – 2014-07-14 (×3): 1 via ORAL
  Filled 2014-07-11 (×3): qty 1

## 2014-07-11 MED ORDER — VITAMIN B-1 100 MG PO TABS
100.0000 mg | ORAL_TABLET | Freq: Every day | ORAL | Status: DC
  Administered 2014-07-12 – 2014-07-14 (×3): 100 mg via ORAL
  Filled 2014-07-11 (×3): qty 1

## 2014-07-11 MED ORDER — HEPARIN (PORCINE) IN NACL 100-0.45 UNIT/ML-% IJ SOLN
950.0000 [IU]/h | INTRAMUSCULAR | Status: DC
  Administered 2014-07-11: 950 [IU]/h via INTRAVENOUS
  Filled 2014-07-11: qty 250

## 2014-07-11 MED ORDER — LORAZEPAM 2 MG/ML IJ SOLN
0.0000 mg | Freq: Four times a day (QID) | INTRAMUSCULAR | Status: AC
  Administered 2014-07-12 (×2): 2 mg via INTRAVENOUS
  Administered 2014-07-12: 1 mg via INTRAVENOUS
  Administered 2014-07-12 (×2): 2 mg via INTRAVENOUS
  Filled 2014-07-11 (×5): qty 1

## 2014-07-11 MED ORDER — LORAZEPAM 2 MG/ML IJ SOLN
1.0000 mg | Freq: Four times a day (QID) | INTRAMUSCULAR | Status: DC | PRN
  Administered 2014-07-12 – 2014-07-13 (×3): 1 mg via INTRAVENOUS
  Filled 2014-07-11 (×3): qty 1

## 2014-07-11 NOTE — Progress Notes (Signed)
Utilization review completed.  

## 2014-07-11 NOTE — Progress Notes (Signed)
Echo Lab  2D Echocardiogram completed.  [place], RDCS 07/11/2014 9:23 AM

## 2014-07-11 NOTE — Progress Notes (Signed)
eLink Physician-Brief Progress Note Patient Name: Mary Graves DOB: 10-25-1945 MRN: [id number]   Date of Service  07/11/2014  HPI/Events of Note  Admited with alcoholic ketoacidosis.  Progressively more confused, and pulled out IV's.   eICU Interventions  Will give haldol 5 mg IM x one.  RN's to re-establish IV access.  Might need precedex.      Intervention Category Major Interventions: Other:  Mary Graves 07/11/2014, 11:50 PM

## 2014-07-11 NOTE — Consult Note (Signed)
CARDIOLOGY CONSULT NOTE  Patient ID: Mary Graves  MRN: [id number]  DOB: 03-01-1946  Admit date: 07/10/2014 Date of Consult: 07/11/2014  Primary Physician: Scarlette Calico, MD  Reason for Consultation: NSTEMI  History of Present Illness: Mary Graves is a 68 y.o. female with HTN, hypothyroidism, and alcoholism, who presents with abdominal pain, n/v, and syncopal events, in the setting of heavy alcohol use.  She denies chest pain but troponin is elevated and rising.  She denies prior cardiac history other than arrythmia, but is falling asleep during my history.    Past Medical History  Diagnosis Date  . Hypertension   . Hypothyroidism   . Alcoholism   . Pancreatitis     Past Surgical History  Procedure Laterality Date  . Tonsillectomy    . Knee surgery Left     x 2  . Neck surgery      bilateral facet arthrosis     Home Meds: Prior to Admission medications   Medication Sig Start Date End Date Taking? Authorizing Provider  Calcium Carbonate-Vitamin D (CALCIUM + D PO) Take 1 tablet by mouth daily.    Yes Historical Provider, MD  folic acid (FOLVITE) 1 MG tablet Take 1 tablet (1 mg total) by mouth daily. 10/04/13  Yes Velvet Bathe, MD  levothyroxine (SYNTHROID, LEVOTHROID) 175 MCG tablet TAKE ONE TABLET BY MOUTH ONCE DAILY BEFORE BREAKFAST 01/24/14  Yes Janith Lima, MD  Multiple Vitamin (MULTIVITAMIN) tablet Take 1 tablet by mouth daily.   Yes Historical Provider, MD  sertraline (ZOLOFT) 100 MG tablet Take 1 tablet (100 mg total) by mouth daily. 06/05/12  Yes Waylan Boga, NP  thiamine 100 MG tablet Take 1 tablet (100 mg total) by mouth daily. 10/04/13  Yes Velvet Bathe, MD  levothyroxine (SYNTHROID, LEVOTHROID) 175 MCG tablet TAKE ONE TABLET BY MOUTH ONCE DAILY BEFORE BREAKFAST Patient not taking: Reported on 07/10/2014 01/27/14   Janith Lima, MD    Current Medications: . aspirin  325 mg Oral Daily  . calcium-vitamin D  1 tablet Oral Daily  . folic acid  1 mg Oral Daily  .  levothyroxine  175 mcg Oral QAC breakfast  . LORazepam  0-4 mg Oral Q6H   Followed by  . [START ON 07/13/2014] LORazepam  0-4 mg Oral Q12H  . multivitamin with minerals  1 tablet Oral Daily  . pantoprazole  40 mg Oral Q1200  . sertraline  100 mg Oral Daily  . sodium chloride  3 mL Intravenous Q12H  . thiamine  100 mg Oral Daily     Allergies:    Allergies  Allergen Reactions  . Ace Inhibitors     REACTION: cough  . Folic Acid Rash    Social History:   The patient  reports that she quit smoking about 30 years ago. She has never used smokeless tobacco. She reports that she drinks about 42.0 oz of alcohol per week. She reports that she does not use illicit drugs.    Family History:   The patient's family history includes Alcohol abuse in her mother; Cancer in her other. There is no history of Heart disease, Hyperlipidemia, Hypertension, or Stroke.   ROS:  Please see the history of present illness.   All other systems reviewed and negative.   Vital Signs: Blood pressure 156/62, pulse 96, temperature 97.6 F (36.4 C), temperature source Oral, resp. rate 13, height 5' 6.5" (1.689 m), weight 62.5 kg (137 lb 12.6 oz), SpO2 96 %.   PHYSICAL  EXAM: General:  Well nourished, well developed, in no acute distress HEENT: normal Lymph: no adenopathy Neck: no JVD Endocrine:  No thryomegaly Vascular: No carotid bruits; FA pulses 2+ bilaterally without bruits Cardiac:  normal S1, S2; RRR; 2/6 systolic murmur at RUSB.  Lungs:  clear to auscultation bilaterally, no wheezing, rhonchi or rales Abd: soft, nontender, no hepatomegaly Ext: no edema Musculoskeletal:  No deformities, BUE and BLE strength normal and equal Skin: warm and dry Neuro:  CNs 2-12 intact, no focal abnormalities noted Psych:  Normal affect   EKG:  NSR with prolonged QT.   Labs:  Recent Labs  07/10/14 1546 07/10/14 2155 07/11/14 0305  TROPONINI <0.30 0.87* 2.38*   Lab Results  Component Value Date   WBC 6.0  07/11/2014   HGB 11.4* 07/11/2014   HCT 32.7* 07/11/2014   MCV 88.1 07/11/2014   PLT 110* 07/11/2014    Recent Labs Lab 07/11/14 0305  NA 133*  K 4.0  CL 96  CO2 23  BUN 23  CREATININE 1.08  CALCIUM 8.7  PROT 6.1  BILITOT 0.7  ALKPHOS 47  ALT 13  AST 38*  GLUCOSE 125*   Lab Results  Component Value Date   CHOL 212* 02/27/2013   HDL 71.50 02/27/2013   LDLCALC 94 10/17/2011   TRIG 70.0 02/27/2013   Lab Results  Component Value Date   TSH 30.300* 07/10/2014     No results found for: DDIMER  Radiology/Studies:  Ct Head Wo Contrast  07/10/2014   CLINICAL DATA:  Two witnessed syncopal episodes at a gas station. Patient refused transport by EMS. Drinking alcohol for the past 2 weeks. Vomiting this morning.  EXAM: CT HEAD WITHOUT CONTRAST  TECHNIQUE: Contiguous axial images were obtained from the base of the skull through the vertex without intravenous contrast.  COMPARISON:  03/28/2011  FINDINGS: There is no intra or extra-axial fluid collection or mass lesion. The basilar cisterns and ventricles have a normal appearance. There is no CT evidence for acute infarction or hemorrhage. Bone windows are unremarkable.  IMPRESSION: Negative exam.   Electronically Signed   By: Shon Hale M.D.   On: 07/10/2014 16:38    ASSESSMENT AND PLAN:  1. NSTEMI in the setting of alcoholic ketoacidosis and severe hypothyroid with rising troponin - Continue asprin 81mg  daily and heparin gtt. Monitor closely for bleeding in the setting of mild thrombcytopenia. - Recheck ECG this am.  - Echocardiogram to evaluate murmur and wall motion abnormalities (ordered) - As long as she remains chest pain free, favor conservative management without cath given EtOH abuse and poor medical adherence.  Would hold on starting statin, beta blocker, in the acute setting but should start prior to discharge.  Cleotis Lema 07/11/2014 5:01 AM

## 2014-07-11 NOTE — Progress Notes (Signed)
ANTICOAGULATION CONSULT NOTE - follow up  Pharmacy Consult for Heparin Indication: chest pain/ACS  Allergies  Allergen Reactions  . Ace Inhibitors     REACTION: cough  . Folic Acid Rash    Patient Measurements: Height: 5' 6.5" (168.9 cm) Weight: 137 lb 12.6 oz (62.5 kg) IBW/kg (Calculated) : 60.45 Heparin Dosing Weight:   Vital Signs: Temp: 98.4 F (36.9 C) (12/11 0800) Temp Source: Oral (12/11 0800) BP: 178/71 mmHg (12/11 1000) Pulse Rate: 89 (12/11 1000)  Labs:  Recent Labs  07/10/14 1546 07/10/14 2155 07/11/14 0305 07/11/14 0954 07/11/14 0955  HGB 12.3  --  11.4*  --   --   HCT 36.6  --  32.7*  --   --   PLT 121*  --  110*  --   --   LABPROT  --   --  12.9  --   --   INR  --   --  0.96  --   --   HEPARINUNFRC  --   --   --   --  0.96*  CREATININE 0.89  --  1.08  --   --   TROPONINI <0.30 0.87* 2.38* 2.62*  --     Estimated Creatinine Clearance: 47 mL/min (by C-G formula based on Cr of 1.08).   Medical History: Past Medical History  Diagnosis Date  . Hypertension   . Hypothyroidism   . Alcoholism   . Pancreatitis     Medications:  Infusions:  . dextrose 5 % and 0.45% NaCl 125 mL/hr at 07/11/14 0656  . heparin 950 Units/hr (07/11/14 1000)    Assessment: 68 yo female presented to ER on 12/10 with SOB, N/V and abdominal pain. PMH includes alcoholism, HTN, hypothyroidism, CKD1, dementia, depression and pancreatitis. Patient stated that she has been drinking a gallon of alcohol per day last 2 weeks. Patient found to have alcoholic ketoacidosis and elevated troponins. Was started on IV heparin early this AM 12/11 for NSTEMI. Per cards, as long as patient CP free, favor conservative management without cath given ETOH abuse and poor medical adherence. No heparin bolus was given this AM due to SQ heparin being given earlier and drip was started at rate of 950 units/hr   Heparin level supratherapeutic on current rate of 950 units/hr  Hgb stable, Plts  dropping (121 to 110) - monitor  No reported bleeding  Goal of Therapy:  Heparin level 0.3-0.7 units/ml Monitor platelets by anticoagulation protocol: Yes   Plan:  1) Reduce heparin rate from 950 units/hr to 800 units/hr 2) Recheck heparin level 6 hours after rate change 3) Daily heparin level and CBC   Adrian Saran, PharmD, BCPS Pager [phone] 07/11/2014 11:20 AM

## 2014-07-11 NOTE — Progress Notes (Signed)
PT Cancellation Note  Patient Details Name: Mary Graves MRN: [id number] DOB: 11/17/1945   Cancelled Treatment:    Reason Eval/Treat Not Completed: Medical issues which prohibited therapy (tropinin increasing. ) Will follow.    Blondell Reveal Kistler 07/11/2014, 8:38 AM  [phone]

## 2014-07-11 NOTE — Progress Notes (Signed)
ANTICOAGULATION CONSULT NOTE - Initial Consult  Pharmacy Consult for Heparin Indication: chest pain/ACS  Allergies  Allergen Reactions  . Ace Inhibitors     REACTION: cough  . Folic Acid Rash    Patient Measurements: Height: 5' 6.5" (168.9 cm) Weight: 137 lb 12.6 oz (62.5 kg) IBW/kg (Calculated) : 60.45 Heparin Dosing Weight:   Vital Signs: Temp: 97.6 F (36.4 C) (12/11 0000) Temp Source: Oral (12/11 0000) BP: 156/62 mmHg (12/11 0228) Pulse Rate: 96 (12/11 0228)  Labs:  Recent Labs  07/10/14 1546 07/10/14 2155  HGB 12.3  --   HCT 36.6  --   PLT 121*  --   CREATININE 0.89  --   TROPONINI <0.30 0.87*    Estimated Creatinine Clearance: 57 mL/min (by C-G formula based on Cr of 0.89).   Medical History: Past Medical History  Diagnosis Date  . Hypertension   . Hypothyroidism   . Alcoholism   . Pancreatitis     Medications:  Infusions:  . dextrose 5 % and 0.45% NaCl 125 mL/hr at 07/10/14 2232  . heparin 950 Units/hr (07/11/14 0200)    Assessment: Patient with NSTEMI.   5000 units sq heparin given,   Goal of Therapy:  Heparin level 0.3-0.7 units/ml Monitor platelets by anticoagulation protocol: Yes   Plan:  No bolus due to prior heparin sq given. Heparin drip at  950 units/hr Daily heparin level and CBC Next heparin level at  [place], [place] 07/11/2014,3:06 AM

## 2014-07-11 NOTE — Progress Notes (Signed)
OT Cancellation Note  Patient Details Name: Mary Graves MRN: [id number] DOB: 11/10/1945   Cancelled Treatment:    Reason Eval/Treat Not Completed: Other (comment).  Noted troponin increasing.  Will check another time.  Kesley Mullens 07/11/2014, 7:36 AM  Lesle Chris, OTR/L [phone] 07/11/2014

## 2014-07-11 NOTE — Progress Notes (Addendum)
Patient ID: Mahati Vajda, female   DOB: 1945-11-20, 68 y.o.   MRN: [id number] [facility] PROGRESS NOTE  Makahla Kiser MCN:[id number] DOB: [birth year] DOA: 07/10/2014 PCP: Scarlette Calico, MD  Brief narrative:    68 year old female with past medical history of hypertension, pancreatitis, alcohol abuse, hypothyroidism, chronic kidney disease stage I, dementia and depression who presented to [facility] ED with complaints of abdominal pain, nausea, vomiting and shortness of breath. Patient reports history of heavy drinking and last alcoholic beverage prior to the admission. Patient reported having nonbloody and nonbilious emesis prior to the admission. Her abdominal pain is mostly in upper abdominal area and epigastric area, nonradiating. On admission, blood pressure ranged 118/51 to 204/90. HR was 88 - 108, Tmax was 99.76F and oxygen saturation 94% nasal cannula oxygen support. Blood work revealed platelets of 121, CO2 15. The troponin level was initially within normal limits and then elevated at 0.87 and 2.38 subsequently. The 12-lead EKG showed normal sinus rhythm, T wave inversion in aVL. Additionally, patient was found to have ethanol level of 38. She was started on CIWA protocol. Urine drug screen was within normal limits. Urinalysis was remarkable for ketones.    Assessment/Plan:    Principal Problem:   Alcoholic ketoacidosis / acute alcohol intoxication - Admission to stepdown unit for acute alcohol intoxication and ketoacidosis. Patient was found to have ketones in urine, ethanol level 38 on the admission in addition to metabolic acidosis (CO2 15).  - Patient was started on CIWA protocol. Continue IV fluids, multivitamin, thiamine and folic acid. - Monitor for withdrawals - Continue Protonix 40 mg by mouth every 12 hours. - Considering the severity of medical conditions including alcoholic ketoacidosis, acute intoxication risk for withdrawal and STEMI we'll plan for patient to stay in step down unit  for next 24 hours.  Active Problems: NSTEMI in the setting of alcoholic ketoacidosis and severe hypothyroid with rising troponin - Appreciate cardiology consult and recommendations. - Patient is on aspirin 81 mg daily. Heparin drip started. Closely monitor for bleeding in the setting of thrombocytopenia. - Admission EKG showed normal sinus rhythm. We will repeat EKG in the morning. - Per cardiology, will hold off on starting statin therapy, beta blocker in the acute setting but it should be started prior to discharge. Hypothyroidism - TSH on it this admission 30.30. Patient is currently on Synthroid 175 g daily. In the setting of NSTEMI would not change this dose due to risk of increasing demand ischemia. Anemia of chronic disease / thrombocytopenia - Secondary to bone marrow suppression from history of alcohol abuse - Hemoglobin is 11.4 and platelet count 110. Continue to monitor CBC daily considering patient is now on heparin drip. Depression - Continue Zoloft 100 mg daily  DVT Prophylaxis  - Continue heparin drip as mentioned above  Code Status: DNR/DNI Family Communication:  plan of care discussed with the patient Disposition Plan: Patient will remain in step down unit for next 24 hours because of NSTEMI, acute alcohol intoxication.   IV access:   Peripheral IV  Procedures and diagnostic studies:    Ct Head Wo Contrast 07/10/2014   Negative exam.   Electronically Signed   By: Shon Hale M.D.   On: 07/10/2014 16:38   Medical Consultants:   Cardiology Other Consultants:   None IAnti-Infectives:    None   Leisa Lenz, MD  [facility] Pager [phone]  If 7PM-7AM, please contact night-coverage [email or web address] Password [email or web address] 07/11/2014, 10:41 AM   LOS: 1 day    HPI/Subjective:  No acute overnight events.  Objective: Filed Vitals:   07/11/14 0700 07/11/14 0800 07/11/14 0900 07/11/14 1000  BP: 156/60 152/75 161/72 178/71  Pulse: 91 89 88 89  Temp:  98.4  F (36.9 C)    TempSrc:  Oral    Resp: 14 15 19 14   Height:      Weight:      SpO2: 94% 94% 95% 97%    Intake/Output Summary (Last 24 hours) at 07/11/14 1041 Last data filed at 07/11/14 0900  Gross per 24 hour  Intake 2840.33 ml  Output    200 ml  Net 2640.33 ml    Exam:   General:  Pt is alert,  not in acute distress  Cardiovascular: Regular rate and rhythm, S1/S2 appreciated, SEM +2/6 in right upper sternal border appreciated   Respiratory: Clear to auscultation bilaterally, no wheezing, no crackles, no rhonchi  Abdomen: Soft, non tender, non distended, bowel sounds present  Extremities: No edema, pulses DP and PT palpable bilaterally  Neuro: Grossly nonfocal  Data Reviewed: Basic Metabolic Panel:  Recent Labs Lab 07/10/14 1546 07/11/14 0305  NA 137 133*  K 3.9 4.0  CL 90* 96  CO2 15* 23  GLUCOSE 69* 125*  BUN 20 23  CREATININE 0.89 1.08  CALCIUM 9.3 8.7  MG  --  1.8  PHOS  --  2.2*   Liver Function Tests:  Recent Labs Lab 07/10/14 2155 07/11/14 0305  AST 37 38*  ALT 15 13  ALKPHOS 52 47  BILITOT 0.8 0.7  PROT 6.8 6.1  ALBUMIN 3.6 3.3*    Recent Labs Lab 07/10/14 1719  LIPASE 31   No results for input(s): AMMONIA in the last 168 hours. CBC:  Recent Labs Lab 07/10/14 1546 07/11/14 0305  WBC 7.6 6.0  HGB 12.3 11.4*  HCT 36.6 32.7*  MCV 89.7 88.1  PLT 121* 110*   Cardiac Enzymes:  Recent Labs Lab 07/10/14 1546 07/10/14 2155 07/11/14 0305  TROPONINI <0.30 0.87* 2.38*   BNP: Invalid input(s): POCBNP CBG:  Recent Labs Lab 07/10/14 1651 07/10/14 1739 07/11/14 0811  GLUCAP 66* 189* 111*    Recent Results (from the past 240 hour(s))  MRSA PCR Screening     Status: None   Collection Time: 07/10/14  9:05 PM  Result Value Ref Range Status   MRSA by PCR NEGATIVE NEGATIVE Final     Scheduled Meds: . aspirin  325 mg Oral Daily  . calcium-vitamin D  1 tablet Oral Daily  . folic acid  1 mg Oral Daily  . levothyroxine   175 mcg Oral QAC breakfast  . LORazepam  0-4 mg Oral Q6H   Followed by  . [START ON 07/13/2014] LORazepam  0-4 mg Oral Q12H  . multivitamin with minerals  1 tablet Oral Daily  . pantoprazole  40 mg Oral Q1200  . sertraline  100 mg Oral Daily  . thiamine  100 mg Oral Daily   Continuous Infusions: . dextrose 5 % and 0.45% NaCl 125 mL/hr at 07/11/14 0656  . heparin 950 Units/hr (07/11/14 0900)

## 2014-07-11 NOTE — Progress Notes (Signed)
ANTICOAGULATION CONSULT NOTE - follow up  Pharmacy Consult for Heparin Indication: chest pain/ACS  Allergies  Allergen Reactions  . Ace Inhibitors     REACTION: cough  . Folic Acid Rash    Patient Measurements: Height: 5' 6.5" (168.9 cm) Weight: 137 lb 12.6 oz (62.5 kg) IBW/kg (Calculated) : 60.45 Heparin Dosing Weight:   Vital Signs: Temp: 98.6 F (37 C) (12/11 1600) Temp Source: Oral (12/11 1600) BP: 147/66 mmHg (12/11 1600) Pulse Rate: 86 (12/11 1600)  Labs:  Recent Labs  07/10/14 1546 07/10/14 2155 07/11/14 0305 07/11/14 0954 07/11/14 0955 07/11/14 1756  HGB 12.3  --  11.4*  --   --   --   HCT 36.6  --  32.7*  --   --   --   PLT 121*  --  110*  --   --   --   LABPROT  --   --  12.9  --   --   --   INR  --   --  0.96  --   --   --   HEPARINUNFRC  --   --   --   --  0.96* 0.61  CREATININE 0.89  --  1.08  --   --   --   TROPONINI <0.30 0.87* 2.38* 2.62*  --   --     Estimated Creatinine Clearance: 47 mL/min (by C-G formula based on Cr of 1.08).   Medical History: Past Medical History  Diagnosis Date  . Hypertension   . Hypothyroidism   . Alcoholism   . Pancreatitis     Medications:  Infusions:  . dextrose 5 % and 0.45% NaCl 75 mL/hr at 07/11/14 1200  . heparin 800 Units/hr (07/11/14 1600)    Assessment: 68 yo female presented to ER on 12/10 with SOB, N/V and abdominal pain. PMH includes alcoholism, HTN, hypothyroidism, CKD1, dementia, depression and pancreatitis. Patient stated that she has been drinking a gallon of alcohol per day last 2 weeks. Patient found to have alcoholic ketoacidosis and elevated troponins. Was started on IV heparin early this AM 12/11 for NSTEMI. Per cards, as long as patient CP free, favor conservative management without cath given ETOH abuse and poor medical adherence. No heparin bolus was given this AM due to SQ heparin being given earlier and drip was started at rate of 950 units/hr 12/11  Heparin level supratherapeutic  on current rate of 950 units/hr  Hgb stable, Plts dropping (121 to 110) - monitor  No reported bleeding  1756 HL=0.61, no problems per RN  Goal of Therapy:  Heparin level 0.3-0.7 units/ml Monitor platelets by anticoagulation protocol: Yes   Plan:  1) Continue heparin drip @ 800 units/hr 2) Recheck heparin level with am labs 12/12 3) Daily heparin level and CBC  Dorrene German 07/11/2014 7:38 PM

## 2014-07-11 NOTE — Progress Notes (Signed)
EEG attempted; technical issues with laptop that could not be resolved. IT remoted into computer and unable to fix. Advised nurse of the issue. 

## 2014-07-12 ENCOUNTER — Encounter (HOSPITAL_COMMUNITY): Payer: Self-pay | Admitting: *Deleted

## 2014-07-12 ENCOUNTER — Inpatient Hospital Stay (HOSPITAL_COMMUNITY): Payer: Medicare Other

## 2014-07-12 DIAGNOSIS — E876 Hypokalemia: Secondary | ICD-10-CM

## 2014-07-12 DIAGNOSIS — I959 Hypotension, unspecified: Secondary | ICD-10-CM

## 2014-07-12 DIAGNOSIS — F10231 Alcohol dependence with withdrawal delirium: Secondary | ICD-10-CM

## 2014-07-12 LAB — COMPREHENSIVE METABOLIC PANEL
ALBUMIN: 3.3 g/dL — AB (ref 3.5–5.2)
ALT: 14 U/L (ref 0–35)
AST: 41 U/L — AB (ref 0–37)
Alkaline Phosphatase: 48 U/L (ref 39–117)
Anion gap: 13 (ref 5–15)
BUN: 11 mg/dL (ref 6–23)
CALCIUM: 9.1 mg/dL (ref 8.4–10.5)
CO2: 23 mEq/L (ref 19–32)
CREATININE: 0.98 mg/dL (ref 0.50–1.10)
Chloride: 98 mEq/L (ref 96–112)
GFR calc Af Amer: 67 mL/min — ABNORMAL LOW (ref >90)
GFR calc non Af Amer: 58 mL/min — ABNORMAL LOW (ref >90)
Glucose, Bld: 104 mg/dL — ABNORMAL HIGH (ref 70–99)
Potassium: 3.6 mEq/L — ABNORMAL LOW (ref 3.7–5.3)
Sodium: 134 mEq/L — ABNORMAL LOW (ref 137–147)
Total Bilirubin: 0.4 mg/dL (ref 0.3–1.2)
Total Protein: 6.2 g/dL (ref 6.0–8.3)

## 2014-07-12 LAB — BASIC METABOLIC PANEL
ANION GAP: 13 (ref 5–15)
BUN: 8 mg/dL (ref 6–23)
CHLORIDE: 100 meq/L (ref 96–112)
CO2: 24 meq/L (ref 19–32)
Calcium: 8.5 mg/dL (ref 8.4–10.5)
Creatinine, Ser: 0.98 mg/dL (ref 0.50–1.10)
GFR calc non Af Amer: 58 mL/min — ABNORMAL LOW (ref >90)
GFR, EST AFRICAN AMERICAN: 67 mL/min — AB (ref >90)
Glucose, Bld: 132 mg/dL — ABNORMAL HIGH (ref 70–99)
POTASSIUM: 3.8 meq/L (ref 3.7–5.3)
Sodium: 137 mEq/L (ref 137–147)

## 2014-07-12 LAB — GLUCOSE, CAPILLARY: Glucose-Capillary: 102 mg/dL — ABNORMAL HIGH (ref 70–99)

## 2014-07-12 LAB — CBC
HCT: 33.6 % — ABNORMAL LOW (ref 36.0–46.0)
HCT: 31.9 % — ABNORMAL LOW (ref 36.0–46.0)
Hemoglobin: 11.6 g/dL — ABNORMAL LOW (ref 12.0–15.0)
Hemoglobin: 10.8 g/dL — ABNORMAL LOW (ref 12.0–15.0)
MCH: 31 pg (ref 26.0–34.0)
MCH: 30.7 pg (ref 26.0–34.0)
MCHC: 34.5 g/dL (ref 30.0–36.0)
MCHC: 33.9 g/dL (ref 30.0–36.0)
MCV: 89.8 fL (ref 78.0–100.0)
MCV: 90.6 fL (ref 78.0–100.0)
PLATELETS: 70 10*3/uL — AB (ref 150–400)
PLATELETS: 64 10*3/uL — AB (ref 150–400)
RBC: 3.74 MIL/uL — ABNORMAL LOW (ref 3.87–5.11)
RBC: 3.52 MIL/uL — AB (ref 3.87–5.11)
RDW: 14.1 % (ref 11.5–15.5)
RDW: 14.2 % (ref 11.5–15.5)
WBC: 5.3 10*3/uL (ref 4.0–10.5)
WBC: 6.8 10*3/uL (ref 4.0–10.5)

## 2014-07-12 LAB — HEPARIN LEVEL (UNFRACTIONATED): HEPARIN UNFRACTIONATED: 0.65 [IU]/mL (ref 0.30–0.70)

## 2014-07-12 LAB — TROPONIN I: TROPONIN I: 0.61 ng/mL — AB (ref <0.30)

## 2014-07-12 LAB — LACTATE DEHYDROGENASE: LDH: 180 U/L (ref 94–250)

## 2014-07-12 LAB — LACTIC ACID, PLASMA: Lactic Acid, Venous: 1.1 mmol/L (ref 0.5–2.2)

## 2014-07-12 LAB — SAVE SMEAR

## 2014-07-12 MED ORDER — SODIUM CHLORIDE 0.9 % IV BOLUS (SEPSIS)
1000.0000 mL | Freq: Once | INTRAVENOUS | Status: AC
  Administered 2014-07-12: 1000 mL via INTRAVENOUS

## 2014-07-12 MED ORDER — HALOPERIDOL LACTATE 5 MG/ML IJ SOLN
5.0000 mg | Freq: Four times a day (QID) | INTRAMUSCULAR | Status: DC | PRN
  Administered 2014-07-12 (×2): 5 mg via INTRAMUSCULAR
  Filled 2014-07-12 (×2): qty 1

## 2014-07-12 MED ORDER — POTASSIUM CHLORIDE CRYS ER 20 MEQ PO TBCR
40.0000 meq | EXTENDED_RELEASE_TABLET | Freq: Once | ORAL | Status: AC
  Administered 2014-07-12: 40 meq via ORAL
  Filled 2014-07-12: qty 2

## 2014-07-12 MED ORDER — ONDANSETRON HCL 4 MG/2ML IJ SOLN
INTRAMUSCULAR | Status: AC
  Filled 2014-07-12: qty 2

## 2014-07-12 MED ORDER — HYDRALAZINE HCL 20 MG/ML IJ SOLN
5.0000 mg | Freq: Four times a day (QID) | INTRAMUSCULAR | Status: DC
  Administered 2014-07-12: 5 mg via INTRAVENOUS
  Filled 2014-07-12: qty 1

## 2014-07-12 MED ORDER — ONDANSETRON HCL 4 MG/2ML IJ SOLN
4.0000 mg | Freq: Four times a day (QID) | INTRAMUSCULAR | Status: DC | PRN
  Administered 2014-07-12: 4 mg via INTRAVENOUS

## 2014-07-12 NOTE — Progress Notes (Signed)
ANTICOAGULATION CONSULT NOTE - follow up  Pharmacy Consult for Heparin Indication: chest pain/ACS  Allergies  Allergen Reactions  . Ace Inhibitors     REACTION: cough  . Folic Acid Rash    Patient Measurements: Height: 5' 6.5" (168.9 cm) Weight: 137 lb 12.6 oz (62.5 kg) IBW/kg (Calculated) : 60.45 Heparin Dosing Weight:   Vital Signs: Temp: 98.1 F (36.7 C) (12/12 1145) Temp Source: Oral (12/12 1145) BP: 179/88 mmHg (12/12 1400) Pulse Rate: 74 (12/12 1400)  Labs:  Recent Labs  07/10/14 1546 07/10/14 2155 07/11/14 0305 07/11/14 0954 07/11/14 0955 07/11/14 1756 07/12/14 0750  HGB 12.3  --  11.4*  --   --   --  11.6*  HCT 36.6  --  32.7*  --   --   --  33.6*  PLT 121*  --  110*  --   --   --  70*  LABPROT  --   --  12.9  --   --   --   --   INR  --   --  0.96  --   --   --   --   HEPARINUNFRC  --   --   --   --  0.96* 0.61 0.65  CREATININE 0.89  --  1.08  --   --   --  0.98  TROPONINI <0.30 0.87* 2.38* 2.62*  --   --   --     Estimated Creatinine Clearance: 51.7 mL/min (by C-G formula based on Cr of 0.98).   Medical History: Past Medical History  Diagnosis Date  . Hypertension   . Hypothyroidism   . Alcoholism   . Pancreatitis     Medications:  Infusions:  . dextrose 5 % and 0.45% NaCl 1,000 mL (07/12/14 0554)  . heparin 800 Units/hr (07/12/14 0110)    Assessment: 68 yo female presented to ER on 12/10 with SOB, N/V and abdominal pain. PMH includes alcoholism, HTN, hypothyroidism, CKD1, dementia, depression and pancreatitis. Patient stated that she has been drinking a gallon of alcohol per day last 2 weeks. Patient found to have alcoholic ketoacidosis and elevated troponins. Was started on IV heparin early this AM 12/11 for NSTEMI. Per cards, as long as patient CP free, favor conservative management without cath given ETOH abuse and poor medical adherence. No heparin bolus was given this AM due to SQ heparin being given earlier and drip was started at rate  of 950 units/hr on 12/ll   Heparin level therapeutic x 2 on current rate of 800 units/hr  Hgb stable, Plts continue to drop (121 to 110 to 70) - ? Discontinue IV heparin at this point with cards also signing off  No reported bleeding  Goal of Therapy:  Heparin level 0.3-0.7 units/ml Monitor platelets by anticoagulation protocol: Yes   Plan:  1) Continue heparin drip @ 800 units/hr for now 2) Recheck heparin level with am labs 12/13 3) Daily heparin level and CBC  Kara Mead 07/12/2014 2:13 PM

## 2014-07-12 NOTE — Progress Notes (Signed)
Patient very agitated and confused.

## 2014-07-12 NOTE — Progress Notes (Signed)
PT Cancellation Note  Patient Details Name: Mary Graves MRN: [id number] DOB: 1946/02/10   Cancelled Treatment:    Reason Eval/Treat Not Completed: Medical issues which prohibited therapy--C/M per RN. Also noted pt's troponin still elevated/trending up (most recent value on 12/11).  Will check back another day.    Weston Anna, MPT Pager: [phone]

## 2014-07-12 NOTE — Progress Notes (Signed)
Patient ID: Mary Graves, female   DOB: March 28, 1946, 68 y.o.   MRN: [id number]  Please see the complete cardiology note that was done earlier yesterday July 11, 2014. 2-D echo has been done. There is normal systolic left ventricular function. No further cardiac recommendations.  Cardiology will sign off.  Daryel November, MD

## 2014-07-12 NOTE — Progress Notes (Addendum)
Patient jumped out of bed to bedside commode pulling out her IV and disconnecting all of her monitoring equipment. Patient disoriented and agitated. Yelling out and hitting at staff. Got patient back to bed and paged MD on call.

## 2014-07-12 NOTE — Progress Notes (Addendum)
Patient ID: Mary Graves, female   DOB: May 30, 1946, 68 y.o.   MRN: [id number] [facility] PROGRESS NOTE  Brande Uncapher WVP:[id number] DOB: October 23, 1945 DOA: 07/10/2014 PCP: Scarlette Calico, MD  Brief narrative:    68 year old female with past medical history of hypertension, pancreatitis, alcohol abuse, hypothyroidism, chronic kidney disease stage I, dementia and depression who presented to [facility] ED with complaints of abdominal pain, nausea, vomiting and shortness of breath. Patient reports history of heavy drinking and last alcoholic beverage prior to the admission. Patient reported having nonbloody and nonbilious emesis prior to the admission. Her abdominal pain is mostly in upper abdominal area and epigastric area, nonradiating. On admission, blood pressure ranged 118/51 to 204/90. HR was 88 - 108, Tmax was 99.20F and oxygen saturation 94% nasal cannula oxygen support. Blood work revealed platelets of 121, CO2 15. The troponin level was initially within normal limits and then elevated at 0.87 and 2.38 subsequently. The 12-lead EKG showed normal sinus rhythm, T wave inversion in aVL. Additionally, patient was found to have ethanol level of 38. She was started on CIWA protocol. Urine drug screen was within normal limits. Urinalysis was remarkable for ketones.    Assessment/Plan:     Principal Problem:  Alcoholic ketoacidosis / acute alcohol intoxication / withdrawal with delirium  - Admission to stepdown unit for acute alcohol intoxication and ketoacidosis. Patient was found to have ketones in urine, ethanol level 38 on the admission in addition to metabolic acidosis (CO2 15).  - Patient was started on CIWA protocol. She became increasingly agitated and required sitter at the bedside. We will continue IV fluids, multivitamin, thiamine and folic acid. - Continue Protonix 40 mg by mouth every 12 hours. Will use ativan per CIWA and haldol as needed to control agitation from withdrawals. May need  precedex.   Active Problems: NSTEMI in the setting of alcoholic ketoacidosis and severe hypothyroid with rising troponin - Appreciate cardiology consult and recommendations. - Admission EKG showed normal sinus rhythm. - Patient is on aspirin 81 mg daily. Heparin drip started. Will d/c heparin due to worsening thrombocytopenia and risk of bleeding. - Per cardio, as long as patient is chest pain free no need for further cardiac intervention.  - Per cardiology, will hold off on starting statin therapy, beta blocker in the acute setting but it should be started prior to discharge.  Essential hypertension - added hydralazine 5 mg every 6 hours for better BP control since BP 179/88  Hypokalemia - likely due to NSTEMI - supplemented - follow up BMP in am   Hypothyroidism - TSH on it this admission 30.30. Patient is currently on Synthroid 175 g daily. In the setting of NSTEMI would not change this dose due to risk of increasing demand ischemia.  Anemia of chronic disease / thrombocytopenia - Secondary to bone marrow suppression from history of alcohol abuse - Hemoglobin is 11.6 and platelet count down to 70 form admission value of 110. Stop heparin drip.   Depression - Continue Zoloft 100 mg daily  DVT Prophylaxis  - stop heparin drip due to thrombocytopenia and risk of bleeding   Code Status: DNR/DNI Family Communication: sitter at the bedside, no family at the bedside  Disposition Plan: Patient will remain in step down unit today due to agitation and possible need for precedex   IV access:   Peripheral IV  Procedures and diagnostic studies:   Ct Head Wo Contrast 07/10/2014 Negative exam. Electronically Signed By: Shon Hale M.D. On: 07/10/2014 16:38   Dg  Chest 2 View 07/11/2014   No active cardiopulmonary disease.   Electronically Signed   By: Kerby Moors M.D.   On: 07/11/2014 14:14   Medical Consultants:   Cardiology Other Consultants:    None IAnti-Infectives:    None    Leisa Lenz, MD  [facility] Pager [phone]  If 7PM-7AM, please contact night-coverage [email or web address] Password [email or web address] 07/12/2014, 3:01 PM   LOS: 2 days    HPI/Subjective: No acute overnight events. Agitated in past 24 hours requiring sitter at the bedside.   Objective: Filed Vitals:   07/12/14 1100 07/12/14 1145 07/12/14 1200 07/12/14 1400  BP: 140/86  182/95 179/88  Pulse: 71  72 74  Temp:  98.1 F (36.7 C)    TempSrc:  Oral    Resp:      Height:      Weight:      SpO2: 95%  95% 96%    Intake/Output Summary (Last 24 hours) at 07/12/14 1501 Last data filed at 07/12/14 1500  Gross per 24 hour  Intake 2455.65 ml  Output   3725 ml  Net -1269.35 ml    Exam:   General:  Pt is drowsy, awakens briefly and has hallucinations, she is calling her borther  Cardiovascular: Regular rate and rhythm, S1/S2 appreciated, +2/6 SEM RUSB appreciated   Respiratory: Clear to auscultation bilaterally, no wheezing, no crackles, no rhonchi  Abdomen: Soft, non tender, non distended, bowel sounds present  Extremities: No edema, pulses DP and PT palpable bilaterally  Neuro: Grossly nonfocal  Data Reviewed: Basic Metabolic Panel:  Recent Labs Lab 07/10/14 1546 07/11/14 0305 07/12/14 0750  NA 137 133* 134*  K 3.9 4.0 3.6*  CL 90* 96 98  CO2 15* 23 23  GLUCOSE 69* 125* 104*  BUN 20 23 11   CREATININE 0.89 1.08 0.98  CALCIUM 9.3 8.7 9.1  MG  --  1.8  --   PHOS  --  2.2*  --    Liver Function Tests:  Recent Labs Lab 07/10/14 2155 07/11/14 0305 07/12/14 0750  AST 37 38* 41*  ALT 15 13 14   ALKPHOS 52 47 48  BILITOT 0.8 0.7 0.4  PROT 6.8 6.1 6.2  ALBUMIN 3.6 3.3* 3.3*    Recent Labs Lab 07/10/14 1719  LIPASE 31   No results for input(s): AMMONIA in the last 168 hours. CBC:  Recent Labs Lab 07/10/14 1546 07/11/14 0305 07/12/14 0750  WBC 7.6 6.0 5.3  HGB 12.3 11.4* 11.6*  HCT 36.6 32.7* 33.6*  MCV 89.7  88.1 89.8  PLT 121* 110* 70*   Cardiac Enzymes:  Recent Labs Lab 07/10/14 1546 07/10/14 2155 07/11/14 0305 07/11/14 0954  TROPONINI <0.30 0.87* 2.38* 2.62*   BNP: Invalid input(s): POCBNP CBG:  Recent Labs Lab 07/10/14 1651 07/10/14 1739 07/11/14 0811 07/12/14 0819  GLUCAP 66* 189* 111* 102*    MRSA PCR Screening     Status: None   Collection Time: 07/10/14  9:05 PM  Result Value Ref Range Status   MRSA by PCR NEGATIVE NEGATIVE Final  Culture, blood (routine x 2)     Status: None (Preliminary result)   Collection Time: 07/10/14  9:50 PM  Result Value Ref Range Status   Specimen Description BLOOD LEFT ANTECUBITAL  Final   Special Requests BOTTLES DRAWN AEROBIC ONLY 3ML  Final   Culture  Setup Time   Final   Culture   Final           BLOOD CULTURE RECEIVED NO GROWTH  TO DATE     Report Status PENDING  Incomplete  Culture, blood (routine x 2)     Status: None (Preliminary result)   Collection Time: 07/10/14  9:55 PM  Result Value Ref Range Status   Specimen Description BLOOD BLOOD LEFT FOREARM  Final   Special Requests BOTTLES DRAWN AEROBIC ONLY 4ML  Final   Culture  Setup Time   Final   Culture   Final           BLOOD CULTURE RECEIVED NO GROWTH TO DATE     Report Status PENDING  Incomplete     Scheduled Meds: . aspirin  325 mg Oral Daily  . calcium-vitamin D  1 tablet Oral Daily  . folic acid  1 mg Oral Daily  . hydrALAZINE  5 mg Intravenous Q6H  . levothyroxine  175 mcg Oral QAC breakfast  . LORazepam  0-4 mg Intravenous 4 times per day   Followed by  . [START ON 07/14/2014] LORazepam  0-4 mg Intravenous Q12H  . multivitamin with minerals  1 tablet Oral Daily  . pantoprazole  40 mg Oral Q1200  . sertraline  100 mg Oral Daily  . thiamine  100 mg Oral Daily   Or  . thiamine  100 mg Intravenous Daily   Continuous Infusions: . dextrose 5 % and 0.45% NaCl 1,000 mL (07/12/14 0554)

## 2014-07-12 NOTE — Progress Notes (Addendum)
Was called by nursing that patient hypotensive with systolic blood pressures in the 70s. Repeat manual blood pressure 90/50. Per nursing patient with no signs of overt bleeding. Patient on 5 mg IV hydralazine every 6 hours.  Vital signs: Her pressure 90/50, pulse of 86, sats 97% on room air, temperature of 77F Gen.: Patient laying in bed sleeping somewhat lethargic however answers questions and speaking in full sentences. Posey belt in place. Respiration: Lungs clear to auscultation bilaterally anterior lung fields. Cardiovascular: Regular rate rhythm with a 3/6 systolic ejection murmur. Abdomen: Soft, nontender, nondistended, positive bowel signs. Extremities: No clubbing cyanosis or edema.  Assessment/plan #1 hypotension Likely secondary to hypovolemia in the setting of antihypertensive medication of IV hydralazine. Patient noted on this admission to be in alcoholic withdrawal and on the Ativan see well protocol with when necessary Haldol for agitation. Last Haldol dose given at 6:24 PM. Patient denies any chest pain. No shortness of breath. Patient noted on admission to have a elevated troponin and 2-D echo done on 07/11/2014 with mild LVH with EF of 65% with no wall motion abnormalities. Patient has been seen by cardiology and no further cardiac workup needed. Will check a EKG. Check a chest x-ray. Check a lactic acid. Check a CBC. Check a basic metabolic profile. Check a UA with cultures and sensitivities. Will cycle cardiac enzymes every 6 hours 3. Will give a bolus of normal saline 1 L 1. Place on IV fluids normal saline at 75 mL/h. Will discontinue IV hydralazine. Follow.  #2 alcohol abuse/alcoholic ketoacidosis Patient on Ativan CIWA protocol. Continue PPI. Continue IV fluids, multivitamin, thiamine, folic acid. Haldol as needed.  #3 elevated troponins Patient noted to have elevated troponins on admission. EKG showed normal sinus rhythm. 2-D echo with mild LVH and EF of 65% with no wall  motion abnormalities. Patient has been assessed by cardiology until no further workup is needed at this time.  #4 Thrombocytopenia Patient was on heparin earlier in the hospitalization. Heparin discontinued yesterday. Check HITT panel, LDH,Haptoglobin, peripheral smaer. Follow. Monitor for bleeding.

## 2014-07-13 DIAGNOSIS — F10230 Alcohol dependence with withdrawal, uncomplicated: Secondary | ICD-10-CM

## 2014-07-13 DIAGNOSIS — I10 Essential (primary) hypertension: Secondary | ICD-10-CM

## 2014-07-13 LAB — URINALYSIS, ROUTINE W REFLEX MICROSCOPIC
Bilirubin Urine: NEGATIVE
Glucose, UA: NEGATIVE mg/dL
HGB URINE DIPSTICK: NEGATIVE
KETONES UR: NEGATIVE mg/dL
Leukocytes, UA: NEGATIVE
Nitrite: NEGATIVE
PROTEIN: NEGATIVE mg/dL
Specific Gravity, Urine: 1.006 (ref 1.005–1.030)
Urobilinogen, UA: 0.2 mg/dL (ref 0.0–1.0)
pH: 7.5 (ref 5.0–8.0)

## 2014-07-13 LAB — BASIC METABOLIC PANEL
ANION GAP: 9 (ref 5–15)
BUN: 8 mg/dL (ref 6–23)
CALCIUM: 8.8 mg/dL (ref 8.4–10.5)
CO2: 26 meq/L (ref 19–32)
Chloride: 102 mEq/L (ref 96–112)
Creatinine, Ser: 0.99 mg/dL (ref 0.50–1.10)
GFR calc Af Amer: 66 mL/min — ABNORMAL LOW (ref >90)
GFR calc non Af Amer: 57 mL/min — ABNORMAL LOW (ref >90)
Glucose, Bld: 108 mg/dL — ABNORMAL HIGH (ref 70–99)
POTASSIUM: 3.6 meq/L — AB (ref 3.7–5.3)
SODIUM: 137 meq/L (ref 137–147)

## 2014-07-13 LAB — CBC
HCT: 31.4 % — ABNORMAL LOW (ref 36.0–46.0)
HEMOGLOBIN: 10.6 g/dL — AB (ref 12.0–15.0)
MCH: 30.7 pg (ref 26.0–34.0)
MCHC: 33.8 g/dL (ref 30.0–36.0)
MCV: 91 fL (ref 78.0–100.0)
PLATELETS: 65 10*3/uL — AB (ref 150–400)
RBC: 3.45 MIL/uL — ABNORMAL LOW (ref 3.87–5.11)
RDW: 14.2 % (ref 11.5–15.5)
WBC: 6.1 10*3/uL (ref 4.0–10.5)

## 2014-07-13 LAB — TROPONIN I
TROPONIN I: 1.02 ng/mL — AB (ref <0.30)
TROPONIN I: 0.76 ng/mL — AB (ref <0.30)

## 2014-07-13 LAB — GLUCOSE, CAPILLARY: Glucose-Capillary: 99 mg/dL (ref 70–99)

## 2014-07-13 MED ORDER — POTASSIUM CHLORIDE CRYS ER 20 MEQ PO TBCR: 40.0000 meq | EXTENDED_RELEASE_TABLET | Freq: Once | ORAL | Status: DC

## 2014-07-13 MED ORDER — HYDRALAZINE HCL 20 MG/ML IJ SOLN: 5.0000 mg | Freq: Four times a day (QID) | INTRAMUSCULAR | Status: DC | PRN

## 2014-07-13 MED ORDER — POTASSIUM CHLORIDE CRYS ER 20 MEQ PO TBCR
40.0000 meq | EXTENDED_RELEASE_TABLET | Freq: Once | ORAL | Status: AC
  Administered 2014-07-13: 40 meq via ORAL
  Filled 2014-07-13: qty 2

## 2014-07-13 NOTE — Progress Notes (Signed)
Patient ID: Mary Graves, female   DOB: [birth year], 68 y.o.   MRN: [id number] [facility] PROGRESS NOTE  Parilee Hally OMV:[id number] DOB: Dec 16, 1945 DOA: 07/10/2014 PCP: Scarlette Calico, MD  Brief narrative:    68 year old female with past medical history of hypertension, pancreatitis, alcohol abuse, hypothyroidism, chronic kidney disease stage I, dementia and depression who presented to [facility] ED with complaints of abdominal pain, nausea, vomiting and shortness of breath. Patient reports history of heavy drinking and last alcoholic beverage prior to the admission. Patient reported having nonbloody and nonbilious emesis prior to the admission. Her abdominal pain is mostly in upper abdominal area and epigastric area, nonradiating. On admission, blood pressure ranged 118/51 to 204/90. HR was 88 - 108, Tmax was 99.73F and oxygen saturation 94% nasal cannula oxygen support. Blood work revealed platelets of 121, CO2 15. The troponin level was initially within normal limits and then elevated at 0.87 and 2.38 subsequently. The 12-lead EKG showed normal sinus rhythm, T wave inversion in aVL. Additionally, patient was found to have ethanol level of 38. She was started on CIWA protocol. Urine drug screen was within normal limits. Urinalysis was remarkable for ketones.    Assessment/Plan:    Principal Problem:  Alcoholic ketoacidosis / acute alcohol intoxication / withdrawal with delirium  - Admission to stepdown unit for acute alcohol intoxication and ketoacidosis. Patient was found to have ketones in urine, ethanol level 38 on the admission in addition to metabolic acidosis (CO2 15).  - Patient was started on CIWA protocol. She became increasingly agitated and required sitter at the bedside. Over past 24 hours patient has been stable. We will discontinue sitter at the bedside. We will continue CIWA protocol. - Continue multivitamin, thiamine and folic acid. We can stop IV fluids. Patient can tolerate by  mouth intake. - Patient is medically stable to be transferred to telemetry floor today.  Active Problems: NSTEMI in the setting of alcoholic ketoacidosis and severe hypothyroid with rising troponin - Appreciate cardiology consult and recommendations. - Admission EKG showed normal sinus rhythm. - Patient is on aspirin 81 mg daily. Heparin drip started but it was discontinued 07/12/2014 because of worsening thrombocytopenia and risk of bleeding. - Per cardiology, as long as patient is chest pain-free no further cardiac intervention planned. - As far as statin therapy and beta blocker therapy, per cardio it was put on hold because of acute setting but it should be started prior to discharge.  Essential hypertension - added hydralazine 5 mg every 6 hours for better BP control since BP 179/88 07/12/2014. Throughout the day patient became hypotensive so this medication was stopped. This morning blood pressure is 145/65. We'll continue to monitor off antihypertensives.  Hypokalemia - likely due to NSTEMI - supplemented  Hypothyroidism - TSH on it this admission 30.30. Patient is currently on Synthroid 175 g daily. In the setting of NSTEMI would not change this dose due to risk of increasing demand ischemia.  Anemia of chronic disease / thrombocytopenia - Secondary to bone marrow suppression from history of alcohol abuse - Hemoglobin is 11.6 and platelet count down to 65 form admission value of 110. Heparin drip started on admission and stopped 07/12/2014 because of worsening thrombocytopenia. - Monitor for signs of bleed.  Depression - Continue Zoloft 100 mg daily  DVT Prophylaxis  - stopped heparin drip due to thrombocytopenia and risk of bleeding   Code Status: DNR/DNI Family Communication: sitter at the bedside, no family at the bedside  Disposition Plan: Patient is medically stable  to be transferred to telemetry floor. She is still confused but no agitation.  IV access:    Peripheral IV  Procedures and diagnostic studies:   Ct Head Wo Contrast 07/10/2014 Negative exam. Electronically Signed By: Shon Hale M.D. On: 07/10/2014 16:38   Dg Chest 2 View 07/11/2014 No active cardiopulmonary disease. Electronically Signed By: Kerby Moors M.D. On: 07/11/2014 14:14   DG chest 07/12/2014 No active disease   Medical Consultants:   Cardiology Other Consultants:   None IAnti-Infectives:    None    Leisa Lenz, MD  [facility] Pager [phone]  If 7PM-7AM, please contact night-coverage [email or web address] Password [email or web address] 07/13/2014, 7:49 AM   LOS: 3 days    HPI/Subjective: No acute overnight events.  Objective: Filed Vitals:   07/13/14 0200 07/13/14 0230 07/13/14 0300 07/13/14 0400  BP: 137/77  145/65   Pulse: 79 81 75   Temp:    98.5 F (36.9 C)  TempSrc:    Axillary  Resp:      Height:      Weight:    64.2 kg (141 lb 8.6 oz)  SpO2:        Intake/Output Summary (Last 24 hours) at 07/13/14 0749 Last data filed at 07/13/14 0100  Gross per 24 hour  Intake 1807.67 ml  Output   1750 ml  Net  57.67 ml    Exam:   General:  Pt is not in acute distress  Cardiovascular: Regular rate and rhythm, S1/S2 (+), (+) SEM at RUSB  Respiratory: Clear to auscultation bilaterally, no wheezing, no crackles, no rhonchi  Abdomen: Soft, non tender, non distended, bowel sounds present  Extremities: No edema, pulses DP and PT palpable bilaterally  Neuro: Grossly nonfocal  Data Reviewed: Basic Metabolic Panel:  Recent Labs Lab 07/10/14 1546 07/11/14 0305 07/12/14 0750 07/12/14 2100 07/13/14 0318  NA 137 133* 134* 137 137  K 3.9 4.0 3.6* 3.8 3.6*  CL 90* 96 98 100 102  CO2 15* 23 23 24 26   GLUCOSE 69* 125* 104* 132* 108*  BUN 20 23 11 8 8   CREATININE 0.89 1.08 0.98 0.98 0.99  CALCIUM 9.3 8.7 9.1 8.5 8.8  MG  --  1.8  --   --   --   PHOS  --  2.2*  --   --   --    Liver Function Tests:  Recent  Labs Lab 07/10/14 2155 07/11/14 0305 07/12/14 0750  AST 37 38* 41*  ALT 15 13 14   ALKPHOS 52 47 48  BILITOT 0.8 0.7 0.4  PROT 6.8 6.1 6.2  ALBUMIN 3.6 3.3* 3.3*    Recent Labs Lab 07/10/14 1719  LIPASE 31   No results for input(s): AMMONIA in the last 168 hours. CBC:  Recent Labs Lab 07/10/14 1546 07/11/14 0305 07/12/14 0750 07/12/14 2100 07/13/14 0318  WBC 7.6 6.0 5.3 6.8 6.1  HGB 12.3 11.4* 11.6* 10.8* 10.6*  HCT 36.6 32.7* 33.6* 31.9* 31.4*  MCV 89.7 88.1 89.8 90.6 91.0  PLT 121* 110* 70* 64* 65*   Cardiac Enzymes:  Recent Labs Lab 07/10/14 2155 07/11/14 0305 07/11/14 0954 07/12/14 2100 07/13/14 0318  TROPONINI 0.87* 2.38* 2.62* 0.61* 1.02*   BNP: Invalid input(s): POCBNP CBG:  Recent Labs Lab 07/10/14 1651 07/10/14 1739 07/11/14 0811 07/12/14 0819  GLUCAP 66* 189* 111* 102*    MRSA PCR Screening     Status: None   Collection Time: 07/10/14  9:05 PM  Result Value Ref Range Status   MRSA by PCR  NEGATIVE NEGATIVE Final  Culture, blood (routine x 2)     Status: None (Preliminary result)   Collection Time: 07/10/14  9:50 PM  Result Value Ref Range Status   Specimen Description BLOOD LEFT ANTECUBITAL  Final   Special Requests BOTTLES DRAWN AEROBIC ONLY 3ML  Final   Culture  Setup Time   Final   Culture   Final           BLOOD CULTURE RECEIVED NO GROWTH TO DATE    Report Status PENDING  Incomplete  Culture, blood (routine x 2)     Status: None (Preliminary result)   Collection Time: 07/10/14  9:55 PM  Result Value Ref Range Status   Specimen Description BLOOD BLOOD LEFT FOREARM  Final   Special Requests BOTTLES DRAWN AEROBIC ONLY 4ML  Final   Culture  Setup Time   Final   Culture   Final           BLOOD CULTURE RECEIVED NO GROWTH TO DATE    Report Status PENDING  Incomplete     Scheduled Meds: . aspirin  325 mg Oral Daily  . calcium-vitamin D  1 tablet Oral Daily  . folic acid  1 mg Oral Daily  . levothyroxine  175 mcg Oral QAC  breakfast  . LORazepam  0-4 mg Intravenous 4 times per day   Followed by  . [START ON 07/14/2014] LORazepam  0-4 mg Intravenous Q12H  . multivitamin with minerals  1 tablet Oral Daily  . pantoprazole  40 mg Oral Q1200  . potassium chloride  40 mEq Oral Once  . sertraline  100 mg Oral Daily  . thiamine  100 mg Oral Daily   Or  . thiamine  100 mg Intravenous Daily   Continuous Infusions: . dextrose 5 % and 0.45% NaCl 100 mL/hr at 07/12/14 2010

## 2014-07-14 ENCOUNTER — Other Ambulatory Visit (HOSPITAL_COMMUNITY): Payer: Self-pay

## 2014-07-14 DIAGNOSIS — F10220 Alcohol dependence with intoxication, uncomplicated: Secondary | ICD-10-CM

## 2014-07-14 LAB — URINE CULTURE

## 2014-07-14 LAB — BASIC METABOLIC PANEL
ANION GAP: 11 (ref 5–15)
BUN: 8 mg/dL (ref 6–23)
CHLORIDE: 100 meq/L (ref 96–112)
CO2: 26 mEq/L (ref 19–32)
Calcium: 9.3 mg/dL (ref 8.4–10.5)
Creatinine, Ser: 0.97 mg/dL (ref 0.50–1.10)
GFR calc non Af Amer: 58 mL/min — ABNORMAL LOW (ref >90)
GFR, EST AFRICAN AMERICAN: 68 mL/min — AB (ref >90)
Glucose, Bld: 95 mg/dL (ref 70–99)
POTASSIUM: 3.8 meq/L (ref 3.7–5.3)
SODIUM: 137 meq/L (ref 137–147)

## 2014-07-14 LAB — CBC
HCT: 31.1 % — ABNORMAL LOW (ref 36.0–46.0)
Hemoglobin: 10.4 g/dL — ABNORMAL LOW (ref 12.0–15.0)
MCH: 30.5 pg (ref 26.0–34.0)
MCHC: 33.4 g/dL (ref 30.0–36.0)
MCV: 91.2 fL (ref 78.0–100.0)
PLATELETS: 62 10*3/uL — AB (ref 150–400)
RBC: 3.41 MIL/uL — ABNORMAL LOW (ref 3.87–5.11)
RDW: 14.2 % (ref 11.5–15.5)
WBC: 7.7 10*3/uL (ref 4.0–10.5)

## 2014-07-14 LAB — HAPTOGLOBIN: Haptoglobin: 88 mg/dL (ref 45–215)

## 2014-07-14 LAB — GLUCOSE, CAPILLARY: GLUCOSE-CAPILLARY: 87 mg/dL (ref 70–99)

## 2014-07-14 MED ORDER — PRAVASTATIN SODIUM 20 MG PO TABS: 20.0000 mg | ORAL_TABLET | Freq: Every day | ORAL | Status: DC

## 2014-07-14 MED ORDER — PANTOPRAZOLE SODIUM 40 MG PO TBEC: 40.0000 mg | DELAYED_RELEASE_TABLET | Freq: Every day | ORAL | Status: DC

## 2014-07-14 MED ORDER — METOPROLOL SUCCINATE ER 25 MG PO TB24: 25.0000 mg | ORAL_TABLET | Freq: Every day | ORAL | Status: DC

## 2014-07-14 MED ORDER — THIAMINE HCL 100 MG PO TABS: 100.0000 mg | ORAL_TABLET | Freq: Every day | ORAL | Status: DC

## 2014-07-14 NOTE — Discharge Summary (Signed)
Physician Discharge Summary  Mary Graves BOF:[id number] DOB: [birth year] DOA: 07/10/2014  PCP: Scarlette Calico, MD  Admit date: 07/10/2014 Discharge date: 07/14/2014  Recommendations for Outpatient Follow-up:  1. Please note that you had NSTEMI (provided description of it in AVS summary) during this discharge and you have been evaluated by cardiology. Since he did not have complaints of chest pain cardiology did not feel that you require cardiac catheterization. Your cardiac enzymes have trended down during this hospital stay. Cardiology recommends having 2 medications prior to discharge, beta blocker and cholesterol lowering medication - we have prescribed metoprolol 25 mg daily and Pravachol 20 mg daily. 2. Please have your primary care physician to recheck TSH in 4 weeks from discharge. Your TSH was around 20 during this hospital stay but because of NSTEMI we did not change the dose of your Synthroid.  Discharge Diagnoses:  Principal Problem:   Alcoholic ketoacidosis Active Problems:   Hypothyroidism   Essential hypertension   Major depression   HLD (hyperlipidemia)   Alcoholism with alcohol dependence   PTSD (post-traumatic stress disorder)   QT prolongation   Elevated troponin   Syncope   Hypotension    Discharge Condition: stable; patient insists on going home today.  Diet recommendation: as tolerated   History of present illness:  68 year old female with past medical history of hypertension, pancreatitis, alcohol abuse, hypothyroidism, chronic kidney disease stage I, dementia and depression who presented to [facility] ED with complaints of abdominal pain, nausea, vomiting and shortness of breath. Patient reports history of heavy drinking and last alcoholic beverage prior to the admission. Patient reported having nonbloody and nonbilious emesis prior to the admission. Her abdominal pain is mostly in upper abdominal area and epigastric area, nonradiating. On admission, blood pressure  ranged 118/51 to 204/90. HR was 88 - 108, Tmax was 99.3F and oxygen saturation 94% nasal cannula oxygen support. Blood work revealed platelets of 121, CO2 15. The troponin level was initially within normal limits and then elevated at 0.87 and 2.38 subsequently. The 12-lead EKG showed normal sinus rhythm, T wave inversion in aVL. Additionally, patient was found to have ethanol level of 38. She was started on CIWA protocol. Urine drug screen was within normal limits. Urinalysis was remarkable for ketones.    Assessment/Plan:    Principal Problem:  Alcoholic ketoacidosis / acute alcohol intoxication / withdrawal with delirium  - Admission to stepdown unit for acute alcohol intoxication and ketoacidosis. Patient was found to have ketones in urine, ethanol level 38 on the admission in addition to metabolic acidosis (CO2 15).  - Patient was started on CIWA protocol. She became increasingly agitated and required sitter at the bedside.  - Patient remains stable. She did not have a sitter at the bedside for past 48 hours. Her mental status is significantly better this morning. She is oriented to time, place and person. - Patient will continue taking thiamine and folic acid on discharge. She reports allergic reaction to multivitamin. - Patient tolerates by mouth intake. - Patient reports she will seek help with alcohol abuse on her own. She is normally attending AA meetings but has been noncompliant over the last few months. She is new to this area and sometimes feels isolated but she is not depressed. She has a sister who is providing a lot of support to her. - Patient is medically stable for discharge today. Patient wants to go home today.  Active Problems: NSTEMI in the setting of alcoholic ketoacidosis and severe hypothyroid with rising troponin -  Appreciate cardiology consult and recommendations. - Admission EKG showed normal sinus rhythm. - Patient is on aspirin 81 mg daily. Heparin drip started  but it was discontinued 07/12/2014 because of worsening thrombocytopenia and risk of bleeding. - Per cardiology, as long as patient is chest pain-free no further cardiac intervention planned. - As far as statin therapy and beta blocker therapy, per cardio it was put on hold because of acute setting but it should be started prior to discharge. We started metoprolol 25 mg daily and Pravachol 20 mg daily for easier compliance.  Essential hypertension - Patient started on metoprolol 25 mg daily.  Hypokalemia - likely due to NSTEMI - supplemented  Hypothyroidism - TSH on it this admission 30.30. Patient is currently on Synthroid 175 g daily. In the setting of NSTEMI would not change this dose due to risk of increasing demand ischemia. - Patient will have TSH rechecked in 4 weeks after discharge with primary care physician.  Anemia of chronic disease / thrombocytopenia - Secondary to bone marrow suppression from history of alcohol abuse as well as heparin drip. - Hemoglobin is 11.6 and platelet count down to 65 form admission value of 110. Heparin drip started on admission and stopped 07/12/2014 because of worsening thrombocytopenia. - Patient has no signs of bleeding. She was instructed to follow with primary care physician and recheck platelet count.  Depression - Continue Zoloft 100 mg daily  DVT Prophylaxis  - stopped heparin drip due to thrombocytopenia and risk of bleeding   Code Status: DNR/DNI Family Communication: Spoke with patient's sister over the phone who agrees with the discharge plan.   IV access:   Peripheral IV  Procedures and diagnostic studies:   Ct Head Wo Contrast 07/10/2014 Negative exam. Electronically Signed By: Shon Hale M.D. On: 07/10/2014 16:38   Dg Chest 2 View 07/11/2014 No active cardiopulmonary disease. Electronically Signed By: Kerby Moors M.D. On: 07/11/2014 14:14   DG chest 07/12/2014 No active disease    Medical Consultants:   Cardiology Other Consultants:   None IAnti-Infectives:    None    Signed:  Leisa Lenz, MD  [facility] 07/14/2014, 11:34 AM  Pager #: [phone]   Discharge Exam: Filed Vitals:   07/14/14 0524  BP: 157/88  Pulse: 81  Temp: 98.1 F (36.7 C)  Resp: 20   Filed Vitals:   07/13/14 1253 07/13/14 1319 07/13/14 2140 07/14/14 0524  BP: 156/75 155/79 159/82 157/88  Pulse: 77 86 79 81  Temp:  98 F (36.7 C) 98.1 F (36.7 C) 98.1 F (36.7 C)  TempSrc:  Oral Oral Oral  Resp:  20 18 20   Height:      Weight:      SpO2: 95% 100% 99% 100%    General: Pt is alert, follows commands appropriately, not in acute distress Cardiovascular: Regular rate and rhythm, S1/S2 +, no murmurs Respiratory: Clear to auscultation bilaterally, no wheezing, no crackles, no rhonchi Abdominal: Soft, non tender, non distended, bowel sounds +, no guarding Extremities: no edema, no cyanosis, pulses palpable bilaterally DP and PT Neuro: Grossly nonfocal  Discharge Instructions  Discharge Instructions    Call MD for:  difficulty breathing, headache or visual disturbances    Complete by:  As directed      Call MD for:  persistant dizziness or light-headedness    Complete by:  As directed      Call MD for:  persistant nausea and vomiting    Complete by:  As directed  Call MD for:  redness, tenderness, or signs of infection (pain, swelling, redness, odor or green/yellow discharge around incision site)    Complete by:  As directed      Call MD for:  severe uncontrolled pain    Complete by:  As directed      Diet - low sodium heart healthy    Complete by:  As directed      Discharge instructions    Complete by:  As directed   1. Please note that you had NSTEMI (provided description of it in AVS summary) during this discharge and you have been evaluated by cardiology. Since he did not have complaints of chest pain cardiology did not feel that you  require cardiac catheterization. Your cardiac enzymes have trended down during this hospital stay. Cardiology recommends having 2 medications prior to discharge, beta blocker and cholesterol lowering medication - we have prescribed metoprolol 25 mg daily and Pravachol 20 mg daily. 2. Please have your primary care physician to recheck TSH in 4 weeks from discharge. Your TSH was around 20 during this hospital stay but because of NSTEMI we did not change the dose of your Synthroid.     Increase activity slowly    Complete by:  As directed             Medication List    TAKE these medications        CALCIUM + D PO  Take 1 tablet by mouth daily.     folic acid 1 MG tablet  Commonly known as:  FOLVITE  Take 1 tablet (1 mg total) by mouth daily.     levothyroxine 175 MCG tablet  Commonly known as:  SYNTHROID, LEVOTHROID  TAKE ONE TABLET BY MOUTH ONCE DAILY BEFORE BREAKFAST     metoprolol succinate 25 MG 24 hr tablet  Commonly known as:  TOPROL XL  Take 1 tablet (25 mg total) by mouth daily.     multivitamin tablet  Take 1 tablet by mouth daily.     pantoprazole 40 MG tablet  Commonly known as:  PROTONIX  Take 1 tablet (40 mg total) by mouth daily at 12 noon.     pravastatin 20 MG tablet  Commonly known as:  PRAVACHOL  Take 1 tablet (20 mg total) by mouth daily.     sertraline 100 MG tablet  Commonly known as:  ZOLOFT  Take 1 tablet (100 mg total) by mouth daily.     thiamine 100 MG tablet  Take 1 tablet (100 mg total) by mouth daily.           Follow-up Information    Follow up with Scarlette Calico, MD. Schedule an appointment as soon as possible for a visit in 2 weeks.   Specialty:  Internal Medicine   Why:  Follow up appt after recent hospitalization   Contact information:   [address] [address] [phone]        The results of significant diagnostics from this hospitalization (including imaging, microbiology, ancillary and  laboratory) are listed below for reference.    Significant Diagnostic Studies: Dg Chest 2 View  07/11/2014   CLINICAL DATA:  Shortness of breath, chest pain and fever  EXAM: CHEST  2 VIEW  COMPARISON:  03/28/2011  FINDINGS: The heart size and mediastinal contours are within normal limits. Both lungs are clear. The visualized skeletal structures are unremarkable.  IMPRESSION: No active cardiopulmonary disease.   Electronically Signed   By:  Kerby Moors M.D.   On: 07/11/2014 14:14   Ct Head Wo Contrast  07/10/2014   CLINICAL DATA:  Two witnessed syncopal episodes at a gas station. Patient refused transport by EMS. Drinking alcohol for the past 2 weeks. Vomiting this morning.  EXAM: CT HEAD WITHOUT CONTRAST  TECHNIQUE: Contiguous axial images were obtained from the base of the skull through the vertex without intravenous contrast.  COMPARISON:  03/28/2011  FINDINGS: There is no intra or extra-axial fluid collection or mass lesion. The basilar cisterns and ventricles have a normal appearance. There is no CT evidence for acute infarction or hemorrhage. Bone windows are unremarkable.  IMPRESSION: Negative exam.   Electronically Signed   By: Shon Hale M.D.   On: 07/10/2014 16:38   Dg Chest Port 1 View  07/12/2014   CLINICAL DATA:  Hypotension.  Uncooperative patient.  EXAM: PORTABLE CHEST - 1 VIEW  COMPARISON:  07/11/2014  FINDINGS: The heart size and mediastinal contours are within normal limits. Both lungs are clear. The visualized skeletal structures are unremarkable.  IMPRESSION: No active disease.   Electronically Signed   By: Lucienne Capers M.D.   On: 07/12/2014 22:00    Microbiology: Recent Results (from the past 240 hour(s))  MRSA PCR Screening     Status: None   Collection Time: 07/10/14  9:05 PM  Result Value Ref Range Status   MRSA by PCR NEGATIVE NEGATIVE Final    Comment:        The GeneXpert MRSA Assay (FDA approved for NASAL specimens only), is one component of a comprehensive  MRSA colonization surveillance program. It is not intended to diagnose MRSA infection nor to guide or monitor treatment for MRSA infections.   Culture, blood (routine x 2)     Status: None (Preliminary result)   Collection Time: 07/10/14  9:50 PM  Result Value Ref Range Status   Specimen Description BLOOD LEFT ANTECUBITAL  Final   Special Requests BOTTLES DRAWN AEROBIC ONLY 3ML  Final   Culture  Setup Time   Final    07/11/2014 00:39 Performed at [facility]    Culture   Final           BLOOD CULTURE RECEIVED NO GROWTH TO DATE CULTURE WILL BE HELD FOR 5 DAYS BEFORE ISSUING A FINAL NEGATIVE REPORT Performed at [facility]    Report Status PENDING  Incomplete  Culture, blood (routine x 2)     Status: None (Preliminary result)   Collection Time: 07/10/14  9:55 PM  Result Value Ref Range Status   Specimen Description BLOOD BLOOD LEFT FOREARM  Final   Special Requests BOTTLES DRAWN AEROBIC ONLY 4ML  Final   Culture  Setup Time   Final    07/11/2014 00:39 Performed at [facility]    Culture   Final           BLOOD CULTURE RECEIVED NO GROWTH TO DATE CULTURE WILL BE HELD FOR 5 DAYS BEFORE ISSUING A FINAL NEGATIVE REPORT Performed at [facility]    Report Status PENDING  Incomplete     Labs: Basic Metabolic Panel:  Recent Labs Lab 07/11/14 0305 07/12/14 0750 07/12/14 2100 07/13/14 0318 07/14/14 0504  NA 133* 134* 137 137 137  K 4.0 3.6* 3.8 3.6* 3.8  CL 96 98 100 102 100  CO2 23 23 24 26 26   GLUCOSE 125* 104* 132* 108* 95  BUN 23 11 8 8 8   CREATININE 1.08 0.98 0.98 0.99  0.97  CALCIUM 8.7 9.1 8.5 8.8 9.3  MG 1.8  --   --   --   --   PHOS 2.2*  --   --   --   --    Liver Function Tests:  Recent Labs Lab 07/10/14 2155 07/11/14 0305 07/12/14 0750  AST 37 38* 41*  ALT 15 13 14   ALKPHOS 52 47 48  BILITOT 0.8 0.7 0.4  PROT 6.8 6.1 6.2  ALBUMIN 3.6 3.3* 3.3*    Recent Labs Lab 07/10/14 1719  LIPASE 31   No results for  input(s): AMMONIA in the last 168 hours. CBC:  Recent Labs Lab 07/11/14 0305 07/12/14 0750 07/12/14 2100 07/13/14 0318 07/14/14 0504  WBC 6.0 5.3 6.8 6.1 7.7  HGB 11.4* 11.6* 10.8* 10.6* 10.4*  HCT 32.7* 33.6* 31.9* 31.4* 31.1*  MCV 88.1 89.8 90.6 91.0 91.2  PLT 110* 70* 64* 65* 62*   Cardiac Enzymes:  Recent Labs Lab 07/11/14 0305 07/11/14 0954 07/12/14 2100 07/13/14 0318 07/13/14 0918  TROPONINI 2.38* 2.62* 0.61* 1.02* 0.76*   BNP: BNP (last 3 results) No results for input(s): PROBNP in the last 8760 hours. CBG:  Recent Labs Lab 07/10/14 1739 07/11/14 0811 07/12/14 0819 07/13/14 0820 07/14/14 0738  GLUCAP 189* 111* 102* 99 87    Time coordinating discharge: Over 30 minutes

## 2014-07-14 NOTE — Progress Notes (Addendum)
Chaplain provided support in response to spiritual care consult.    Familiar with Mary Graves from intensive outpatient program (IOP).  Mary Graves and chaplain discussed life events since discharge from IOP.  Ruey has new 1 mo. old grandson living in [place].  [name]'s daughter has not allowed her to come see grandchild.  This has been distressing, as [name]'s ex husband and other family are near new grandchild and have gotten to see him frequently.  Described feeling "left out."   Began to isolate and speaks about how this is a primary response to stress.  Spoke with chaplain about history of parent's divorce and isolation as self-protective.   Connects continued family stress to her use of ETOH when daughter was younger, divorce from daughter's father.    [name] spoke with chaplain about possibility of following up with counselor / therapist after discharge.  Described feeling depressed, but stated she does not have suicidal ideation.  Spoke about methods for being intentional about finding support / not isolating.

## 2014-07-14 NOTE — Evaluation (Signed)
Physical Therapy Evaluation Patient Details Name: Mary Graves MRN: [id number] DOB: 08/24/1945 Today's Date: 07/14/2014   History of Present Illness  68 year old female with past medical history of hypertension, pancreatitis, alcohol abuse, hypothyroidism, chronic kidney disease stage I, dementia and depression admittedwith complaints of abdominal pain, nausea, vomiting and shortness of breath. Patient reports history of heavy drinking and last alcoholic beverage prior to the admission.  Diagnosis of Alcoholic ketoacidosis / acute alcohol intoxication / withdrawal with delirium  and also with NSTEMI due to alcoholic ketoacidosis however cardiology consulted, made recommendations, and signed off.  Clinical Impression  Pt admitted with above diagnosis. Pt currently with functional limitations due to the deficits listed below (see PT Problem List).  Pt will benefit from skilled PT to increase their independence and safety with mobility to allow discharge to the venue listed below.  Pt unsteady with gait however improved with RW.  Pt reports her sister should be home tomorrow and can come assist if needed.  Pt reports she would feel better with RW at home upon d/c however declined HHPT at this time.       Follow Up Recommendations Home health PT (recommended HHPT however pt declined, feels she will be okay with RW)    Equipment Recommendations  Rolling walker with 5" wheels    Recommendations for Other Services       Precautions / Restrictions Precautions Precautions: Fall Restrictions Weight Bearing Restrictions: No      Mobility  Bed Mobility Overal bed mobility: Modified Independent                Transfers Overall transfer level: Needs assistance Equipment used: None Transfers: Sit to/from Stand Sit to Stand: Min guard         General transfer comment: unsteady upon rise however pt self corrected  Ambulation/Gait Ambulation/Gait assistance: Min guard Ambulation  Distance (Feet): 400 Feet Assistive device: 1 person hand held assist Gait Pattern/deviations: Step-through pattern;Narrow base of support     General Gait Details: pt very unsteady so provided one HHA, pt steadiness improved slightly however reports d/c home possibly today so provided RW for pt to use due to unsteadiness and pt reports not ambulating in a week and feeling shaky, pt educated in safe use of RW and to use upon d/c for safety.  Stairs            Wheelchair Mobility    Modified Rankin (Stroke Patients Only)       Balance                                             Pertinent Vitals/Pain Pain Assessment: No/denies pain  Vitals premobility: 167/83 mmHg, SpO2 100% room air, 81 bpm    Home Living Family/patient expects to be discharged to:: Private residence Living Arrangements: Alone Available Help at Discharge: Family (sister to return home 12/15 and lives nearby pt) Type of Home: Apartment Home Access: Level entry     Home Layout: One level Home Equipment: None      Prior Function Level of Independence: Independent               Hand Dominance        Extremity/Trunk Assessment               Lower Extremity Assessment: Generalized weakness         Communication  Communication: No difficulties  Cognition Arousal/Alertness: Awake/alert Behavior During Therapy: WFL for tasks assessed/performed Overall Cognitive Status: Within Functional Limits for tasks assessed                      General Comments      Exercises        Assessment/Plan    PT Assessment Patient needs continued PT services  PT Diagnosis Difficulty walking   PT Problem List Decreased strength;Decreased activity tolerance;Decreased balance;Decreased mobility;Decreased knowledge of use of DME  PT Treatment Interventions DME instruction;Neuromuscular re-education;Gait training;Balance training;Functional mobility  training;Patient/family education;Therapeutic activities;Therapeutic exercise   PT Goals (Current goals can be found in the Care Plan section) Acute Rehab PT Goals PT Goal Formulation: With patient Time For Goal Achievement: 07/21/14 Potential to Achieve Goals: Good    Frequency Min 3X/week   Barriers to discharge        Co-evaluation               End of Session   Activity Tolerance: Patient tolerated treatment well Patient left: in chair;with call bell/phone within reach;with chair alarm set           Time: 0947-1000 PT Time Calculation (min) (ACUTE ONLY): 13 min   Charges:   PT Evaluation $Initial PT Evaluation Tier I: 1 Procedure PT Treatments $Gait Training: 8-22 mins   PT G Codes:          Anelise Staron,KATHrine E 07/14/2014, 11:35 AM Carmelia Bake, PT, DPT 07/14/2014 Pager: [phone]

## 2014-07-14 NOTE — Progress Notes (Signed)
CARE MANAGEMENT NOTE 07/14/2014  Patient:  Mary Graves, Mary Graves   Account Number:  [id number]  Date Initiated:  07/14/2014  Documentation initiated by:  Edwyna Shell  Subjective/Objective Assessment:   68 yo female admitted with alcoholic ketoacidosis     Action/Plan:   discharge planning   Anticipated DC Date:  07/14/2014   Anticipated DC Plan:        [place]  CM consult      Choice offered to / List presented to:  C-1 Patient   DME arranged  Vassie Moselle      DME agency  [place] arranged  HH-1 RN  [employer].   Status of service:  Completed, signed off Medicare Important Message given?  YES (If response is "NO", the following Medicare IM given date fields will be blank) Date Medicare IM given:  07/14/2014 Medicare IM given by:  Edwyna Shell Date Additional Medicare IM given:   Additional Medicare IM given by:    Discharge Disposition:  [place]  Per UR Regulation:    If discussed at Long Length of Stay Meetings, dates discussed:    Comments:  07/14/14 Edwyna Shell RN BSN CM [phone] Patient was unable to pay the out of pocket fee for the shower chair so just the rolling walker was brought to room. Referral made to [facility] rep, Cyril Mourning for [facility] services.

## 2014-07-14 NOTE — Discharge Instructions (Addendum)
Myocardial Infarction  A myocardial infarction (MI) is damage to the heart that is not reversible. It is also called a heart attack. An MI usually occurs when a heart (coronary) artery becomes blocked or narrowed. This cuts off the blood supply to the heart. When one or more of the heart (coronary) arteries becomes blocked, that area of the heart begins to die. This causes pain felt during an MI.   If you think you might be having an MI, call your local emergency services immediately (911 in [place] It is recommended that you chew and swallow 3 non-enteric coated baby aspirin if you do not have an aspirin allergy. Do not drive yourself to the hospital or wait to see if your symptoms go away. The sooner MI is treated, the greater the amount of heart muscle saved. Time is muscle. It can save your life.  CAUSES   An MI can occur from:  · A gradual buildup of a fatty substance called plaque. When plaque builds up in the arteries, this condition is called atherosclerosis. This buildup can block or reduce the blood supply to the heart artery(s).  · A sudden plaque rupture within a heart artery that causes a blood clot (thrombus). A blood clot can block the heart artery which does not allow blood flow to the heart.  · A severe tightening (spasm) of the heart artery. This is a less common cause of a heart attack. When a heart artery spasms, it cuts off blood flow through the artery. Spasms can occur in heart arteries that do not have atherosclerosis.  RISK FACTORS  People at risk for an MI usually have one or more risk factors, such as:  · High blood pressure.  · High cholesterol.  · Smoking.  · Gender. Men have a higher heart attack risk.  · Overweight/obesity.  · Age.  · Family history.  · Lack of exercise.  · Diabetes.  · Stress.  · Excessive alcohol use.  · Street drug use (cocaine and methamphetamines).  SYMPTOMS   MI symptoms can vary, such as:  · In both men and women, MI symptoms can include the following:  ¨ Chest  pain. The chest pain may feel like a crushing, squeezing, or "pressure" type feeling. MI pain can be "referred," meaning pain can be caused in one part of the body but felt in another part of the body. Referred MI pain may occur in the left arm, neck, or jaw. Pain may even be felt in the right arm.  ¨ Shortness of breath (dyspnea).  ¨ Heartburn or indigestion with or without vomiting, shortness of breath, or sweating (diaphoresis).  ¨ Sudden, cold sweats.  ¨ Sudden lightheadedness.  ¨ Upper back pain.  · Women can have unique MI symptoms, such as:  ¨ Unexplained feelings of nervousness or anxiety.  ¨ Discomfort between the shoulder blades (scapula) or upper back.  ¨ Tingling in the hands and arms.  · In elderly people (regardless of gender), MI symptoms can be subtle, such as:  ¨ Sweating (diaphoresis).  ¨ Shortness of breath (dyspnea).  ¨ General tiredness (fatigue) or not feeling well (malaise).  DIAGNOSIS   Diagnosis of an MI involves several tests such as:  · An assessment of your vital signs such as heart rhythm, blood pressure, respiratory rate, and oxygen level.  · An EKG (ECG) to look at the electrical activity of your heart.  · Blood tests called cardiac markers are drawn at scheduled times to measure proteins   or enzymes released by the damaged heart muscle.  · A chest X-ray.  · An echocardiogram to evaluate heart motion and blood flow.  · Coronary angiography (cardiac catheterization). This is a diagnostic procedure to look at the heart arteries.  TREATMENT   Acute Intervention. For an MI, the national standard in the [place] is to have an acute intervention in under 90 minutes from the time you get to the hospital. An acute intervention is a special procedure to open up the heart arteries. It is done in a treatment room called a "catheterization lab" (cath lab). Some hospitals do no have a cath lab. If you are having an MI and the hospital does not have a cath lab, the standard is to transport you  to a hospital that has one. In the cath lab, acute intervention includes:  · Angioplasty. An angioplasty involves inserting a thin, flexible tube (catheter) into an artery in either your groin or wrist. The catheter is threaded to the heart arteries. A balloon at the end of the catheter is inflated to open a narrowed or blocked heart artery. During an angioplasty procedure, a small mesh tube (stent) may be used to keep the heart artery open. Depending on your condition and health history, one of two types of stents may be placed:  ¨ Drug-eluting stent (DES). A DES is coated with a medicine to prevent scar tissue from growing over the stent. With drug-eluting stents, blood thinning medicine will need to be taken for up to a year.  ¨ Bare metal stent. This type of stent has no special coating to keep tissue from growing over it. This type of stent is used if you cannot take blood thinning medicine for a prolonged time or you need surgery in the near future. After a bare metal stent is placed, blood thinning medicine will need to be taken for about a month.  ¨ If you are taking blood thinning medicine (anti-platelet therapy) after stent placement, do not stop taking it unless your caregiver says it is okay to do so. Make sure you understand how long you need to take the medicine.  Surgical Intervention  · If an acute intervention is not successful, surgery may be needed:  ¨ Open heart surgery (coronary artery bypass graft, CABG). CABG takes a vein (saphenous vein) from your leg. The vein is then attached to the blocked heart artery which bypasses the blockage. This then allows blood flow to the heart muscle.  Additional Interventions  · A "clot buster" medicine (thrombolytic) may be given. This medicine can help break up a clot in the heart artery. This medicine may be given if a person cannot get to a cath lab right away.  · Intra-aortic balloon pump (IABP). If you have suffered a very severe MI and are too unstable  to go to the cath lab or to surgery, an IABP may be used. This is a temporary mechanical device used to increase blood flow to the heart and reduce the workload of the heart until you are stable enough to go to the cath lab or surgery.  HOME CARE INSTRUCTIONS  After an MI, you may need the following:  · Medicine. Take medicine as directed by your caregiver. Medicines after an MI may:  ¨ Keep your blood from clotting easily (blood thinners).  ¨ Control your blood pressure.  ¨ Help lower your cholesterol.  ¨ Control abnormal heart rhythms.  · Lifestyle changes. Under the guidance of your caregiver, lifestyle   changes include:  ¨ Quitting smoking, if you smoke. Your caregiver can help you quit.  ¨ Being physically active.  ¨ Maintaining a healthy weight.  ¨ Eating a heart healthy diet. A dietitian can help you learn healthy eating options.  ¨ Managing diabetes.  ¨ Reducing stress.  ¨ Limiting alcohol intake.  SEEK IMMEDIATE MEDICAL CARE IF:   · You have severe chest pain, especially if the pain is crushing or pressure-like and spreads to the arms, back, neck, or jaw. This is an emergency. Do not wait to see if the pain will go away. Get medical help at once. Call your local emergency services (911 in the [place] Do not drive yourself to the hospital.  · You have shortness of breath during rest, sleep, or with activity.  · You have sudden sweating or clammy skin.  · You feel sick to your stomach (nauseous) and throw up (vomit).  · You suddenly become lightheaded or dizzy.  · You feel your heart beating rapidly or you notice "skipped" beats.  MAKE SURE YOU:   · Understand these instructions.  · Will watch your condition.  · Will get help right away if you are not doing well or get worse.  Document Released: 07/18/2005 Document Revised: 07/23/2013 Document Reviewed: 09/20/2013  ExitCare® Patient Information ©2015 [employer]. This information is not intended to replace advice given to you by your health care provider.  Make sure you discuss any questions you have with your health care provider.

## 2014-07-14 NOTE — Progress Notes (Signed)
attempted EEG. Pt is discharged

## 2014-07-14 NOTE — Evaluation (Signed)
Occupational Therapy Evaluation Patient Details Name: Mary Graves MRN: [id number] DOB: Dec 08, 1945 Today's Date: 07/14/2014    History of Present Illness 68 year old female with past medical history of hypertension, pancreatitis, alcohol abuse, hypothyroidism, chronic kidney disease stage I, dementia and depression admittedwith complaints of abdominal pain, nausea, vomiting and shortness of breath. Patient reports history of heavy drinking and last alcoholic beverage prior to the admission.  Diagnosis of Alcoholic ketoacidosis / acute alcohol intoxication / withdrawal with delirium  and also with NSTEMI due to alcoholic ketoacidosis however cardiology consulted, made recommendations, and signed off.   Clinical Impression   Pt overall at min guard assist level. Needed min cues for safety and made recommendations for how to manage ADL safer at home. Pt reports she may d/c home today. She states her sister can assist her initially at home. Will follow on acute to progress ADL independence.     Follow Up Recommendations  No OT follow up;Supervision - Intermittent    Equipment Recommendations  None recommended by OT    Recommendations for Other Services       Precautions / Restrictions Precautions Precautions: Fall Restrictions Weight Bearing Restrictions: No      Mobility Bed Mobility                 Transfers Overall transfer level: Needs assistance Equipment used: None Transfers: Sit to/from Stand Sit to Stand: Min guard         General transfer comment: unsteady upon rise however pt self corrected    Balance                                            ADL Overall ADL's : Needs assistance/impaired Eating/Feeding: Independent;Sitting   Grooming: Wash/dry hands;Min guard;Standing   Upper Body Bathing: Set up;Sitting   Lower Body Bathing: Min guard;Sit to/from stand   Upper Body Dressing : Set up;Sitting   Lower Body Dressing: Min  guard;Sit to/from stand   Toilet Transfer: Min guard;Ambulation;RW;Regular Museum/gallery exhibitions officer and Hygiene: Min guard;Sit to/from stand       Functional mobility during ADLs: Min guard;Rolling walker General ADL Comments: pt states she feels better using walker and was able to transfer into bathroom to sit on commode. Cautioned her to not hold to walker to sit down on commode as it can tip back on her. Explained to use edge of toilet seat to help descend to toilet and with cues she did well with this. Pt stating she has a tub at home but has access to a seat. She said she wants to kneel on floor and climb over tub and was adamant about demonstrating this to OT today. When she kneeled down and lifted LE over a pillow similar to a tub height she then had difficulty bringing herself back up to standing and had to pull on the recliner. Advised pt to NOT climb over tub as she demonstrated and either sponge bathe initially or use a tubseat and step over with assist. She states her sister can help her. She declined practicing stepping over a tub today and states she is not ready for this yet and will sponge. Advised assist when she does decide to shower. Discussed pacing activity and taking rest breaks.      Vision  Perception     Praxis      Pertinent Vitals/Pain Pain Assessment: No/denies pain     Hand Dominance     Extremity/Trunk Assessment Upper Extremity Assessment Upper Extremity Assessment: Generalized weakness          Communication Communication Communication: No difficulties   Cognition Arousal/Alertness: Awake/alert Behavior During Therapy: WFL for tasks assessed/performed Overall Cognitive Status: Within Functional Limits for tasks assessed                     General Comments       Exercises       Shoulder Instructions      Home Living Family/patient expects to be discharged to:: Private  residence Living Arrangements: Alone Available Help at Discharge: Family (sister to return home 12/15 and lives nearby pt) Type of Home: Apartment Home Access: Level entry     Home Layout: One level     Bathroom Shower/Tub: Teacher, early years/pre: Standard     Home Equipment: None          Prior Functioning/Environment Level of Independence: Independent             OT Diagnosis: Generalized weakness   OT Problem List: Decreased strength;Decreased knowledge of use of DME or AE   OT Treatment/Interventions: Self-care/ADL training;Patient/family education;Therapeutic activities;DME and/or AE instruction    OT Goals(Current goals can be found in the care plan section) Acute Rehab OT Goals Patient Stated Goal: home OT Goal Formulation: With patient Time For Goal Achievement: 07/28/14 Potential to Achieve Goals: Good  OT Frequency: Min 2X/week   Barriers to D/C:            Co-evaluation              End of Session Equipment Utilized During Treatment: Rolling walker  Activity Tolerance: Patient tolerated treatment well Patient left: in chair;with call bell/phone within reach;with chair alarm set   Time: 6381-7711 OT Time Calculation (min): 22 min Charges:  OT General Charges $OT Visit: 1 Procedure OT Evaluation $Initial OT Evaluation Tier I: 1 Procedure OT Treatments $Therapeutic Activity: 8-22 mins G-Codes:    Jules Schick  [phone] 07/14/2014, 12:08 PM

## 2014-07-15 ENCOUNTER — Telehealth: Payer: Self-pay | Admitting: *Deleted

## 2014-07-15 NOTE — Telephone Encounter (Signed)
Called pt concerning TCM appt pt states she doesn't see Dr. Ronnald Ramp anymore. Pt is currently going to [employer]...Johny Chess

## 2014-07-16 LAB — HEPARIN INDUCED THROMBOCYTOPENIA PNL
HEPARIN INDUCED PLT AB: NEGATIVE
Patient O.D.: 0.018
UFH High Dose UFH H: 0 % Release
UFH Low Dose 0.1 IU/mL: 0 % Release
UFH Low Dose 0.5 IU/mL: 0 % Release
UFH SRA RESULT: NEGATIVE

## 2014-07-17 LAB — CULTURE, BLOOD (ROUTINE X 2)
CULTURE: NO GROWTH
Culture: NO GROWTH

## 2014-07-18 DIAGNOSIS — I214 Non-ST elevation (NSTEMI) myocardial infarction: Secondary | ICD-10-CM | POA: Diagnosis not present

## 2014-07-18 DIAGNOSIS — E039 Hypothyroidism, unspecified: Secondary | ICD-10-CM | POA: Diagnosis not present

## 2014-07-18 DIAGNOSIS — Z09 Encounter for follow-up examination after completed treatment for conditions other than malignant neoplasm: Secondary | ICD-10-CM | POA: Diagnosis not present

## 2014-07-18 DIAGNOSIS — G4700 Insomnia, unspecified: Secondary | ICD-10-CM | POA: Diagnosis not present

## 2014-07-18 DIAGNOSIS — L0390 Cellulitis, unspecified: Secondary | ICD-10-CM | POA: Diagnosis not present

## 2014-07-23 DIAGNOSIS — F331 Major depressive disorder, recurrent, moderate: Secondary | ICD-10-CM | POA: Diagnosis not present

## 2014-08-01 DIAGNOSIS — F10221 Alcohol dependence with intoxication delirium: Secondary | ICD-10-CM

## 2014-08-01 DIAGNOSIS — F339 Major depressive disorder, recurrent, unspecified: Secondary | ICD-10-CM

## 2014-08-01 DIAGNOSIS — F4310 Post-traumatic stress disorder, unspecified: Secondary | ICD-10-CM

## 2014-08-01 DIAGNOSIS — I214 Non-ST elevation (NSTEMI) myocardial infarction: Secondary | ICD-10-CM

## 2014-08-14 DIAGNOSIS — Z09 Encounter for follow-up examination after completed treatment for conditions other than malignant neoplasm: Secondary | ICD-10-CM | POA: Diagnosis not present

## 2014-08-14 DIAGNOSIS — L0390 Cellulitis, unspecified: Secondary | ICD-10-CM | POA: Diagnosis not present

## 2014-08-14 DIAGNOSIS — E039 Hypothyroidism, unspecified: Secondary | ICD-10-CM | POA: Diagnosis not present

## 2014-08-14 DIAGNOSIS — I214 Non-ST elevation (NSTEMI) myocardial infarction: Secondary | ICD-10-CM | POA: Diagnosis not present

## 2014-08-14 DIAGNOSIS — G4700 Insomnia, unspecified: Secondary | ICD-10-CM | POA: Diagnosis not present

## 2014-08-25 DIAGNOSIS — F331 Major depressive disorder, recurrent, moderate: Secondary | ICD-10-CM | POA: Diagnosis not present

## 2014-12-09 ENCOUNTER — Encounter (HOSPITAL_COMMUNITY): Payer: Self-pay | Admitting: Psychology

## 2015-02-24 ENCOUNTER — Other Ambulatory Visit: Payer: Self-pay

## 2015-02-24 DIAGNOSIS — Z1231 Encounter for screening mammogram for malignant neoplasm of breast: Secondary | ICD-10-CM

## 2015-03-16 ENCOUNTER — Ambulatory Visit
Admission: RE | Admit: 2015-03-16 | Discharge: 2015-03-16 | Disposition: A | Discharge status: Home / Self Care | Payer: Medicare Other | Source: Ambulatory Visit

## 2015-03-16 DIAGNOSIS — Z1231 Encounter for screening mammogram for malignant neoplasm of breast: Secondary | ICD-10-CM | POA: Diagnosis not present

## 2015-04-01 DIAGNOSIS — M5032 Other cervical disc degeneration, mid-cervical region: Secondary | ICD-10-CM | POA: Diagnosis not present

## 2015-04-01 DIAGNOSIS — M542 Cervicalgia: Secondary | ICD-10-CM | POA: Diagnosis not present

## 2015-04-07 DIAGNOSIS — E039 Hypothyroidism, unspecified: Secondary | ICD-10-CM | POA: Diagnosis not present

## 2015-04-28 DIAGNOSIS — M5032 Other cervical disc degeneration, mid-cervical region: Secondary | ICD-10-CM | POA: Diagnosis not present

## 2015-05-06 DIAGNOSIS — Z23 Encounter for immunization: Secondary | ICD-10-CM | POA: Diagnosis not present

## 2015-05-13 DIAGNOSIS — M50322 Other cervical disc degeneration at C5-C6 level: Secondary | ICD-10-CM | POA: Diagnosis not present

## 2015-05-13 DIAGNOSIS — M542 Cervicalgia: Secondary | ICD-10-CM | POA: Diagnosis not present

## 2015-07-28 DIAGNOSIS — D225 Melanocytic nevi of trunk: Secondary | ICD-10-CM | POA: Diagnosis not present

## 2015-07-28 DIAGNOSIS — Z808 Family history of malignant neoplasm of other organs or systems: Secondary | ICD-10-CM | POA: Diagnosis not present

## 2015-07-28 DIAGNOSIS — L814 Other melanin hyperpigmentation: Secondary | ICD-10-CM | POA: Diagnosis not present

## 2015-07-28 DIAGNOSIS — Z23 Encounter for immunization: Secondary | ICD-10-CM | POA: Diagnosis not present

## 2015-07-28 DIAGNOSIS — L821 Other seborrheic keratosis: Secondary | ICD-10-CM | POA: Diagnosis not present

## 2015-07-28 DIAGNOSIS — L219 Seborrheic dermatitis, unspecified: Secondary | ICD-10-CM | POA: Diagnosis not present

## 2015-07-28 DIAGNOSIS — D1801 Hemangioma of skin and subcutaneous tissue: Secondary | ICD-10-CM | POA: Diagnosis not present

## 2015-09-09 DIAGNOSIS — I10 Essential (primary) hypertension: Secondary | ICD-10-CM | POA: Diagnosis not present

## 2015-09-09 DIAGNOSIS — E039 Hypothyroidism, unspecified: Secondary | ICD-10-CM | POA: Diagnosis not present

## 2015-09-09 DIAGNOSIS — E559 Vitamin D deficiency, unspecified: Secondary | ICD-10-CM | POA: Diagnosis not present

## 2015-09-09 DIAGNOSIS — E785 Hyperlipidemia, unspecified: Secondary | ICD-10-CM | POA: Diagnosis not present

## 2015-09-11 DIAGNOSIS — E039 Hypothyroidism, unspecified: Secondary | ICD-10-CM | POA: Diagnosis not present

## 2015-09-11 DIAGNOSIS — M810 Age-related osteoporosis without current pathological fracture: Secondary | ICD-10-CM | POA: Diagnosis not present

## 2015-09-11 DIAGNOSIS — Z0000 Encounter for general adult medical examination without abnormal findings: Secondary | ICD-10-CM | POA: Diagnosis not present

## 2015-10-08 DIAGNOSIS — M810 Age-related osteoporosis without current pathological fracture: Secondary | ICD-10-CM | POA: Diagnosis not present

## 2015-10-27 DIAGNOSIS — M50322 Other cervical disc degeneration at C5-C6 level: Secondary | ICD-10-CM | POA: Diagnosis not present

## 2015-11-10 IMAGING — CR DG CHEST 2V
2 series · 2 of 2 positions shown · non-contrast
Comparison: 03/28/2011

CLINICAL DATA: Shortness of breath, chest pain and fever

EXAM:
CHEST  2 VIEW

[w chest lat]
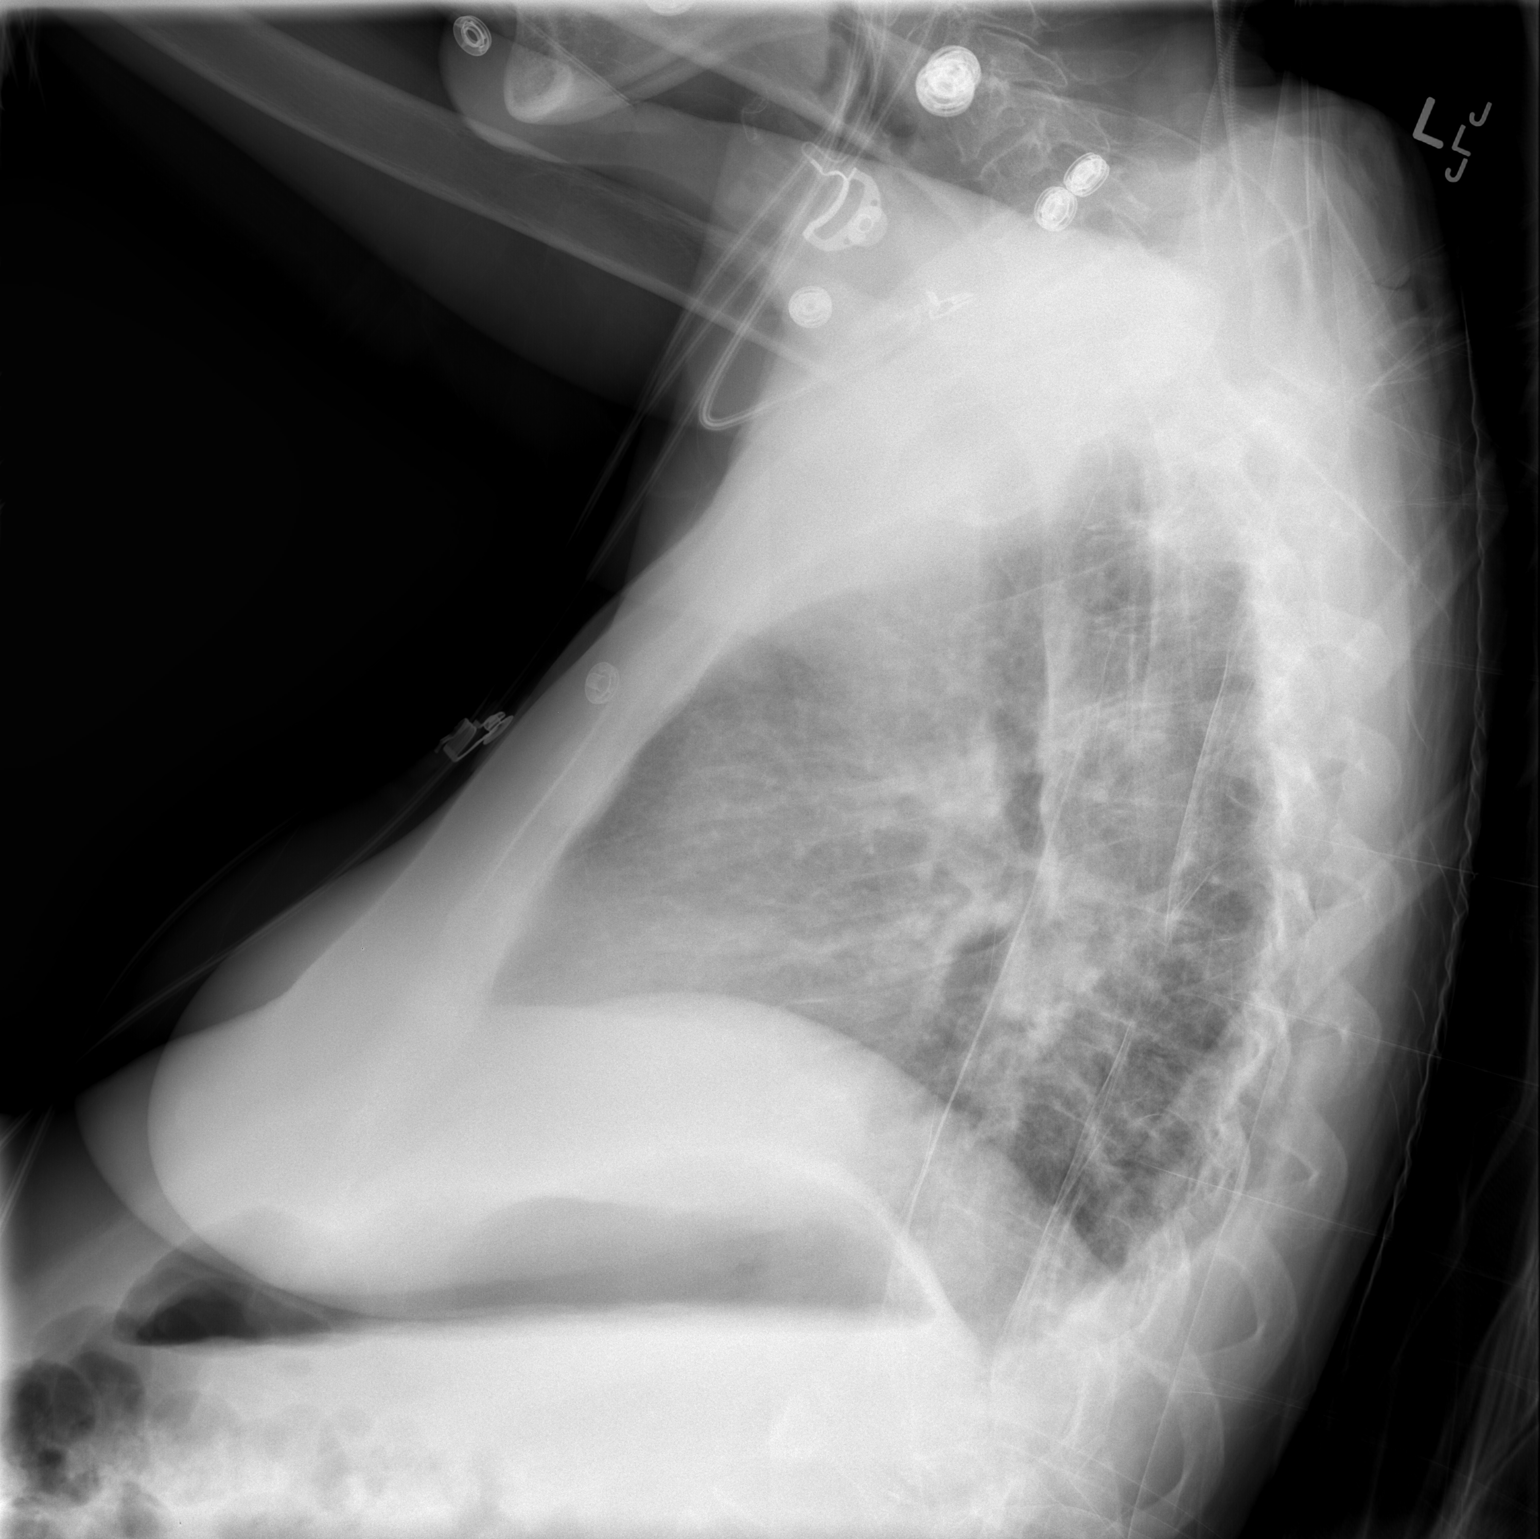

[view not recorded]
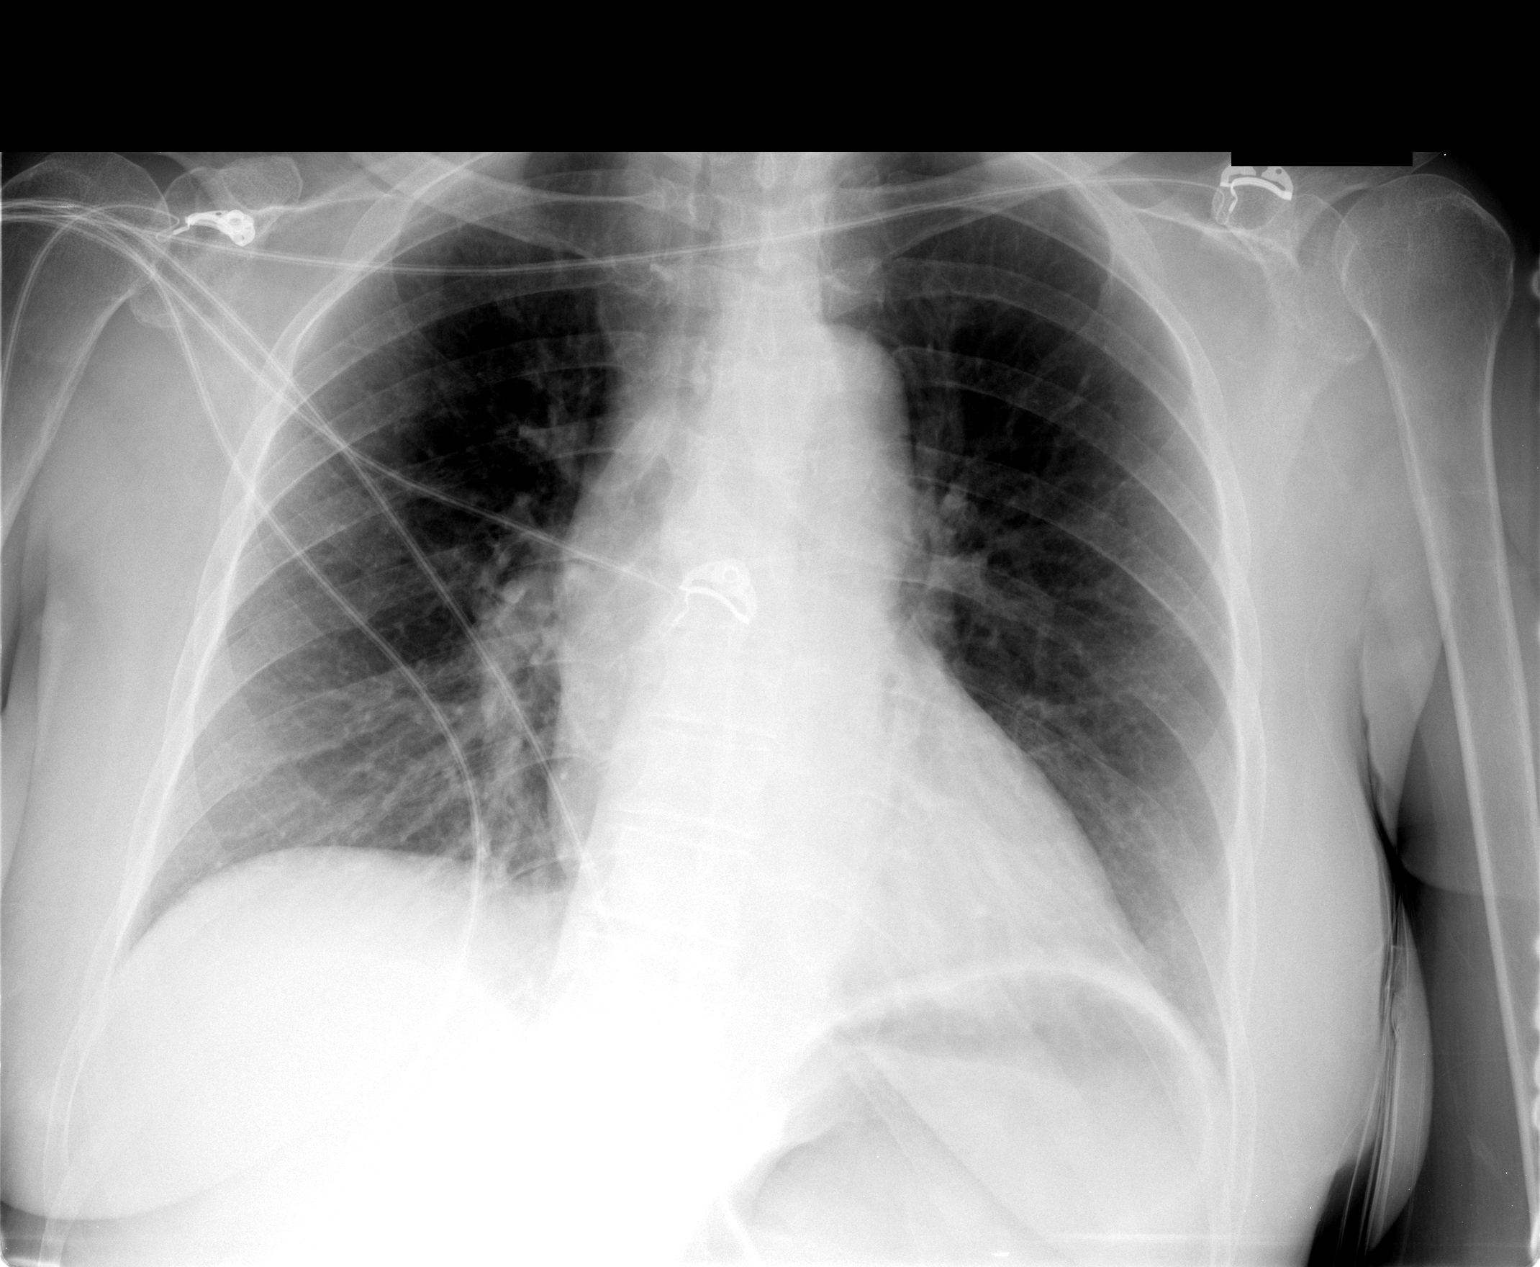

[2 of 2 positions shown; findings below may reference images not displayed]

FINDINGS: The heart size and mediastinal contours are within normal limits.
Both lungs are clear. The visualized skeletal structures are
unremarkable.
IMPRESSION: No active cardiopulmonary disease.

## 2015-12-03 DIAGNOSIS — M1712 Unilateral primary osteoarthritis, left knee: Secondary | ICD-10-CM | POA: Diagnosis not present

## 2015-12-03 DIAGNOSIS — G8929 Other chronic pain: Secondary | ICD-10-CM | POA: Diagnosis not present

## 2015-12-03 DIAGNOSIS — M25562 Pain in left knee: Secondary | ICD-10-CM | POA: Diagnosis not present

## 2015-12-03 DIAGNOSIS — M1711 Unilateral primary osteoarthritis, right knee: Secondary | ICD-10-CM | POA: Diagnosis not present

## 2015-12-03 DIAGNOSIS — M25561 Pain in right knee: Secondary | ICD-10-CM | POA: Diagnosis not present

## 2016-02-20 DIAGNOSIS — R000 Tachycardia, unspecified: Secondary | ICD-10-CM | POA: Diagnosis not present

## 2016-02-20 DIAGNOSIS — E0500 Thyrotoxicosis with diffuse goiter without thyrotoxic crisis or storm: Secondary | ICD-10-CM | POA: Diagnosis not present

## 2016-02-22 ENCOUNTER — Other Ambulatory Visit: Payer: Self-pay | Admitting: Physician Assistant

## 2016-02-22 DIAGNOSIS — Z1231 Encounter for screening mammogram for malignant neoplasm of breast: Secondary | ICD-10-CM

## 2016-02-29 DIAGNOSIS — H2513 Age-related nuclear cataract, bilateral: Secondary | ICD-10-CM | POA: Diagnosis not present

## 2016-02-29 DIAGNOSIS — H524 Presbyopia: Secondary | ICD-10-CM | POA: Diagnosis not present

## 2016-03-10 ENCOUNTER — Ambulatory Visit
Admission: RE | Admit: 2016-03-10 | Discharge: 2016-03-10 | Disposition: A | Discharge status: Home / Self Care | Payer: PPO | Source: Ambulatory Visit | Attending: Physician Assistant | Admitting: Physician Assistant

## 2016-03-10 DIAGNOSIS — Z1231 Encounter for screening mammogram for malignant neoplasm of breast: Secondary | ICD-10-CM | POA: Diagnosis not present

## 2016-03-16 ENCOUNTER — Ambulatory Visit: Payer: Self-pay

## 2016-05-09 DIAGNOSIS — E039 Hypothyroidism, unspecified: Secondary | ICD-10-CM | POA: Diagnosis not present

## 2016-05-09 DIAGNOSIS — I10 Essential (primary) hypertension: Secondary | ICD-10-CM | POA: Diagnosis not present

## 2016-08-03 DIAGNOSIS — D1801 Hemangioma of skin and subcutaneous tissue: Secondary | ICD-10-CM | POA: Diagnosis not present

## 2016-08-03 DIAGNOSIS — D225 Melanocytic nevi of trunk: Secondary | ICD-10-CM | POA: Diagnosis not present

## 2016-08-03 DIAGNOSIS — L219 Seborrheic dermatitis, unspecified: Secondary | ICD-10-CM | POA: Diagnosis not present

## 2016-08-03 DIAGNOSIS — Z23 Encounter for immunization: Secondary | ICD-10-CM | POA: Diagnosis not present

## 2016-08-03 DIAGNOSIS — Z808 Family history of malignant neoplasm of other organs or systems: Secondary | ICD-10-CM | POA: Diagnosis not present

## 2016-08-03 DIAGNOSIS — L814 Other melanin hyperpigmentation: Secondary | ICD-10-CM | POA: Diagnosis not present

## 2016-08-03 DIAGNOSIS — L821 Other seborrheic keratosis: Secondary | ICD-10-CM | POA: Diagnosis not present

## 2016-11-01 DIAGNOSIS — E785 Hyperlipidemia, unspecified: Secondary | ICD-10-CM | POA: Diagnosis not present

## 2016-11-01 DIAGNOSIS — E559 Vitamin D deficiency, unspecified: Secondary | ICD-10-CM | POA: Diagnosis not present

## 2016-11-01 DIAGNOSIS — M810 Age-related osteoporosis without current pathological fracture: Secondary | ICD-10-CM | POA: Diagnosis not present

## 2016-11-01 DIAGNOSIS — Z0000 Encounter for general adult medical examination without abnormal findings: Secondary | ICD-10-CM | POA: Diagnosis not present

## 2016-11-01 DIAGNOSIS — N183 Chronic kidney disease, stage 3 (moderate): Secondary | ICD-10-CM | POA: Diagnosis not present

## 2016-11-01 DIAGNOSIS — I10 Essential (primary) hypertension: Secondary | ICD-10-CM | POA: Diagnosis not present

## 2016-11-01 DIAGNOSIS — E039 Hypothyroidism, unspecified: Secondary | ICD-10-CM | POA: Diagnosis not present

## 2016-11-01 DIAGNOSIS — Z23 Encounter for immunization: Secondary | ICD-10-CM | POA: Diagnosis not present

## 2017-02-06 ENCOUNTER — Other Ambulatory Visit: Payer: Self-pay | Admitting: Family Medicine

## 2017-02-06 DIAGNOSIS — Z1231 Encounter for screening mammogram for malignant neoplasm of breast: Secondary | ICD-10-CM

## 2017-02-07 DIAGNOSIS — Z23 Encounter for immunization: Secondary | ICD-10-CM | POA: Diagnosis not present

## 2017-03-09 DIAGNOSIS — F1010 Alcohol abuse, uncomplicated: Secondary | ICD-10-CM | POA: Diagnosis not present

## 2017-03-09 DIAGNOSIS — F332 Major depressive disorder, recurrent severe without psychotic features: Secondary | ICD-10-CM | POA: Diagnosis not present

## 2017-03-13 ENCOUNTER — Ambulatory Visit
Admission: RE | Admit: 2017-03-13 | Discharge: 2017-03-13 | Disposition: A | Discharge status: Home / Self Care | Payer: PPO | Source: Ambulatory Visit | Attending: Family Medicine | Admitting: Family Medicine

## 2017-03-13 DIAGNOSIS — Z1231 Encounter for screening mammogram for malignant neoplasm of breast: Secondary | ICD-10-CM | POA: Diagnosis not present

## 2017-03-15 DIAGNOSIS — F1010 Alcohol abuse, uncomplicated: Secondary | ICD-10-CM | POA: Diagnosis not present

## 2017-03-15 DIAGNOSIS — F332 Major depressive disorder, recurrent severe without psychotic features: Secondary | ICD-10-CM | POA: Diagnosis not present

## 2017-04-04 DIAGNOSIS — F332 Major depressive disorder, recurrent severe without psychotic features: Secondary | ICD-10-CM | POA: Diagnosis not present

## 2017-04-04 DIAGNOSIS — F1010 Alcohol abuse, uncomplicated: Secondary | ICD-10-CM | POA: Diagnosis not present

## 2017-04-06 DIAGNOSIS — F1010 Alcohol abuse, uncomplicated: Secondary | ICD-10-CM | POA: Diagnosis not present

## 2017-04-06 DIAGNOSIS — F331 Major depressive disorder, recurrent, moderate: Secondary | ICD-10-CM | POA: Diagnosis not present

## 2017-04-18 DIAGNOSIS — F331 Major depressive disorder, recurrent, moderate: Secondary | ICD-10-CM | POA: Diagnosis not present

## 2017-04-18 DIAGNOSIS — F1010 Alcohol abuse, uncomplicated: Secondary | ICD-10-CM | POA: Diagnosis not present

## 2017-05-03 DIAGNOSIS — M791 Myalgia, unspecified site: Secondary | ICD-10-CM | POA: Diagnosis not present

## 2017-05-03 DIAGNOSIS — E785 Hyperlipidemia, unspecified: Secondary | ICD-10-CM | POA: Diagnosis not present

## 2017-05-03 DIAGNOSIS — F1010 Alcohol abuse, uncomplicated: Secondary | ICD-10-CM | POA: Diagnosis not present

## 2017-05-03 DIAGNOSIS — E039 Hypothyroidism, unspecified: Secondary | ICD-10-CM | POA: Diagnosis not present

## 2017-05-03 DIAGNOSIS — R52 Pain, unspecified: Secondary | ICD-10-CM | POA: Diagnosis not present

## 2017-05-03 DIAGNOSIS — Z23 Encounter for immunization: Secondary | ICD-10-CM | POA: Diagnosis not present

## 2017-05-03 DIAGNOSIS — F331 Major depressive disorder, recurrent, moderate: Secondary | ICD-10-CM | POA: Diagnosis not present

## 2017-05-03 DIAGNOSIS — I10 Essential (primary) hypertension: Secondary | ICD-10-CM | POA: Diagnosis not present

## 2017-05-03 DIAGNOSIS — N183 Chronic kidney disease, stage 3 (moderate): Secondary | ICD-10-CM | POA: Diagnosis not present

## 2017-05-15 DIAGNOSIS — F330 Major depressive disorder, recurrent, mild: Secondary | ICD-10-CM | POA: Diagnosis not present

## 2017-05-24 DIAGNOSIS — F330 Major depressive disorder, recurrent, mild: Secondary | ICD-10-CM | POA: Diagnosis not present

## 2017-05-31 DIAGNOSIS — H2513 Age-related nuclear cataract, bilateral: Secondary | ICD-10-CM | POA: Diagnosis not present

## 2017-05-31 DIAGNOSIS — H5203 Hypermetropia, bilateral: Secondary | ICD-10-CM | POA: Diagnosis not present

## 2017-06-12 DIAGNOSIS — F341 Dysthymic disorder: Secondary | ICD-10-CM | POA: Diagnosis not present

## 2017-06-13 DIAGNOSIS — F341 Dysthymic disorder: Secondary | ICD-10-CM | POA: Diagnosis not present

## 2017-06-15 DIAGNOSIS — Z23 Encounter for immunization: Secondary | ICD-10-CM | POA: Diagnosis not present

## 2017-06-15 DIAGNOSIS — L249 Irritant contact dermatitis, unspecified cause: Secondary | ICD-10-CM | POA: Diagnosis not present

## 2017-06-28 DIAGNOSIS — F341 Dysthymic disorder: Secondary | ICD-10-CM | POA: Diagnosis not present

## 2017-08-09 DIAGNOSIS — D225 Melanocytic nevi of trunk: Secondary | ICD-10-CM | POA: Diagnosis not present

## 2017-08-09 DIAGNOSIS — L814 Other melanin hyperpigmentation: Secondary | ICD-10-CM | POA: Diagnosis not present

## 2017-08-09 DIAGNOSIS — D1801 Hemangioma of skin and subcutaneous tissue: Secondary | ICD-10-CM | POA: Diagnosis not present

## 2017-08-09 DIAGNOSIS — L249 Irritant contact dermatitis, unspecified cause: Secondary | ICD-10-CM | POA: Diagnosis not present

## 2017-08-09 DIAGNOSIS — L821 Other seborrheic keratosis: Secondary | ICD-10-CM | POA: Diagnosis not present

## 2017-08-09 DIAGNOSIS — Z23 Encounter for immunization: Secondary | ICD-10-CM | POA: Diagnosis not present

## 2017-08-09 DIAGNOSIS — Z808 Family history of malignant neoplasm of other organs or systems: Secondary | ICD-10-CM | POA: Diagnosis not present

## 2017-08-11 DIAGNOSIS — F341 Dysthymic disorder: Secondary | ICD-10-CM | POA: Diagnosis not present

## 2017-08-25 DIAGNOSIS — F341 Dysthymic disorder: Secondary | ICD-10-CM | POA: Diagnosis not present

## 2017-09-04 DIAGNOSIS — L309 Dermatitis, unspecified: Secondary | ICD-10-CM | POA: Diagnosis not present

## 2017-09-12 DIAGNOSIS — L239 Allergic contact dermatitis, unspecified cause: Secondary | ICD-10-CM | POA: Diagnosis not present

## 2017-09-18 DIAGNOSIS — L259 Unspecified contact dermatitis, unspecified cause: Secondary | ICD-10-CM | POA: Diagnosis not present

## 2017-09-18 DIAGNOSIS — L249 Irritant contact dermatitis, unspecified cause: Secondary | ICD-10-CM | POA: Diagnosis not present

## 2017-09-18 DIAGNOSIS — Z23 Encounter for immunization: Secondary | ICD-10-CM | POA: Diagnosis not present

## 2017-11-15 DIAGNOSIS — F341 Dysthymic disorder: Secondary | ICD-10-CM | POA: Diagnosis not present

## 2017-11-22 DIAGNOSIS — M810 Age-related osteoporosis without current pathological fracture: Secondary | ICD-10-CM | POA: Diagnosis not present

## 2017-11-22 DIAGNOSIS — Z0000 Encounter for general adult medical examination without abnormal findings: Secondary | ICD-10-CM | POA: Diagnosis not present

## 2017-11-22 DIAGNOSIS — F331 Major depressive disorder, recurrent, moderate: Secondary | ICD-10-CM | POA: Diagnosis not present

## 2017-11-22 DIAGNOSIS — E559 Vitamin D deficiency, unspecified: Secondary | ICD-10-CM | POA: Diagnosis not present

## 2017-11-22 DIAGNOSIS — E0500 Thyrotoxicosis with diffuse goiter without thyrotoxic crisis or storm: Secondary | ICD-10-CM | POA: Diagnosis not present

## 2017-11-22 DIAGNOSIS — N183 Chronic kidney disease, stage 3 (moderate): Secondary | ICD-10-CM | POA: Diagnosis not present

## 2017-11-22 DIAGNOSIS — I10 Essential (primary) hypertension: Secondary | ICD-10-CM | POA: Diagnosis not present

## 2017-12-13 DIAGNOSIS — F341 Dysthymic disorder: Secondary | ICD-10-CM | POA: Diagnosis not present

## 2018-01-18 DIAGNOSIS — F341 Dysthymic disorder: Secondary | ICD-10-CM | POA: Diagnosis not present

## 2018-02-23 ENCOUNTER — Other Ambulatory Visit: Payer: Self-pay | Admitting: Family Medicine

## 2018-02-23 DIAGNOSIS — Z1231 Encounter for screening mammogram for malignant neoplasm of breast: Secondary | ICD-10-CM

## 2018-03-27 ENCOUNTER — Ambulatory Visit
Admission: RE | Admit: 2018-03-27 | Discharge: 2018-03-27 | Disposition: A | Discharge status: Home / Self Care | Payer: PPO | Source: Ambulatory Visit | Attending: Family Medicine | Admitting: Family Medicine

## 2018-03-27 DIAGNOSIS — Z1231 Encounter for screening mammogram for malignant neoplasm of breast: Secondary | ICD-10-CM

## 2018-03-27 DIAGNOSIS — F341 Dysthymic disorder: Secondary | ICD-10-CM | POA: Diagnosis not present

## 2018-04-24 DIAGNOSIS — F341 Dysthymic disorder: Secondary | ICD-10-CM | POA: Diagnosis not present

## 2018-05-25 ENCOUNTER — Other Ambulatory Visit: Payer: Self-pay | Admitting: Family Medicine

## 2018-05-25 DIAGNOSIS — M810 Age-related osteoporosis without current pathological fracture: Secondary | ICD-10-CM

## 2018-06-19 ENCOUNTER — Ambulatory Visit
Admission: RE | Admit: 2018-06-19 | Discharge: 2018-06-19 | Disposition: A | Discharge status: Home / Self Care | Payer: PPO | Source: Ambulatory Visit | Attending: Family Medicine | Admitting: Family Medicine

## 2018-06-19 DIAGNOSIS — Z780 Asymptomatic menopausal state: Secondary | ICD-10-CM | POA: Diagnosis not present

## 2018-06-19 DIAGNOSIS — M810 Age-related osteoporosis without current pathological fracture: Secondary | ICD-10-CM

## 2018-06-19 DIAGNOSIS — M8588 Other specified disorders of bone density and structure, other site: Secondary | ICD-10-CM | POA: Diagnosis not present

## 2018-07-02 DIAGNOSIS — J309 Allergic rhinitis, unspecified: Secondary | ICD-10-CM | POA: Diagnosis not present

## 2018-07-02 DIAGNOSIS — L299 Pruritus, unspecified: Secondary | ICD-10-CM | POA: Diagnosis not present

## 2018-07-02 DIAGNOSIS — R21 Rash and other nonspecific skin eruption: Secondary | ICD-10-CM | POA: Diagnosis not present

## 2018-07-02 DIAGNOSIS — J300 Vasomotor rhinitis: Secondary | ICD-10-CM | POA: Diagnosis not present

## 2018-08-09 DIAGNOSIS — Z23 Encounter for immunization: Secondary | ICD-10-CM | POA: Diagnosis not present

## 2018-08-09 DIAGNOSIS — L219 Seborrheic dermatitis, unspecified: Secondary | ICD-10-CM | POA: Diagnosis not present

## 2018-08-09 DIAGNOSIS — D225 Melanocytic nevi of trunk: Secondary | ICD-10-CM | POA: Diagnosis not present

## 2018-08-09 DIAGNOSIS — L821 Other seborrheic keratosis: Secondary | ICD-10-CM | POA: Diagnosis not present

## 2018-08-09 DIAGNOSIS — L814 Other melanin hyperpigmentation: Secondary | ICD-10-CM | POA: Diagnosis not present

## 2018-08-09 DIAGNOSIS — Z808 Family history of malignant neoplasm of other organs or systems: Secondary | ICD-10-CM | POA: Diagnosis not present

## 2019-01-15 DIAGNOSIS — F341 Dysthymic disorder: Secondary | ICD-10-CM | POA: Diagnosis not present

## 2019-02-18 ENCOUNTER — Other Ambulatory Visit: Payer: Self-pay | Admitting: Family Medicine

## 2019-02-18 DIAGNOSIS — Z1231 Encounter for screening mammogram for malignant neoplasm of breast: Secondary | ICD-10-CM

## 2019-02-25 DIAGNOSIS — N183 Chronic kidney disease, stage 3 (moderate): Secondary | ICD-10-CM | POA: Diagnosis not present

## 2019-02-25 DIAGNOSIS — E559 Vitamin D deficiency, unspecified: Secondary | ICD-10-CM | POA: Diagnosis not present

## 2019-02-25 DIAGNOSIS — E039 Hypothyroidism, unspecified: Secondary | ICD-10-CM | POA: Diagnosis not present

## 2019-02-25 DIAGNOSIS — Z1211 Encounter for screening for malignant neoplasm of colon: Secondary | ICD-10-CM | POA: Diagnosis not present

## 2019-02-25 DIAGNOSIS — J309 Allergic rhinitis, unspecified: Secondary | ICD-10-CM | POA: Diagnosis not present

## 2019-02-25 DIAGNOSIS — E0500 Thyrotoxicosis with diffuse goiter without thyrotoxic crisis or storm: Secondary | ICD-10-CM | POA: Diagnosis not present

## 2019-02-25 DIAGNOSIS — I10 Essential (primary) hypertension: Secondary | ICD-10-CM | POA: Diagnosis not present

## 2019-02-25 DIAGNOSIS — E785 Hyperlipidemia, unspecified: Secondary | ICD-10-CM | POA: Diagnosis not present

## 2019-02-25 DIAGNOSIS — M810 Age-related osteoporosis without current pathological fracture: Secondary | ICD-10-CM | POA: Diagnosis not present

## 2019-02-25 DIAGNOSIS — F419 Anxiety disorder, unspecified: Secondary | ICD-10-CM | POA: Diagnosis not present

## 2019-02-25 DIAGNOSIS — F1020 Alcohol dependence, uncomplicated: Secondary | ICD-10-CM | POA: Diagnosis not present

## 2019-02-25 DIAGNOSIS — Z0000 Encounter for general adult medical examination without abnormal findings: Secondary | ICD-10-CM | POA: Diagnosis not present

## 2019-03-25 DIAGNOSIS — M810 Age-related osteoporosis without current pathological fracture: Secondary | ICD-10-CM | POA: Diagnosis not present

## 2019-03-25 DIAGNOSIS — E0500 Thyrotoxicosis with diffuse goiter without thyrotoxic crisis or storm: Secondary | ICD-10-CM | POA: Diagnosis not present

## 2019-03-25 DIAGNOSIS — I10 Essential (primary) hypertension: Secondary | ICD-10-CM | POA: Diagnosis not present

## 2019-03-25 DIAGNOSIS — E559 Vitamin D deficiency, unspecified: Secondary | ICD-10-CM | POA: Diagnosis not present

## 2019-03-25 DIAGNOSIS — J309 Allergic rhinitis, unspecified: Secondary | ICD-10-CM | POA: Diagnosis not present

## 2019-03-25 DIAGNOSIS — E039 Hypothyroidism, unspecified: Secondary | ICD-10-CM | POA: Diagnosis not present

## 2019-03-25 DIAGNOSIS — F1020 Alcohol dependence, uncomplicated: Secondary | ICD-10-CM | POA: Diagnosis not present

## 2019-03-25 DIAGNOSIS — F419 Anxiety disorder, unspecified: Secondary | ICD-10-CM | POA: Diagnosis not present

## 2019-03-25 DIAGNOSIS — Z0000 Encounter for general adult medical examination without abnormal findings: Secondary | ICD-10-CM | POA: Diagnosis not present

## 2019-03-25 DIAGNOSIS — E785 Hyperlipidemia, unspecified: Secondary | ICD-10-CM | POA: Diagnosis not present

## 2019-03-25 DIAGNOSIS — Z1211 Encounter for screening for malignant neoplasm of colon: Secondary | ICD-10-CM | POA: Diagnosis not present

## 2019-03-25 DIAGNOSIS — N183 Chronic kidney disease, stage 3 (moderate): Secondary | ICD-10-CM | POA: Diagnosis not present

## 2019-03-26 DIAGNOSIS — M810 Age-related osteoporosis without current pathological fracture: Secondary | ICD-10-CM | POA: Diagnosis not present

## 2019-04-03 ENCOUNTER — Ambulatory Visit: Payer: PPO

## 2019-04-22 ENCOUNTER — Other Ambulatory Visit: Payer: Self-pay

## 2019-04-22 ENCOUNTER — Ambulatory Visit
Admission: RE | Admit: 2019-04-22 | Discharge: 2019-04-22 | Disposition: A | Discharge status: Home / Self Care | Payer: PPO | Source: Ambulatory Visit | Attending: Family Medicine | Admitting: Family Medicine

## 2019-04-22 DIAGNOSIS — Z1231 Encounter for screening mammogram for malignant neoplasm of breast: Secondary | ICD-10-CM | POA: Diagnosis not present

## 2019-04-26 DIAGNOSIS — M542 Cervicalgia: Secondary | ICD-10-CM | POA: Diagnosis not present

## 2019-05-09 ENCOUNTER — Ambulatory Visit: Payer: PPO | Attending: Family Medicine | Admitting: Physical Therapy

## 2019-05-09 ENCOUNTER — Encounter: Payer: Self-pay | Admitting: Physical Therapy

## 2019-05-09 ENCOUNTER — Other Ambulatory Visit: Payer: Self-pay

## 2019-05-09 DIAGNOSIS — M542 Cervicalgia: Secondary | ICD-10-CM | POA: Diagnosis not present

## 2019-05-09 DIAGNOSIS — Z1159 Encounter for screening for other viral diseases: Secondary | ICD-10-CM | POA: Diagnosis not present

## 2019-05-09 DIAGNOSIS — M6281 Muscle weakness (generalized): Secondary | ICD-10-CM | POA: Diagnosis not present

## 2019-05-09 DIAGNOSIS — R252 Cramp and spasm: Secondary | ICD-10-CM | POA: Diagnosis not present
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
## 2019-05-09 NOTE — Patient Instructions (Signed)
Access Code: [email or web address]  URL: [email or web address]  Date: 05/09/2019  Prepared by: Ruben Im   Exercises  Seated Cervical Retraction - 10 reps - 1 sets - 2x daily - 7x weekly  Supine Chin Tuck - 10 reps - 1 sets - 1x daily - 7x weekly  Seated Cervical Sidebending Stretch - 3 reps - 1 sets - 30 hold - 1x daily - 7x weekly  Neck Rotation - 3 reps - 1 sets - 30 hold - 1x daily - 7x weekly

## 2019-05-14 DIAGNOSIS — Q438 Other specified congenital malformations of intestine: Secondary | ICD-10-CM | POA: Diagnosis not present

## 2019-05-14 DIAGNOSIS — Z1211 Encounter for screening for malignant neoplasm of colon: Secondary | ICD-10-CM | POA: Diagnosis not present

## 2019-05-15 ENCOUNTER — Other Ambulatory Visit: Payer: Self-pay

## 2019-05-15 ENCOUNTER — Ambulatory Visit: Payer: PPO

## 2019-05-15 DIAGNOSIS — M542 Cervicalgia: Secondary | ICD-10-CM

## 2019-05-15 DIAGNOSIS — R252 Cramp and spasm: Secondary | ICD-10-CM

## 2019-05-15 DIAGNOSIS — M6281 Muscle weakness (generalized): Secondary | ICD-10-CM

## 2019-05-15 NOTE — Patient Instructions (Signed)
Access Code: [email or web address]  URL: [email or web address]  Date: 05/15/2019  Prepared by: Tomma Rakers   Exercises Standing Shoulder Row with Anchored Resistance - 10 reps - 1 sets - 1-2x daily - 7x weekly Shoulder Extension with Resistance - Palms Forward - 10 reps - 1 sets - 1-2x daily - 7x weekly

## 2019-05-15 NOTE — Therapy (Signed)
[facility] [address] [place], [place], [address] Phone: [phone]   Fax:  [phone]  Physical Therapy Treatment  Patient Details  Name: Mary Graves MRN: [id number] Date of Birth: 03-04-1946 Referring Provider (PT): Dr. Cari Caraway    Encounter Date: 05/15/2019  PT End of Session - 05/15/19 1418    Visit Number  2    Date for PT Re-Evaluation  07/04/19    Authorization Type  Medicare    PT Start Time  1407    PT Stop Time  1447    PT Time Calculation (min)  40 min    Activity Tolerance  Patient tolerated treatment well    Behavior During Therapy  [facility] for tasks assessed/performed       Past Medical History:  Diagnosis Date  . Alcoholism ([place])   . Hypertension   . Hypothyroidism   . Pancreatitis     Past Surgical History:  Procedure Laterality Date  . KNEE SURGERY Left    x 2  . NECK SURGERY     bilateral facet arthrosis  . TONSILLECTOMY      There were no vitals filed for this visit.  Subjective Assessment - 05/15/19 1410    Subjective  Pt reports that she feels a little bit better since her initial evaluation. She states that she really had no pain until she went on a long walk and now she is experiencing some in her neck.    Pertinent History  2015 and 2016 series of injections without lasting results; osteopenia;  sensitive to heat/cold    How long can you walk comfortably?  walk 4 miles    Diagnostic tests  MRI C5-7 bulging disc hits spinal cord sometimes    Patient Stated Goals  I would like to have a routine way of loosening my neck up on a daily basis so it won't lock up; gain upper body strength maybe something at home    Currently in Pain?  Yes    Pain Score  3     Pain Location  Neck    Pain Orientation  Right;Left    Pain Type  Chronic pain    Aggravating Factors   turning my head, walking    Pain Relieving Factors  massage 1x/month, heat, ice water bottle                        [facility] Adult PT Treatment/Exercise - 05/15/19 0001      Exercises   Exercises  Neck      Neck Exercises: Standing   Other Standing Exercises  Standing row w/Yellow theraband 10x w/VC and tactile cueing to prevent B shoulder hiking and upright posture during ther-ex     Other Standing Exercises  B shoulder extension to address lower trapezius with VC for scapular retraction to prevent compensation w/pure shoulder extension and for upright posture during ther-ex.       Neck Exercises: Seated   Neck Retraction  10 reps;5 secs    Neck Retraction Limitations  VC for keeping eyes on the horizon in order to decrease compensation w/flexion    Cervical Rotation  Both;5 reps    Cervical Rotation Limitations  5x each side within pain-free ROM    Lateral Flexion  Both    Lateral Flexion Limitations  3x 20 seconds to both side within pain-free ROM VC to avoid pain      Neck Exercises: Supine  Neck Retraction  10 reps;5 secs    Neck Retraction Limitations  VC for maintianing eyes on the horizon in order to prevent compensation with flexion.     Cervical Rotation  5 reps;Left    Cervical Rotation Limitations  in supine w/towel for PROM to the L stretching the R with VC to avoid painful stretching or movements.       Manual Therapy   Manual Therapy  Soft tissue mobilization    Soft tissue mobilization  to the B SCM, middle/anterior scalenes w/R>L.  suboccipital release Bil with greater tightness noted on the L, Trigger points throughout the R SCM; decreased following light STM and TpR             PT Education - 05/15/19 1456    Education Details  Access Code: [email or web address], pt other access code for cervical retraction in sitting and supine with side bending and rotation does not work from the therapist website but does from the patient portal. Pt was educated to add 2 resistance exercises to current HEP. Discussed the importance of neutral head position during  resistance bands in order to decrease stress on the cervical spine.    Person(s) Educated  Patient    Methods  Explanation;Demonstration;Tactile cues;Verbal cues;Handout   hand out e-mailed due sign in is not connected to the printer   Comprehension  Verbalized understanding;Returned demonstration       PT Short Term Goals - 05/09/19 1656      PT SHORT TERM GOAL #1   Title  The patient will demonstrate knowledge of initial HEP and basic self care including postural correction    Time  4    Period  Weeks    Status  New    Target Date  06/06/19      PT SHORT TERM GOAL #2   Title  The patient will report a 30% reduction in neck pain with walking and driving    Time  4    Period  Weeks    Status  New      PT SHORT TERM GOAL #3   Title  The patient will have improved left sidebending ROM to 25 degrees and left rotation to 30 degrees needed for driving.    Time  4    Period  Weeks    Status  New      PT SHORT TERM GOAL #4   Title  The patient will have improved FOTO functional outcome score to 45% indicating improved function with less pain    Time  4    Period  Weeks    Status  New        PT Long Term Goals - 05/09/19 1702      PT LONG TERM GOAL #1   Title  The patient will be independent with a HEP for cervical ROM, shoulder and scapular muscle strengthening program    Time  8    Period  Weeks    Status  New    Target Date  07/04/19      PT LONG TERM GOAL #2   Title  The patient will report a 60% improvement in neck pain with walking and driving    Time  8    Period  Weeks    Status  New      PT LONG TERM GOAL #3   Title  Cervical flexion and extension ROM improved to 40 degrees, bil rotation to 45 degrees needed for driving  Time  8    Period  Weeks    Status  New      PT LONG TERM GOAL #4   Title  The patient will have improved cervical and periscapular strength to 4/5 needed for lifting and carrying light to medium objects    Time  8    Period  Weeks     Status  New      PT LONG TERM GOAL #5   Title  FOTO functional outcome score improved to 40% indicating improved function with less pain    Time  8    Period  Weeks    Status  New            Plan - 05/15/19 1410    Clinical Impression Statement  Pt presents to physical therapy with continued stiffness and pain in her cervical spine with visibly decreased cervical ROM in rotation and side bending. Pt was able to tolerate an increase in scapular stabilization this session with light resistance. She has good form with chin tuck when demonstrated from HEP. Palpable tightness/tenderness noted at the cervical musculature; decreased slightly following light STM and TpR. Pt will benefit from continued POC.    Personal Factors and Comorbidities  Age;Past/Current Experience;Comorbidity 1;Comorbidity 2;Comorbidity 3+    Comorbidities  Grave's disease sensitive to heat/cold;  osteopenia;  HTN; depression per medical record; hearing and visual impairments    Examination-Activity Limitations  Lift;Stand;Other;Locomotion Level;Carry    Examination-Participation Restrictions  Meal Prep;Driving;Community Activity;Volunteer    Rehab Potential  Good    PT Frequency  2x / week    PT Duration  8 weeks    PT Treatment/Interventions  ADLs/Self Care Home Management;Electrical Stimulation;Cryotherapy;Ultrasound;Traction;Moist Heat;Neuromuscular re-education;Therapeutic exercise;Therapeutic activities;Patient/family education;Manual techniques;Taping;Dry needling    PT Next Visit Plan  patient interested in DN; modalities as needed but monitor heat/cold secondary to sensitivity, Assess band exercises. continue with STM    PT Home Exercise Plan  Access Code: [email or web address] URL: [email or web address] Date: 05/09/2019 (you can only access this one from the pt portal on medbridge).    Consulted and Agree with Plan of Care  Patient       Patient will benefit from skilled therapeutic intervention in order to  improve the following deficits and impairments:  Decreased range of motion, Increased muscle spasms, Pain, Decreased strength, Postural dysfunction  Visit Diagnosis: Cervicalgia  Cramp and spasm  Muscle weakness (generalized)     Problem List Patient Active Problem List   Diagnosis Date Noted  . Elevated troponin 07/11/2014  . Syncope 07/11/2014  . PTSD (post-traumatic stress disorder) 10/31/2013  . Alcoholism in family 10/31/2013  . Dementia ([place]) 10/09/2013  . HLD (hyperlipidemia) 02/27/2013  . Osteopenia 02/27/2013  . Alcohol dependence ([place]) 06/01/2012  . Major depression 06/01/2012  . Hypothyroidism 02/23/2009  . Essential hypertension 02/23/2009  . HYPERGLYCEMIA 02/23/2009    Ander Purpura, PT 05/15/2019, 3:37 PM  [facility] [address] [place], [place], [address] Phone: [phone]   Fax:  [phone]  Name: Meilany Nippert MRN: [id number] Date of Birth: Sep 24, 1945

## 2019-05-17 ENCOUNTER — Ambulatory Visit: Payer: PPO | Admitting: Physical Therapy

## 2019-05-17 ENCOUNTER — Other Ambulatory Visit: Payer: Self-pay

## 2019-05-17 DIAGNOSIS — M542 Cervicalgia: Secondary | ICD-10-CM

## 2019-05-17 DIAGNOSIS — M6281 Muscle weakness (generalized): Secondary | ICD-10-CM

## 2019-05-17 DIAGNOSIS — R252 Cramp and spasm: Secondary | ICD-10-CM

## 2019-05-17 NOTE — Therapy (Signed)
[facility] [address], [place], [place], [address] Phone: [phone]   Fax:  [phone]  Physical Therapy Treatment  Patient Details  Name: Mary Graves MRN: [id number] Date of Birth: 1946/06/13 Referring Provider (PT): Dr. Cari Caraway    Encounter Date: 05/17/2019  PT End of Session - 05/17/19 1244    Visit Number  3    Date for PT Re-Evaluation  07/04/19    Authorization Type  Medicare    PT Start Time  0932    PT Stop Time  1015    PT Time Calculation (min)  43 min    Activity Tolerance  Patient tolerated treatment well       Past Medical History:  Diagnosis Date  . Alcoholism ([place])   . Hypertension   . Hypothyroidism   . Pancreatitis     Past Surgical History:  Procedure Laterality Date  . KNEE SURGERY Left    x 2  . NECK SURGERY     bilateral facet arthrosis  . TONSILLECTOMY      There were no vitals filed for this visit.  Subjective Assessment - 05/17/19 0934    Subjective  Had my colonscopy.  My neck is better.  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   I did walk 4 miles this morning which helps.  A little tightness but not really painful at the moment.  I haven't had a chance to read the info about DN.  I'm about 90% sure I want to do it but I want to read more about it first.    Pertinent History  2015 and 2016 series of injections without lasting results; osteopenia;  sensitive to heat/cold    Diagnostic tests  MRI C5-7 bulging disc hits spinal cord sometimes    Patient Stated Goals  I would like to have a routine way of loosening my neck up on a daily basis so it won't lock up; gain upper body strength maybe something at home    Currently in Pain?  No/denies    Pain Score  0-No pain    Pain Location  Neck    Pain Orientation  Right;Left    Pain Type  Chronic pain                       [facility] Adult PT  Treatment/Exercise - 05/17/19 0001      Neck Exercises: Seated   Other Seated Exercise  thoracic extension with ball 10x  With hands behind neck, elbows out for pectoral stretch   Other Seated Exercise  verbal review of HEP       Neck Exercises: Supine   Neck Retraction  10 reps;5 secs    Other Supine Exercise  yellow band scapular stabilization 5-10 x each:  narrow and wide hands, horizontal abduction double and single arm, sash, single and double arm external rotation     Other Supine Exercise  foam roll melt method: rotations, circles, nods 10x each      Manual Therapy   Soft tissue mobilization  gentle manual traction 3x 20 sec;  paraspinal and upper trap soft tissue;  contract/relax bil upper traps 3x 5 sec holds;  suboccipital release                PT Short Term Goals - 05/09/19 1656      PT SHORT TERM GOAL #1  Title  The patient will demonstrate knowledge of initial HEP and basic self care including postural correction    Time  4    Period  Weeks    Status  New    Target Date  06/06/19      PT SHORT TERM GOAL #2   Title  The patient will report a 30% reduction in neck pain with walking and driving    Time  4    Period  Weeks    Status  New      PT SHORT TERM GOAL #3   Title  The patient will have improved left sidebending ROM to 25 degrees and left rotation to 30 degrees needed for driving.    Time  4    Period  Weeks    Status  New      PT SHORT TERM GOAL #4   Title  The patient will have improved FOTO functional outcome score to 45% indicating improved function with less pain    Time  4    Period  Weeks    Status  New        PT Long Term Goals - 05/09/19 1702      PT LONG TERM GOAL #1   Title  The patient will be independent with a HEP for cervical ROM, shoulder and scapular muscle strengthening program    Time  8    Period  Weeks    Status  New    Target Date  07/04/19      PT LONG TERM GOAL #2   Title  The patient will report a 60%  improvement in neck pain with walking and driving    Time  8    Period  Weeks    Status  New      PT LONG TERM GOAL #3   Title  Cervical flexion and extension ROM improved to 40 degrees, bil rotation to 45 degrees needed for driving    Time  8    Period  Weeks    Status  New      PT LONG TERM GOAL #4   Title  The patient will have improved cervical and periscapular strength to 4/5 needed for lifting and carrying light to medium objects    Time  8    Period  Weeks    Status  New      PT LONG TERM GOAL #5   Title  FOTO functional outcome score improved to 40% indicating improved function with less pain    Time  8    Period  Weeks    Status  New            Plan - 05/17/19 1245    Clinical Impression Statement  The patient is receptive to education on postural correction and postural strengthening.  She likes the support of a ball in thoracic region when she sits to help with postural correction.  Improved soft tissue mobility following manual therapy but she does tender points in upper traps and suboccipitals which may respond well to dry needling.  The patient is still considering this.  Therapist closely monitoring response with all interventions.    Comorbidities  Grave's disease sensitive to heat/cold;  osteopenia;  HTN; depression per medical record; hearing and visual impairments    Rehab Potential  Good    PT Frequency  2x / week    PT Duration  8 weeks    PT Treatment/Interventions  ADLs/Self Care Home  Management;Electrical Stimulation;Cryotherapy;Ultrasound;Traction;Moist Heat;Neuromuscular re-education;Therapeutic exercise;Therapeutic activities;Patient/family education;Manual techniques;Taping;Dry needling    PT Next Visit Plan  DN to cervical multifidi, upper traps and suboccipitals if patient requests;   postural strengthening;  thoracic extension;  pectoral stretch, cervical retractions;  modalities as needed but monitor heat/cold secondary to sensitivity    PT Home  Exercise Plan  Access Code: [email or web address]       Patient will benefit from skilled therapeutic intervention in order to improve the following deficits and impairments:  Decreased range of motion, Increased muscle spasms, Pain, Decreased strength, Postural dysfunction  Visit Diagnosis: Cervicalgia  Cramp and spasm  Muscle weakness (generalized)     Problem List Patient Active Problem List   Diagnosis Date Noted  . Elevated troponin 07/11/2014  . Syncope 07/11/2014  . PTSD (post-traumatic stress disorder) 10/31/2013  . Alcoholism in family 10/31/2013  . Dementia ([place]) 10/09/2013  . HLD (hyperlipidemia) 02/27/2013  . Osteopenia 02/27/2013  . Alcohol dependence ([place]) 06/01/2012  . Major depression 06/01/2012  . Hypothyroidism 02/23/2009  . Essential hypertension 02/23/2009  . HYPERGLYCEMIA 02/23/2009   Ruben Im, PT 05/17/19 12:55 PM Phone: [phone] Fax: [phone] Alvera Singh 05/17/2019, 12:53 PM  [facility] [address] [place], [place], [address] Phone: [phone]   Fax:  [phone]  Name: Shekena Baudo MRN: [id number] Date of Birth: 27-Aug-1945

## 2019-05-22 ENCOUNTER — Encounter: Payer: Self-pay | Admitting: Physical Therapy

## 2019-05-22 ENCOUNTER — Other Ambulatory Visit: Payer: Self-pay

## 2019-05-22 ENCOUNTER — Ambulatory Visit: Payer: PPO | Admitting: Physical Therapy

## 2019-05-22 DIAGNOSIS — R252 Cramp and spasm: Secondary | ICD-10-CM

## 2019-05-22 DIAGNOSIS — M6281 Muscle weakness (generalized): Secondary | ICD-10-CM

## 2019-05-22 DIAGNOSIS — M542 Cervicalgia: Secondary | ICD-10-CM

## 2019-05-22 NOTE — Therapy (Signed)
[facility] [address] [place], [place], [address] Phone: [phone]   Fax:  [phone]  Physical Therapy Treatment  Patient Details  Name: Mary Graves MRN: [id number] Date of Birth: 02-14-1946 Referring Provider (PT): Dr. Cari Caraway    Encounter Date: 05/22/2019  PT End of Session - 05/22/19 1238    Visit Number  4    Date for PT Re-Evaluation  07/04/19    Authorization Type  Medicare    PT Start Time  1100    PT Stop Time  1145    PT Time Calculation (min)  45 min    Activity Tolerance  Patient tolerated treatment well;No increased pain    Behavior During Therapy  WFL for tasks assessed/performed       Past Medical History:  Diagnosis Date  . Alcoholism ([place])   . Hypertension   . Hypothyroidism   . Pancreatitis     Past Surgical History:  Procedure Laterality Date  . KNEE SURGERY Left    x 2  . NECK SURGERY     bilateral facet arthrosis  . TONSILLECTOMY      There were no vitals filed for this visit.  Subjective Assessment - 05/22/19 1106    Subjective  Pt states that she is having a little bit if pain in the neck when seated and it needs to be supported.    Pertinent History  2015 and 2016 series of injections without lasting results; osteopenia;  sensitive to heat/cold    Diagnostic tests  MRI C5-7 bulging disc hits spinal cord sometimes    Patient Stated Goals  I would like to have a routine way of loosening my neck up on a daily basis so it won't lock up; gain upper body strength maybe something at home    Currently in Pain?  No/denies   none lying down.                      [facility] Adult PT Treatment/Exercise - 05/22/19 0001      Neck Exercises: Machines for Strengthening   UBE (Upper Arm Bike)  L1 x11min forward/backward      Neck Exercises: Seated   Neck Retraction  5 reps;5 secs      Neck Exercises: Supine   Neck Retraction  10 reps;5 secs    Other Supine  Exercise  yellow TB: horizontal abduction x15 reps, diagonals each direction    Other Supine Exercise  snow angels x20 reps over soft foam roll       Neck Exercises: Sidelying   Other Sidelying Exercise  thoracic rotation x10 reps each direction      Manual Therapy   Manual Therapy  Joint mobilization    Manual therapy comments  passive Lt and Rt upper trap stretch 2x20 sec     Joint Mobilization  Lt and Rt cervical side glide grade III x2 bouts     Soft tissue mobilization  STM paraspinals and upper traps       Trigger Point Dry Needling - 05/22/19 0001    Consent Given?  Yes    Education Handout Provided  Previously provided    Muscles Treated Head and Neck  Upper trapezius    Upper Trapezius Response  Twitch reponse elicited;Palpable increased muscle length   Rt and Lt           PT Education - 05/22/19 1238    Education Details  technique with therex; dry  needling aftercare    Person(s) Educated  Patient    Methods  Explanation    Comprehension  Verbalized understanding       PT Short Term Goals - 05/09/19 1656      PT SHORT TERM GOAL #1   Title  The patient will demonstrate knowledge of initial HEP and basic self care including postural correction    Time  4    Period  Weeks    Status  New    Target Date  06/06/19      PT SHORT TERM GOAL #2   Title  The patient will report a 30% reduction in neck pain with walking and driving    Time  4    Period  Weeks    Status  New      PT SHORT TERM GOAL #3   Title  The patient will have improved left sidebending ROM to 25 degrees and left rotation to 30 degrees needed for driving.    Time  4    Period  Weeks    Status  New      PT SHORT TERM GOAL #4   Title  The patient will have improved FOTO functional outcome score to 45% indicating improved function with less pain    Time  4    Period  Weeks    Status  New        PT Long Term Goals - 05/09/19 1702      PT LONG TERM GOAL #1   Title  The patient will be  independent with a HEP for cervical ROM, shoulder and scapular muscle strengthening program    Time  8    Period  Weeks    Status  New    Target Date  07/04/19      PT LONG TERM GOAL #2   Title  The patient will report a 60% improvement in neck pain with walking and driving    Time  8    Period  Weeks    Status  New      PT LONG TERM GOAL #3   Title  Cervical flexion and extension ROM improved to 40 degrees, bil rotation to 45 degrees needed for driving    Time  8    Period  Weeks    Status  New      PT LONG TERM GOAL #4   Title  The patient will have improved cervical and periscapular strength to 4/5 needed for lifting and carrying light to medium objects    Time  8    Period  Weeks    Status  New      PT LONG TERM GOAL #5   Title  FOTO functional outcome score improved to 40% indicating improved function with less pain    Time  8    Period  Weeks    Status  New            Plan - 05/22/19 1239    Clinical Impression Statement  Pt continues to complete her HEP without reported difficulty. Today's session focused on therex to further promote scapular strength and cervical strength/posture. Pt did require PT cuing for proper technique with diagonals and shoulder external rotation. Pt was interested in dry needling, so PT completed this to the upper traps. PT also completed manual techniques of soft tissue and joint mobilization in the cervical spine. Pt felt that she was much more relaxed and without pain end of today's  session. Will continue with current POC.    Comorbidities  Grave's disease sensitive to heat/cold;  osteopenia;  HTN; depression per medical record; hearing and visual impairments    Rehab Potential  Good    PT Frequency  2x / week    PT Duration  8 weeks    PT Treatment/Interventions  ADLs/Self Care Home Management;Electrical Stimulation;Cryotherapy;Ultrasound;Traction;Moist Heat;Neuromuscular re-education;Therapeutic exercise;Therapeutic  activities;Patient/family education;Manual techniques;Taping;Dry needling    PT Next Visit Plan  DN as needed;  postural strengthening; thoracic extension;  pectoral stretch, cervical retractions;  modalities as needed but monitor heat/cold secondary to sensitivity    PT Home Exercise Plan  Access Code: [email or web address]       Patient will benefit from skilled therapeutic intervention in order to improve the following deficits and impairments:  Decreased range of motion, Increased muscle spasms, Pain, Decreased strength, Postural dysfunction  Visit Diagnosis: Cervicalgia  Cramp and spasm  Muscle weakness (generalized)     Problem List Patient Active Problem List   Diagnosis Date Noted  . Elevated troponin 07/11/2014  . Syncope 07/11/2014  . PTSD (post-traumatic stress disorder) 10/31/2013  . Alcoholism in family 10/31/2013  . Dementia ([place]) 10/09/2013  . HLD (hyperlipidemia) 02/27/2013  . Osteopenia 02/27/2013  . Alcohol dependence ([place]) 06/01/2012  . Major depression 06/01/2012  . Hypothyroidism 02/23/2009  . Essential hypertension 02/23/2009  . HYPERGLYCEMIA 02/23/2009    12:45 PM,05/22/19 Sherol Dade PT, DPT [place] [facility] [address], [place], [place], [address] Phone: [phone]   Fax:  [phone]  Name: Mary Graves MRN: [id number] Date of Birth: 1946/06/05

## 2019-05-23 DIAGNOSIS — Z23 Encounter for immunization: Secondary | ICD-10-CM | POA: Diagnosis not present

## 2019-05-23 DIAGNOSIS — L570 Actinic keratosis: Secondary | ICD-10-CM | POA: Diagnosis not present

## 2019-05-23 DIAGNOSIS — B079 Viral wart, unspecified: Secondary | ICD-10-CM | POA: Diagnosis not present

## 2019-05-23 DIAGNOSIS — D485 Neoplasm of uncertain behavior of skin: Secondary | ICD-10-CM | POA: Diagnosis not present

## 2019-05-24 ENCOUNTER — Other Ambulatory Visit: Payer: Self-pay

## 2019-05-24 ENCOUNTER — Ambulatory Visit: Payer: PPO | Admitting: Physical Therapy

## 2019-05-24 DIAGNOSIS — M542 Cervicalgia: Secondary | ICD-10-CM | POA: Diagnosis not present

## 2019-05-24 DIAGNOSIS — R252 Cramp and spasm: Secondary | ICD-10-CM

## 2019-05-24 DIAGNOSIS — M6281 Muscle weakness (generalized): Secondary | ICD-10-CM

## 2019-05-24 NOTE — Therapy (Signed)
[facility] [address], [place], [place], [address] Phone: [phone]   Fax:  [phone]  Physical Therapy Treatment  Patient Details  Name: Mary Graves MRN: [id number] Date of Birth: November 01, 1945 Referring Provider (PT): Dr. Cari Caraway    Encounter Date: 05/24/2019  PT End of Session - 05/24/19 1011    Visit Number  5    Date for PT Re-Evaluation  07/04/19    Authorization Type  Medicare    PT Start Time  0930    PT Stop Time  1013    PT Time Calculation (min)  43 min    Activity Tolerance  Patient tolerated treatment well;No increased pain       Past Medical History:  Diagnosis Date  . Alcoholism ([place])   . Hypertension   . Hypothyroidism   . Pancreatitis     Past Surgical History:  Procedure Laterality Date  . KNEE SURGERY Left    x 2  . NECK SURGERY     bilateral facet arthrosis  . TONSILLECTOMY      There were no vitals filed for this visit.  Subjective Assessment - 05/24/19 0931    Subjective  Neck is feeling great.  DN really helped last time.  I haven't been able turn my head this far in years.  I do some ex's while I walk.  My arms feel weak.    Pertinent History  2015 and 2016 series of injections without lasting results; osteopenia;  sensitive to heat/cold    How long can you walk comfortably?  walk 4 miles    Patient Stated Goals  I would like to have a routine way of loosening my neck up on a daily basis so it won't lock up; gain upper body strength maybe something at home    Currently in Pain?  No/denies    Pain Score  0-No pain    Pain Location  Neck         [facility] PT Assessment - 05/24/19 0001      AROM   Cervical Flexion  48    Cervical Extension  55    Cervical - Right Side Bend  37    Cervical - Left Side Bend  25    Cervical - Right Rotation  50    Cervical - Left Rotation  48                   [facility] Adult PT Treatment/Exercise - 05/24/19 0001      Therapeutic Activites    Therapeutic Activities  Lifting    Lifting  hip hinging using cane 10x;  10# partial deadlift 10x       Neck Exercises: Machines for Strengthening   UBE (Upper Arm Bike)  L1 5 min total  forward/backward      Neck Exercises: Standing   Wall Push Ups Limitations  yellow band bil UE lift offs 10x     Other Standing Exercises  snow angels 8x    Other Standing Exercises  yellow band       Neck Exercises: Seated   Other Seated Exercise  thoracic extension with ball 10x       Shoulder Exercises: Standing   Other Standing Exercises  1# 3 way UE lifts 2x 10 each    mirror feedback    Other Standing Exercises  standing with back to wall doing yellow band scapular series:  overheads, horizontal abduction, sash, external rotation 5x each  PT Short Term Goals - 05/24/19 1023      PT SHORT TERM GOAL #1   Title  The patient will demonstrate knowledge of initial HEP and basic self care including postural correction    Status  Achieved      PT SHORT TERM GOAL #2   Title  The patient will report a 30% reduction in neck pain with walking and driving    Time  4    Period  Weeks    Status  On-going      PT SHORT TERM GOAL #3   Title  The patient will have improved left sidebending ROM to 25 degrees and left rotation to 30 degrees needed for driving.    Status  Achieved      PT SHORT TERM GOAL #4   Title  The patient will have improved FOTO functional outcome score to 45% indicating improved function with less pain    Time  4    Period  Weeks    Status  On-going        PT Long Term Goals - 05/09/19 1702      PT LONG TERM GOAL #1   Title  The patient will be independent with a HEP for cervical ROM, shoulder and scapular muscle strengthening program    Time  8    Period  Weeks    Status  New    Target Date  07/04/19      PT LONG TERM GOAL #2   Title  The patient will report a 60% improvement in neck pain with walking and driving     Time  8    Period  Weeks    Status  New      PT LONG TERM GOAL #3   Title  Cervical flexion and extension ROM improved to 40 degrees, bil rotation to 45 degrees needed for driving    Time  8    Period  Weeks    Status  New      PT LONG TERM GOAL #4   Title  The patient will have improved cervical and periscapular strength to 4/5 needed for lifting and carrying light to medium objects    Time  8    Period  Weeks    Status  New      PT LONG TERM GOAL #5   Title  FOTO functional outcome score improved to 40% indicating improved function with less pain    Time  8    Period  Weeks    Status  New            Plan - 05/24/19 1011    Clinical Impression Statement  The patient has signficant improvements in cervical ROM in all planes.  She demonstrates good compliance with HEP and is receptive to a progression of exercise intensity to the standing position with and without wall support.  Verbal cues to avoid forward "hip hanging" posture which causes cervical protrusion/thoracic kyphosis.  She also needs cues for proper technique with bending/lifting hip hinge instead of rounding her spine with bending over.  No neck pain with any interventions but reports UE muscle fatigue with repetition.  Good progress toward STGs.    Comorbidities  Grave's disease sensitive to heat/cold;  osteopenia;  HTN; depression per medical record; hearing and visual impairments    Examination-Participation Restrictions  Meal Prep;Driving;Community Activity;Volunteer    Rehab Potential  Good    PT Frequency  2x / week  PT Duration  8 weeks    PT Treatment/Interventions  ADLs/Self Care Home Management;Electrical Stimulation;Cryotherapy;Ultrasound;Traction;Moist Heat;Neuromuscular re-education;Therapeutic exercise;Therapeutic activities;Patient/family education;Manual techniques;Taping;Dry needling    PT Next Visit Plan  DN as needed;  postural strengthening; thoracic extension;  pectoral stretch, cervical  retractions;  review hip hinge/ dead lifting;  modalities as needed but monitor heat/cold secondary to sensitivity    PT Home Exercise Plan  Access Code: [email or web address]       Patient will benefit from skilled therapeutic intervention in order to improve the following deficits and impairments:  Decreased range of motion, Increased muscle spasms, Pain, Decreased strength, Postural dysfunction  Visit Diagnosis: Cervicalgia  Cramp and spasm  Muscle weakness (generalized)     Problem List Patient Active Problem List   Diagnosis Date Noted  . Elevated troponin 07/11/2014  . Syncope 07/11/2014  . PTSD (post-traumatic stress disorder) 10/31/2013  . Alcoholism in family 10/31/2013  . Dementia ([place]) 10/09/2013  . HLD (hyperlipidemia) 02/27/2013  . Osteopenia 02/27/2013  . Alcohol dependence ([place]) 06/01/2012  . Major depression 06/01/2012  . Hypothyroidism 02/23/2009  . Essential hypertension 02/23/2009  . HYPERGLYCEMIA 02/23/2009   Ruben Im, PT 05/24/19 10:25 AM Phone: [phone] Fax: [phone] Alvera Singh 05/24/2019, 10:25 AM  [facility] [address], [place], [place], [address] Phone: [phone]   Fax:  [phone]  Name: Mary Graves MRN: [id number] Date of Birth: 06-11-1946

## 2019-05-28 ENCOUNTER — Other Ambulatory Visit: Payer: Self-pay

## 2019-05-28 ENCOUNTER — Ambulatory Visit: Payer: PPO | Admitting: Physical Therapy

## 2019-05-28 ENCOUNTER — Encounter: Payer: Self-pay | Admitting: Physical Therapy

## 2019-05-28 DIAGNOSIS — M6281 Muscle weakness (generalized): Secondary | ICD-10-CM

## 2019-05-28 DIAGNOSIS — M542 Cervicalgia: Secondary | ICD-10-CM

## 2019-05-28 DIAGNOSIS — R252 Cramp and spasm: Secondary | ICD-10-CM

## 2019-05-28 NOTE — Therapy (Signed)
[facility] [address], [place], [place], [address] Phone: [phone]   Fax:  [phone]  Physical Therapy Treatment  Patient Details  Name: Mary Graves MRN: [id number] Date of Birth: [birth year] Referring Provider (PT): Dr. Cari Caraway    Encounter Date: 05/28/2019  PT End of Session - 05/28/19 1441    Visit Number  6    Date for PT Re-Evaluation  07/04/19    Authorization Type  Medicare    PT Start Time  1400    PT Stop Time  1441    PT Time Calculation (min)  41 min    Activity Tolerance  Patient tolerated treatment well;No increased pain    Behavior During Therapy  WFL for tasks assessed/performed       Past Medical History:  Diagnosis Date  . Alcoholism ([place])   . Hypertension   . Hypothyroidism   . Pancreatitis     Past Surgical History:  Procedure Laterality Date  . KNEE SURGERY Left    x 2  . NECK SURGERY     bilateral facet arthrosis  . TONSILLECTOMY      There were no vitals filed for this visit.  Subjective Assessment - 05/28/19 1402    Subjective  Patient repots no pain, just weakness.    Pertinent History  2015 and 2016 series of injections without lasting results; osteopenia;  sensitive to heat/cold    Patient Stated Goals  I would like to have a routine way of loosening my neck up on a daily basis so it won't lock up; gain upper body strength maybe something at home                       [facility] Adult PT Treatment/Exercise - 05/28/19 0001      Neck Exercises: Machines for Strengthening   UBE (Upper Arm Bike)  L1 x 2 min total  forward/backward 1 min     Cybex Row  20# x 15    Lat Pull  15# x 20    Other Machines for Strengthening  bil shldr ext 15# x 15      Neck Exercises: Standing   Other Standing Exercises  snow angels 10x   foam roller along spine   Other Standing Exercises  scap retraction on noodle x 10       Neck Exercises: Seated   Other Seated  Exercise  thoracic extension with ball 10x     Other Seated Exercise  chops with ball x 15 with head following ball      Shoulder Exercises: Standing   Other Standing Exercises  1# 3 way UE lifts 2x 10 each        Other Standing Exercises  standing with back to wall doing yellow band scapular series:  overheads, horizontal abduction, sash, external rotation 5x each       Neck Exercises: Stretches   Other Neck Stretches  supine C stretch for QL 2 x 60 sec               PT Short Term Goals - 05/24/19 1023      PT SHORT TERM GOAL #1   Title  The patient will demonstrate knowledge of initial HEP and basic self care including postural correction    Status  Achieved      PT SHORT TERM GOAL #2   Title  The patient will report a 30% reduction in neck pain with walking  and driving    Time  4    Period  Weeks    Status  On-going      PT SHORT TERM GOAL #3   Title  The patient will have improved left sidebending ROM to 25 degrees and left rotation to 30 degrees needed for driving.    Status  Achieved      PT SHORT TERM GOAL #4   Title  The patient will have improved FOTO functional outcome score to 45% indicating improved function with less pain    Time  4    Period  Weeks    Status  On-going        PT Long Term Goals - 05/09/19 1702      PT LONG TERM GOAL #1   Title  The patient will be independent with a HEP for cervical ROM, shoulder and scapular muscle strengthening program    Time  8    Period  Weeks    Status  New    Target Date  07/04/19      PT LONG TERM GOAL #2   Title  The patient will report a 60% improvement in neck pain with walking and driving    Time  8    Period  Weeks    Status  New      PT LONG TERM GOAL #3   Title  Cervical flexion and extension ROM improved to 40 degrees, bil rotation to 45 degrees needed for driving    Time  8    Period  Weeks    Status  New      PT LONG TERM GOAL #4   Title  The patient will have improved cervical and  periscapular strength to 4/5 needed for lifting and carrying light to medium objects    Time  8    Period  Weeks    Status  New      PT LONG TERM GOAL #5   Title  FOTO functional outcome score improved to 40% indicating improved function with less pain    Time  8    Period  Weeks    Status  New            Plan - 05/28/19 1442    Clinical Impression Statement  Patient did very well with TE. She has a tight right QL which is depressing scapula on right. Supine QL stretch was issued. Pt still needs supervision and cueing with dead lifts.    PT Treatment/Interventions  ADLs/Self Care Home Management;Electrical Stimulation;Cryotherapy;Ultrasound;Traction;Moist Heat;Neuromuscular re-education;Therapeutic exercise;Therapeutic activities;Patient/family education;Manual techniques;Taping;Dry needling    PT Next Visit Plan  DN as needed;  postural strengthening; thoracic extension;  pectoral stretch, cervical retractions;  review hip hinge/ dead lifting;  modalities as needed but monitor heat/cold secondary to sensitivity    PT Home Exercise Plan  Access Code: [email or web address]       Patient will benefit from skilled therapeutic intervention in order to improve the following deficits and impairments:  Decreased range of motion, Increased muscle spasms, Pain, Decreased strength, Postural dysfunction  Visit Diagnosis: Cervicalgia  Cramp and spasm  Muscle weakness (generalized)     Problem List Patient Active Problem List   Diagnosis Date Noted  . Elevated troponin 07/11/2014  . Syncope 07/11/2014  . PTSD (post-traumatic stress disorder) 10/31/2013  . Alcoholism in family 10/31/2013  . Dementia ([place]) 10/09/2013  . HLD (hyperlipidemia) 02/27/2013  . Osteopenia 02/27/2013  . Alcohol dependence ([place]) 06/01/2012  . Major depression  06/01/2012  . Hypothyroidism 02/23/2009  . Essential hypertension 02/23/2009  . HYPERGLYCEMIA 02/23/2009    Madelyn Flavors PT 05/28/2019, 3:32 PM  [facility] [address], [place], [place], [address] Phone: [phone]   Fax:  [phone]  Name: Tiah Asuncion MRN: [id number] Date of Birth: [birth year]

## 2019-05-31 ENCOUNTER — Ambulatory Visit: Payer: PPO | Admitting: Physical Therapy

## 2019-06-04 ENCOUNTER — Encounter: Payer: Self-pay | Admitting: Physical Therapy

## 2019-06-04 ENCOUNTER — Other Ambulatory Visit: Payer: Self-pay

## 2019-06-04 ENCOUNTER — Ambulatory Visit: Payer: PPO | Attending: Family Medicine | Admitting: Physical Therapy

## 2019-06-04 DIAGNOSIS — R252 Cramp and spasm: Secondary | ICD-10-CM | POA: Insufficient documentation

## 2019-06-04 DIAGNOSIS — M542 Cervicalgia: Secondary | ICD-10-CM | POA: Insufficient documentation

## 2019-06-04 DIAGNOSIS — M6281 Muscle weakness (generalized): Secondary | ICD-10-CM

## 2019-06-04 NOTE — Therapy (Signed)
[facility] [address] [place], [place], [address] Phone: [phone]   Fax:  [phone]  Physical Therapy Treatment/Discharge Summary   Patient Details  Name: Mary Graves MRN: [id number] Date of Birth: Dec 02, 1945 Referring Provider (PT): Dr. Cari Caraway    Encounter Date: 06/04/2019  PT End of Session - 06/04/19 1446    Visit Number  7    Date for PT Re-Evaluation  07/04/19    Authorization Type  Medicare    PT Start Time  1400    PT Stop Time  1430   discharge visit   PT Time Calculation (min)  30 min    Activity Tolerance  Patient tolerated treatment well;No increased pain       Past Medical History:  Diagnosis Date  . Alcoholism ([place])   . Hypertension   . Hypothyroidism   . Pancreatitis     Past Surgical History:  Procedure Laterality Date  . KNEE SURGERY Left    x 2  . NECK SURGERY     bilateral facet arthrosis  . TONSILLECTOMY      There were no vitals filed for this visit.  Subjective Assessment - 06/04/19 1358    Subjective  Neck is doing OK.  That DN really helped.    Pertinent History  2015 and 2016 series of injections without lasting results; osteopenia;  sensitive to heat/cold    Diagnostic tests  MRI C5-7 bulging disc hits spinal cord sometimes    Patient Stated Goals  I would like to have a routine way of loosening my neck up on a daily basis so it won't lock up; gain upper body strength maybe something at home    Currently in Pain?  No/denies    Pain Score  0-No pain         [facility] PT Assessment - 06/04/19 0001      Observation/Other Assessments   Focus on Therapeutic Outcomes (FOTO)   35% limitation       AROM   Cervical Flexion  72    Cervical Extension  55    Cervical - Right Side Bend  35    Cervical - Left Side Bend  45    Cervical - Right Rotation  55    Cervical - Left Rotation  50      Strength   Overall Strength Comments  periscapular muscles 4+/5      Cervical Flexion  4+/5    Cervical Extension  4+/5    Cervical - Right Side Bend  4+/5    Cervical - Left Side Bend  4+/5    Cervical - Right Rotation  4+/5    Cervical - Left Rotation  4+/5                   [facility] Adult PT Treatment/Exercise - 06/04/19 0001      Neck Exercises: Machines for Strengthening   UBE (Upper Arm Bike)  L1 x 2 min total  forward/backward 1 min     Cybex Row  15# x 15    Other Machines for Strengthening  bil shldr ext 15# x 15      Neck Exercises: Seated   Other Seated Exercise  review of HEP and appropriate gym ex machines with emphasis on extension exs                PT Short Term Goals - 06/04/19 1535      PT SHORT TERM GOAL #  1   Title  The patient will demonstrate knowledge of initial HEP and basic self care including postural correction    Status  Achieved      PT SHORT TERM GOAL #2   Title  The patient will report a 30% reduction in neck pain with walking and driving    Status  Achieved      PT SHORT TERM GOAL #3   Title  The patient will have improved left sidebending ROM to 25 degrees and left rotation to 30 degrees needed for driving.    Status  Achieved      PT SHORT TERM GOAL #4   Title  The patient will have improved FOTO functional outcome score to 45% indicating improved function with less pain    Status  Achieved        PT Long Term Goals - 06/04/19 1536      PT LONG TERM GOAL #1   Title  The patient will be independent with a HEP for cervical ROM, shoulder and scapular muscle strengthening program    Status  Achieved      PT LONG TERM GOAL #2   Title  The patient will report a 60% improvement in neck pain with walking and driving    Status  Achieved      PT LONG TERM GOAL #3   Title  Cervical flexion and extension ROM improved to 40 degrees, bil rotation to 45 degrees needed for driving    Status  Achieved      PT LONG TERM GOAL #4   Title  The patient will have improved cervical and periscapular  strength to 4/5 needed for lifting and carrying light to medium objects    Status  Achieved      PT LONG TERM GOAL #5   Title  FOTO functional outcome score improved to 40% indicating improved function with less pain    Status  Achieved            Plan - 06/04/19 1447    Clinical Impression Statement  The patient reports she is overall 85-90% better since starting PT.  Her cervical ROM has improved significantly in all planes and improved rotation makes driving easier.  Her cervical and UE has also improved to grossly 4+/5.  Her FOTO functional outcome score has improved from 50% limitation to 35%.  She expresses a good understanding of a safe self progression of HEP and readiness for discharge from PT.  All rehab goals have been met.    Comorbidities  Grave's disease sensitive to heat/cold;  osteopenia;  HTN; depression per medical record; hearing and visual impairments       Patient will benefit from skilled therapeutic intervention in order to improve the following deficits and impairments:     Visit Diagnosis: Cervicalgia  Cramp and spasm  Muscle weakness (generalized)  PHYSICAL THERAPY DISCHARGE SUMMARY  Visits from Start of Care: 7 Current functional level related to goals / functional outcomes: See clinical impressions above   Remaining deficits: As above   Education / Equipment: HEP  Plan: Patient agrees to discharge.  Patient goals were met. Patient is being discharged due to meeting the stated rehab goals.  ?????          Problem List Patient Active Problem List   Diagnosis Date Noted  . Elevated troponin 07/11/2014  . Syncope 07/11/2014  . PTSD (post-traumatic stress disorder) 10/31/2013  . Alcoholism in family 10/31/2013  . Dementia ([place]) 10/09/2013  .  HLD (hyperlipidemia) 02/27/2013  . Osteopenia 02/27/2013  . Alcohol dependence ([place]) 06/01/2012  . Major depression 06/01/2012  . Hypothyroidism 02/23/2009  . Essential hypertension 02/23/2009   . HYPERGLYCEMIA 02/23/2009   Ruben Im, PT 06/04/19 4:43 PM Phone: [phone] Fax: [phone] Alvera Singh 06/04/2019, 4:42 PM  [facility] [address] [place], [place], [address] Phone: [phone]   Fax:  [phone]  Name: Korissa Horsford MRN: [id number] Date of Birth: Mar 06, 1946

## 2019-06-07 ENCOUNTER — Encounter: Payer: PPO | Admitting: Physical Therapy

## 2019-06-11 ENCOUNTER — Encounter: Payer: PPO | Admitting: Physical Therapy

## 2019-06-18 ENCOUNTER — Encounter: Payer: PPO | Admitting: Physical Therapy

## 2019-06-19 DIAGNOSIS — E039 Hypothyroidism, unspecified: Secondary | ICD-10-CM | POA: Diagnosis not present

## 2019-06-19 DIAGNOSIS — H903 Sensorineural hearing loss, bilateral: Secondary | ICD-10-CM | POA: Diagnosis not present

## 2019-08-07 DIAGNOSIS — H2513 Age-related nuclear cataract, bilateral: Secondary | ICD-10-CM | POA: Diagnosis not present

## 2019-08-13 DIAGNOSIS — L570 Actinic keratosis: Secondary | ICD-10-CM | POA: Diagnosis not present

## 2019-08-13 DIAGNOSIS — Z808 Family history of malignant neoplasm of other organs or systems: Secondary | ICD-10-CM | POA: Diagnosis not present

## 2019-08-13 DIAGNOSIS — L814 Other melanin hyperpigmentation: Secondary | ICD-10-CM | POA: Diagnosis not present

## 2019-08-13 DIAGNOSIS — L821 Other seborrheic keratosis: Secondary | ICD-10-CM | POA: Diagnosis not present

## 2019-08-13 DIAGNOSIS — D225 Melanocytic nevi of trunk: Secondary | ICD-10-CM | POA: Diagnosis not present

## 2019-08-13 DIAGNOSIS — L578 Other skin changes due to chronic exposure to nonionizing radiation: Secondary | ICD-10-CM | POA: Diagnosis not present

## 2019-08-13 DIAGNOSIS — L309 Dermatitis, unspecified: Secondary | ICD-10-CM | POA: Diagnosis not present

## 2019-08-19 ENCOUNTER — Encounter: Payer: Self-pay | Admitting: Physical Therapy

## 2019-08-19 ENCOUNTER — Other Ambulatory Visit: Payer: PPO

## 2019-08-30 DIAGNOSIS — M810 Age-related osteoporosis without current pathological fracture: Secondary | ICD-10-CM | POA: Diagnosis not present

## 2019-08-30 DIAGNOSIS — I10 Essential (primary) hypertension: Secondary | ICD-10-CM | POA: Diagnosis not present

## 2019-08-30 DIAGNOSIS — N1832 Chronic kidney disease, stage 3b: Secondary | ICD-10-CM | POA: Diagnosis not present

## 2019-08-30 DIAGNOSIS — E039 Hypothyroidism, unspecified: Secondary | ICD-10-CM | POA: Diagnosis not present

## 2019-08-30 DIAGNOSIS — E0500 Thyrotoxicosis with diffuse goiter without thyrotoxic crisis or storm: Secondary | ICD-10-CM | POA: Diagnosis not present

## 2019-09-01 ENCOUNTER — Ambulatory Visit: Payer: PPO

## 2019-09-02 ENCOUNTER — Ambulatory Visit: Payer: PPO | Attending: Internal Medicine

## 2019-09-02 ENCOUNTER — Encounter: Payer: Self-pay | Admitting: Physical Therapy

## 2019-09-02 DIAGNOSIS — Z23 Encounter for immunization: Secondary | ICD-10-CM | POA: Insufficient documentation

## 2019-09-02 NOTE — Progress Notes (Signed)
   Covid-19 Vaccination Clinic  Name:  Mary Graves    MRN: [id number] DOB: [birth year]  09/02/2019  Mary Graves was observed post Covid-19 immunization for 15 minutes without incidence. She was provided with Vaccine Information Sheet and instruction to access the V-Safe system.   Mary Graves was instructed to call 911 with any severe reactions post vaccine: Marland Kitchen Difficulty breathing  . Swelling of your face and throat  . A fast heartbeat  . A bad rash all over your body  . Dizziness and weakness    Immunizations Administered    Name Date Dose VIS Date Route   Pfizer COVID-19 Vaccine 09/02/2019 11:39 AM 0.3 mL 07/12/2019 Intramuscular   Manufacturer: [place]   Lot: [id number]   [place]: [id number]

## 2019-09-07 ENCOUNTER — Ambulatory Visit: Payer: PPO

## 2019-09-10 DIAGNOSIS — H25811 Combined forms of age-related cataract, right eye: Secondary | ICD-10-CM | POA: Diagnosis not present

## 2019-09-10 DIAGNOSIS — H25011 Cortical age-related cataract, right eye: Secondary | ICD-10-CM | POA: Diagnosis not present

## 2019-09-10 DIAGNOSIS — H2511 Age-related nuclear cataract, right eye: Secondary | ICD-10-CM | POA: Diagnosis not present

## 2019-09-10 DIAGNOSIS — H52201 Unspecified astigmatism, right eye: Secondary | ICD-10-CM | POA: Diagnosis not present

## 2019-09-12 ENCOUNTER — Ambulatory Visit: Payer: PPO

## 2019-09-26 ENCOUNTER — Ambulatory Visit: Payer: PPO | Attending: Internal Medicine

## 2019-09-26 DIAGNOSIS — Z23 Encounter for immunization: Secondary | ICD-10-CM | POA: Insufficient documentation

## 2019-09-26 NOTE — Progress Notes (Signed)
   Covid-19 Vaccination Clinic  Name:  Mary Graves    MRN: [id number] DOB: [birth year]  09/26/2019  Mary Graves was observed post Covid-19 immunization for 15 minutes without incidence. She was provided with Vaccine Information Sheet and instruction to access the V-Safe system.   Mary Graves was instructed to call 911 with any severe reactions post vaccine: Marland Kitchen Difficulty breathing  . Swelling of your face and throat  . A fast heartbeat  . A bad rash all over your body  . Dizziness and weakness    Immunizations Administered    Name Date Dose VIS Date Route   Pfizer COVID-19 Vaccine 09/26/2019 10:30 AM 0.3 mL 07/12/2019 Intramuscular   Manufacturer: [place]   Lot: [id number]   [place]: [id number]

## 2019-09-27 DIAGNOSIS — M810 Age-related osteoporosis without current pathological fracture: Secondary | ICD-10-CM | POA: Diagnosis not present

## 2019-10-08 DIAGNOSIS — H25812 Combined forms of age-related cataract, left eye: Secondary | ICD-10-CM | POA: Diagnosis not present

## 2019-10-08 DIAGNOSIS — H52202 Unspecified astigmatism, left eye: Secondary | ICD-10-CM | POA: Diagnosis not present

## 2019-10-08 DIAGNOSIS — H25012 Cortical age-related cataract, left eye: Secondary | ICD-10-CM | POA: Diagnosis not present

## 2019-10-08 DIAGNOSIS — H2512 Age-related nuclear cataract, left eye: Secondary | ICD-10-CM | POA: Diagnosis not present

## 2019-10-10 ENCOUNTER — Ambulatory Visit: Payer: PPO | Admitting: Physical Therapy

## 2019-10-24 ENCOUNTER — Ambulatory Visit: Payer: PPO | Attending: Family Medicine | Admitting: Physical Therapy

## 2019-10-24 ENCOUNTER — Encounter: Payer: Self-pay | Admitting: Physical Therapy

## 2019-10-24 ENCOUNTER — Other Ambulatory Visit: Payer: Self-pay

## 2019-10-24 DIAGNOSIS — M6281 Muscle weakness (generalized): Secondary | ICD-10-CM | POA: Insufficient documentation

## 2019-10-24 DIAGNOSIS — M542 Cervicalgia: Secondary | ICD-10-CM | POA: Diagnosis not present

## 2019-10-24 DIAGNOSIS — R252 Cramp and spasm: Secondary | ICD-10-CM | POA: Diagnosis not present

## 2019-10-24 NOTE — Therapy (Signed)
[facility] [address], [place], [place], [address] Phone: [phone]   Fax:  [phone]  Physical Therapy Evaluation  Patient Details  Name: Mary Graves MRN: [id number] Date of Birth: Mar 25, 1946 Referring Provider (PT): Cari Caraway, MD   Encounter Date: 10/24/2019  PT End of Session - 10/24/19 1205    Visit Number  1    Date for PT Re-Evaluation  12/19/19    Authorization Type  medicaid - [employer]    PT Start Time  1017    PT Stop Time  1054    PT Time Calculation (min)  37 min    Activity Tolerance  Patient tolerated treatment well    Behavior During Therapy  [facility] for tasks assessed/performed       Past Medical History:  Diagnosis Date  . Alcoholism ([place])   . Hypertension   . Hypothyroidism   . Pancreatitis     Past Surgical History:  Procedure Laterality Date  . KNEE SURGERY Left    x 2  . NECK SURGERY     bilateral facet arthrosis  . TONSILLECTOMY      There were no vitals filed for this visit.   Subjective Assessment - 10/24/19 1026    Subjective  Pt states she got to the point where she could barely move her neck.  Had been here previously and had dry needling it helped a lot.  Recent onset of neck pain seemed to happen after second covid shot.  Pt is feeling better now but wants to get back to where it was before    Patient Stated Goals  get back to weight lifting/bands and get back to being pain free    Currently in Pain?  Yes    Pain Score  3     Pain Location  Neck    Pain Orientation  Right;Left   Lt>Rt today   Pain Descriptors / Indicators  Tightness    Pain Type  Chronic pain    Pain Onset  More than a month ago    Pain Frequency  Intermittent    Pain Relieving Factors  exercises make a difference    Multiple Pain Sites  No         [facility] PT Assessment - 10/24/19 0001      Assessment   Medical Diagnosis  M54.2 (ICD-10-CM) - Cervicalgia    Referring Provider  (PT)  Cari Caraway, MD    Prior Therapy  Yes last year for same issue      Precautions   Precautions  None      Restrictions   Weight Bearing Restrictions  No      Balance Screen   Has the patient fallen in the past 6 months  No      [place] residence    Living Arrangements  Alone      Prior Function   Level of Independence  Independent      Cognition   Overall Cognitive Status  Within Functional Limits for tasks assessed      Observation/Other Assessments   Focus on Therapeutic Outcomes (FOTO)   46% limited; goal is 37% limited      Posture/Postural Control   Posture/Postural Control  Postural limitations    Postural Limitations  Rounded Shoulders;Forward head;Increased thoracic kyphosis      ROM / Strength   AROM / PROM / Strength  AROM;Strength      AROM  AROM Assessment Site  Cervical    Cervical - Right Side Bend  40    Cervical - Left Side Bend  32    Cervical - Right Rotation  55    Cervical - Left Rotation  35      Strength   Overall Strength Comments  shoulder abduction 4/5 MMT bilat    Strength Assessment Site  Cervical    Cervical Flexion  5/5    Cervical Extension  5/5    Cervical - Right Side Bend  5/5    Cervical - Left Side Bend  5/5    Cervical - Right Rotation  4-/5    Cervical - Left Rotation  4/5      Palpation   Palpation comment  upper traps Lt>Rt, upper thoracic paraspinals tight and TTP      Special Tests    Special Tests  Cervical    Cervical Tests  Dictraction      Distraction Test   Findngs  Positive    Comment  reduced pain                Objective measurements completed on examination: See above findings.      [facility] Adult PT Treatment/Exercise - 10/24/19 0001      Self-Care   Self-Care  Other Self-Care Comments    Other Self-Care Comments   initial HEP       Trigger Point Dry Needling - 10/24/19 0001    Consent Given?  Yes    Education Handout Provided  Previously  provided    Muscles Treated Head and Neck  Upper trapezius    Upper Trapezius Response  Palpable increased muscle length           PT Education - 10/24/19 1051    Education Details  Access Code: [email or web address] and tennis balls behind head for stretch to suboccipitals    Person(s) Educated  Patient    Methods  Explanation;Demonstration;Verbal cues;Handout    Comprehension  Verbalized understanding;Returned demonstration          PT Long Term Goals - 10/24/19 1202      PT LONG TERM GOAL #1   Title  The patient will be independent with a HEP for cervical ROM, shoulder and scapular muscle strengthening program    Time  8    Period  Weeks    Status  New    Target Date  12/19/19      PT LONG TERM GOAL #2   Title  The patient will report a 60% improvement in neck pain with walking and driving    Time  8    Period  Weeks    Status  New    Target Date  12/19/19      PT LONG TERM GOAL #3   Title  Cervical flexion and extension ROM improved to 40 degrees, bil rotation to 50 degrees needed for driving    Time  8    Period  Weeks    Status  New    Target Date  12/19/19      PT LONG TERM GOAL #4   Title  The patient will have improved cervical rotation and periscapular strength to 4+/5 needed for lifting and carrying light to medium objects    Time  8    Period  Weeks    Status  New    Target Date  12/19/19      PT LONG TERM GOAL #5   Title  FOTO functional outcome score improved to 37% indicating improved function with less pain    Time  8    Period  Weeks    Status  New    Target Date  12/19/19             Plan - 10/24/19 1157    Clinical Impression Statement  Pt presents to skilled PT due to aggravation of chronic neck pain. Pt had dry needling in the past and it helped a lot.  Pt has posture deficits and weakness as mentioned above.  Pt has not been exercises and will benefit from guided exercise program to get her safely back into a regular strengthening routine.   She also has reduced ROM.  pt will benefit from skilled PT to address these deficits for maximum function and reutrn to healthy lifestyle.    Personal Factors and Comorbidities  Time since onset of injury/illness/exacerbation;Past/Current Experience    Stability/Clinical Decision Making  Stable/Uncomplicated    Clinical Decision Making  Low    Rehab Potential  Excellent    PT Frequency  1x / week    PT Duration  8 weeks    PT Treatment/Interventions  ADLs/Self Care Home Management;Biofeedback;Cryotherapy;Electrical Stimulation;Moist Heat;Traction;Neuromuscular re-education;Therapeutic exercise;Therapeutic activities;Patient/family education;Taping;Passive range of motion;Dry needling;Manual techniques    PT Next Visit Plan  f/u on DN; review HEP, thoracic extension and rotation, UE strength and posture    PT Home Exercise Plan  Access Code: [email or web address]    Consulted and Agree with Plan of Care  Patient       Patient will benefit from skilled therapeutic intervention in order to improve the following deficits and impairments:  Decreased coordination, Decreased range of motion, Pain, Increased muscle spasms, Postural dysfunction, Decreased strength  Visit Diagnosis: Cervicalgia  Cramp and spasm  Muscle weakness (generalized)     Problem List Patient Active Problem List   Diagnosis Date Noted  . Elevated troponin 07/11/2014  . Syncope 07/11/2014  . PTSD (post-traumatic stress disorder) 10/31/2013  . Alcoholism in family 10/31/2013  . Dementia ([place]) 10/09/2013  . HLD (hyperlipidemia) 02/27/2013  . Osteopenia 02/27/2013  . Alcohol dependence ([place]) 06/01/2012  . Major depression 06/01/2012  . Hypothyroidism 02/23/2009  . Essential hypertension 02/23/2009  . HYPERGLYCEMIA 02/23/2009    Jule Ser, PT 10/24/2019, 12:08 PM  [facility] [address], [place], [place], [address] Phone: [phone]   Fax:   [phone]  Name: Mary Graves MRN: [id number] Date of Birth: [birth year]

## 2019-10-24 NOTE — Patient Instructions (Signed)
Access Code: [email or web address] URL: [email or web address] Date: 10/24/2019 Prepared by: Jari Favre  Exercises Seated Cervical Retraction - 2 x daily - 7 x weekly - 10 reps - 1 sets Supine Chin Tuck - 1 x daily - 7 x weekly - 10 reps - 1 sets Seated Cervical Sidebending Stretch - 1 x daily - 7 x weekly - 3 reps - 1 sets - 30 hold Neck Rotation - 1 x daily - 7 x weekly - 3 reps - 1 sets - 30 hold Thoracic Extension Mobilization with Noodle - 1 x daily - 7 x weekly - 10 reps - 3 sets

## 2019-10-29 ENCOUNTER — Other Ambulatory Visit: Payer: Self-pay

## 2019-10-29 ENCOUNTER — Encounter: Payer: Self-pay | Admitting: Physical Therapy

## 2019-10-29 ENCOUNTER — Ambulatory Visit: Payer: PPO | Admitting: Physical Therapy

## 2019-10-29 DIAGNOSIS — M542 Cervicalgia: Secondary | ICD-10-CM

## 2019-10-29 DIAGNOSIS — R252 Cramp and spasm: Secondary | ICD-10-CM

## 2019-10-29 DIAGNOSIS — M6281 Muscle weakness (generalized): Secondary | ICD-10-CM

## 2019-10-29 NOTE — Therapy (Signed)
[facility] [address], [place], [place], [address] Phone: [phone]   Fax:  [phone]  Physical Therapy Treatment  Patient Details  Name: Mary Graves MRN: [id number] Date of Birth: June 15, 1946 Referring Provider (PT): Cari Caraway, MD   Encounter Date: 10/29/2019  PT End of Session - 10/29/19 1244    Visit Number  2    Date for PT Re-Evaluation  12/19/19    Authorization Type  medicaid - [employer]    PT Start Time  [id number]    PT Stop Time  1315    PT Time Calculation (min)  40 min    Activity Tolerance  Patient tolerated treatment well    Behavior During Therapy  [facility] for tasks assessed/performed       Past Medical History:  Diagnosis Date  . Alcoholism ([place])   . Hypertension   . Hypothyroidism   . Pancreatitis     Past Surgical History:  Procedure Laterality Date  . KNEE SURGERY Left    x 2  . NECK SURGERY     bilateral facet arthrosis  . TONSILLECTOMY      There were no vitals filed for this visit.  Subjective Assessment - 10/29/19 1238    Subjective  Pt states she has a little more flexibility.    Patient Stated Goals  get back to weight lifting/bands and get back to being pain free    Currently in Pain?  No/denies                       [facility] Adult PT Treatment/Exercise - 10/29/19 0001      Exercises   Exercises  Shoulder      Shoulder Exercises: Supine   Horizontal ABduction  Strengthening;Both;Theraband;15 reps    Theraband Level (Shoulder Horizontal ABduction)  Level 1 (Yellow)    External Rotation  Strengthening;Both;Theraband;15 reps    Theraband Level (Shoulder External Rotation)  Level 1 (Yellow)    Flexion  Strengthening;Both;Theraband;15 reps    Flexion Limitations  yellow band    Diagonals  Strengthening;Both;20 reps;Theraband    Theraband Level (Shoulder Diagonals)  Level 1 (Yellow)      Manual Therapy   Manual Therapy  Soft tissue mobilization     Soft tissue mobilization  manual and skilled Palpation to cervical musculature       Trigger Point Dry Needling - 10/29/19 0001    Consent Given?  Yes    Education Handout Provided  Previously provided    Muscles Treated Head and Neck  Upper trapezius;Cervical multifidi;Suboccipitals    Upper Trapezius Response  Twitch reponse elicited;Palpable increased muscle length    Suboccipitals Response  Palpable increased muscle length    Cervical multifidi Response  Palpable increased muscle length           PT Education - 10/29/19 1314    Education Details  Access Code: [email or web address]    Person(s) Educated  Patient    Methods  Explanation;Demonstration;Verbal cues;Handout    Comprehension  Verbalized understanding;Returned demonstration          PT Long Term Goals - 10/24/19 1202      PT LONG TERM GOAL #1   Title  The patient will be independent with a HEP for cervical ROM, shoulder and scapular muscle strengthening program    Time  8    Period  Weeks    Status  New    Target Date  12/19/19  PT LONG TERM GOAL #2   Title  The patient will report a 60% improvement in neck pain with walking and driving    Time  8    Period  Weeks    Status  New    Target Date  12/19/19      PT LONG TERM GOAL #3   Title  Cervical flexion and extension ROM improved to 40 degrees, bil rotation to 50 degrees needed for driving    Time  8    Period  Weeks    Status  New    Target Date  12/19/19      PT LONG TERM GOAL #4   Title  The patient will have improved cervical rotation and periscapular strength to 4+/5 needed for lifting and carrying light to medium objects    Time  8    Period  Weeks    Status  New    Target Date  12/19/19      PT LONG TERM GOAL #5   Title  FOTO functional outcome score improved to 37% indicating improved function with less pain    Time  8    Period  Weeks    Status  New    Target Date  12/19/19            Plan - 10/29/19 1315    Clinical  Impression Statement  Pt was able to progress with band exercises today.  Overall she is feeling better since initial treatment.  Pt got improved soft tissue length upon palpation after dry needling and STM. Pt will benefit from skilled PT to work on postural strength during functional activities.    PT Treatment/Interventions  ADLs/Self Care Home Management;Biofeedback;Cryotherapy;Electrical Stimulation;Moist Heat;Traction;Neuromuscular re-education;Therapeutic exercise;Therapeutic activities;Patient/family education;Taping;Passive range of motion;Dry needling;Manual techniques    PT Next Visit Plan  f/u on DN and HEP; progress strength with lifting techniques    PT Home Exercise Plan  Access Code: [email or web address]    Consulted and Agree with Plan of Care  Patient       Patient will benefit from skilled therapeutic intervention in order to improve the following deficits and impairments:  Decreased coordination, Decreased range of motion, Pain, Increased muscle spasms, Postural dysfunction, Decreased strength  Visit Diagnosis: Cervicalgia  Cramp and spasm  Muscle weakness (generalized)     Problem List Patient Active Problem List   Diagnosis Date Noted  . Elevated troponin 07/11/2014  . Syncope 07/11/2014  . PTSD (post-traumatic stress disorder) 10/31/2013  . Alcoholism in family 10/31/2013  . Dementia ([place]) 10/09/2013  . HLD (hyperlipidemia) 02/27/2013  . Osteopenia 02/27/2013  . Alcohol dependence ([place]) 06/01/2012  . Major depression 06/01/2012  . Hypothyroidism 02/23/2009  . Essential hypertension 02/23/2009  . HYPERGLYCEMIA 02/23/2009    Jule Ser, PT 10/29/2019, 2:03 PM  [facility] [address], [place], [place], [address] Phone: [phone]   Fax:  [phone]  Name: Mary Graves MRN: [id number] Date of Birth: Dec 23, 1945

## 2019-10-29 NOTE — Patient Instructions (Signed)
Access Code: [email or web address] URL: [email or web address] Date: 10/29/2019 Prepared by: Jari Favre  Exercises Seated Cervical Retraction - 2 x daily - 7 x weekly - 10 reps - 1 sets Supine Chin Tuck - 1 x daily - 7 x weekly - 10 reps - 1 sets Seated Cervical Sidebending Stretch - 1 x daily - 7 x weekly - 3 reps - 1 sets - 30 hold Neck Rotation - 1 x daily - 7 x weekly - 3 reps - 1 sets - 30 hold Thoracic Extension Mobilization with Noodle - 1 x daily - 7 x weekly - 10 reps - 3 sets Supine Shoulder External Rotation on Foam Roll with Theraband - 1 x daily - 7 x weekly - 10 reps - 3 sets Supine Shoulder Horizontal Abduction with Resistance - 1 x daily - 7 x weekly - 10 reps - 3 sets Seated Upper Extremity Diagonals with Resistance on Swiss Ball - 1 x daily - 7 x weekly - 10 reps - 3 sets Reclined Shoulder Press with Resistance - 1 x daily - 7 x weekly - 10 reps - 3 sets

## 2019-11-05 ENCOUNTER — Ambulatory Visit: Payer: PPO | Attending: Family Medicine

## 2019-11-05 ENCOUNTER — Other Ambulatory Visit: Payer: Self-pay

## 2019-11-05 DIAGNOSIS — R252 Cramp and spasm: Secondary | ICD-10-CM | POA: Diagnosis not present

## 2019-11-05 DIAGNOSIS — M6281 Muscle weakness (generalized): Secondary | ICD-10-CM

## 2019-11-05 DIAGNOSIS — M542 Cervicalgia: Secondary | ICD-10-CM | POA: Diagnosis not present

## 2019-11-05 NOTE — Therapy (Signed)
[facility] [address] [place], [place], [address] Phone: [phone]   Fax:  [phone]  Physical Therapy Treatment  Patient Details  Name: Mary Graves MRN: [id number] Date of Birth: [birth year] Referring Provider (PT): Cari Caraway, MD   Encounter Date: 11/05/2019  PT End of Session - 11/05/19 1159    Visit Number  3    Date for PT Re-Evaluation  12/19/19    Authorization Type  medicaid - [employer]    PT Start Time  1110    PT Stop Time  1159    PT Time Calculation (min)  49 min    Activity Tolerance  Patient tolerated treatment well    Behavior During Therapy  [facility] for tasks assessed/performed       Past Medical History:  Diagnosis Date  . Alcoholism ([place])   . Hypertension   . Hypothyroidism   . Pancreatitis     Past Surgical History:  Procedure Laterality Date  . KNEE SURGERY Left    x 2  . NECK SURGERY     bilateral facet arthrosis  . TONSILLECTOMY      There were no vitals filed for this visit.  Subjective Assessment - 11/05/19 1109    Subjective  Dry needling is helping.  I have been doing my exercises.    Currently in Pain?  No/denies                       [facility] Adult PT Treatment/Exercise - 11/05/19 0001      Shoulder Exercises: Supine   Horizontal ABduction  Strengthening;Both;Theraband;15 reps    Theraband Level (Shoulder Horizontal ABduction)  Level 1 (Yellow)    External Rotation  Strengthening;Both;Theraband;15 reps    Theraband Level (Shoulder External Rotation)  Level 1 (Yellow)    Flexion  Strengthening;Both;Theraband;15 reps    Flexion Limitations  yellow band    Diagonals  Strengthening;Both;20 reps;Theraband    Theraband Level (Shoulder Diagonals)  Level 1 (Yellow)      Manual Therapy   Manual Therapy  Soft tissue mobilization    Soft tissue mobilization  manual and skilled Palpation to cervical musculature       Trigger Point Dry  Needling - 11/05/19 0001    Consent Given?  Yes    Education Handout Provided  Previously provided    Muscles Treated Head and Neck  Upper trapezius;Cervical multifidi;Suboccipitals    Upper Trapezius Response  Twitch reponse elicited;Palpable increased muscle length    Suboccipitals Response  Palpable increased muscle length    Cervical multifidi Response  Palpable increased muscle length             PT Short Term Goals - 11/05/19 1110      PT SHORT TERM GOAL #1   Title  The patient will demonstrate knowledge of initial HEP and basic self care including postural correction    Status  Achieved        PT Long Term Goals - 11/05/19 1110      PT LONG TERM GOAL #2   Title  The patient will report a 60% improvement in neck pain with walking and driving    Baseline  70% improvement    Time  8    Period  Weeks    Status  Achieved            Plan - 11/05/19 1116    Clinical Impression Statement  Pt reports 70% overall improvement in  neck symptoms since the start of care.  Pt is independent in HEP for flexibility and postural strength.  Pt with tension in suboccipitals, multifidi and upper traps. Pt demonstrated improved tissue mobility and reduced tension after manual and dry needling today.  Pt will continue to benefit from skilled PT to address postural strength, tissue mobility and flexibility.    Personal Factors and Comorbidities  Time since onset of injury/illness/exacerbation;Past/Current Experience    Stability/Clinical Decision Making  Stable/Uncomplicated    Rehab Potential  Excellent    PT Frequency  1x / week    PT Duration  8 weeks    PT Treatment/Interventions  ADLs/Self Care Home Management;Biofeedback;Cryotherapy;Electrical Stimulation;Moist Heat;Traction;Neuromuscular re-education;Therapeutic exercise;Therapeutic activities;Patient/family education;Taping;Passive range of motion;Dry needling;Manual techniques    PT Next Visit Plan  f/u on DN and HEP; progress  strength with lifting techniques    PT Home Exercise Plan  Access Code: [email or web address]       Patient will benefit from skilled therapeutic intervention in order to improve the following deficits and impairments:  Decreased coordination, Decreased range of motion, Pain, Increased muscle spasms, Postural dysfunction, Decreased strength  Visit Diagnosis: Cervicalgia  Cramp and spasm  Muscle weakness (generalized)     Problem List Patient Active Problem List   Diagnosis Date Noted  . Elevated troponin 07/11/2014  . Syncope 07/11/2014  . PTSD (post-traumatic stress disorder) 10/31/2013  . Alcoholism in family 10/31/2013  . Dementia ([place]) 10/09/2013  . HLD (hyperlipidemia) 02/27/2013  . Osteopenia 02/27/2013  . Alcohol dependence ([place]) 06/01/2012  . Major depression 06/01/2012  . Hypothyroidism 02/23/2009  . Essential hypertension 02/23/2009  . HYPERGLYCEMIA 02/23/2009    Mary Graves, PT 11/05/19 12:02 PM  [facility] [address] [place], [place], [address] Phone: [phone]   Fax:  [phone]  Name: Mary Graves MRN: [id number] Date of Birth: 01-01-1946

## 2019-11-06 NOTE — Addendum Note (Signed)
Addended by: Su Hoff on: 11/06/2019 10:32 AM   Modules accepted: Orders

## 2019-11-20 ENCOUNTER — Ambulatory Visit: Payer: PPO

## 2019-11-20 ENCOUNTER — Other Ambulatory Visit: Payer: Self-pay

## 2019-11-20 DIAGNOSIS — M542 Cervicalgia: Secondary | ICD-10-CM | POA: Diagnosis not present

## 2019-11-20 DIAGNOSIS — M6281 Muscle weakness (generalized): Secondary | ICD-10-CM

## 2019-11-20 DIAGNOSIS — R252 Cramp and spasm: Secondary | ICD-10-CM

## 2019-11-20 NOTE — Therapy (Signed)
[facility] [address] [place], [place], [address] Phone: [phone]   Fax:  [phone]  Physical Therapy Treatment  Patient Details  Name: Mary Graves MRN: [id number] Date of Birth: [birth year] Referring Provider (PT): Cari Caraway, MD   Encounter Date: 11/20/2019  PT End of Session - 11/20/19 1228    Visit Number  4    Date for PT Re-Evaluation  12/19/19    Authorization Type  medicaid - [employer]    PT Start Time  1147   dry needling   PT Stop Time  1226    PT Time Calculation (min)  39 min       Past Medical History:  Diagnosis Date  . Alcoholism ([place])   . Hypertension   . Hypothyroidism   . Pancreatitis     Past Surgical History:  Procedure Laterality Date  . KNEE SURGERY Left    x 2  . NECK SURGERY     bilateral facet arthrosis  . TONSILLECTOMY      There were no vitals filed for this visit.  Subjective Assessment - 11/20/19 1150    Subjective  I traveled and saw my family.  I havent done much of my exercises and I need to get back to it.  I feel 70% better overall.    Currently in Pain?  No/denies         [facility] PT Assessment - 11/20/19 0001      AROM   Cervical - Right Side Bend  40    Cervical - Left Side Bend  35    Cervical - Right Rotation  55    Cervical - Left Rotation  55                   [facility] Adult PT Treatment/Exercise - 11/20/19 0001      Shoulder Exercises: Supine   Horizontal ABduction  Strengthening;Both;Theraband;15 reps    Theraband Level (Shoulder Horizontal ABduction)  Level 1 (Yellow)    External Rotation  Strengthening;Both;Theraband;15 reps    Theraband Level (Shoulder External Rotation)  Level 1 (Yellow)    Flexion  Strengthening;Both;Theraband;15 reps    Flexion Limitations  yellow band    Diagonals  Strengthening;Both;20 reps;Theraband    Theraband Level (Shoulder Diagonals)  Level 1 (Yellow)      Shoulder Exercises: Seated    Other Seated Exercises  cervical sidebending and rotation 3x 20 seconds       Manual Therapy   Manual Therapy  Soft tissue mobilization    Manual therapy comments  elongation to bil neck and upper traps    Soft tissue mobilization  manual and skilled Palpation to cervical musculature       Trigger Point Dry Needling - 11/20/19 0001    Consent Given?  Yes    Education Handout Provided  Previously provided    Muscles Treated Head and Neck  Cervical multifidi    Upper Trapezius Response  --   pt didn't tolerate needling well so only multifidi   Suboccipitals Response  Palpable increased muscle length    Cervical multifidi Response  --             PT Short Term Goals - 11/05/19 1110      PT SHORT TERM GOAL #1   Title  The patient will demonstrate knowledge of initial HEP and basic self care including postural correction    Status  Achieved  PT Long Term Goals - 11/20/19 1155      PT LONG TERM GOAL #1   Title  The patient will be independent with a HEP for cervical ROM, shoulder and scapular muscle strengthening program    Time  8    Period  Weeks    Status  On-going      PT LONG TERM GOAL #2   Title  The patient will report a 60% improvement in neck pain with walking and driving    Baseline  70% improvement    Status  Achieved            Plan - 11/20/19 1207    Clinical Impression Statement  Pt reports 70% overall improvement in cervical symptoms since the start of care. Pt had a lapse in treatment due to travel and has not been as consistent with his HEP.  PT reviewed HEP with pt and she demonstrated all aspects correctly.  Pt with tension and trigger points in bil neck and upper traps and demonstrated improved tissue mobility and reduced tension after manual therapy.  Pt with reduced cervical A/ROM although improved from evaluation.  Pt will continue to benefit from skilled PT 1x/wk for dry needling and manual in addition to advancement of HEP.    PT  Frequency  1x / week    PT Duration  8 weeks    PT Treatment/Interventions  ADLs/Self Care Home Management;Biofeedback;Cryotherapy;Electrical Stimulation;Moist Heat;Traction;Neuromuscular re-education;Therapeutic exercise;Therapeutic activities;Patient/family education;Taping;Passive range of motion;Dry needling;Manual techniques    PT Next Visit Plan  f/u on DN and HEP; progress strength with lifting techniques    PT Home Exercise Plan  Access Code: [email or web address]    Consulted and Agree with Plan of Care  Patient       Patient will benefit from skilled therapeutic intervention in order to improve the following deficits and impairments:  Decreased coordination, Decreased range of motion, Pain, Increased muscle spasms, Postural dysfunction, Decreased strength  Visit Diagnosis: Cramp and spasm  Cervicalgia  Muscle weakness (generalized)     Problem List Patient Active Problem List   Diagnosis Date Noted  . Elevated troponin 07/11/2014  . Syncope 07/11/2014  . PTSD (post-traumatic stress disorder) 10/31/2013  . Alcoholism in family 10/31/2013  . Dementia ([place]) 10/09/2013  . HLD (hyperlipidemia) 02/27/2013  . Osteopenia 02/27/2013  . Alcohol dependence ([place]) 06/01/2012  . Major depression 06/01/2012  . Hypothyroidism 02/23/2009  . Essential hypertension 02/23/2009  . HYPERGLYCEMIA 02/23/2009   Sigurd Sos, PT 11/20/19 12:30 PM  [facility] [address] [place], [place], [address] Phone: [phone]   Fax:  [phone]  Name: Mary Graves MRN: [id number] Date of Birth: 07-23-1946

## 2019-11-25 ENCOUNTER — Other Ambulatory Visit: Payer: Self-pay

## 2019-11-25 ENCOUNTER — Ambulatory Visit: Payer: PPO

## 2019-11-25 DIAGNOSIS — M542 Cervicalgia: Secondary | ICD-10-CM | POA: Diagnosis not present

## 2019-11-25 DIAGNOSIS — M6281 Muscle weakness (generalized): Secondary | ICD-10-CM

## 2019-11-25 DIAGNOSIS — R252 Cramp and spasm: Secondary | ICD-10-CM

## 2019-11-25 NOTE — Therapy (Signed)
[facility] [address] [place], [place], [address] Phone: [phone]   Fax:  [phone]  Physical Therapy Treatment  Patient Details  Name: Mary Graves MRN: [id number] Date of Birth: 08/19/1945 Referring Provider (PT): Cari Caraway, MD   Encounter Date: 11/25/2019  PT End of Session - 11/25/19 1233    Visit Number  5    Date for PT Re-Evaluation  12/19/19    Authorization Type  medicaid - [employer]    PT Start Time  1017    PT Stop Time  1059    PT Time Calculation (min)  42 min    Activity Tolerance  Patient tolerated treatment well    Behavior During Therapy  [facility] for tasks assessed/performed       Past Medical History:  Diagnosis Date  . Alcoholism ([place])   . Hypertension   . Hypothyroidism   . Pancreatitis     Past Surgical History:  Procedure Laterality Date  . KNEE SURGERY Left    x 2  . NECK SURGERY     bilateral facet arthrosis  . TONSILLECTOMY      There were no vitals filed for this visit.  Subjective Assessment - 11/25/19 1230    Subjective  I am more consistent with my exercises since I am home from my trip.    Currently in Pain?  No/denies                       [facility] Adult PT Treatment/Exercise - 11/25/19 0001      Shoulder Exercises: Supine   Horizontal ABduction  Strengthening;Both;Theraband;15 reps    Theraband Level (Shoulder Horizontal ABduction)  Level 1 (Yellow)    Horizontal ABduction Limitations  on foam roll    External Rotation  Strengthening;Both;Theraband;15 reps    Theraband Level (Shoulder External Rotation)  Level 1 (Yellow)    External Rotation Limitations  on foam roll    Flexion  Strengthening;Both;Theraband;15 reps    Flexion Limitations  yellow band- on foam roll    Diagonals  Strengthening;Both;20 reps;Theraband    Theraband Level (Shoulder Diagonals)  Level 1 (Yellow)    Diagonals Limitations  on foam roll    Other Supine  Exercises  Melt Method: neck series       Manual Therapy   Manual Therapy  Soft tissue mobilization    Manual therapy comments  elongation to bil neck and upper traps               PT Short Term Goals - 11/05/19 1110      PT SHORT TERM GOAL #1   Title  The patient will demonstrate knowledge of initial HEP and basic self care including postural correction    Status  Achieved        PT Long Term Goals - 11/20/19 1155      PT LONG TERM GOAL #1   Title  The patient will be independent with a HEP for cervical ROM, shoulder and scapular muscle strengthening program    Time  8    Period  Weeks    Status  On-going      PT LONG TERM GOAL #2   Title  The patient will report a 60% improvement in neck pain with walking and driving    Baseline  70% improvement    Status  Achieved            Plan - 11/25/19 1231  Clinical Impression Statement  Pt reports 70% overall improvement in cervical symptoms since the start of care.  Pt has become more consistent with HEP with theraband for postural strength. Pt requires minor verbal cues for alignment and technique with exercises. Pt demonstrated core instability with supine theraband on foam roll. Pt with tension in bil upper traps and demonstrated and reported improved tissue mobility after manual therapy today.   Pt will continue to benefit from skilled PT to address strength, flexibility and manual therapy to address cervical/thoracic tissue mobility.    PT Frequency  1x / week    PT Duration  8 weeks    PT Treatment/Interventions  ADLs/Self Care Home Management;Biofeedback;Cryotherapy;Electrical Stimulation;Moist Heat;Traction;Neuromuscular re-education;Therapeutic exercise;Therapeutic activities;Patient/family education;Taping;Passive range of motion;Dry needling;Manual techniques    PT Next Visit Plan  dry needling if pt wants, manual therapy, postural strength    PT Home Exercise Plan  Access Code: [email or web address]    Recommended Other  Services  initial cert is signed    Consulted and Agree with Plan of Care  Patient       Patient will benefit from skilled therapeutic intervention in order to improve the following deficits and impairments:  Decreased coordination, Decreased range of motion, Pain, Increased muscle spasms, Postural dysfunction, Decreased strength  Visit Diagnosis: Cervicalgia  Cramp and spasm  Muscle weakness (generalized)     Problem List Patient Active Problem List   Diagnosis Date Noted  . Elevated troponin 07/11/2014  . Syncope 07/11/2014  . PTSD (post-traumatic stress disorder) 10/31/2013  . Alcoholism in family 10/31/2013  . Dementia ([place]) 10/09/2013  . HLD (hyperlipidemia) 02/27/2013  . Osteopenia 02/27/2013  . Alcohol dependence ([place]) 06/01/2012  . Major depression 06/01/2012  . Hypothyroidism 02/23/2009  . Essential hypertension 02/23/2009  . HYPERGLYCEMIA 02/23/2009    Sigurd Sos, PT 11/25/19 12:34 PM  [facility] [address], [place], [place], [address] Phone: [phone]   Fax:  [phone]  Name: Mary Graves MRN: [id number] Date of Birth: [birth year]

## 2019-12-02 DIAGNOSIS — M810 Age-related osteoporosis without current pathological fracture: Secondary | ICD-10-CM | POA: Diagnosis not present

## 2019-12-02 DIAGNOSIS — E0500 Thyrotoxicosis with diffuse goiter without thyrotoxic crisis or storm: Secondary | ICD-10-CM | POA: Diagnosis not present

## 2019-12-02 DIAGNOSIS — F1020 Alcohol dependence, uncomplicated: Secondary | ICD-10-CM | POA: Diagnosis not present

## 2019-12-02 DIAGNOSIS — F419 Anxiety disorder, unspecified: Secondary | ICD-10-CM | POA: Diagnosis not present

## 2019-12-02 DIAGNOSIS — N1832 Chronic kidney disease, stage 3b: Secondary | ICD-10-CM | POA: Diagnosis not present

## 2019-12-02 DIAGNOSIS — E039 Hypothyroidism, unspecified: Secondary | ICD-10-CM | POA: Diagnosis not present

## 2019-12-02 DIAGNOSIS — J309 Allergic rhinitis, unspecified: Secondary | ICD-10-CM | POA: Diagnosis not present

## 2019-12-02 DIAGNOSIS — E559 Vitamin D deficiency, unspecified: Secondary | ICD-10-CM | POA: Diagnosis not present

## 2019-12-02 DIAGNOSIS — I10 Essential (primary) hypertension: Secondary | ICD-10-CM | POA: Diagnosis not present

## 2019-12-02 DIAGNOSIS — Z0000 Encounter for general adult medical examination without abnormal findings: Secondary | ICD-10-CM | POA: Diagnosis not present

## 2019-12-02 DIAGNOSIS — E785 Hyperlipidemia, unspecified: Secondary | ICD-10-CM | POA: Diagnosis not present

## 2019-12-04 ENCOUNTER — Ambulatory Visit: Payer: PPO | Attending: Family Medicine

## 2019-12-04 ENCOUNTER — Other Ambulatory Visit: Payer: Self-pay

## 2019-12-04 DIAGNOSIS — M6281 Muscle weakness (generalized): Secondary | ICD-10-CM | POA: Diagnosis not present

## 2019-12-04 DIAGNOSIS — M542 Cervicalgia: Secondary | ICD-10-CM | POA: Diagnosis not present

## 2019-12-04 DIAGNOSIS — R252 Cramp and spasm: Secondary | ICD-10-CM | POA: Diagnosis not present

## 2019-12-04 NOTE — Therapy (Signed)
[facility] [address] [place], [place], [address] Phone: [phone]   Fax:  [phone]  Physical Therapy Treatment  Patient Details  Name: Mary Graves MRN: [id number] Date of Birth: [birth year] Referring Provider (PT): Cari Caraway, MD   Encounter Date: 12/04/2019  PT End of Session - 12/04/19 1050    Visit Number  6    Date for PT Re-Evaluation  12/19/19    Authorization Type  medicaid - [employer]    PT Start Time  1017    PT Stop Time  1051    PT Time Calculation (min)  34 min    Activity Tolerance  Patient tolerated treatment well    Behavior During Therapy  [facility] for tasks assessed/performed       Past Medical History:  Diagnosis Date  . Alcoholism ([place])   . Hypertension   . Hypothyroidism   . Pancreatitis     Past Surgical History:  Procedure Laterality Date  . KNEE SURGERY Left    x 2  . NECK SURGERY     bilateral facet arthrosis  . TONSILLECTOMY      There were no vitals filed for this visit.  Subjective Assessment - 12/04/19 1020    Subjective  I got a foam roll for use at home.  I am really motivated to use.  70% overall improvement in neck symptoms since the start of care.    Currently in Pain?  No/denies         [facility] PT Assessment - 12/04/19 0001      Assessment   Medical Diagnosis  M54.2 (ICD-10-CM) - Cervicalgia    Referring Provider (PT)  Cari Caraway, MD      Precautions   Precautions  None      Restrictions   Weight Bearing Restrictions  No      AROM   Cervical Flexion  55    Cervical - Right Side Bend  40    Cervical - Left Side Bend  35    Cervical - Right Rotation  50    Cervical - Left Rotation  60      Strength   Cervical - Right Rotation  4/5    Cervical - Left Rotation  4/5                   [facility] Adult PT Treatment/Exercise - 12/04/19 0001      Shoulder Exercises: Supine   Horizontal ABduction   Strengthening;Both;Theraband;15 reps    Theraband Level (Shoulder Horizontal ABduction)  Level 1 (Yellow)    Horizontal ABduction Limitations  on foam roll    External Rotation  Strengthening;Both;Theraband;15 reps    Theraband Level (Shoulder External Rotation)  Level 1 (Yellow)    External Rotation Limitations  on foam roll    Flexion  Strengthening;Both;Theraband;15 reps    Flexion Limitations  yellow band- on foam roll    Diagonals  Strengthening;Both;20 reps;Theraband    Theraband Level (Shoulder Diagonals)  Level 1 (Yellow)    Diagonals Limitations  on foam roll    Other Supine Exercises  Melt Method: neck series                PT Short Term Goals - 11/05/19 1110      PT SHORT TERM GOAL #1   Title  The patient will demonstrate knowledge of initial HEP and basic self care including postural correction    Status  Achieved  PT Long Term Goals - 12/04/19 1018      PT LONG TERM GOAL #1   Title  The patient will be independent with a HEP for cervical ROM, shoulder and scapular muscle strengthening program    Status  Achieved      PT LONG TERM GOAL #2   Title  The patient will report a 60% improvement in neck pain with walking and driving    Baseline  70% improvement    Status  Achieved      PT LONG TERM GOAL #3   Title  Cervical flexion and extension ROM improved to 40 degrees, bil rotation to 50 degrees needed for driving    Status  Achieved      PT LONG TERM GOAL #4   Title  The patient will have improved cervical rotation and periscapular strength to 4+/5 needed for lifting and carrying light to medium objects    Status  Partially Met      PT LONG TERM GOAL #5   Title  FOTO functional outcome score improved to 37% indicating improved function with less pain    Baseline  28% limitation    Status  Achieved            Plan - 12/04/19 1036    Clinical Impression Statement  Pt reports 70% overall improvement in cervical symptoms since the start of  care.  Pt has a comprehensive HEP in place to address cervical A/ROM and postural strength.  Pt with improved cervical A/ROM overall and has functional A/ROM for driving.  FOTO has improved to 28% limitation (improved from 45% limitation at evaluation).  Pt will D/C to HEP and will follow-up with MD as needed.    PT Next Visit Plan  D/C PT to HEP    PT Home Exercise Plan  Access Code: [email or web address]    Consulted and Agree with Plan of Care  Patient       Patient will benefit from skilled therapeutic intervention in order to improve the following deficits and impairments:     Visit Diagnosis: Cervicalgia  Cramp and spasm  Muscle weakness (generalized)     Problem List Patient Active Problem List   Diagnosis Date Noted  . Elevated troponin 07/11/2014  . Syncope 07/11/2014  . PTSD (post-traumatic stress disorder) 10/31/2013  . Alcoholism in family 10/31/2013  . Dementia ([place]) 10/09/2013  . HLD (hyperlipidemia) 02/27/2013  . Osteopenia 02/27/2013  . Alcohol dependence ([place]) 06/01/2012  . Major depression 06/01/2012  . Hypothyroidism 02/23/2009  . Essential hypertension 02/23/2009  . HYPERGLYCEMIA 02/23/2009   PHYSICAL THERAPY DISCHARGE SUMMARY  Visits from Start of Care: 6  Current functional level related to goals / functional outcomes: See above for current PT status.     Remaining deficits: Intermittent cervical pain and functional postural weakness.  Pt has HEP in place to address strength and flexbility.     Education / Equipment: HEP, posture  Plan: Patient agrees to discharge.  Patient goals were met. Patient is being discharged due to meeting the stated rehab goals.  ?????         Sigurd Sos, PT 12/04/19 10:53 AM   [facility] [address], [place], [place], [address] Phone: [phone]   Fax:  [phone]  Name: Mary Graves MRN: [id number] Date of Birth: 1945/09/13

## 2020-01-16 DIAGNOSIS — E039 Hypothyroidism, unspecified: Secondary | ICD-10-CM | POA: Diagnosis not present

## 2020-01-24 DIAGNOSIS — D3705 Neoplasm of uncertain behavior of pharynx: Secondary | ICD-10-CM | POA: Diagnosis not present

## 2020-02-17 DIAGNOSIS — R220 Localized swelling, mass and lump, head: Secondary | ICD-10-CM | POA: Diagnosis not present

## 2020-02-17 DIAGNOSIS — K1370 Unspecified lesions of oral mucosa: Secondary | ICD-10-CM | POA: Diagnosis not present

## 2020-02-17 DIAGNOSIS — C052 Malignant neoplasm of uvula: Secondary | ICD-10-CM | POA: Diagnosis not present

## 2020-02-25 DIAGNOSIS — K449 Diaphragmatic hernia without obstruction or gangrene: Secondary | ICD-10-CM | POA: Diagnosis not present

## 2020-02-25 DIAGNOSIS — R918 Other nonspecific abnormal finding of lung field: Secondary | ICD-10-CM | POA: Diagnosis not present

## 2020-02-25 DIAGNOSIS — I2510 Atherosclerotic heart disease of native coronary artery without angina pectoris: Secondary | ICD-10-CM | POA: Diagnosis not present

## 2020-02-25 DIAGNOSIS — I700 Atherosclerosis of aorta: Secondary | ICD-10-CM | POA: Diagnosis not present

## 2020-02-25 DIAGNOSIS — I77810 Thoracic aortic ectasia: Secondary | ICD-10-CM | POA: Diagnosis not present

## 2020-02-25 DIAGNOSIS — C052 Malignant neoplasm of uvula: Secondary | ICD-10-CM | POA: Diagnosis not present

## 2020-02-27 DIAGNOSIS — F1020 Alcohol dependence, uncomplicated: Secondary | ICD-10-CM | POA: Diagnosis not present

## 2020-02-27 DIAGNOSIS — Z0000 Encounter for general adult medical examination without abnormal findings: Secondary | ICD-10-CM | POA: Diagnosis not present

## 2020-02-27 DIAGNOSIS — J309 Allergic rhinitis, unspecified: Secondary | ICD-10-CM | POA: Diagnosis not present

## 2020-02-27 DIAGNOSIS — C069 Malignant neoplasm of mouth, unspecified: Secondary | ICD-10-CM | POA: Diagnosis not present

## 2020-02-27 DIAGNOSIS — E785 Hyperlipidemia, unspecified: Secondary | ICD-10-CM | POA: Diagnosis not present

## 2020-02-27 DIAGNOSIS — I10 Essential (primary) hypertension: Secondary | ICD-10-CM | POA: Diagnosis not present

## 2020-02-27 DIAGNOSIS — E039 Hypothyroidism, unspecified: Secondary | ICD-10-CM | POA: Diagnosis not present

## 2020-02-27 DIAGNOSIS — M810 Age-related osteoporosis without current pathological fracture: Secondary | ICD-10-CM | POA: Diagnosis not present

## 2020-02-27 DIAGNOSIS — E559 Vitamin D deficiency, unspecified: Secondary | ICD-10-CM | POA: Diagnosis not present

## 2020-02-27 DIAGNOSIS — E0500 Thyrotoxicosis with diffuse goiter without thyrotoxic crisis or storm: Secondary | ICD-10-CM | POA: Diagnosis not present

## 2020-02-27 DIAGNOSIS — N1832 Chronic kidney disease, stage 3b: Secondary | ICD-10-CM | POA: Diagnosis not present

## 2020-03-05 DIAGNOSIS — E0500 Thyrotoxicosis with diffuse goiter without thyrotoxic crisis or storm: Secondary | ICD-10-CM | POA: Diagnosis not present

## 2020-03-05 DIAGNOSIS — F1020 Alcohol dependence, uncomplicated: Secondary | ICD-10-CM | POA: Diagnosis not present

## 2020-03-05 DIAGNOSIS — E039 Hypothyroidism, unspecified: Secondary | ICD-10-CM | POA: Diagnosis not present

## 2020-03-05 DIAGNOSIS — Z0000 Encounter for general adult medical examination without abnormal findings: Secondary | ICD-10-CM | POA: Diagnosis not present

## 2020-03-05 DIAGNOSIS — E559 Vitamin D deficiency, unspecified: Secondary | ICD-10-CM | POA: Diagnosis not present

## 2020-03-05 DIAGNOSIS — J309 Allergic rhinitis, unspecified: Secondary | ICD-10-CM | POA: Diagnosis not present

## 2020-03-05 DIAGNOSIS — F419 Anxiety disorder, unspecified: Secondary | ICD-10-CM | POA: Diagnosis not present

## 2020-03-05 DIAGNOSIS — I10 Essential (primary) hypertension: Secondary | ICD-10-CM | POA: Diagnosis not present

## 2020-03-05 DIAGNOSIS — M810 Age-related osteoporosis without current pathological fracture: Secondary | ICD-10-CM | POA: Diagnosis not present

## 2020-03-05 DIAGNOSIS — E785 Hyperlipidemia, unspecified: Secondary | ICD-10-CM | POA: Diagnosis not present

## 2020-03-05 DIAGNOSIS — N1832 Chronic kidney disease, stage 3b: Secondary | ICD-10-CM | POA: Diagnosis not present

## 2020-03-10 DIAGNOSIS — C052 Malignant neoplasm of uvula: Secondary | ICD-10-CM | POA: Diagnosis not present

## 2020-03-10 DIAGNOSIS — C051 Malignant neoplasm of soft palate: Secondary | ICD-10-CM | POA: Diagnosis not present

## 2020-03-18 DIAGNOSIS — C051 Malignant neoplasm of soft palate: Secondary | ICD-10-CM | POA: Diagnosis not present

## 2020-03-18 DIAGNOSIS — Z483 Aftercare following surgery for neoplasm: Secondary | ICD-10-CM | POA: Diagnosis not present

## 2020-03-27 DIAGNOSIS — M810 Age-related osteoporosis without current pathological fracture: Secondary | ICD-10-CM | POA: Diagnosis not present

## 2020-04-01 ENCOUNTER — Other Ambulatory Visit: Payer: Self-pay | Admitting: Family Medicine

## 2020-04-01 DIAGNOSIS — Z08 Encounter for follow-up examination after completed treatment for malignant neoplasm: Secondary | ICD-10-CM | POA: Diagnosis not present

## 2020-04-01 DIAGNOSIS — Z85818 Personal history of malignant neoplasm of other sites of lip, oral cavity, and pharynx: Secondary | ICD-10-CM | POA: Diagnosis not present

## 2020-04-01 DIAGNOSIS — Z1231 Encounter for screening mammogram for malignant neoplasm of breast: Secondary | ICD-10-CM

## 2020-04-10 DIAGNOSIS — Z23 Encounter for immunization: Secondary | ICD-10-CM | POA: Diagnosis not present

## 2020-04-22 ENCOUNTER — Other Ambulatory Visit: Payer: Self-pay

## 2020-04-22 ENCOUNTER — Ambulatory Visit
Admission: RE | Admit: 2020-04-22 | Discharge: 2020-04-22 | Disposition: A | Discharge status: Home / Self Care | Payer: PPO | Source: Ambulatory Visit | Attending: Family Medicine | Admitting: Family Medicine

## 2020-04-22 DIAGNOSIS — Z1231 Encounter for screening mammogram for malignant neoplasm of breast: Secondary | ICD-10-CM

## 2020-06-03 DIAGNOSIS — Z08 Encounter for follow-up examination after completed treatment for malignant neoplasm: Secondary | ICD-10-CM | POA: Diagnosis not present

## 2020-06-03 DIAGNOSIS — Z85818 Personal history of malignant neoplasm of other sites of lip, oral cavity, and pharynx: Secondary | ICD-10-CM | POA: Diagnosis not present

## 2020-06-29 DIAGNOSIS — S46911A Strain of unspecified muscle, fascia and tendon at shoulder and upper arm level, right arm, initial encounter: Secondary | ICD-10-CM | POA: Diagnosis not present

## 2020-07-14 DIAGNOSIS — M7541 Impingement syndrome of right shoulder: Secondary | ICD-10-CM | POA: Diagnosis not present

## 2020-07-14 DIAGNOSIS — M2560 Stiffness of unspecified joint, not elsewhere classified: Secondary | ICD-10-CM | POA: Diagnosis not present

## 2020-07-14 DIAGNOSIS — M6281 Muscle weakness (generalized): Secondary | ICD-10-CM | POA: Diagnosis not present

## 2020-07-14 DIAGNOSIS — M7521 Bicipital tendinitis, right shoulder: Secondary | ICD-10-CM | POA: Diagnosis not present

## 2020-07-16 DIAGNOSIS — M7541 Impingement syndrome of right shoulder: Secondary | ICD-10-CM | POA: Diagnosis not present

## 2020-07-16 DIAGNOSIS — M7521 Bicipital tendinitis, right shoulder: Secondary | ICD-10-CM | POA: Diagnosis not present

## 2020-07-16 DIAGNOSIS — M6281 Muscle weakness (generalized): Secondary | ICD-10-CM | POA: Diagnosis not present

## 2020-07-16 DIAGNOSIS — M2560 Stiffness of unspecified joint, not elsewhere classified: Secondary | ICD-10-CM | POA: Diagnosis not present

## 2020-07-20 DIAGNOSIS — M2560 Stiffness of unspecified joint, not elsewhere classified: Secondary | ICD-10-CM | POA: Diagnosis not present

## 2020-07-20 DIAGNOSIS — M7541 Impingement syndrome of right shoulder: Secondary | ICD-10-CM | POA: Diagnosis not present

## 2020-07-20 DIAGNOSIS — M7521 Bicipital tendinitis, right shoulder: Secondary | ICD-10-CM | POA: Diagnosis not present

## 2020-07-20 DIAGNOSIS — M6281 Muscle weakness (generalized): Secondary | ICD-10-CM | POA: Diagnosis not present

## 2020-07-23 DIAGNOSIS — M6281 Muscle weakness (generalized): Secondary | ICD-10-CM | POA: Diagnosis not present

## 2020-07-23 DIAGNOSIS — M7521 Bicipital tendinitis, right shoulder: Secondary | ICD-10-CM | POA: Diagnosis not present

## 2020-07-23 DIAGNOSIS — M7541 Impingement syndrome of right shoulder: Secondary | ICD-10-CM | POA: Diagnosis not present

## 2020-07-23 DIAGNOSIS — M2560 Stiffness of unspecified joint, not elsewhere classified: Secondary | ICD-10-CM | POA: Diagnosis not present

## 2020-07-30 DIAGNOSIS — M6281 Muscle weakness (generalized): Secondary | ICD-10-CM | POA: Diagnosis not present

## 2020-07-30 DIAGNOSIS — M7521 Bicipital tendinitis, right shoulder: Secondary | ICD-10-CM | POA: Diagnosis not present

## 2020-07-30 DIAGNOSIS — M7541 Impingement syndrome of right shoulder: Secondary | ICD-10-CM | POA: Diagnosis not present

## 2020-07-30 DIAGNOSIS — M2560 Stiffness of unspecified joint, not elsewhere classified: Secondary | ICD-10-CM | POA: Diagnosis not present

## 2020-08-04 DIAGNOSIS — M7521 Bicipital tendinitis, right shoulder: Secondary | ICD-10-CM | POA: Diagnosis not present

## 2020-08-04 DIAGNOSIS — M6281 Muscle weakness (generalized): Secondary | ICD-10-CM | POA: Diagnosis not present

## 2020-08-04 DIAGNOSIS — M7541 Impingement syndrome of right shoulder: Secondary | ICD-10-CM | POA: Diagnosis not present

## 2020-08-04 DIAGNOSIS — M2560 Stiffness of unspecified joint, not elsewhere classified: Secondary | ICD-10-CM | POA: Diagnosis not present

## 2020-08-07 DIAGNOSIS — M810 Age-related osteoporosis without current pathological fracture: Secondary | ICD-10-CM | POA: Diagnosis not present

## 2020-08-07 DIAGNOSIS — J309 Allergic rhinitis, unspecified: Secondary | ICD-10-CM | POA: Diagnosis not present

## 2020-08-07 DIAGNOSIS — M2560 Stiffness of unspecified joint, not elsewhere classified: Secondary | ICD-10-CM | POA: Diagnosis not present

## 2020-08-07 DIAGNOSIS — M7521 Bicipital tendinitis, right shoulder: Secondary | ICD-10-CM | POA: Diagnosis not present

## 2020-08-07 DIAGNOSIS — M75101 Unspecified rotator cuff tear or rupture of right shoulder, not specified as traumatic: Secondary | ICD-10-CM | POA: Diagnosis not present

## 2020-08-07 DIAGNOSIS — E039 Hypothyroidism, unspecified: Secondary | ICD-10-CM | POA: Diagnosis not present

## 2020-08-07 DIAGNOSIS — N1832 Chronic kidney disease, stage 3b: Secondary | ICD-10-CM | POA: Diagnosis not present

## 2020-08-07 DIAGNOSIS — I10 Essential (primary) hypertension: Secondary | ICD-10-CM | POA: Diagnosis not present

## 2020-08-07 DIAGNOSIS — C069 Malignant neoplasm of mouth, unspecified: Secondary | ICD-10-CM | POA: Diagnosis not present

## 2020-08-07 DIAGNOSIS — E0500 Thyrotoxicosis with diffuse goiter without thyrotoxic crisis or storm: Secondary | ICD-10-CM | POA: Diagnosis not present

## 2020-08-07 DIAGNOSIS — M6281 Muscle weakness (generalized): Secondary | ICD-10-CM | POA: Diagnosis not present

## 2020-08-07 DIAGNOSIS — F331 Major depressive disorder, recurrent, moderate: Secondary | ICD-10-CM | POA: Diagnosis not present

## 2020-08-07 DIAGNOSIS — E785 Hyperlipidemia, unspecified: Secondary | ICD-10-CM | POA: Diagnosis not present

## 2020-08-07 DIAGNOSIS — M7541 Impingement syndrome of right shoulder: Secondary | ICD-10-CM | POA: Diagnosis not present

## 2020-08-07 DIAGNOSIS — F1020 Alcohol dependence, uncomplicated: Secondary | ICD-10-CM | POA: Diagnosis not present

## 2020-08-11 DIAGNOSIS — M2560 Stiffness of unspecified joint, not elsewhere classified: Secondary | ICD-10-CM | POA: Diagnosis not present

## 2020-08-11 DIAGNOSIS — M6281 Muscle weakness (generalized): Secondary | ICD-10-CM | POA: Diagnosis not present

## 2020-08-11 DIAGNOSIS — M7521 Bicipital tendinitis, right shoulder: Secondary | ICD-10-CM | POA: Diagnosis not present

## 2020-08-11 DIAGNOSIS — M7541 Impingement syndrome of right shoulder: Secondary | ICD-10-CM | POA: Diagnosis not present

## 2020-08-13 ENCOUNTER — Ambulatory Visit: Payer: PPO

## 2020-08-13 DIAGNOSIS — Z808 Family history of malignant neoplasm of other organs or systems: Secondary | ICD-10-CM | POA: Diagnosis not present

## 2020-08-13 DIAGNOSIS — L578 Other skin changes due to chronic exposure to nonionizing radiation: Secondary | ICD-10-CM | POA: Diagnosis not present

## 2020-08-13 DIAGNOSIS — L309 Dermatitis, unspecified: Secondary | ICD-10-CM | POA: Diagnosis not present

## 2020-08-13 DIAGNOSIS — D225 Melanocytic nevi of trunk: Secondary | ICD-10-CM | POA: Diagnosis not present

## 2020-08-13 DIAGNOSIS — L821 Other seborrheic keratosis: Secondary | ICD-10-CM | POA: Diagnosis not present

## 2020-08-13 DIAGNOSIS — L570 Actinic keratosis: Secondary | ICD-10-CM | POA: Diagnosis not present

## 2020-08-13 DIAGNOSIS — L814 Other melanin hyperpigmentation: Secondary | ICD-10-CM | POA: Diagnosis not present

## 2020-08-14 DIAGNOSIS — M7541 Impingement syndrome of right shoulder: Secondary | ICD-10-CM | POA: Diagnosis not present

## 2020-08-14 DIAGNOSIS — M6281 Muscle weakness (generalized): Secondary | ICD-10-CM | POA: Diagnosis not present

## 2020-08-14 DIAGNOSIS — M7521 Bicipital tendinitis, right shoulder: Secondary | ICD-10-CM | POA: Diagnosis not present

## 2020-08-14 DIAGNOSIS — M2560 Stiffness of unspecified joint, not elsewhere classified: Secondary | ICD-10-CM | POA: Diagnosis not present

## 2020-08-21 DIAGNOSIS — M2560 Stiffness of unspecified joint, not elsewhere classified: Secondary | ICD-10-CM | POA: Diagnosis not present

## 2020-08-21 DIAGNOSIS — M6281 Muscle weakness (generalized): Secondary | ICD-10-CM | POA: Diagnosis not present

## 2020-08-21 DIAGNOSIS — M7541 Impingement syndrome of right shoulder: Secondary | ICD-10-CM | POA: Diagnosis not present

## 2020-08-21 DIAGNOSIS — M7521 Bicipital tendinitis, right shoulder: Secondary | ICD-10-CM | POA: Diagnosis not present

## 2020-08-25 DIAGNOSIS — M2560 Stiffness of unspecified joint, not elsewhere classified: Secondary | ICD-10-CM | POA: Diagnosis not present

## 2020-08-25 DIAGNOSIS — M7521 Bicipital tendinitis, right shoulder: Secondary | ICD-10-CM | POA: Diagnosis not present

## 2020-08-25 DIAGNOSIS — M6281 Muscle weakness (generalized): Secondary | ICD-10-CM | POA: Diagnosis not present

## 2020-08-25 DIAGNOSIS — M7541 Impingement syndrome of right shoulder: Secondary | ICD-10-CM | POA: Diagnosis not present

## 2020-08-28 DIAGNOSIS — M6281 Muscle weakness (generalized): Secondary | ICD-10-CM | POA: Diagnosis not present

## 2020-08-28 DIAGNOSIS — M7541 Impingement syndrome of right shoulder: Secondary | ICD-10-CM | POA: Diagnosis not present

## 2020-08-28 DIAGNOSIS — M2560 Stiffness of unspecified joint, not elsewhere classified: Secondary | ICD-10-CM | POA: Diagnosis not present

## 2020-08-28 DIAGNOSIS — M7521 Bicipital tendinitis, right shoulder: Secondary | ICD-10-CM | POA: Diagnosis not present

## 2020-08-31 DIAGNOSIS — M7541 Impingement syndrome of right shoulder: Secondary | ICD-10-CM | POA: Diagnosis not present

## 2020-08-31 DIAGNOSIS — M6281 Muscle weakness (generalized): Secondary | ICD-10-CM | POA: Diagnosis not present

## 2020-08-31 DIAGNOSIS — M7521 Bicipital tendinitis, right shoulder: Secondary | ICD-10-CM | POA: Diagnosis not present

## 2020-08-31 DIAGNOSIS — M2560 Stiffness of unspecified joint, not elsewhere classified: Secondary | ICD-10-CM | POA: Diagnosis not present

## 2020-09-02 DIAGNOSIS — Z85819 Personal history of malignant neoplasm of unspecified site of lip, oral cavity, and pharynx: Secondary | ICD-10-CM | POA: Diagnosis not present

## 2020-09-02 DIAGNOSIS — C051 Malignant neoplasm of soft palate: Secondary | ICD-10-CM | POA: Diagnosis not present

## 2020-09-03 DIAGNOSIS — M7541 Impingement syndrome of right shoulder: Secondary | ICD-10-CM | POA: Diagnosis not present

## 2020-09-03 DIAGNOSIS — M6281 Muscle weakness (generalized): Secondary | ICD-10-CM | POA: Diagnosis not present

## 2020-09-03 DIAGNOSIS — M2560 Stiffness of unspecified joint, not elsewhere classified: Secondary | ICD-10-CM | POA: Diagnosis not present

## 2020-09-03 DIAGNOSIS — M7521 Bicipital tendinitis, right shoulder: Secondary | ICD-10-CM | POA: Diagnosis not present

## 2020-09-07 DIAGNOSIS — M7541 Impingement syndrome of right shoulder: Secondary | ICD-10-CM | POA: Diagnosis not present

## 2020-09-07 DIAGNOSIS — M7521 Bicipital tendinitis, right shoulder: Secondary | ICD-10-CM | POA: Diagnosis not present

## 2020-09-07 DIAGNOSIS — M2560 Stiffness of unspecified joint, not elsewhere classified: Secondary | ICD-10-CM | POA: Diagnosis not present

## 2020-09-07 DIAGNOSIS — M6281 Muscle weakness (generalized): Secondary | ICD-10-CM | POA: Diagnosis not present

## 2020-09-10 DIAGNOSIS — M7541 Impingement syndrome of right shoulder: Secondary | ICD-10-CM | POA: Diagnosis not present

## 2020-09-10 DIAGNOSIS — M6281 Muscle weakness (generalized): Secondary | ICD-10-CM | POA: Diagnosis not present

## 2020-09-10 DIAGNOSIS — M7521 Bicipital tendinitis, right shoulder: Secondary | ICD-10-CM | POA: Diagnosis not present

## 2020-09-10 DIAGNOSIS — M2560 Stiffness of unspecified joint, not elsewhere classified: Secondary | ICD-10-CM | POA: Diagnosis not present

## 2020-09-14 DIAGNOSIS — M7541 Impingement syndrome of right shoulder: Secondary | ICD-10-CM | POA: Diagnosis not present

## 2020-09-14 DIAGNOSIS — M6281 Muscle weakness (generalized): Secondary | ICD-10-CM | POA: Diagnosis not present

## 2020-09-14 DIAGNOSIS — M2560 Stiffness of unspecified joint, not elsewhere classified: Secondary | ICD-10-CM | POA: Diagnosis not present

## 2020-09-14 DIAGNOSIS — M7521 Bicipital tendinitis, right shoulder: Secondary | ICD-10-CM | POA: Diagnosis not present

## 2020-09-17 DIAGNOSIS — M7541 Impingement syndrome of right shoulder: Secondary | ICD-10-CM | POA: Diagnosis not present

## 2020-09-17 DIAGNOSIS — M6281 Muscle weakness (generalized): Secondary | ICD-10-CM | POA: Diagnosis not present

## 2020-09-17 DIAGNOSIS — M2560 Stiffness of unspecified joint, not elsewhere classified: Secondary | ICD-10-CM | POA: Diagnosis not present

## 2020-09-17 DIAGNOSIS — M7521 Bicipital tendinitis, right shoulder: Secondary | ICD-10-CM | POA: Diagnosis not present

## 2020-09-21 DIAGNOSIS — M7541 Impingement syndrome of right shoulder: Secondary | ICD-10-CM | POA: Diagnosis not present

## 2020-09-21 DIAGNOSIS — M7521 Bicipital tendinitis, right shoulder: Secondary | ICD-10-CM | POA: Diagnosis not present

## 2020-09-21 DIAGNOSIS — M2560 Stiffness of unspecified joint, not elsewhere classified: Secondary | ICD-10-CM | POA: Diagnosis not present

## 2020-09-21 DIAGNOSIS — M6281 Muscle weakness (generalized): Secondary | ICD-10-CM | POA: Diagnosis not present

## 2020-09-24 DIAGNOSIS — M2560 Stiffness of unspecified joint, not elsewhere classified: Secondary | ICD-10-CM | POA: Diagnosis not present

## 2020-09-24 DIAGNOSIS — M6281 Muscle weakness (generalized): Secondary | ICD-10-CM | POA: Diagnosis not present

## 2020-09-24 DIAGNOSIS — M7541 Impingement syndrome of right shoulder: Secondary | ICD-10-CM | POA: Diagnosis not present

## 2020-09-24 DIAGNOSIS — M7521 Bicipital tendinitis, right shoulder: Secondary | ICD-10-CM | POA: Diagnosis not present

## 2020-09-29 DIAGNOSIS — M6281 Muscle weakness (generalized): Secondary | ICD-10-CM | POA: Diagnosis not present

## 2020-09-29 DIAGNOSIS — M7521 Bicipital tendinitis, right shoulder: Secondary | ICD-10-CM | POA: Diagnosis not present

## 2020-09-29 DIAGNOSIS — M2560 Stiffness of unspecified joint, not elsewhere classified: Secondary | ICD-10-CM | POA: Diagnosis not present

## 2020-09-29 DIAGNOSIS — M7541 Impingement syndrome of right shoulder: Secondary | ICD-10-CM | POA: Diagnosis not present

## 2020-10-01 DIAGNOSIS — M7521 Bicipital tendinitis, right shoulder: Secondary | ICD-10-CM | POA: Diagnosis not present

## 2020-10-01 DIAGNOSIS — M2560 Stiffness of unspecified joint, not elsewhere classified: Secondary | ICD-10-CM | POA: Diagnosis not present

## 2020-10-01 DIAGNOSIS — M810 Age-related osteoporosis without current pathological fracture: Secondary | ICD-10-CM | POA: Diagnosis not present

## 2020-10-01 DIAGNOSIS — M7541 Impingement syndrome of right shoulder: Secondary | ICD-10-CM | POA: Diagnosis not present

## 2020-10-01 DIAGNOSIS — M6281 Muscle weakness (generalized): Secondary | ICD-10-CM | POA: Diagnosis not present

## 2020-10-06 DIAGNOSIS — M2560 Stiffness of unspecified joint, not elsewhere classified: Secondary | ICD-10-CM | POA: Diagnosis not present

## 2020-10-06 DIAGNOSIS — M7541 Impingement syndrome of right shoulder: Secondary | ICD-10-CM | POA: Diagnosis not present

## 2020-10-06 DIAGNOSIS — M6281 Muscle weakness (generalized): Secondary | ICD-10-CM | POA: Diagnosis not present

## 2020-10-06 DIAGNOSIS — M7521 Bicipital tendinitis, right shoulder: Secondary | ICD-10-CM | POA: Diagnosis not present

## 2020-10-09 DIAGNOSIS — M6281 Muscle weakness (generalized): Secondary | ICD-10-CM | POA: Diagnosis not present

## 2020-10-09 DIAGNOSIS — M7521 Bicipital tendinitis, right shoulder: Secondary | ICD-10-CM | POA: Diagnosis not present

## 2020-10-09 DIAGNOSIS — M7541 Impingement syndrome of right shoulder: Secondary | ICD-10-CM | POA: Diagnosis not present

## 2020-10-09 DIAGNOSIS — M2560 Stiffness of unspecified joint, not elsewhere classified: Secondary | ICD-10-CM | POA: Diagnosis not present

## 2020-10-14 DIAGNOSIS — M2560 Stiffness of unspecified joint, not elsewhere classified: Secondary | ICD-10-CM | POA: Diagnosis not present

## 2020-10-14 DIAGNOSIS — M6281 Muscle weakness (generalized): Secondary | ICD-10-CM | POA: Diagnosis not present

## 2020-10-14 DIAGNOSIS — M7521 Bicipital tendinitis, right shoulder: Secondary | ICD-10-CM | POA: Diagnosis not present

## 2020-10-14 DIAGNOSIS — M7541 Impingement syndrome of right shoulder: Secondary | ICD-10-CM | POA: Diagnosis not present

## 2020-12-09 DIAGNOSIS — Z9889 Other specified postprocedural states: Secondary | ICD-10-CM | POA: Diagnosis not present

## 2020-12-09 DIAGNOSIS — Z85818 Personal history of malignant neoplasm of other sites of lip, oral cavity, and pharynx: Secondary | ICD-10-CM | POA: Diagnosis not present

## 2020-12-09 DIAGNOSIS — C051 Malignant neoplasm of soft palate: Secondary | ICD-10-CM | POA: Diagnosis not present

## 2020-12-11 DIAGNOSIS — H26493 Other secondary cataract, bilateral: Secondary | ICD-10-CM | POA: Diagnosis not present

## 2020-12-11 DIAGNOSIS — H5203 Hypermetropia, bilateral: Secondary | ICD-10-CM | POA: Diagnosis not present

## 2020-12-11 DIAGNOSIS — Z961 Presence of intraocular lens: Secondary | ICD-10-CM | POA: Diagnosis not present

## 2020-12-24 DIAGNOSIS — H26492 Other secondary cataract, left eye: Secondary | ICD-10-CM | POA: Diagnosis not present

## 2021-01-07 DIAGNOSIS — H26491 Other secondary cataract, right eye: Secondary | ICD-10-CM | POA: Diagnosis not present

## 2021-02-23 DIAGNOSIS — M810 Age-related osteoporosis without current pathological fracture: Secondary | ICD-10-CM | POA: Diagnosis not present

## 2021-02-23 DIAGNOSIS — F1020 Alcohol dependence, uncomplicated: Secondary | ICD-10-CM | POA: Diagnosis not present

## 2021-02-23 DIAGNOSIS — F331 Major depressive disorder, recurrent, moderate: Secondary | ICD-10-CM | POA: Diagnosis not present

## 2021-02-23 DIAGNOSIS — F419 Anxiety disorder, unspecified: Secondary | ICD-10-CM | POA: Diagnosis not present

## 2021-02-23 DIAGNOSIS — E039 Hypothyroidism, unspecified: Secondary | ICD-10-CM | POA: Diagnosis not present

## 2021-02-23 DIAGNOSIS — M75101 Unspecified rotator cuff tear or rupture of right shoulder, not specified as traumatic: Secondary | ICD-10-CM | POA: Diagnosis not present

## 2021-02-23 DIAGNOSIS — Z1159 Encounter for screening for other viral diseases: Secondary | ICD-10-CM | POA: Diagnosis not present

## 2021-02-23 DIAGNOSIS — I10 Essential (primary) hypertension: Secondary | ICD-10-CM | POA: Diagnosis not present

## 2021-02-23 DIAGNOSIS — E785 Hyperlipidemia, unspecified: Secondary | ICD-10-CM | POA: Diagnosis not present

## 2021-02-23 DIAGNOSIS — E0500 Thyrotoxicosis with diffuse goiter without thyrotoxic crisis or storm: Secondary | ICD-10-CM | POA: Diagnosis not present

## 2021-02-23 DIAGNOSIS — N1831 Chronic kidney disease, stage 3a: Secondary | ICD-10-CM | POA: Diagnosis not present

## 2021-02-23 DIAGNOSIS — Z0000 Encounter for general adult medical examination without abnormal findings: Secondary | ICD-10-CM | POA: Diagnosis not present

## 2021-03-01 ENCOUNTER — Other Ambulatory Visit: Payer: Self-pay | Admitting: Family Medicine

## 2021-03-01 DIAGNOSIS — I10 Essential (primary) hypertension: Secondary | ICD-10-CM | POA: Diagnosis not present

## 2021-03-01 DIAGNOSIS — C069 Malignant neoplasm of mouth, unspecified: Secondary | ICD-10-CM | POA: Diagnosis not present

## 2021-03-01 DIAGNOSIS — F419 Anxiety disorder, unspecified: Secondary | ICD-10-CM | POA: Diagnosis not present

## 2021-03-01 DIAGNOSIS — E039 Hypothyroidism, unspecified: Secondary | ICD-10-CM | POA: Diagnosis not present

## 2021-03-01 DIAGNOSIS — Z0000 Encounter for general adult medical examination without abnormal findings: Secondary | ICD-10-CM | POA: Diagnosis not present

## 2021-03-01 DIAGNOSIS — E559 Vitamin D deficiency, unspecified: Secondary | ICD-10-CM | POA: Diagnosis not present

## 2021-03-01 DIAGNOSIS — M810 Age-related osteoporosis without current pathological fracture: Secondary | ICD-10-CM | POA: Diagnosis not present

## 2021-03-01 DIAGNOSIS — E785 Hyperlipidemia, unspecified: Secondary | ICD-10-CM | POA: Diagnosis not present

## 2021-03-01 DIAGNOSIS — Z1231 Encounter for screening mammogram for malignant neoplasm of breast: Secondary | ICD-10-CM

## 2021-03-01 DIAGNOSIS — Z01419 Encounter for gynecological examination (general) (routine) without abnormal findings: Secondary | ICD-10-CM | POA: Diagnosis not present

## 2021-03-11 ENCOUNTER — Other Ambulatory Visit: Payer: Self-pay | Admitting: Family Medicine

## 2021-03-11 DIAGNOSIS — D485 Neoplasm of uncertain behavior of skin: Secondary | ICD-10-CM | POA: Diagnosis not present

## 2021-03-11 DIAGNOSIS — M8000XA Age-related osteoporosis with current pathological fracture, unspecified site, initial encounter for fracture: Secondary | ICD-10-CM

## 2021-03-16 ENCOUNTER — Other Ambulatory Visit: Payer: Self-pay

## 2021-03-16 ENCOUNTER — Ambulatory Visit
Admission: RE | Admit: 2021-03-16 | Discharge: 2021-03-16 | Disposition: A | Discharge status: Home / Self Care | Payer: PPO | Source: Ambulatory Visit | Attending: Family Medicine | Admitting: Family Medicine

## 2021-03-16 DIAGNOSIS — M810 Age-related osteoporosis without current pathological fracture: Secondary | ICD-10-CM | POA: Diagnosis not present

## 2021-03-16 DIAGNOSIS — M8000XA Age-related osteoporosis with current pathological fracture, unspecified site, initial encounter for fracture: Secondary | ICD-10-CM

## 2021-03-16 DIAGNOSIS — Z780 Asymptomatic menopausal state: Secondary | ICD-10-CM | POA: Diagnosis not present

## 2021-04-06 DIAGNOSIS — M810 Age-related osteoporosis without current pathological fracture: Secondary | ICD-10-CM | POA: Diagnosis not present

## 2021-04-08 DIAGNOSIS — M791 Myalgia, unspecified site: Secondary | ICD-10-CM | POA: Diagnosis not present

## 2021-04-08 DIAGNOSIS — N1832 Chronic kidney disease, stage 3b: Secondary | ICD-10-CM | POA: Diagnosis not present

## 2021-04-08 DIAGNOSIS — M79601 Pain in right arm: Secondary | ICD-10-CM | POA: Diagnosis not present

## 2021-04-08 DIAGNOSIS — R0602 Shortness of breath: Secondary | ICD-10-CM | POA: Diagnosis not present

## 2021-04-08 DIAGNOSIS — E0500 Thyrotoxicosis with diffuse goiter without thyrotoxic crisis or storm: Secondary | ICD-10-CM | POA: Diagnosis not present

## 2021-04-08 DIAGNOSIS — Z20822 Contact with and (suspected) exposure to covid-19: Secondary | ICD-10-CM | POA: Diagnosis not present

## 2021-04-08 DIAGNOSIS — R519 Headache, unspecified: Secondary | ICD-10-CM | POA: Diagnosis not present

## 2021-04-12 DIAGNOSIS — Z23 Encounter for immunization: Secondary | ICD-10-CM | POA: Diagnosis not present

## 2021-04-14 DIAGNOSIS — Z48814 Encounter for surgical aftercare following surgery on the teeth or oral cavity: Secondary | ICD-10-CM | POA: Diagnosis not present

## 2021-04-14 DIAGNOSIS — Z85818 Personal history of malignant neoplasm of other sites of lip, oral cavity, and pharynx: Secondary | ICD-10-CM | POA: Diagnosis not present

## 2021-04-14 DIAGNOSIS — C051 Malignant neoplasm of soft palate: Secondary | ICD-10-CM | POA: Diagnosis not present

## 2021-04-29 ENCOUNTER — Other Ambulatory Visit: Payer: Self-pay

## 2021-04-29 ENCOUNTER — Ambulatory Visit
Admission: RE | Admit: 2021-04-29 | Discharge: 2021-04-29 | Disposition: A | Discharge status: Home / Self Care | Payer: PPO | Source: Ambulatory Visit | Attending: Family Medicine | Admitting: Family Medicine

## 2021-04-29 DIAGNOSIS — Z1231 Encounter for screening mammogram for malignant neoplasm of breast: Secondary | ICD-10-CM | POA: Diagnosis not present

## 2021-05-06 ENCOUNTER — Other Ambulatory Visit: Payer: Self-pay | Admitting: Family Medicine

## 2021-05-06 DIAGNOSIS — R928 Other abnormal and inconclusive findings on diagnostic imaging of breast: Secondary | ICD-10-CM

## 2021-05-18 ENCOUNTER — Other Ambulatory Visit: Payer: Self-pay

## 2021-05-18 ENCOUNTER — Ambulatory Visit
Admission: RE | Admit: 2021-05-18 | Discharge: 2021-05-18 | Disposition: A | Discharge status: Home / Self Care | Payer: PPO | Source: Ambulatory Visit | Attending: Family Medicine | Admitting: Family Medicine

## 2021-05-18 ENCOUNTER — Ambulatory Visit: Payer: PPO

## 2021-05-18 DIAGNOSIS — R928 Other abnormal and inconclusive findings on diagnostic imaging of breast: Secondary | ICD-10-CM

## 2021-05-18 DIAGNOSIS — R922 Inconclusive mammogram: Secondary | ICD-10-CM | POA: Diagnosis not present

## 2021-07-12 DIAGNOSIS — M25562 Pain in left knee: Secondary | ICD-10-CM | POA: Diagnosis not present

## 2021-07-13 ENCOUNTER — Ambulatory Visit
Admission: RE | Admit: 2021-07-13 | Discharge: 2021-07-13 | Disposition: A | Discharge status: Home / Self Care | Payer: PPO | Source: Ambulatory Visit | Attending: Sports Medicine | Admitting: Sports Medicine

## 2021-07-13 ENCOUNTER — Other Ambulatory Visit: Payer: Self-pay | Admitting: Sports Medicine

## 2021-07-13 ENCOUNTER — Other Ambulatory Visit: Payer: Self-pay | Admitting: Student

## 2021-07-13 DIAGNOSIS — M25562 Pain in left knee: Secondary | ICD-10-CM

## 2021-08-20 DIAGNOSIS — M25562 Pain in left knee: Secondary | ICD-10-CM | POA: Diagnosis not present

## 2021-08-20 DIAGNOSIS — M1712 Unilateral primary osteoarthritis, left knee: Secondary | ICD-10-CM | POA: Diagnosis not present

## 2021-08-24 DIAGNOSIS — D485 Neoplasm of uncertain behavior of skin: Secondary | ICD-10-CM | POA: Diagnosis not present

## 2021-08-24 DIAGNOSIS — L409 Psoriasis, unspecified: Secondary | ICD-10-CM | POA: Diagnosis not present

## 2021-08-24 DIAGNOSIS — L821 Other seborrheic keratosis: Secondary | ICD-10-CM | POA: Diagnosis not present

## 2021-08-24 DIAGNOSIS — L578 Other skin changes due to chronic exposure to nonionizing radiation: Secondary | ICD-10-CM | POA: Diagnosis not present

## 2021-08-24 DIAGNOSIS — L814 Other melanin hyperpigmentation: Secondary | ICD-10-CM | POA: Diagnosis not present

## 2021-08-24 DIAGNOSIS — Z808 Family history of malignant neoplasm of other organs or systems: Secondary | ICD-10-CM | POA: Diagnosis not present

## 2021-08-24 DIAGNOSIS — Z23 Encounter for immunization: Secondary | ICD-10-CM | POA: Diagnosis not present

## 2021-08-24 DIAGNOSIS — D225 Melanocytic nevi of trunk: Secondary | ICD-10-CM | POA: Diagnosis not present

## 2021-09-01 DIAGNOSIS — Z85818 Personal history of malignant neoplasm of other sites of lip, oral cavity, and pharynx: Secondary | ICD-10-CM | POA: Diagnosis not present

## 2021-09-01 DIAGNOSIS — Z08 Encounter for follow-up examination after completed treatment for malignant neoplasm: Secondary | ICD-10-CM | POA: Diagnosis not present

## 2021-09-01 DIAGNOSIS — C051 Malignant neoplasm of soft palate: Secondary | ICD-10-CM | POA: Diagnosis not present

## 2021-10-12 DIAGNOSIS — M810 Age-related osteoporosis without current pathological fracture: Secondary | ICD-10-CM | POA: Diagnosis not present

## 2022-01-05 DIAGNOSIS — Z961 Presence of intraocular lens: Secondary | ICD-10-CM | POA: Diagnosis not present

## 2022-01-05 DIAGNOSIS — H401221 Low-tension glaucoma, left eye, mild stage: Secondary | ICD-10-CM | POA: Diagnosis not present

## 2022-01-05 DIAGNOSIS — H5203 Hypermetropia, bilateral: Secondary | ICD-10-CM | POA: Diagnosis not present

## 2022-01-12 DIAGNOSIS — Z08 Encounter for follow-up examination after completed treatment for malignant neoplasm: Secondary | ICD-10-CM | POA: Diagnosis not present

## 2022-01-12 DIAGNOSIS — C051 Malignant neoplasm of soft palate: Secondary | ICD-10-CM | POA: Diagnosis not present

## 2022-01-12 DIAGNOSIS — Z9889 Other specified postprocedural states: Secondary | ICD-10-CM | POA: Diagnosis not present

## 2022-01-12 DIAGNOSIS — Z85818 Personal history of malignant neoplasm of other sites of lip, oral cavity, and pharynx: Secondary | ICD-10-CM | POA: Diagnosis not present

## 2022-03-02 DIAGNOSIS — M810 Age-related osteoporosis without current pathological fracture: Secondary | ICD-10-CM | POA: Diagnosis not present

## 2022-03-02 DIAGNOSIS — E785 Hyperlipidemia, unspecified: Secondary | ICD-10-CM | POA: Diagnosis not present

## 2022-03-02 DIAGNOSIS — E039 Hypothyroidism, unspecified: Secondary | ICD-10-CM | POA: Diagnosis not present

## 2022-03-11 DIAGNOSIS — Z23 Encounter for immunization: Secondary | ICD-10-CM | POA: Diagnosis not present

## 2022-03-11 DIAGNOSIS — Z6822 Body mass index (BMI) 22.0-22.9, adult: Secondary | ICD-10-CM | POA: Diagnosis not present

## 2022-03-11 DIAGNOSIS — E039 Hypothyroidism, unspecified: Secondary | ICD-10-CM | POA: Diagnosis not present

## 2022-03-11 DIAGNOSIS — Z1389 Encounter for screening for other disorder: Secondary | ICD-10-CM | POA: Diagnosis not present

## 2022-03-11 DIAGNOSIS — Z0000 Encounter for general adult medical examination without abnormal findings: Secondary | ICD-10-CM | POA: Diagnosis not present

## 2022-03-11 DIAGNOSIS — M810 Age-related osteoporosis without current pathological fracture: Secondary | ICD-10-CM | POA: Diagnosis not present

## 2022-03-11 DIAGNOSIS — I10 Essential (primary) hypertension: Secondary | ICD-10-CM | POA: Diagnosis not present

## 2022-03-11 DIAGNOSIS — E785 Hyperlipidemia, unspecified: Secondary | ICD-10-CM | POA: Diagnosis not present

## 2022-04-11 ENCOUNTER — Other Ambulatory Visit: Payer: Self-pay | Admitting: Family Medicine

## 2022-04-11 DIAGNOSIS — Z1231 Encounter for screening mammogram for malignant neoplasm of breast: Secondary | ICD-10-CM

## 2022-04-20 DIAGNOSIS — E039 Hypothyroidism, unspecified: Secondary | ICD-10-CM | POA: Diagnosis not present

## 2022-04-26 DIAGNOSIS — M810 Age-related osteoporosis without current pathological fracture: Secondary | ICD-10-CM | POA: Diagnosis not present

## 2022-05-06 ENCOUNTER — Ambulatory Visit: Payer: PPO

## 2022-06-03 ENCOUNTER — Ambulatory Visit
Admission: RE | Admit: 2022-06-03 | Discharge: 2022-06-03 | Disposition: A | Discharge status: Home / Self Care | Payer: PPO | Source: Ambulatory Visit | Attending: Family Medicine | Admitting: Family Medicine

## 2022-06-03 DIAGNOSIS — Z1231 Encounter for screening mammogram for malignant neoplasm of breast: Secondary | ICD-10-CM | POA: Diagnosis not present

## 2022-06-21 DIAGNOSIS — E039 Hypothyroidism, unspecified: Secondary | ICD-10-CM | POA: Diagnosis not present

## 2022-07-04 DIAGNOSIS — H401411 Capsular glaucoma with pseudoexfoliation of lens, right eye, mild stage: Secondary | ICD-10-CM | POA: Diagnosis not present

## 2022-07-20 DIAGNOSIS — C051 Malignant neoplasm of soft palate: Secondary | ICD-10-CM | POA: Diagnosis not present

## 2022-08-29 DIAGNOSIS — D485 Neoplasm of uncertain behavior of skin: Secondary | ICD-10-CM | POA: Diagnosis not present

## 2022-08-29 DIAGNOSIS — L578 Other skin changes due to chronic exposure to nonionizing radiation: Secondary | ICD-10-CM | POA: Diagnosis not present

## 2022-08-29 DIAGNOSIS — D0471 Carcinoma in situ of skin of right lower limb, including hip: Secondary | ICD-10-CM | POA: Diagnosis not present

## 2022-08-29 DIAGNOSIS — Z808 Family history of malignant neoplasm of other organs or systems: Secondary | ICD-10-CM | POA: Diagnosis not present

## 2022-08-29 DIAGNOSIS — L821 Other seborrheic keratosis: Secondary | ICD-10-CM | POA: Diagnosis not present

## 2022-08-29 DIAGNOSIS — D225 Melanocytic nevi of trunk: Secondary | ICD-10-CM | POA: Diagnosis not present

## 2022-08-29 DIAGNOSIS — L814 Other melanin hyperpigmentation: Secondary | ICD-10-CM | POA: Diagnosis not present

## 2022-09-13 DIAGNOSIS — E039 Hypothyroidism, unspecified: Secondary | ICD-10-CM | POA: Diagnosis not present

## 2022-09-22 DIAGNOSIS — E039 Hypothyroidism, unspecified: Secondary | ICD-10-CM | POA: Diagnosis not present

## 2022-09-22 DIAGNOSIS — I10 Essential (primary) hypertension: Secondary | ICD-10-CM | POA: Diagnosis not present

## 2022-09-22 DIAGNOSIS — E559 Vitamin D deficiency, unspecified: Secondary | ICD-10-CM | POA: Diagnosis not present

## 2022-09-22 DIAGNOSIS — F331 Major depressive disorder, recurrent, moderate: Secondary | ICD-10-CM | POA: Diagnosis not present

## 2022-09-22 DIAGNOSIS — E785 Hyperlipidemia, unspecified: Secondary | ICD-10-CM | POA: Diagnosis not present

## 2022-09-22 DIAGNOSIS — F1020 Alcohol dependence, uncomplicated: Secondary | ICD-10-CM | POA: Diagnosis not present

## 2022-09-22 DIAGNOSIS — C44722 Squamous cell carcinoma of skin of right lower limb, including hip: Secondary | ICD-10-CM | POA: Diagnosis not present

## 2022-09-22 DIAGNOSIS — N1832 Chronic kidney disease, stage 3b: Secondary | ICD-10-CM | POA: Diagnosis not present

## 2022-09-22 DIAGNOSIS — Z6821 Body mass index (BMI) 21.0-21.9, adult: Secondary | ICD-10-CM | POA: Diagnosis not present

## 2022-09-22 DIAGNOSIS — M810 Age-related osteoporosis without current pathological fracture: Secondary | ICD-10-CM | POA: Diagnosis not present

## 2022-10-26 DIAGNOSIS — M810 Age-related osteoporosis without current pathological fracture: Secondary | ICD-10-CM | POA: Diagnosis not present

## 2022-11-07 DIAGNOSIS — E039 Hypothyroidism, unspecified: Secondary | ICD-10-CM | POA: Diagnosis not present

## 2022-11-07 DIAGNOSIS — H903 Sensorineural hearing loss, bilateral: Secondary | ICD-10-CM | POA: Diagnosis not present

## 2022-11-24 DIAGNOSIS — M549 Dorsalgia, unspecified: Secondary | ICD-10-CM | POA: Diagnosis not present

## 2022-11-24 DIAGNOSIS — M79601 Pain in right arm: Secondary | ICD-10-CM | POA: Diagnosis not present

## 2022-11-24 DIAGNOSIS — M62830 Muscle spasm of back: Secondary | ICD-10-CM | POA: Diagnosis not present

## 2022-11-28 DIAGNOSIS — I10 Essential (primary) hypertension: Secondary | ICD-10-CM | POA: Diagnosis not present

## 2022-11-28 DIAGNOSIS — F4522 Body dysmorphic disorder: Secondary | ICD-10-CM | POA: Diagnosis not present

## 2022-11-28 DIAGNOSIS — L820 Inflamed seborrheic keratosis: Secondary | ICD-10-CM | POA: Diagnosis not present

## 2022-11-28 DIAGNOSIS — D099 Carcinoma in situ, unspecified: Secondary | ICD-10-CM | POA: Diagnosis not present

## 2022-11-28 DIAGNOSIS — Z6822 Body mass index (BMI) 22.0-22.9, adult: Secondary | ICD-10-CM | POA: Diagnosis not present

## 2023-01-31 DIAGNOSIS — M542 Cervicalgia: Secondary | ICD-10-CM | POA: Diagnosis not present

## 2023-02-06 DIAGNOSIS — C051 Malignant neoplasm of soft palate: Secondary | ICD-10-CM | POA: Diagnosis not present

## 2023-02-07 NOTE — Therapy (Signed)
OUTPATIENT PHYSICAL THERAPY CERVICAL EVALUATION   Patient Name: Mary Graves MRN: [id number] DOB:1945/12/05, 77 y.o., female Today's Date: 02/07/2023  END OF SESSION:   Past Medical History:  Diagnosis Date   Alcoholism ([facility])    Hypertension    Hypothyroidism    Pancreatitis    Past Surgical History:  Procedure Laterality Date   KNEE SURGERY Left    x 2   NECK SURGERY     bilateral facet arthrosis   TONSILLECTOMY     Patient Active Problem List   Diagnosis Date Noted   Elevated troponin 07/11/2014   Syncope 07/11/2014   PTSD (post-traumatic stress disorder) 10/31/2013   Alcoholism in family 10/31/2013   Dementia ([facility]) 10/09/2013   HLD (hyperlipidemia) 02/27/2013   Osteopenia 02/27/2013   Alcohol dependence ([facility]) 06/01/2012   Major depression 06/01/2012   Hypothyroidism 02/23/2009   Essential hypertension 02/23/2009   HYPERGLYCEMIA 02/23/2009    PCP: Iva Boop, MD  REFERRING PROVIDER: Venita Lick, MD  REFERRING DIAG: cervicalgia  THERAPY DIAG:  No diagnosis found.  Rationale for Evaluation and Treatment: Rehabilitation  ONSET DATE: ***  SUBJECTIVE:                                                                                                                                                                                                         SUBJECTIVE STATEMENT: *** Hand dominance: {MISC; OT HAND DOMINANCE:[phone]}  PERTINENT HISTORY:  Hypothyroidism, HTN, depression, PTSD  PAIN:  Are you having pain? Yes: NPRS scale: ***/10 Pain location: *** Pain description: *** Aggravating factors: *** Relieving factors: ***  PRECAUTIONS: {Therapy precautions:24002}  WEIGHT BEARING RESTRICTIONS: {Yes ***/No:24003}  FALLS:  Has patient fallen in last 6 months? {fallsyesno:27318}  LIVING ENVIRONMENT: Lives with: {[facility] lives with:25569::"lives with their family"} Lives in: {Lives in:25570} Stairs: {opstairs:27293} Has following equipment at  home: {Assistive devices:23999}  OCCUPATION: ***  PLOF: {PLOF:24004}  PATIENT GOALS: ***  NEXT MD VISIT: ***  OBJECTIVE:   DIAGNOSTIC FINDINGS:  ***  PATIENT SURVEYS:  FOTO ***  COGNITION: Overall cognitive status: Within functional limits for tasks assessed  SENSATION: WFL  POSTURE: {posture:25561}  PALPATION: ***   CERVICAL ROM:   {AROM/PROM:27142} ROM A/PROM (deg) eval  Flexion   Extension   Right lateral flexion   Left lateral flexion   Right rotation   Left rotation    (Blank rows = not tested)  UPPER EXTREMITY ROM:  {AROM/PROM:27142} ROM Right eval Left eval  Shoulder flexion    Shoulder extension    Shoulder abduction    Shoulder adduction  Shoulder extension    Shoulder internal rotation    Shoulder external rotation    Elbow flexion    Elbow extension    Wrist flexion    Wrist extension    Wrist ulnar deviation    Wrist radial deviation    Wrist pronation    Wrist supination     (Blank rows = not tested)  UPPER EXTREMITY MMT:  MMT Right eval Left eval  Shoulder flexion    Shoulder extension    Shoulder abduction    Shoulder adduction    Shoulder extension    Shoulder internal rotation    Shoulder external rotation    Middle trapezius    Lower trapezius    Elbow flexion    Elbow extension    Wrist flexion    Wrist extension    Wrist ulnar deviation    Wrist radial deviation    Wrist pronation    Wrist supination    Grip strength     (Blank rows = not tested)  CERVICAL SPECIAL TESTS:  {Cervical special tests:25246}  FUNCTIONAL TESTS:  {Functional tests:24029}  TODAY'S TREATMENT:                                                                                                                              DATE: 02/08/23   PATIENT EDUCATION:  Education details: *** Person educated: Patient Education method: Programmer, multimedia, Facilities manager, and Handouts Education comprehension: verbalized understanding and returned  demonstration  HOME EXERCISE PROGRAM: ***  ASSESSMENT:  CLINICAL IMPRESSION: Patient is a 77 y.o. female who was seen today for physical therapy evaluation and treatment for neck pain.   OBJECTIVE IMPAIRMENTS: decreased activity tolerance, decreased ROM, decreased strength, hypomobility, increased muscle spasms, impaired flexibility, postural dysfunction, and pain.   ACTIVITY LIMITATIONS: {activitylimitations:27494}  PARTICIPATION LIMITATIONS: {participationrestrictions:25113}  PERSONAL FACTORS: {Personal factors:25162} are also affecting patient's functional outcome.   REHAB POTENTIAL: Good  CLINICAL DECISION MAKING: Stable/uncomplicated  EVALUATION COMPLEXITY: {Evaluation complexity:25115}   GOALS: Goals reviewed with patient? Yes  SHORT TERM GOALS: Target date: ***  Be independent in initial HEP Baseline:  Goal status: INITIAL  2.  *** Baseline:  Goal status: INITIAL  3.  *** Baseline:  Goal status: INITIAL  4.  *** Baseline:  Goal status: INITIAL  5.  *** Baseline:  Goal status: INITIAL  6.  *** Baseline:  Goal status: INITIAL  LONG TERM GOALS: Target date: ***  Be independent in advanced HEP Baseline:  Goal status: INITIAL  2.  *** Baseline:  Goal status: INITIAL  3.  *** Baseline:  Goal status: INITIAL  4.  *** Baseline:  Goal status: INITIAL  5.  *** Baseline:  Goal status: INITIAL  6.  *** Baseline:  Goal status: INITIAL   PLAN:  PT FREQUENCY: 2x/week  PT DURATION: 8 weeks  PLANNED INTERVENTIONS: Therapeutic exercises, Therapeutic activity, Neuromuscular re-education, Balance training, Gait training, Patient/Family education, Self Care, Joint mobilization, Vestibular training, Aquatic Therapy, Dry  Needling, Spinal mobilization, Cryotherapy, Moist heat, Taping, Traction, Ultrasound, Ionotophoresis 4mg /ml Dexamethasone, Manual therapy, and Re-evaluation  PLAN FOR NEXT SESSION: ***  Lorrene Reid, PT 02/07/23 12:56 PM

## 2023-02-08 ENCOUNTER — Ambulatory Visit: Payer: PPO | Attending: Orthopedic Surgery

## 2023-02-08 ENCOUNTER — Other Ambulatory Visit: Payer: Self-pay

## 2023-02-08 DIAGNOSIS — R252 Cramp and spasm: Secondary | ICD-10-CM | POA: Diagnosis not present

## 2023-02-08 DIAGNOSIS — R293 Abnormal posture: Secondary | ICD-10-CM | POA: Insufficient documentation

## 2023-02-08 DIAGNOSIS — M542 Cervicalgia: Secondary | ICD-10-CM | POA: Insufficient documentation

## 2023-02-08 DIAGNOSIS — M6281 Muscle weakness (generalized): Secondary | ICD-10-CM | POA: Diagnosis not present

## 2023-02-13 NOTE — Therapy (Signed)
OUTPATIENT PHYSICAL THERAPY CERVICAL TREATMENT   Patient Name: Mary Graves MRN: [id number] DOB:[birth year], 77 y.o., female Today's Date: 02/14/2023  END OF SESSION:  PT End of Session - 02/14/23 1529     Visit Number 2    Date for PT Re-Evaluation 04/05/23    Authorization Type [employer]    Progress Note Due on Visit 10    PT Start Time 1530    PT Stop Time 1612    PT Time Calculation (min) 42 min    Activity Tolerance Patient tolerated treatment well              Past Medical History:  Diagnosis Date   Alcoholism (HCC)    Hypertension    Hypothyroidism    Pancreatitis    Past Surgical History:  Procedure Laterality Date   KNEE SURGERY Left    x 2   NECK SURGERY     bilateral facet arthrosis   TONSILLECTOMY     Patient Active Problem List   Diagnosis Date Noted   Elevated troponin 07/11/2014   Syncope 07/11/2014   PTSD (post-traumatic stress disorder) 10/31/2013   Alcoholism in family 10/31/2013   Dementia (HCC) 10/09/2013   HLD (hyperlipidemia) 02/27/2013   Osteopenia 02/27/2013   Alcohol dependence (HCC) 06/01/2012   Major depression 06/01/2012   Hypothyroidism 02/23/2009   Essential hypertension 02/23/2009   HYPERGLYCEMIA 02/23/2009    PCP: Iva Boop, MD  REFERRING PROVIDER: Venita Lick, MD  REFERRING DIAG: cervicalgia  THERAPY DIAG:  Cervicalgia  Abnormal posture  Cramp and spasm  Muscle weakness (generalized)  Rationale for Evaluation and Treatment: Rehabilitation  ONSET DATE: chronic with flare-up 01/11/23  SUBJECTIVE:                                                                                                                                                                                                         SUBJECTIVE STATEMENT: Patient reports no pain since starting Gabapentin Hand dominance: Right  PERTINENT HISTORY:  Hypothyroidism, HTN, depression, PTSD  PAIN:  Are you having pain? Yes:  NPRS scale: 0-10/10 Pain location: Lt arm and scapula Pain description: pinching, aching, stabbing  Aggravating factors: moving around  Relieving factors: Gabapentin, Tylenol, rest  PRECAUTIONS: None  WEIGHT BEARING RESTRICTIONS: No  FALLS:  Has patient fallen in last 6 months? No  LIVING ENVIRONMENT: Lives with: lives alone   OCCUPATION: retired   PLOF: Independent, walks for exercise  PATIENT GOALS: stop taking medication, reduce pain in Lt arm, get muscles loosened up   OBJECTIVE:   DIAGNOSTIC FINDINGS:  None recent  PATIENT SURVEYS:  FOTO 57, goal is 47  COGNITION: Overall cognitive status: Within functional limits for tasks assessed  SENSATION: WFL  POSTURE: rounded shoulders and forward head  PALPATION: Reduced and painful cervical segmental mobility, pain with side glides bilaterally.  Tension and trigger points in Rt>Lt upper traps and bil cervical paraspinals.  Atrophy in bil rhomboids   CERVICAL ROM:   Active ROM A/PROM (deg) eval  Flexion full  Extension full  Right lateral flexion 40  Left lateral flexion 30  Right rotation 50  Left rotation 50   (Blank rows = not tested)  UPPER EXTREMITY ROM: WFLs in bil Ues without pain  UPPER EXTREMITY MMT: 4 to 4+/5 bil UE strength   TODAY'S TREATMENT:                                                                                                                              DATE:  02/14/23 UBE L3 fwd x 5 min reviewing status Neck SB (levator stretch) 2x20 sec,rotation 2x20 sec B, flex reviewed Doorway pec stretch 2x 30 sec Seated neck retraction x 10 Shoulder ER band standing with cues for scapular retraction x 10 Standing horizontal ABD RTB with bolster along spine x 10 Attmepted diagonals with left UE but pt felt some discomfort in scapula and wanted to hold  Trigger Point Dry-Needling  Treatment instructions: Expect mild to moderate muscle soreness. S/S of pneumothorax if dry needled over a  lung field, and to seek immediate medical attention should they occur. Patient verbalized understanding of these instructions and education. Patient Consent Given: Yes Education handout provided: Previously provided Muscles treated: IS, UT Electrical stimulation performed: No Parameters: N/A Treatment response/outcome: Twitch Response Elicited and Palpable Increase in Muscle Length  02/08/23  HEP established- see below   PATIENT EDUCATION:  Education details: Access Code: [email or web address] Person educated: Patient Education method: Explanation, Demonstration, and Handouts Education comprehension: verbalized understanding and returned demonstration  HOME EXERCISE PROGRAM: Access Code: [email or web address] URL: [email or web address] Date: 02/08/2023 Prepared by: Tresa Endo  Exercises - Seated Cervical Flexion AROM  - 3 x daily - 7 x weekly - 1 sets - 3 reps - 20 hold - Seated Cervical Sidebending AROM  - 3 x daily - 7 x weekly - 1 sets - 3 reps - 20 hold - Seated Cervical Rotation AROM  - 3 x daily - 7 x weekly - 1 sets - 3 reps - 20 hold - Seated Correct Posture  - 1 x daily - 7 x weekly - 3 sets - 10 reps - Doorway Pec Stretch at 90 Degrees Abduction  - 3 x daily - 7 x weekly - 1 sets - 20 reps - Shoulder External Rotation with Resistance  - 2 x daily - 7 x weekly - 2 sets - 10 reps  ASSESSMENT:  CLINICAL IMPRESSION: Danaka reports she has had no pain since starting Gabapentin. She demonstrates ongoing neck flexion which she corrects with  cueing. She had pain in the scapula with shoulder diagonals and demonstrates trigger points in L IS. Iniital trial of DN here with excellent twitch responses. She continues to demonstrate potential for improvement and would benefit from continued skilled therapy to address impairments.    OBJECTIVE IMPAIRMENTS: decreased activity tolerance, decreased ROM, decreased strength, hypomobility, increased muscle spasms, impaired flexibility, postural dysfunction,  and pain.   ACTIVITY LIMITATIONS: carrying, lifting, and locomotion level  PARTICIPATION LIMITATIONS: cleaning, driving, shopping, and community activity  PERSONAL FACTORS: Age and 1-2 comorbidities: chronic neck pain, C5-7 HNP  are also affecting patient's functional outcome.   REHAB POTENTIAL: Good  CLINICAL DECISION MAKING: Stable/uncomplicated  EVALUATION COMPLEXITY: Low   GOALS: Goals reviewed with patient? Yes  SHORT TERM GOALS: Target date: 03/08/2023    Be independent in initial HEP Baseline:  Goal status: INITIAL  2.  Report > or = to 30% reduction in neck and Lt UE pain with daily activity  Baseline:  Goal status: INITIAL  3.  Report and demonstrate postural corrections for alignment and reduced upper trap activation Baseline:  Goal status: INITIAL    LONG TERM GOALS: Target date: 04/05/2023    Be independent in advanced HEP Baseline:  Goal status: INITIAL  2.  Improve FOTO to > or = to 66 Baseline: 57 Goal status: INITIAL  3.  Report > or = to 70% reduction in neck and Lt UE pain with daily tasks  Baseline: 0-10/10 Goal status: INITIAL  4.  Return to regular walking program and verbalize safe progression Baseline:  Goal status: INITIAL   PLAN:  PT FREQUENCY: 2x/week  PT DURATION: 8 weeks  PLANNED INTERVENTIONS: Therapeutic exercises, Therapeutic activity, Neuromuscular re-education, Balance training, Gait training, Patient/Family education, Self Care, Joint mobilization, Vestibular training, Aquatic Therapy, Dry Needling, Spinal mobilization, Cryotherapy, Moist heat, Taping, Traction, Ultrasound, Ionotophoresis 4mg /ml Dexamethasone, Manual therapy, and Re-evaluation  PLAN FOR NEXT SESSION: assess DN to neck and Lt scapula, postural strength, cervical retraction, snags, review HEP  Solon Palm, PT 02/14/23 4:15 PM   [facility] [address], [address] [place], [place] [address] Phone # [phone] Fax  [phone]

## 2023-02-14 ENCOUNTER — Ambulatory Visit: Payer: PPO | Admitting: Physical Therapy

## 2023-02-14 ENCOUNTER — Encounter: Payer: Self-pay | Admitting: Physical Therapy

## 2023-02-14 DIAGNOSIS — M542 Cervicalgia: Secondary | ICD-10-CM

## 2023-02-14 DIAGNOSIS — R252 Cramp and spasm: Secondary | ICD-10-CM

## 2023-02-14 DIAGNOSIS — M6281 Muscle weakness (generalized): Secondary | ICD-10-CM

## 2023-02-14 DIAGNOSIS — R293 Abnormal posture: Secondary | ICD-10-CM

## 2023-02-15 ENCOUNTER — Ambulatory Visit: Payer: PPO

## 2023-02-16 NOTE — Therapy (Signed)
OUTPATIENT PHYSICAL THERAPY CERVICAL TREATMENT   Patient Name: Mary Graves MRN: [id number] DOB:17-Jan-1946, 77 y.o., female Today's Date: 02/17/2023  END OF SESSION:  PT End of Session - 02/17/23 0933     Visit Number 3    Date for PT Re-Evaluation 04/05/23    Authorization Type [employer]    PT Start Time 0932    PT Stop Time 1015    PT Time Calculation (min) 43 min    Activity Tolerance Patient tolerated treatment well    Behavior During Therapy [facility] for tasks assessed/performed               Past Medical History:  Diagnosis Date   Alcoholism (HCC)    Hypertension    Hypothyroidism    Pancreatitis    Past Surgical History:  Procedure Laterality Date   KNEE SURGERY Left    x 2   NECK SURGERY     bilateral facet arthrosis   TONSILLECTOMY     Patient Active Problem List   Diagnosis Date Noted   Elevated troponin 07/11/2014   Syncope 07/11/2014   PTSD (post-traumatic stress disorder) 10/31/2013   Alcoholism in family 10/31/2013   Dementia (HCC) 10/09/2013   HLD (hyperlipidemia) 02/27/2013   Osteopenia 02/27/2013   Alcohol dependence (HCC) 06/01/2012   Major depression 06/01/2012   Hypothyroidism 02/23/2009   Essential hypertension 02/23/2009   HYPERGLYCEMIA 02/23/2009    PCP: Iva Boop, MD  REFERRING PROVIDER: Venita Lick, MD  REFERRING DIAG: cervicalgia  THERAPY DIAG:  Cervicalgia  Abnormal posture  Cramp and spasm  Muscle weakness (generalized)  Rationale for Evaluation and Treatment: Rehabilitation  ONSET DATE: chronic with flare-up 01/11/23  SUBJECTIVE:                                                                                                                                                                                                         SUBJECTIVE STATEMENT: Only just some discomfort in neck now. No pain. Able to walk 45 min and 30 min with no pain. Hand dominance: Right  PERTINENT HISTORY:   Hypothyroidism, HTN, depression, PTSD  PAIN:  Are you having pain? Yes: NPRS scale: 0-10/10 Pain location: Lt arm and scapula Pain description: pinching, aching, stabbing  Aggravating factors: moving around  Relieving factors: Gabapentin, Tylenol, rest  PRECAUTIONS: None  WEIGHT BEARING RESTRICTIONS: No  FALLS:  Has patient fallen in last 6 months? No  LIVING ENVIRONMENT: Lives with: lives alone   OCCUPATION: retired   PLOF: Independent, walks for exercise  PATIENT GOALS: stop taking medication, reduce  pain in Lt arm, get muscles loosened up   OBJECTIVE:   DIAGNOSTIC FINDINGS:  None recent  PATIENT SURVEYS:  FOTO 57, goal is 75  COGNITION: Overall cognitive status: Within functional limits for tasks assessed  SENSATION: WFL  POSTURE: rounded shoulders and forward head  PALPATION: Reduced and painful cervical segmental mobility, pain with side glides bilaterally.  Tension and trigger points in Rt>Lt upper traps and bil cervical paraspinals.  Atrophy in bil rhomboids   CERVICAL ROM:   Active ROM A/PROM (deg) eval  Flexion full  Extension full  Right lateral flexion 40  Left lateral flexion 30  Right rotation 50  Left rotation 50   (Blank rows = not tested)  UPPER EXTREMITY ROM: WFLs in bil Ues without pain  UPPER EXTREMITY MMT: 4 to 4+/5 bil UE strength   TODAY'S TREATMENT:                                                                                                                              DATE:  02/17/23 UBE L3 fwd/bwdx 6  min reviewing status Doorway pec stretch 3x 30 sec Rows GTB x 20 Extension pull downs GTB x 20 Prone T and Y x 10 B Prone on elbows neck diagonals elbow to opp shoulder x 10 e Supine neck retraction x 10 Supine YTB horizontal ABD 2x 10, diagonals 2x 10 ea Supine OH flexion YTB x 20  02/14/23 UBE L3 fwd x 5 min reviewing status Neck SB (levator stretch) 2x20 sec,rotation 2x20 sec B, flex reviewed Doorway pec  stretch 2x 30 sec Seated neck retraction x 10 Shoulder ER band standing with cues for scapular retraction x 10 Standing horizontal ABD RTB with bolster along spine x 10 Attmepted diagonals with left UE but pt felt some discomfort in scapula and wanted to hold  Trigger Point Dry-Needling  Treatment instructions: Expect mild to moderate muscle soreness. S/S of pneumothorax if dry needled over a lung field, and to seek immediate medical attention should they occur. Patient verbalized understanding of these instructions and education. Patient Consent Given: Yes Education handout provided: Previously provided Muscles treated: IS, UT Electrical stimulation performed: No Parameters: N/A Treatment response/outcome: Twitch Response Elicited and Palpable Increase in Muscle Length  02/08/23  HEP established- see below   PATIENT EDUCATION:  Education details: Access Code: [email or web address] Person educated: Patient Education method: Explanation, Demonstration, and Handouts Education comprehension: verbalized understanding and returned demonstration  HOME EXERCISE PROGRAM: Access Code: [email or web address] URL: [email or web address] Date: 02/17/2023 Prepared by: Raynelle Fanning  Exercises - Seated Cervical Flexion AROM  - 3 x daily - 7 x weekly - 1 sets - 3 reps - 20 hold - Seated Cervical Sidebending AROM  - 3 x daily - 7 x weekly - 1 sets - 3 reps - 20 hold - Seated Cervical Rotation AROM  - 3 x daily - 7 x weekly - 1 sets - 3 reps - 20 hold - Seated Correct Posture  -  1 x daily - 7 x weekly - 3 sets - 10 reps - Doorway Pec Stretch at 90 Degrees Abduction  - 3 x daily - 7 x weekly - 1 sets - 20 reps - Shoulder External Rotation with Resistance  - 2 x daily - 7 x weekly - 2 sets - 10 reps - Standing Shoulder Row with Anchored Resistance  - 1 x daily - 4 x weekly - 1-3 sets - 10 reps - Standing Shoulder Extension with Resistance  - 1 x daily - 4 x weekly - 1 sets - 10 reps - Cervical Rotation Prone on Elbows   - 2-3 x daily - 7 x weekly - 1- sets - 10 reps  ASSESSMENT:  CLINICAL IMPRESSION: [name] reports continued improvements being able to walk without increased neck or shoulder sx.Today she feels mainly in the right cervical spine and she may benefit to DN here next visit. HEP progressed with postural strengthening.    OBJECTIVE IMPAIRMENTS: decreased activity tolerance, decreased ROM, decreased strength, hypomobility, increased muscle spasms, impaired flexibility, postural dysfunction, and pain.   ACTIVITY LIMITATIONS: carrying, lifting, and locomotion level  PARTICIPATION LIMITATIONS: cleaning, driving, shopping, and community activity  PERSONAL FACTORS: Age and 1-2 comorbidities: chronic neck pain, C5-7 HNP  are also affecting patient's functional outcome.   REHAB POTENTIAL: Good  CLINICAL DECISION MAKING: Stable/uncomplicated  EVALUATION COMPLEXITY: Low   GOALS: Goals reviewed with patient? Yes  SHORT TERM GOALS: Target date: 03/08/2023    Be independent in initial HEP Baseline:  Goal status: INITIAL  2.  Report > or = to 30% reduction in neck and Lt UE pain with daily activity  Baseline:  Goal status: INITIAL  3.  Report and demonstrate postural corrections for alignment and reduced upper trap activation Baseline:  Goal status: INITIAL    LONG TERM GOALS: Target date: 04/05/2023    Be independent in advanced HEP Baseline:  Goal status: INITIAL  2.  Improve FOTO to > or = to 66 Baseline: 57 Goal status: INITIAL  3.  Report > or = to 70% reduction in neck and Lt UE pain with daily tasks  Baseline: 0-10/10 Goal status: INITIAL  4.  Return to regular walking program and verbalize safe progression Baseline:  Goal status: INITIAL   PLAN:  PT FREQUENCY: 2x/week  PT DURATION: 8 weeks  PLANNED INTERVENTIONS: Therapeutic exercises, Therapeutic activity, Neuromuscular re-education, Balance training, Gait training, Patient/Family education, Self Care, Joint  mobilization, Vestibular training, Aquatic Therapy, Dry Needling, Spinal mobilization, Cryotherapy, Moist heat, Taping, Traction, Ultrasound, Ionotophoresis 4mg /ml Dexamethasone, Manual therapy, and Re-evaluation  PLAN FOR NEXT SESSION: continue DN to neck and Lt scapula, postural strength, cervical retraction, snags, review HEP  Solon Palm, PT 02/17/23 10:18 AM   [facility] [address] [place], [place] [address] Phone # [phone] Fax [phone]

## 2023-02-17 ENCOUNTER — Ambulatory Visit: Payer: PPO | Admitting: Physical Therapy

## 2023-02-17 ENCOUNTER — Encounter: Payer: Self-pay | Admitting: Physical Therapy

## 2023-02-17 DIAGNOSIS — M542 Cervicalgia: Secondary | ICD-10-CM | POA: Diagnosis not present

## 2023-02-17 DIAGNOSIS — M6281 Muscle weakness (generalized): Secondary | ICD-10-CM

## 2023-02-17 DIAGNOSIS — R252 Cramp and spasm: Secondary | ICD-10-CM

## 2023-02-17 DIAGNOSIS — R293 Abnormal posture: Secondary | ICD-10-CM

## 2023-02-21 ENCOUNTER — Ambulatory Visit: Payer: PPO

## 2023-02-21 DIAGNOSIS — M6281 Muscle weakness (generalized): Secondary | ICD-10-CM

## 2023-02-21 DIAGNOSIS — M542 Cervicalgia: Secondary | ICD-10-CM | POA: Diagnosis not present

## 2023-02-21 DIAGNOSIS — R293 Abnormal posture: Secondary | ICD-10-CM

## 2023-02-21 NOTE — Therapy (Signed)
OUTPATIENT PHYSICAL THERAPY CERVICAL TREATMENT   Patient Name: Mary Graves MRN: [id number] DOB:04/27/1946, 77 y.o., female Today's Date: 02/21/2023  END OF SESSION:  PT End of Session - 02/21/23 0930     Visit Number 4    Date for PT Re-Evaluation 04/05/23    Authorization Type [employer]    Progress Note Due on Visit 10    PT Start Time 0847    PT Stop Time 0930    PT Time Calculation (min) 43 min    Activity Tolerance Patient tolerated treatment well    Behavior During Therapy [facility] for tasks assessed/performed                Past Medical History:  Diagnosis Date   Alcoholism (HCC)    Hypertension    Hypothyroidism    Pancreatitis    Past Surgical History:  Procedure Laterality Date   KNEE SURGERY Left    x 2   NECK SURGERY     bilateral facet arthrosis   TONSILLECTOMY     Patient Active Problem List   Diagnosis Date Noted   Elevated troponin 07/11/2014   Syncope 07/11/2014   PTSD (post-traumatic stress disorder) 10/31/2013   Alcoholism in family 10/31/2013   Dementia (HCC) 10/09/2013   HLD (hyperlipidemia) 02/27/2013   Osteopenia 02/27/2013   Alcohol dependence (HCC) 06/01/2012   Major depression 06/01/2012   Hypothyroidism 02/23/2009   Essential hypertension 02/23/2009   HYPERGLYCEMIA 02/23/2009    PCP: Iva Boop, MD  REFERRING PROVIDER: Venita Lick, MD  REFERRING DIAG: cervicalgia  THERAPY DIAG:  Cervicalgia  Abnormal posture  Muscle weakness (generalized)  Rationale for Evaluation and Treatment: Rehabilitation  ONSET DATE: chronic with flare-up 01/11/23  SUBJECTIVE:                                                                                                                                                                                                         SUBJECTIVE STATEMENT: 25% overall improvement since the start of care.  I feel stiff but I don't have much pain.   Hand dominance: Right  PERTINENT  HISTORY:  Hypothyroidism, HTN, depression, PTSD  PAIN:  Are you having pain? Yes: NPRS scale: 0-3/10 Pain location: Lt arm and scapula Pain description: pinching, aching, stabbing  Aggravating factors: moving around  Relieving factors: Gabapentin, Tylenol, rest  PRECAUTIONS: None  WEIGHT BEARING RESTRICTIONS: No  FALLS:  Has patient fallen in last 6 months? No  LIVING ENVIRONMENT: Lives with: lives alone   OCCUPATION: retired   PLOF: Independent, walks for exercise  PATIENT GOALS: stop taking medication, reduce pain in Lt arm, get muscles loosened up   OBJECTIVE:   DIAGNOSTIC FINDINGS:  None recent  PATIENT SURVEYS:  FOTO 57, goal is 74  COGNITION: Overall cognitive status: Within functional limits for tasks assessed  SENSATION: WFL  POSTURE: rounded shoulders and forward head  PALPATION: Reduced and painful cervical segmental mobility, pain with side glides bilaterally.  Tension and trigger points in Rt>Lt upper traps and bil cervical paraspinals.  Atrophy in bil rhomboids   CERVICAL ROM:   Active ROM A/PROM (deg) eval  Flexion full  Extension full  Right lateral flexion 40  Left lateral flexion 30  Right rotation 50  Left rotation 50   (Blank rows = not tested)  UPPER EXTREMITY ROM: WFLs in bil Ues without pain  UPPER EXTREMITY MMT: 4 to 4+/5 bil UE strength   TODAY'S TREATMENT:                                                                                                                              DATE:  02/21/23 UBE L3 fwd/bwdx 6  min reviewing status Doorway pec stretch 3x 30 sec Rows GTB x 20 Prone on elbows neck diagonals elbow to opp shoulder x 10 e Supine YTB horizontal ABD 2x 10, diagonals 2x 10 ea Supine OH flexion YTB x 20 Trigger Point Dry-Needling  Treatment instructions: Expect mild to moderate muscle soreness. S/S of pneumothorax if dry needled over a lung field, and to seek immediate medical attention should they occur.  Patient verbalized understanding of these instructions and education. Patient Consent Given: Yes Education handout provided: Previously provided Muscles treated: bil cervical multifidi, bil upper traps, suboccipitals Electrical stimulation performed: No Elongation and release after DN Treatment response/outcome: Twitch Response Elicited and Palpable Increase in Muscle Length 02/17/23 UBE L3 fwd/bwdx 6  min reviewing status Doorway pec stretch 3x 30 sec Rows GTB x 20 Extension pull downs GTB x 20 Prone T and Y x 10 B Prone on elbows neck diagonals elbow to opp shoulder x 10 e Supine neck retraction x 10 Supine YTB horizontal ABD 2x 10, diagonals 2x 10 ea Supine OH flexion YTB x 20  02/14/23 UBE L3 fwd x 5 min reviewing status Neck SB (levator stretch) 2x20 sec,rotation 2x20 sec B, flex reviewed Doorway pec stretch 2x 30 sec Seated neck retraction x 10 Shoulder ER band standing with cues for scapular retraction x 10 Standing horizontal ABD RTB with bolster along spine x 10 Attmepted diagonals with left UE but pt felt some discomfort in scapula and wanted to hold  Trigger Point Dry-Needling  Treatment instructions: Expect mild to moderate muscle soreness. S/S of pneumothorax if dry needled over a lung field, and to seek immediate medical attention should they occur. Patient verbalized understanding of these instructions and education. Patient Consent Given: Yes Education handout provided: Previously provided Muscles treated: IS, UT Electrical stimulation performed: No Parameters: N/A Treatment response/outcome: Twitch Response  Elicited and Palpable Increase in Muscle Length  PATIENT EDUCATION:  Education details: Access Code: [email or web address] Person educated: Patient Education method: Explanation, Demonstration, and Handouts Education comprehension: verbalized understanding and returned demonstration  HOME EXERCISE PROGRAM: Access Code: [email or web address] URL:  [email or web address] Date: 02/17/2023 Prepared by: Raynelle Fanning  Exercises - Seated Cervical Flexion AROM  - 3 x daily - 7 x weekly - 1 sets - 3 reps - 20 hold - Seated Cervical Sidebending AROM  - 3 x daily - 7 x weekly - 1 sets - 3 reps - 20 hold - Seated Cervical Rotation AROM  - 3 x daily - 7 x weekly - 1 sets - 3 reps - 20 hold - Seated Correct Posture  - 1 x daily - 7 x weekly - 3 sets - 10 reps - Doorway Pec Stretch at 90 Degrees Abduction  - 3 x daily - 7 x weekly - 1 sets - 20 reps - Shoulder External Rotation with Resistance  - 2 x daily - 7 x weekly - 2 sets - 10 reps - Standing Shoulder Row with Anchored Resistance  - 1 x daily - 4 x weekly - 1-3 sets - 10 reps - Standing Shoulder Extension with Resistance  - 1 x daily - 4 x weekly - 1 sets - 10 reps - Cervical Rotation Prone on Elbows  - 2-3 x daily - 7 x weekly - 1- sets - 10 reps  ASSESSMENT:  CLINICAL IMPRESSION: Pt presents with bil neck stiffness and overall reduced pain. Pt reports 25% overall reduction in pain since the start of care.  Pt is working on postural corrections at home and demonstrates improved form with standing scapular strength exercises.  Pt with main complaint of stiffness in her neck and pt had good response to DN with multiple twitch response and improved tissue mobility after treatment today.  Patient will benefit from skilled PT to address the below impairments and improve overall function.    OBJECTIVE IMPAIRMENTS: decreased activity tolerance, decreased ROM, decreased strength, hypomobility, increased muscle spasms, impaired flexibility, postural dysfunction, and pain.   ACTIVITY LIMITATIONS: carrying, lifting, and locomotion level  PARTICIPATION LIMITATIONS: cleaning, driving, shopping, and community activity  PERSONAL FACTORS: Age and 1-2 comorbidities: chronic neck pain, C5-7 HNP  are also affecting patient's functional outcome.   REHAB POTENTIAL: Good  CLINICAL DECISION MAKING:  Stable/uncomplicated  EVALUATION COMPLEXITY: Low   GOALS: Goals reviewed with patient? Yes  SHORT TERM GOALS: Target date: 03/08/2023    Be independent in initial HEP Baseline: 02/21/23 Goal status: MET  2.  Report > or = to 30% reduction in neck and Lt UE pain with daily activity  Baseline: 25% reduction (02/21/23) Goal status: In progress   3.  Report and demonstrate postural corrections for alignment and reduced upper trap activation Baseline: working on postural correction (02/21/23) Goal status: In progress     LONG TERM GOALS: Target date: 04/05/2023    Be independent in advanced HEP Baseline:  Goal status: INITIAL  2.  Improve FOTO to > or = to 66 Baseline: 57 Goal status: INITIAL  3.  Report > or = to 70% reduction in neck and Lt UE pain with daily tasks  Baseline: 0-10/10 Goal status: INITIAL  4.  Return to regular walking program and verbalize safe progression Baseline:  Goal status: INITIAL   PLAN:  PT FREQUENCY: 2x/week  PT DURATION: 8 weeks  PLANNED INTERVENTIONS: Therapeutic exercises, Therapeutic activity, Neuromuscular re-education, Balance training, Gait training, Patient/Family education, Self Care,  Joint mobilization, Vestibular training, Aquatic Therapy, Dry Needling, Spinal mobilization, Cryotherapy, Moist heat, Taping, Traction, Ultrasound, Ionotophoresis 4mg /ml Dexamethasone, Manual therapy, and Re-evaluation  PLAN FOR NEXT SESSION: continue DN to neck and Lt scapula as helpful, postural strength, cervical retraction, snags, update HEP as needed  Lorrene Reid, PT 02/21/23 9:35 AM   [facility] [address], [address] [place], [place] [address] Phone # [phone] Fax [phone]

## 2023-02-24 ENCOUNTER — Encounter: Payer: PPO | Admitting: Physical Therapy

## 2023-02-27 ENCOUNTER — Ambulatory Visit: Payer: PPO | Admitting: Physical Therapy

## 2023-03-01 ENCOUNTER — Ambulatory Visit: Payer: PPO

## 2023-03-01 DIAGNOSIS — R252 Cramp and spasm: Secondary | ICD-10-CM

## 2023-03-01 DIAGNOSIS — M542 Cervicalgia: Secondary | ICD-10-CM | POA: Diagnosis not present

## 2023-03-01 DIAGNOSIS — M6281 Muscle weakness (generalized): Secondary | ICD-10-CM

## 2023-03-01 DIAGNOSIS — R293 Abnormal posture: Secondary | ICD-10-CM

## 2023-03-01 NOTE — Therapy (Signed)
OUTPATIENT PHYSICAL THERAPY CERVICAL TREATMENT   Patient Name: Mary Graves MRN: [id number] DOB:[birth year], 77 y.o., female Today's Date: 03/01/2023  END OF SESSION:  PT End of Session - 03/01/23 1136     Visit Number 5    Date for PT Re-Evaluation 04/05/23    Authorization Type [employer]    Progress Note Due on Visit 10    PT Start Time 1102    PT Stop Time 1147    PT Time Calculation (min) 45 min    Activity Tolerance Patient tolerated treatment well    Behavior During Therapy WFL for tasks assessed/performed                 Past Medical History:  Diagnosis Date   Alcoholism (HCC)    Hypertension    Hypothyroidism    Pancreatitis    Past Surgical History:  Procedure Laterality Date   KNEE SURGERY Left    x 2   NECK SURGERY     bilateral facet arthrosis   TONSILLECTOMY     Patient Active Problem List   Diagnosis Date Noted   Elevated troponin 07/11/2014   Syncope 07/11/2014   PTSD (post-traumatic stress disorder) 10/31/2013   Alcoholism in family 10/31/2013   Dementia (HCC) 10/09/2013   HLD (hyperlipidemia) 02/27/2013   Osteopenia 02/27/2013   Alcohol dependence (HCC) 06/01/2012   Major depression 06/01/2012   Hypothyroidism 02/23/2009   Essential hypertension 02/23/2009   HYPERGLYCEMIA 02/23/2009    PCP: Iva Boop, MD  REFERRING PROVIDER: Venita Lick, MD  REFERRING DIAG: cervicalgia  THERAPY DIAG:  Cervicalgia  Abnormal posture  Muscle weakness (generalized)  Cramp and spasm  Rationale for Evaluation and Treatment: Rehabilitation  ONSET DATE: chronic with flare-up 01/11/23  SUBJECTIVE:                                                                                                                                                                                                         SUBJECTIVE STATEMENT: I had a flare-up of Lt arm pain right before I left for my trip.  I got a refill of Gabapentin to take with  me.  I wasn't as good as I should have been with my exercises while I was away.   Hand dominance: Right  PERTINENT HISTORY:  Hypothyroidism, HTN, depression, PTSD  PAIN:  Are you having pain? Yes: NPRS scale: 0-7/10 Pain location: Lt arm and scapula Pain description: pinching, aching, stabbing  Aggravating factors: moving around  Relieving factors: Gabapentin, Tylenol, rest  PRECAUTIONS: None  WEIGHT BEARING RESTRICTIONS: No  FALLS:  Has patient fallen in last 6 months? No  LIVING ENVIRONMENT: Lives with: lives alone   OCCUPATION: retired   PLOF: Independent, walks for exercise  PATIENT GOALS: stop taking medication, reduce pain in Lt arm, get muscles loosened up   OBJECTIVE:   DIAGNOSTIC FINDINGS:  None recent  PATIENT SURVEYS:  FOTO 57, goal is 32  COGNITION: Overall cognitive status: Within functional limits for tasks assessed  SENSATION: WFL  POSTURE: rounded shoulders and forward head  PALPATION: Reduced and painful cervical segmental mobility, pain with side glides bilaterally.  Tension and trigger points in Rt>Lt upper traps and bil cervical paraspinals.  Atrophy in bil rhomboids   CERVICAL ROM:   Active ROM A/PROM (deg) eval A/ROM 03/01/23  Flexion full   Extension full   Right lateral flexion 40 40  Left lateral flexion 30 35  Right rotation 50 55  Left rotation 50 55   (Blank rows = not tested)  UPPER EXTREMITY ROM: WFLs in bil Ues without pain  UPPER EXTREMITY MMT: 4 to 4+/5 bil UE strength   TODAY'S TREATMENT:    03/01/23 UBE L3 fwd/bwdx 6  min reviewing status Doorway pec stretch 3x 30 sec Cervical A/ROM-3 ways and measures taken after  Supine YTB horizontal ABD 2x 10, diagonals 2x 10 ea Supine OH flexion YTB x 20 Manual: manual traction and passive ROM with mobs Cervical mechanical traction: 15#/5# 60 seconds/10 seconds x 15 min  02/21/23 UBE L3 fwd/bwdx 6  min reviewing status Doorway pec stretch 3x 30 sec Rows GTB x  20 Prone on elbows neck diagonals elbow to opp shoulder x 10 e Supine YTB horizontal ABD 2x 10, diagonals 2x 10 ea Supine OH flexion YTB x 20 Trigger Point Dry-Needling  Treatment instructions: Expect mild to moderate muscle soreness. S/S of pneumothorax if dry needled over a lung field, and to seek immediate medical attention should they occur. Patient verbalized understanding of these instructions and education. Patient Consent Given: Yes Education handout provided: Previously provided Muscles treated: bil cervical multifidi, bil upper traps, suboccipitals Electrical stimulation performed: No Elongation and release after DN Treatment response/outcome: Twitch Response Elicited and Palpable Increase in Muscle Length 02/17/23 UBE L3 fwd/bwdx 6  min reviewing status Doorway pec stretch 3x 30 sec Rows GTB x 20 Extension pull downs GTB x 20 Prone T and Y x 10 B Prone on elbows neck diagonals elbow to opp shoulder x 10 e Supine neck retraction x 10 Supine YTB horizontal ABD 2x 10, diagonals 2x 10 ea Supine OH flexion YTB x 20  PATIENT EDUCATION:  Education details: Access Code: [email or web address] Person educated: Patient Education method: Explanation, Demonstration, and Handouts Education comprehension: verbalized understanding and returned demonstration  HOME EXERCISE PROGRAM: Access Code: [email or web address] URL: [email or web address] Date: 02/17/2023 Prepared by: Raynelle Fanning  Exercises - Seated Cervical Flexion AROM  - 3 x daily - 7 x weekly - 1 sets - 3 reps - 20 hold - Seated Cervical Sidebending AROM  - 3 x daily - 7 x weekly - 1 sets - 3 reps - 20 hold - Seated Cervical Rotation AROM  - 3 x daily - 7 x weekly - 1 sets - 3 reps - 20 hold - Seated Correct Posture  - 1 x daily - 7 x weekly - 3 sets - 10 reps - Doorway Pec Stretch at 90 Degrees Abduction  - 3 x daily - 7 x weekly - 1 sets - 20 reps - Shoulder External Rotation with Resistance  - 2  x daily - 7 x weekly - 2 sets - 10 reps -  Standing Shoulder Row with Anchored Resistance  - 1 x daily - 4 x weekly - 1-3 sets - 10 reps - Standing Shoulder Extension with Resistance  - 1 x daily - 4 x weekly - 1 sets - 10 reps - Cervical Rotation Prone on Elbows  - 2-3 x daily - 7 x weekly - 1- sets - 10 reps  ASSESSMENT:  CLINICAL IMPRESSION: Pt had onset of Lt UE radiculopathy last week.  She has been taking Gabapentin to manage. Pain reports 0-7/10 pain that radiates into the Lt arm.  Trial of cervical traction today to address.  Cervical A/ROM rotation is improved to 55 degrees.  Pt has not been consistent with walking but has not had increased neck pain when she has walked.  Patient will benefit from skilled PT to address the below impairments and improve overall function.    OBJECTIVE IMPAIRMENTS: decreased activity tolerance, decreased ROM, decreased strength, hypomobility, increased muscle spasms, impaired flexibility, postural dysfunction, and pain.   ACTIVITY LIMITATIONS: carrying, lifting, and locomotion level  PARTICIPATION LIMITATIONS: cleaning, driving, shopping, and community activity  PERSONAL FACTORS: Age and 1-2 comorbidities: chronic neck pain, C5-7 HNP  are also affecting patient's functional outcome.   REHAB POTENTIAL: Good  CLINICAL DECISION MAKING: Stable/uncomplicated  EVALUATION COMPLEXITY: Low   GOALS: Goals reviewed with patient? Yes  SHORT TERM GOALS: Target date: 03/08/2023    Be independent in initial HEP Baseline: 02/21/23 Goal status: MET  2.  Report > or = to 30% reduction in neck and Lt UE pain with daily activity  Baseline: 25% reduction (02/21/23) Goal status: In progress   3.  Report and demonstrate postural corrections for alignment and reduced upper trap activation Baseline: working on postural correction (02/21/23) Goal status: In progress     LONG TERM GOALS: Target date: 04/05/2023    Be independent in advanced HEP Baseline:  Goal status: In progress   2.  Improve FOTO to  > or = to 66 Baseline: 57 Goal status: INITIAL  3.  Report > or = to 70% reduction in neck and Lt UE pain with daily tasks  Baseline: 25% (03/01/23) Goal status: In progress  4.  Return to regular walking program and verbalize safe progression Baseline: able to walk inconsistently without increased pain (03/01/23) Goal status: in progress   PLAN:  PT FREQUENCY: 2x/week  PT DURATION: 8 weeks  PLANNED INTERVENTIONS: Therapeutic exercises, Therapeutic activity, Neuromuscular re-education, Balance training, Gait training, Patient/Family education, Self Care, Joint mobilization, Vestibular training, Aquatic Therapy, Dry Needling, Spinal mobilization, Cryotherapy, Moist heat, Taping, Traction, Ultrasound, Ionotophoresis 4mg /ml Dexamethasone, Manual therapy, and Re-evaluation  PLAN FOR NEXT SESSION: continue DN to neck and Lt scapula as helpful, assess response to cervical traction, continue postural strength  Lorrene Reid, PT 03/01/23 11:37 AM   [facility] [address], [address] [place], [place] [address] Phone # [phone] Fax [phone]

## 2023-03-09 ENCOUNTER — Ambulatory Visit: Payer: PPO | Attending: Orthopedic Surgery | Admitting: Physical Therapy

## 2023-03-09 DIAGNOSIS — R252 Cramp and spasm: Secondary | ICD-10-CM | POA: Diagnosis not present

## 2023-03-09 DIAGNOSIS — M542 Cervicalgia: Secondary | ICD-10-CM | POA: Insufficient documentation

## 2023-03-09 DIAGNOSIS — R293 Abnormal posture: Secondary | ICD-10-CM | POA: Insufficient documentation

## 2023-03-09 DIAGNOSIS — M6281 Muscle weakness (generalized): Secondary | ICD-10-CM | POA: Diagnosis not present

## 2023-03-09 NOTE — Therapy (Signed)
OUTPATIENT PHYSICAL THERAPY CERVICAL TREATMENT   Patient Name: Mary Graves MRN: [id number] DOB:04-29-1946, 77 y.o., female Today's Date: 03/09/2023  END OF SESSION:  PT End of Session - 03/09/23 1443     Visit Number 6    Date for PT Re-Evaluation 04/05/23    Authorization Type [employer]    Progress Note Due on Visit 10    PT Start Time 1445    PT Stop Time 1525    PT Time Calculation (min) 40 min    Activity Tolerance Patient tolerated treatment well                 Past Medical History:  Diagnosis Date   Alcoholism (HCC)    Hypertension    Hypothyroidism    Pancreatitis    Past Surgical History:  Procedure Laterality Date   KNEE SURGERY Left    x 2   NECK SURGERY     bilateral facet arthrosis   TONSILLECTOMY     Patient Active Problem List   Diagnosis Date Noted   Elevated troponin 07/11/2014   Syncope 07/11/2014   PTSD (post-traumatic stress disorder) 10/31/2013   Alcoholism in family 10/31/2013   Dementia (HCC) 10/09/2013   HLD (hyperlipidemia) 02/27/2013   Osteopenia 02/27/2013   Alcohol dependence (HCC) 06/01/2012   Major depression 06/01/2012   Hypothyroidism 02/23/2009   Essential hypertension 02/23/2009   HYPERGLYCEMIA 02/23/2009    PCP: Iva Boop, MD  REFERRING PROVIDER: Venita Lick, MD  REFERRING DIAG: cervicalgia  THERAPY DIAG:  Cervicalgia  Abnormal posture  Muscle weakness (generalized)  Rationale for Evaluation and Treatment: Rehabilitation  ONSET DATE: chronic with flare-up 01/11/23  SUBJECTIVE:                                                                                                                                                                                                         SUBJECTIVE STATEMENT: My right upper neck hurts all the time.  I don't have pain right now though.  I came [place] [employer].  I'm thinking of joining [employer] even though [facility] is free.  The traction unit  last time really helps.  My shoulder/upper arm symptoms are 90% better.  Upper neck pain 50% since start of care.    Hand dominance: Right  PERTINENT HISTORY:  Hypothyroidism, HTN, depression, PTSD  PAIN:  Are you having pain? No right now: NPRS scale: 0-7/10 Pain location: Lt arm and scapula Pain description: pinching, aching, stabbing  Aggravating factors: moving around  Relieving factors: Gabapentin, Tylenol, rest  PRECAUTIONS: None  WEIGHT BEARING RESTRICTIONS: No  FALLS:  Has patient fallen in last 6 months? No  LIVING ENVIRONMENT: Lives with: lives alone   OCCUPATION: retired   PLOF: Independent, walks for exercise  PATIENT GOALS: stop taking medication, reduce pain in Lt arm, get muscles loosened up   OBJECTIVE:   DIAGNOSTIC FINDINGS:  None recent  PATIENT SURVEYS:  FOTO 57, goal is 77  COGNITION: Overall cognitive status: Within functional limits for tasks assessed  SENSATION: WFL  POSTURE: rounded shoulders and forward head  PALPATION: Reduced and painful cervical segmental mobility, pain with side glides bilaterally.  Tension and trigger points in Rt>Lt upper traps and bil cervical paraspinals.  Atrophy in bil rhomboids   CERVICAL ROM:   Active ROM A/PROM (deg) eval A/ROM 03/01/23  Flexion full   Extension full   Right lateral flexion 40 40  Left lateral flexion 30 35  Right rotation 50 55  Left rotation 50 55   (Blank rows = not tested)  UPPER EXTREMITY ROM: WFLs in bil Ues without pain  UPPER EXTREMITY MMT: 4 to 4+/5 bil UE strength   TODAY'S TREATMENT:    03/09/23 UBE L3 fwd/bwdx 6  min reviewing status Discussed home traction options Doorway pec stretch 3x 30 sec Cervical SNAG with towel 8x right/left Seated thoracic extension with ball hands behind neck 10x Cervical mechanical traction: 15#/5# 60 seconds/10 seconds x 15 min  03/01/23 UBE L3 fwd/bwdx 6  min reviewing status Doorway pec stretch 3x 30 sec Cervical A/ROM-3 ways  and measures taken after  Supine YTB horizontal ABD 2x 10, diagonals 2x 10 ea Supine OH flexion YTB x 20 Manual: manual traction and passive ROM with mobs Cervical mechanical traction: 15#/5# 60 seconds/10 seconds x 15 min  02/21/23 UBE L3 fwd/bwdx 6  min reviewing status Doorway pec stretch 3x 30 sec Rows GTB x 20 Prone on elbows neck diagonals elbow to opp shoulder x 10 e Supine YTB horizontal ABD 2x 10, diagonals 2x 10 ea Supine OH flexion YTB x 20 Trigger Point Dry-Needling  Treatment instructions: Expect mild to moderate muscle soreness. S/S of pneumothorax if dry needled over a lung field, and to seek immediate medical attention should they occur. Patient verbalized understanding of these instructions and education. Patient Consent Given: Yes Education handout provided: Previously provided Muscles treated: bil cervical multifidi, bil upper traps, suboccipitals Electrical stimulation performed: No Elongation and release after DN Treatment response/outcome: Twitch Response Elicited and Palpable Increase in Muscle Length 02/17/23 UBE L3 fwd/bwdx 6  min reviewing status Doorway pec stretch 3x 30 sec Rows GTB x 20 Extension pull downs GTB x 20 Prone T and Y x 10 B Prone on elbows neck diagonals elbow to opp shoulder x 10 e Supine neck retraction x 10 Supine YTB horizontal ABD 2x 10, diagonals 2x 10 ea Supine OH flexion YTB x 20  PATIENT EDUCATION:  Education details: Access Code: [email or web address] Person educated: Patient Education method: Explanation, Demonstration, and Handouts Education comprehension: verbalized understanding and returned demonstration  HOME EXERCISE PROGRAM: Access Code: [email or web address] URL: [email or web address] Date: 03/09/2023 Prepared by: Lavinia Sharps  Exercises - Seated Cervical Flexion AROM  - 3 x daily - 7 x weekly - 1 sets - 3 reps - 20 hold - Seated Cervical Sidebending AROM  - 3 x daily - 7 x weekly - 1 sets - 3 reps - 20 hold - Seated  Cervical Rotation AROM  - 3 x daily - 7 x weekly - 1 sets - 3 reps - 20  hold - Seated Correct Posture  - 1 x daily - 7 x weekly - 3 sets - 10 reps - Doorway Pec Stretch at 90 Degrees Abduction  - 3 x daily - 7 x weekly - 1 sets - 20 reps - Shoulder External Rotation with Resistance  - 2 x daily - 7 x weekly - 2 sets - 10 reps - Standing Shoulder Row with Anchored Resistance  - 1 x daily - 4 x weekly - 1-3 sets - 10 reps - Standing Shoulder Extension with Resistance  - 1 x daily - 4 x weekly - 1 sets - 10 reps - Cervical Rotation Prone on Elbows  - 2-3 x daily - 7 x weekly - 1- sets - 10 reps - Seated Assisted Cervical Rotation with Towel  - 1 x daily - 7 x weekly - 1 sets - 10 reps - Seated Thoracic Lumbar Extension with Pectoralis Stretch  - 1 x daily - 7 x weekly - 1 sets - 10 reps  ASSESSMENT:  CLINICAL IMPRESSION: Therapist progressing and updating HEP for further functional mobility and pain relief.  Good response from traction and we discussed affordable home options.  Therapist monitoring response to all interventions and modifying treatment accordingly. Progressing with rehab goals with all STGs met.  Significant improvement reported in symptom reduction since start of care.    OBJECTIVE IMPAIRMENTS: decreased activity tolerance, decreased ROM, decreased strength, hypomobility, increased muscle spasms, impaired flexibility, postural dysfunction, and pain.   ACTIVITY LIMITATIONS: carrying, lifting, and locomotion level  PARTICIPATION LIMITATIONS: cleaning, driving, shopping, and community activity  PERSONAL FACTORS: Age and 1-2 comorbidities: chronic neck pain, C5-7 HNP  are also affecting patient's functional outcome.   REHAB POTENTIAL: Good  CLINICAL DECISION MAKING: Stable/uncomplicated  EVALUATION COMPLEXITY: Low   GOALS: Goals reviewed with patient? Yes  SHORT TERM GOALS: Target date: 03/08/2023    Be independent in initial HEP Baseline: 02/21/23 Goal status: MET  2.   Report > or = to 30% reduction in neck and Lt UE pain with daily activity  Baseline: 25% reduction (02/21/23) Goal status: met 8/8 3.  Report and demonstrate postural corrections for alignment and reduced upper trap activation Baseline: working on postural correction (02/21/23) Goal status: met 8/8    LONG TERM GOALS: Target date: 04/05/2023    Be independent in advanced HEP Baseline:  Goal status: In progress   2.  Improve FOTO to > or = to 66 Baseline: 57 Goal status: INITIAL  3.  Report > or = to 70% reduction in neck and Lt UE pain with daily tasks  Baseline: 25% (03/01/23)  50% neck 8/8 and 90% better left UE symptoms Goal status: partially met 8/8  4.  Return to regular walking program and verbalize safe progression Baseline: able to walk inconsistently without increased pain (03/01/23) Goal status: in progress   PLAN:  PT FREQUENCY: 2x/week  PT DURATION: 8 weeks  PLANNED INTERVENTIONS: Therapeutic exercises, Therapeutic activity, Neuromuscular re-education, Balance training, Gait training, Patient/Family education, Self Care, Joint mobilization, Vestibular training, Aquatic Therapy, Dry Needling, Spinal mobilization, Cryotherapy, Moist heat, Taping, Traction, Ultrasound, Ionotophoresis 4mg /ml Dexamethasone, Manual therapy, and Re-evaluation  PLAN FOR NEXT SESSION: continue DN to neck and Lt scapula as helpful, assess response to cervical traction, continue postural strength  Lavinia Sharps, PT 03/09/23 3:19 PM Phone: [phone] Fax: [phone]  [facility] [address], [address] [place], [place] [address] Phone # [phone] Fax [phone]

## 2023-03-13 ENCOUNTER — Ambulatory Visit: Payer: PPO

## 2023-03-13 DIAGNOSIS — R293 Abnormal posture: Secondary | ICD-10-CM

## 2023-03-13 DIAGNOSIS — R252 Cramp and spasm: Secondary | ICD-10-CM

## 2023-03-13 DIAGNOSIS — M542 Cervicalgia: Secondary | ICD-10-CM | POA: Diagnosis not present

## 2023-03-13 DIAGNOSIS — M6281 Muscle weakness (generalized): Secondary | ICD-10-CM

## 2023-03-13 NOTE — Therapy (Signed)
OUTPATIENT PHYSICAL THERAPY CERVICAL TREATMENT   Patient Name: Mary Graves MRN: [id number] DOB:[birth year], 77 y.o., female Today's Date: 03/13/2023  END OF SESSION:  PT End of Session - 03/13/23 1005     Visit Number 7    Date for PT Re-Evaluation 04/05/23    Authorization Type [employer]    Progress Note Due on Visit 10    PT Start Time 0932    PT Stop Time 1020    PT Time Calculation (min) 48 min    Activity Tolerance Patient tolerated treatment well    Behavior During Therapy [facility] for tasks assessed/performed                  Past Medical History:  Diagnosis Date   Alcoholism (HCC)    Hypertension    Hypothyroidism    Pancreatitis    Past Surgical History:  Procedure Laterality Date   KNEE SURGERY Left    x 2   NECK SURGERY     bilateral facet arthrosis   TONSILLECTOMY     Patient Active Problem List   Diagnosis Date Noted   Elevated troponin 07/11/2014   Syncope 07/11/2014   PTSD (post-traumatic stress disorder) 10/31/2013   Alcoholism in family 10/31/2013   Dementia (HCC) 10/09/2013   HLD (hyperlipidemia) 02/27/2013   Osteopenia 02/27/2013   Alcohol dependence (HCC) 06/01/2012   Major depression 06/01/2012   Hypothyroidism 02/23/2009   Essential hypertension 02/23/2009   HYPERGLYCEMIA 02/23/2009    PCP: Iva Boop, MD  REFERRING PROVIDER: Venita Lick, MD  REFERRING DIAG: cervicalgia  THERAPY DIAG:  Cervicalgia  Abnormal posture  Muscle weakness (generalized)  Cramp and spasm  Rationale for Evaluation and Treatment: Rehabilitation  ONSET DATE: chronic with flare-up 01/11/23  SUBJECTIVE:                                                                                                                                                                                                         SUBJECTIVE STATEMENT: I haven't had to take Gabapentin but one time. No pain over the past few days, just stiffness. Traction is  really helping.    Hand dominance: Right  PERTINENT HISTORY:  Hypothyroidism, HTN, depression, PTSD  PAIN:  Are you having pain? No right now: NPRS scale: 0/10 Pain location: Lt arm and scapula Pain description: pinching, aching, stabbing  Aggravating factors: moving around  Relieving factors: Gabapentin, Tylenol, rest  PRECAUTIONS: None  WEIGHT BEARING RESTRICTIONS: No  FALLS:  Has patient fallen in last 6 months? No  LIVING ENVIRONMENT: Lives with: lives  alone   OCCUPATION: retired   PLOF: Independent, walks for exercise  PATIENT GOALS: stop taking medication, reduce pain in Lt arm, get muscles loosened up   OBJECTIVE:   DIAGNOSTIC FINDINGS:  None recent  PATIENT SURVEYS:  FOTO 57, goal is 66  COGNITION: Overall cognitive status: Within functional limits for tasks assessed  SENSATION: WFL  POSTURE: rounded shoulders and forward head  PALPATION: Reduced and painful cervical segmental mobility, pain with side glides bilaterally.  Tension and trigger points in Rt>Lt upper traps and bil cervical paraspinals.  Atrophy in bil rhomboids   CERVICAL ROM:   Active ROM A/PROM (deg) eval A/ROM 03/01/23  Flexion full   Extension full   Right lateral flexion 40 40  Left lateral flexion 30 35  Right rotation 50 55  Left rotation 50 55   (Blank rows = not tested)  UPPER EXTREMITY ROM: WFLs in bil Ues without pain  UPPER EXTREMITY MMT: 4 to 4+/5 bil UE strength   TODAY'S TREATMENT:    03/13/23 UBE L3 fwd/bwdx 6  min reviewing status Discussed home traction options-[employer] unit vs options on [employer]  Doorway pec stretch 3x 30 sec Cervical SNAG with towel 10x right/left Seated thoracic extension with ball hands behind neck 10x Rows with red band: 2xx10 Cervical mechanical traction: 16#/5# 60 seconds/10 seconds x 15 min 03/09/23 UBE L3 fwd/bwdx 6  min reviewing status Discussed home traction options Doorway pec stretch 3x 30 sec Cervical SNAG with towel  8x right/left Seated thoracic extension with ball hands behind neck 10x Cervical mechanical traction: 15#/5# 60 seconds/10 seconds x 15 min  03/01/23 UBE L3 fwd/bwdx 6  min reviewing status Doorway pec stretch 3x 30 sec Cervical A/ROM-3 ways and measures taken after  Supine YTB horizontal ABD 2x 10, diagonals 2x 10 ea Supine OH flexion YTB x 20 Manual: manual traction and passive ROM with mobs Cervical mechanical traction: 15#/5# 60 seconds/10 seconds x 15 min  PATIENT EDUCATION:  Education details: Access Code: [email or web address] Person educated: Patient Education method: Explanation, Demonstration, and Handouts Education comprehension: verbalized understanding and returned demonstration  HOME EXERCISE PROGRAM: Access Code: [email or web address] URL: [email or web address] Date: 03/09/2023 Prepared by: Lavinia Sharps  Exercises - Seated Cervical Flexion AROM  - 3 x daily - 7 x weekly - 1 sets - 3 reps - 20 hold - Seated Cervical Sidebending AROM  - 3 x daily - 7 x weekly - 1 sets - 3 reps - 20 hold - Seated Cervical Rotation AROM  - 3 x daily - 7 x weekly - 1 sets - 3 reps - 20 hold - Seated Correct Posture  - 1 x daily - 7 x weekly - 3 sets - 10 reps - Doorway Pec Stretch at 90 Degrees Abduction  - 3 x daily - 7 x weekly - 1 sets - 20 reps - Shoulder External Rotation with Resistance  - 2 x daily - 7 x weekly - 2 sets - 10 reps - Standing Shoulder Row with Anchored Resistance  - 1 x daily - 4 x weekly - 1-3 sets - 10 reps - Standing Shoulder Extension with Resistance  - 1 x daily - 4 x weekly - 1 sets - 10 reps - Cervical Rotation Prone on Elbows  - 2-3 x daily - 7 x weekly - 1- sets - 10 reps - Seated Assisted Cervical Rotation with Towel  - 1 x daily - 7 x weekly - 1 sets - 10 reps - Seated Thoracic Lumbar Extension with Pectoralis  Stretch  - 1 x daily - 7 x weekly - 1 sets - 10 reps  ASSESSMENT:  CLINICAL IMPRESSION: Pt reports 60% overall improvement in symptoms since the start of  care and denies any pain over the past few days.  Traction has helped and PT discussed home options.  Pt continues to work on mobility and postural strength at home.  Good alignment with exercises today with few verbal cues required.  Significant improvement reported in symptom reduction since start of care.    OBJECTIVE IMPAIRMENTS: decreased activity tolerance, decreased ROM, decreased strength, hypomobility, increased muscle spasms, impaired flexibility, postural dysfunction, and pain.   ACTIVITY LIMITATIONS: carrying, lifting, and locomotion level  PARTICIPATION LIMITATIONS: cleaning, driving, shopping, and community activity  PERSONAL FACTORS: Age and 1-2 comorbidities: chronic neck pain, C5-7 HNP  are also affecting patient's functional outcome.   REHAB POTENTIAL: Good  CLINICAL DECISION MAKING: Stable/uncomplicated  EVALUATION COMPLEXITY: Low   GOALS: Goals reviewed with patient? Yes  SHORT TERM GOALS: Target date: 03/08/2023    Be independent in initial HEP Baseline: 02/21/23 Goal status: MET  2.  Report > or = to 30% reduction in neck and Lt UE pain with daily activity  Baseline: 25% reduction (02/21/23) Goal status: met 8/8 3.  Report and demonstrate postural corrections for alignment and reduced upper trap activation Baseline: working on postural correction (02/21/23) Goal status: met 8/8    LONG TERM GOALS: Target date: 04/05/2023    Be independent in advanced HEP Baseline:  Goal status: In progress   2.  Improve FOTO to > or = to 66 Baseline: 57 Goal status: INITIAL  3.  Report > or = to 70% reduction in neck and Lt UE pain with daily tasks  Baseline: 25% (03/01/23)  50% neck 8/8 and 90% better left UE symptoms Goal status: partially met 8/8  4.  Return to regular walking program and verbalize safe progression Baseline: able to walk inconsistently without increased pain (03/01/23) Goal status: in progress   PLAN:  PT FREQUENCY: 2x/week  PT DURATION: 8  weeks  PLANNED INTERVENTIONS: Therapeutic exercises, Therapeutic activity, Neuromuscular re-education, Balance training, Gait training, Patient/Family education, Self Care, Joint mobilization, Vestibular training, Aquatic Therapy, Dry Needling, Spinal mobilization, Cryotherapy, Moist heat, Taping, Traction, Ultrasound, Ionotophoresis 4mg /ml Dexamethasone, Manual therapy, and Re-evaluation  PLAN FOR NEXT SESSION: continue traction, postural strength, DN as needed  Lorrene Reid, PT 03/13/23 10:05 AM  Phone: [phone] Fax: [phone]  [facility] [address] [place], [place] [address] Phone # [phone] Fax [phone]

## 2023-03-14 DIAGNOSIS — E039 Hypothyroidism, unspecified: Secondary | ICD-10-CM | POA: Diagnosis not present

## 2023-03-14 DIAGNOSIS — Z6821 Body mass index (BMI) 21.0-21.9, adult: Secondary | ICD-10-CM | POA: Diagnosis not present

## 2023-03-14 DIAGNOSIS — Z0000 Encounter for general adult medical examination without abnormal findings: Secondary | ICD-10-CM | POA: Diagnosis not present

## 2023-03-14 DIAGNOSIS — I10 Essential (primary) hypertension: Secondary | ICD-10-CM | POA: Diagnosis not present

## 2023-03-14 DIAGNOSIS — E559 Vitamin D deficiency, unspecified: Secondary | ICD-10-CM | POA: Diagnosis not present

## 2023-03-14 DIAGNOSIS — Z1389 Encounter for screening for other disorder: Secondary | ICD-10-CM | POA: Diagnosis not present

## 2023-03-14 DIAGNOSIS — E785 Hyperlipidemia, unspecified: Secondary | ICD-10-CM | POA: Diagnosis not present

## 2023-03-16 ENCOUNTER — Ambulatory Visit: Payer: PPO

## 2023-03-16 DIAGNOSIS — M6281 Muscle weakness (generalized): Secondary | ICD-10-CM

## 2023-03-16 DIAGNOSIS — R293 Abnormal posture: Secondary | ICD-10-CM

## 2023-03-16 DIAGNOSIS — R252 Cramp and spasm: Secondary | ICD-10-CM

## 2023-03-16 DIAGNOSIS — M542 Cervicalgia: Secondary | ICD-10-CM

## 2023-03-16 NOTE — Therapy (Signed)
OUTPATIENT PHYSICAL THERAPY CERVICAL TREATMENT   Patient Name: Mary Graves MRN: [id number] DOB:[birth year], 77 y.o., female Today's Date: 03/16/2023  END OF SESSION:  PT End of Session - 03/16/23 0957     Visit Number 8    Date for PT Re-Evaluation 04/05/23    Authorization Type [employer]    Progress Note Due on Visit 10    PT Start Time 0930    PT Stop Time 1014    PT Time Calculation (min) 44 min    Activity Tolerance Patient tolerated treatment well    Behavior During Therapy [facility] for tasks assessed/performed                   Past Medical History:  Diagnosis Date   Alcoholism (HCC)    Hypertension    Hypothyroidism    Pancreatitis    Past Surgical History:  Procedure Laterality Date   KNEE SURGERY Left    x 2   NECK SURGERY     bilateral facet arthrosis   TONSILLECTOMY     Patient Active Problem List   Diagnosis Date Noted   Elevated troponin 07/11/2014   Syncope 07/11/2014   PTSD (post-traumatic stress disorder) 10/31/2013   Alcoholism in family 10/31/2013   Dementia (HCC) 10/09/2013   HLD (hyperlipidemia) 02/27/2013   Osteopenia 02/27/2013   Alcohol dependence (HCC) 06/01/2012   Major depression 06/01/2012   Hypothyroidism 02/23/2009   Essential hypertension 02/23/2009   HYPERGLYCEMIA 02/23/2009    PCP: Iva Boop, MD  REFERRING PROVIDER: Venita Lick, MD  REFERRING DIAG: cervicalgia  THERAPY DIAG:  Cervicalgia  Abnormal posture  Muscle weakness (generalized)  Cramp and spasm  Rationale for Evaluation and Treatment: Rehabilitation  ONSET DATE: chronic with flare-up 01/11/23  SUBJECTIVE:                                                                                                                                                                                                         SUBJECTIVE STATEMENT: I am still feeling good without any arm pain.  I was able to use weights in the pool when I walked.      Hand dominance: Right  PERTINENT HISTORY:  Hypothyroidism, HTN, depression, PTSD  PAIN:  Are you having pain? No right now: NPRS scale: 0/10 Pain location: Lt arm and scapula Pain description: pinching, aching, stabbing  Aggravating factors: moving around  Relieving factors: Gabapentin, Tylenol, rest  PRECAUTIONS: None  WEIGHT BEARING RESTRICTIONS: No  FALLS:  Has patient fallen in last 6 months? No  LIVING ENVIRONMENT: Lives  with: lives alone   OCCUPATION: retired   PLOF: Independent, walks for exercise  PATIENT GOALS: stop taking medication, reduce pain in Lt arm, get muscles loosened up   OBJECTIVE:   DIAGNOSTIC FINDINGS:  None recent  PATIENT SURVEYS:  FOTO 57, goal is 61  COGNITION: Overall cognitive status: Within functional limits for tasks assessed  SENSATION: WFL  POSTURE: rounded shoulders and forward head  PALPATION: Reduced and painful cervical segmental mobility, pain with side glides bilaterally.  Tension and trigger points in Rt>Lt upper traps and bil cervical paraspinals.  Atrophy in bil rhomboids   CERVICAL ROM:   Active ROM A/PROM (deg) eval A/ROM 03/01/23  Flexion full   Extension full   Right lateral flexion 40 40  Left lateral flexion 30 35  Right rotation 50 55  Left rotation 50 55   (Blank rows = not tested)  UPPER EXTREMITY ROM: WFLs in bil Ues without pain  UPPER EXTREMITY MMT: 4 to 4+/5 bil UE strength   TODAY'S TREATMENT:    03/16/23 UBE L3 fwd/bwdx 6  min reviewing status PT faxed info to [employer] for home Sa[employer] traction unit Doorway pec stretch 3x 30 sec Wall clocks: green loop 2x5 reps Rt and Lt Cervical SNAG with towel 10x right/left Seated thoracic extension with ball hands behind neck 10x Rows with red band: 2x10, shoulder extension with red band: 2x10 Cervical mechanical traction: 16#/5# 60 seconds/10 seconds x 15 min 03/13/23 UBE L3 fwd/bwdx 6  min reviewing status Discussed home traction  options-[employer] unit vs options on [employer]  Doorway pec stretch 3x 30 sec Cervical SNAG with towel 10x right/left Seated thoracic extension with ball hands behind neck 10x Rows with red band: 2xx10 Cervical mechanical traction: 16#/5# 60 seconds/10 seconds x 15 min 03/09/23 UBE L3 fwd/bwdx 6  min reviewing status Discussed home traction options Doorway pec stretch 3x 30 sec Cervical SNAG with towel 8x right/left Seated thoracic extension with ball hands behind neck 10x Cervical mechanical traction: 15#/5# 60 seconds/10 seconds x 15 min  03/01/23 UBE L3 fwd/bwdx 6  min reviewing status Doorway pec stretch 3x 30 sec Cervical A/ROM-3 ways and measures taken after  Supine YTB horizontal ABD 2x 10, diagonals 2x 10 ea Supine OH flexion YTB x 20 Manual: manual traction and passive ROM with mobs Cervical mechanical traction: 15#/5# 60 seconds/10 seconds x 15 min  PATIENT EDUCATION:  Education details: Access Code: [email or web address] Person educated: Patient Education method: Explanation, Demonstration, and Handouts Education comprehension: verbalized understanding and returned demonstration  HOME EXERCISE PROGRAM: Access Code: [email or web address] URL: [email or web address] Date: 03/09/2023 Prepared by: Lavinia Sharps  Exercises - Seated Cervical Flexion AROM  - 3 x daily - 7 x weekly - 1 sets - 3 reps - 20 hold - Seated Cervical Sidebending AROM  - 3 x daily - 7 x weekly - 1 sets - 3 reps - 20 hold - Seated Cervical Rotation AROM  - 3 x daily - 7 x weekly - 1 sets - 3 reps - 20 hold - Seated Correct Posture  - 1 x daily - 7 x weekly - 3 sets - 10 reps - Doorway Pec Stretch at 90 Degrees Abduction  - 3 x daily - 7 x weekly - 1 sets - 20 reps - Shoulder External Rotation with Resistance  - 2 x daily - 7 x weekly - 2 sets - 10 reps - Standing Shoulder Row with Anchored Resistance  - 1 x daily - 4 x weekly - 1-3 sets -  10 reps - Standing Shoulder Extension with Resistance  - 1 x daily - 4 x  weekly - 1 sets - 10 reps - Cervical Rotation Prone on Elbows  - 2-3 x daily - 7 x weekly - 1- sets - 10 reps - Seated Assisted Cervical Rotation with Towel  - 1 x daily - 7 x weekly - 1 sets - 10 reps - Seated Thoracic Lumbar Extension with Pectoralis Stretch  - 1 x daily - 7 x weekly - 1 sets - 10 reps  ASSESSMENT:  CLINICAL IMPRESSION: Pt reports 60% overall improvement in cervical symptoms since the start of care and denies any pain over the past few days. Lt UE radiculopathy has been reduced by 80-90%.  Traction has helped  significantly and a home unit would be beneficial to reduce radicular symptoms.  Pt continues to work on mobility and postural strength at home.  Good alignment with exercises today with few verbal cues required.  Endurance for postural strength exercises is improving and she has added upper body weights to her water walking without significant soreness.     OBJECTIVE IMPAIRMENTS: decreased activity tolerance, decreased ROM, decreased strength, hypomobility, increased muscle spasms, impaired flexibility, postural dysfunction, and pain.   ACTIVITY LIMITATIONS: carrying, lifting, and locomotion level  PARTICIPATION LIMITATIONS: cleaning, driving, shopping, and community activity  PERSONAL FACTORS: Age and 1-2 comorbidities: chronic neck pain, C5-7 HNP  are also affecting patient's functional outcome.   REHAB POTENTIAL: Good  CLINICAL DECISION MAKING: Stable/uncomplicated  EVALUATION COMPLEXITY: Low   GOALS: Goals reviewed with patient? Yes  SHORT TERM GOALS: Target date: 03/08/2023    Be independent in initial HEP Baseline: 02/21/23 Goal status: MET  2.  Report > or = to 30% reduction in neck and Lt UE pain with daily activity  Baseline: 25% reduction (02/21/23) Goal status: met 8/8 3.  Report and demonstrate postural corrections for alignment and reduced upper trap activation Baseline: working on postural correction (02/21/23) Goal status: met  8/8    LONG TERM GOALS: Target date: 04/05/2023    Be independent in advanced HEP Baseline:  Goal status: In progress   2.  Improve FOTO to > or = to 66 Baseline: 57 Goal status: INITIAL  3.  Report > or = to 70% reduction in neck and Lt UE pain with daily tasks  Baseline: 25% (03/01/23)  50% neck 8/8 and 90% better left UE symptoms Goal status: partially met 8/8  4.  Return to regular walking program and verbalize safe progression Baseline: able to walk inconsistently without increased pain (03/01/23) Goal status: in progress   PLAN:  PT FREQUENCY: 2x/week  PT DURATION: 8 weeks  PLANNED INTERVENTIONS: Therapeutic exercises, Therapeutic activity, Neuromuscular re-education, Balance training, Gait training, Patient/Family education, Self Care, Joint mobilization, Vestibular training, Aquatic Therapy, Dry Needling, Spinal mobilization, Cryotherapy, Moist heat, Taping, Traction, Ultrasound, Ionotophoresis 4mg /ml Dexamethasone, Manual therapy, and Re-evaluation  PLAN FOR NEXT SESSION: continue traction, postural strength, DN as needed  Lorrene Reid, PT 03/16/23 10:01 AM  Phone: [phone] Fax: [phone]  [facility] [address] [place], [place] [address] Phone # [phone] Fax [phone]

## 2023-03-20 ENCOUNTER — Ambulatory Visit: Payer: PPO

## 2023-03-20 DIAGNOSIS — M810 Age-related osteoporosis without current pathological fracture: Secondary | ICD-10-CM | POA: Diagnosis not present

## 2023-03-20 DIAGNOSIS — Z23 Encounter for immunization: Secondary | ICD-10-CM | POA: Diagnosis not present

## 2023-03-20 DIAGNOSIS — E559 Vitamin D deficiency, unspecified: Secondary | ICD-10-CM | POA: Diagnosis not present

## 2023-03-20 DIAGNOSIS — M542 Cervicalgia: Secondary | ICD-10-CM | POA: Diagnosis not present

## 2023-03-20 DIAGNOSIS — Z6821 Body mass index (BMI) 21.0-21.9, adult: Secondary | ICD-10-CM | POA: Diagnosis not present

## 2023-03-20 DIAGNOSIS — R293 Abnormal posture: Secondary | ICD-10-CM

## 2023-03-20 DIAGNOSIS — E039 Hypothyroidism, unspecified: Secondary | ICD-10-CM | POA: Diagnosis not present

## 2023-03-20 DIAGNOSIS — E785 Hyperlipidemia, unspecified: Secondary | ICD-10-CM | POA: Diagnosis not present

## 2023-03-20 DIAGNOSIS — F331 Major depressive disorder, recurrent, moderate: Secondary | ICD-10-CM | POA: Diagnosis not present

## 2023-03-20 DIAGNOSIS — R252 Cramp and spasm: Secondary | ICD-10-CM

## 2023-03-20 DIAGNOSIS — I10 Essential (primary) hypertension: Secondary | ICD-10-CM | POA: Diagnosis not present

## 2023-03-20 DIAGNOSIS — R0602 Shortness of breath: Secondary | ICD-10-CM | POA: Diagnosis not present

## 2023-03-20 DIAGNOSIS — N1832 Chronic kidney disease, stage 3b: Secondary | ICD-10-CM | POA: Diagnosis not present

## 2023-03-20 DIAGNOSIS — M6281 Muscle weakness (generalized): Secondary | ICD-10-CM

## 2023-03-20 DIAGNOSIS — F1020 Alcohol dependence, uncomplicated: Secondary | ICD-10-CM | POA: Diagnosis not present

## 2023-03-20 NOTE — Therapy (Signed)
OUTPATIENT PHYSICAL THERAPY CERVICAL TREATMENT   Patient Name: Mary Graves MRN: [id number] DOB:April 11, 1946, 77 y.o., female Today's Date: 03/20/2023  END OF SESSION:  PT End of Session - 03/20/23 1309     Visit Number 9    Date for PT Re-Evaluation 04/05/23    Authorization Type [employer]    Progress Note Due on Visit 10    PT Start Time 1234    PT Stop Time 1320    PT Time Calculation (min) 46 min    Activity Tolerance Patient tolerated treatment well    Behavior During Therapy WFL for tasks assessed/performed                    Past Medical History:  Diagnosis Date   Alcoholism (HCC)    Hypertension    Hypothyroidism    Pancreatitis    Past Surgical History:  Procedure Laterality Date   KNEE SURGERY Left    x 2   NECK SURGERY     bilateral facet arthrosis   TONSILLECTOMY     Patient Active Problem List   Diagnosis Date Noted   Elevated troponin 07/11/2014   Syncope 07/11/2014   PTSD (post-traumatic stress disorder) 10/31/2013   Alcoholism in family 10/31/2013   Dementia (HCC) 10/09/2013   HLD (hyperlipidemia) 02/27/2013   Osteopenia 02/27/2013   Alcohol dependence (HCC) 06/01/2012   Major depression 06/01/2012   Hypothyroidism 02/23/2009   Essential hypertension 02/23/2009   HYPERGLYCEMIA 02/23/2009    PCP: Iva Boop, MD  REFERRING PROVIDER: Venita Lick, MD  REFERRING DIAG: cervicalgia  THERAPY DIAG:  Cervicalgia  Abnormal posture  Muscle weakness (generalized)  Cramp and spasm  Rationale for Evaluation and Treatment: Rehabilitation  ONSET DATE: chronic with flare-up 01/11/23  SUBJECTIVE:                                                                                                                                                                                                         SUBJECTIVE STATEMENT: I think the company for the traction unit has called me but I haven't called them back yet.     Hand  dominance: Right  PERTINENT HISTORY:  Hypothyroidism, HTN, depression, PTSD  PAIN:  Are you having pain? No right now: NPRS scale: 0/10 Pain location: Lt arm and scapula Pain description: pinching, aching, stabbing  Aggravating factors: moving around  Relieving factors: Gabapentin, Tylenol, rest  PRECAUTIONS: None  WEIGHT BEARING RESTRICTIONS: No  FALLS:  Has patient fallen in last 6 months? No  LIVING ENVIRONMENT: Lives with: lives alone  OCCUPATION: retired   PLOF: Independent, walks for exercise  PATIENT GOALS: stop taking medication, reduce pain in Lt arm, get muscles loosened up   OBJECTIVE:   DIAGNOSTIC FINDINGS:  None recent  PATIENT SURVEYS:  FOTO 57, goal is 73  COGNITION: Overall cognitive status: Within functional limits for tasks assessed  SENSATION: WFL  POSTURE: rounded shoulders and forward head  PALPATION: Reduced and painful cervical segmental mobility, pain with side glides bilaterally.  Tension and trigger points in Rt>Lt upper traps and bil cervical paraspinals.  Atrophy in bil rhomboids   CERVICAL ROM:   Active ROM A/PROM (deg) eval A/ROM 03/01/23  Flexion full   Extension full   Right lateral flexion 40 40  Left lateral flexion 30 35  Right rotation 50 55  Left rotation 50 55   (Blank rows = not tested)  UPPER EXTREMITY ROM: WFLs in bil Ues without pain  UPPER EXTREMITY MMT: 4 to 4+/5 bil UE strength   TODAY'S TREATMENT:    03/20/23 UBE L3 fwd/bwdx 6  min reviewing status Doorway pec stretch 3x 30 sec Wall clocks: green loop 2x5 reps Rt and Lt Seated 3 way raises: 1# added x20 Seated thoracic extension with ball hands behind neck 10x Rows with red band: 2x10, shoulder extension with red band: 2x10 Cervical mechanical traction: 17#/5# 60 seconds/10 seconds x 15 min  03/16/23 UBE L3 fwd/bwdx 6  min reviewing status PT faxed info to [employer] for home Saunders traction unit Doorway pec stretch 3x 30 sec Wall  clocks: green loop 2x5 reps Rt and Lt Cervical SNAG with towel 10x right/left Seated thoracic extension with ball hands behind neck 10x Rows with red band: 2x10, shoulder extension with red band: 2x10 Cervical mechanical traction: 16#/5# 60 seconds/10 seconds x 15 min 03/13/23 UBE L3 fwd/bwdx 6  min reviewing status Discussed home traction options-[employer] unit vs options on [employer]  Doorway pec stretch 3x 30 sec Cervical SNAG with towel 10x right/left Seated thoracic extension with ball hands behind neck 10x Rows with red band: 2xx10 Cervical mechanical traction: 16#/5# 60 seconds/10 seconds x 15 min  PATIENT EDUCATION:  Education details: Access Code: [email or web address] Person educated: Patient Education method: Explanation, Demonstration, and Handouts Education comprehension: verbalized understanding and returned demonstration  HOME EXERCISE PROGRAM: Access Code: [email or web address] URL: [email or web address] Date: 03/09/2023 Prepared by: Lavinia Sharps  Exercises - Seated Cervical Flexion AROM  - 3 x daily - 7 x weekly - 1 sets - 3 reps - 20 hold - Seated Cervical Sidebending AROM  - 3 x daily - 7 x weekly - 1 sets - 3 reps - 20 hold - Seated Cervical Rotation AROM  - 3 x daily - 7 x weekly - 1 sets - 3 reps - 20 hold - Seated Correct Posture  - 1 x daily - 7 x weekly - 3 sets - 10 reps - Doorway Pec Stretch at 90 Degrees Abduction  - 3 x daily - 7 x weekly - 1 sets - 20 reps - Shoulder External Rotation with Resistance  - 2 x daily - 7 x weekly - 2 sets - 10 reps - Standing Shoulder Row with Anchored Resistance  - 1 x daily - 4 x weekly - 1-3 sets - 10 reps - Standing Shoulder Extension with Resistance  - 1 x daily - 4 x weekly - 1 sets - 10 reps - Cervical Rotation Prone on Elbows  - 2-3 x daily - 7 x weekly - 1- sets - 10 reps -  Seated Assisted Cervical Rotation with Towel  - 1 x daily - 7 x weekly - 1 sets - 10 reps - Seated Thoracic Lumbar Extension with Pectoralis Stretch  - 1 x  daily - 7 x weekly - 1 sets - 10 reps  ASSESSMENT:  CLINICAL IMPRESSION: Pt continues to report Lt UE radiculopathy has been reduced by 80-90%.  Traction has helped  significantly and a home unit would be beneficial to reduce radicular symptoms.  Pt continues to work on mobility and postural strength at home.  Pt is doing well with endurance postural  tasks. Endurance for postural strength exercises is improving and she has added upper body weights to her water walking without significant soreness.     OBJECTIVE IMPAIRMENTS: decreased activity tolerance, decreased ROM, decreased strength, hypomobility, increased muscle spasms, impaired flexibility, postural dysfunction, and pain.   ACTIVITY LIMITATIONS: carrying, lifting, and locomotion level  PARTICIPATION LIMITATIONS: cleaning, driving, shopping, and community activity  PERSONAL FACTORS: Age and 1-2 comorbidities: chronic neck pain, C5-7 HNP  are also affecting patient's functional outcome.   REHAB POTENTIAL: Good  CLINICAL DECISION MAKING: Stable/uncomplicated  EVALUATION COMPLEXITY: Low   GOALS: Goals reviewed with patient? Yes  SHORT TERM GOALS: Target date: 03/08/2023    Be independent in initial HEP Baseline: 02/21/23 Goal status: MET  2.  Report > or = to 30% reduction in neck and Lt UE pain with daily activity  Baseline: 25% reduction (02/21/23) Goal status: met 8/8 3.  Report and demonstrate postural corrections for alignment and reduced upper trap activation Baseline: working on postural correction (02/21/23) Goal status: met 8/8    LONG TERM GOALS: Target date: 04/05/2023    Be independent in advanced HEP Baseline:  Goal status: In progress   2.  Improve FOTO to > or = to 66 Baseline: 57 Goal status: INITIAL  3.  Report > or = to 70% reduction in neck and Lt UE pain with daily tasks  Baseline: 25% (03/01/23)  50% neck 8/8 and 90% better left UE symptoms Goal status: partially met 8/8  4.  Return to regular  walking program and verbalize safe progression Baseline: able to walk inconsistently without increased pain (03/01/23) Goal status: in progress   PLAN:  PT FREQUENCY: 2x/week  PT DURATION: 8 weeks  PLANNED INTERVENTIONS: Therapeutic exercises, Therapeutic activity, Neuromuscular re-education, Balance training, Gait training, Patient/Family education, Self Care, Joint mobilization, Vestibular training, Aquatic Therapy, Dry Needling, Spinal mobilization, Cryotherapy, Moist heat, Taping, Traction, Ultrasound, Ionotophoresis 4mg /ml Dexamethasone, Manual therapy, and Re-evaluation  PLAN FOR NEXT SESSION: continue traction, postural strength, DN as needed  Lorrene Reid, PT 03/20/23 1:19 PM  Phone: [phone] Fax: [phone]  [facility] [address] [place], [place] [address] Phone # [phone] Fax [phone]

## 2023-03-22 ENCOUNTER — Other Ambulatory Visit: Payer: Self-pay | Admitting: Family Medicine

## 2023-03-22 DIAGNOSIS — Z13820 Encounter for screening for osteoporosis: Secondary | ICD-10-CM

## 2023-03-23 ENCOUNTER — Ambulatory Visit: Payer: PPO

## 2023-03-23 DIAGNOSIS — M542 Cervicalgia: Secondary | ICD-10-CM | POA: Diagnosis not present

## 2023-03-23 DIAGNOSIS — R252 Cramp and spasm: Secondary | ICD-10-CM

## 2023-03-23 DIAGNOSIS — R293 Abnormal posture: Secondary | ICD-10-CM

## 2023-03-23 DIAGNOSIS — M6281 Muscle weakness (generalized): Secondary | ICD-10-CM

## 2023-03-23 NOTE — Therapy (Signed)
OUTPATIENT PHYSICAL THERAPY CERVICAL TREATMENT   Patient Name: Mary Graves MRN: [id number] DOB:07-06-1946, 77 y.o., female Today's Date: 03/23/2023 Progress Note Reporting Period 02/08/23 to 03/23/23  See note below for Objective Data and Assessment of Progress/Goals.     END OF SESSION:  PT End of Session - 03/23/23 0936     Visit Number 10    Date for PT Re-Evaluation 04/05/23    Authorization Type [employer]    Progress Note Due on Visit 10    PT Start Time 0932    PT Stop Time 1018    PT Time Calculation (min) 46 min    Activity Tolerance Patient tolerated treatment well    Behavior During Therapy WFL for tasks assessed/performed                     Past Medical History:  Diagnosis Date   Alcoholism (HCC)    Hypertension    Hypothyroidism    Pancreatitis    Past Surgical History:  Procedure Laterality Date   KNEE SURGERY Left    x 2   NECK SURGERY     bilateral facet arthrosis   TONSILLECTOMY     Patient Active Problem List   Diagnosis Date Noted   Elevated troponin 07/11/2014   Syncope 07/11/2014   PTSD (post-traumatic stress disorder) 10/31/2013   Alcoholism in family 10/31/2013   Dementia (HCC) 10/09/2013   HLD (hyperlipidemia) 02/27/2013   Osteopenia 02/27/2013   Alcohol dependence (HCC) 06/01/2012   Major depression 06/01/2012   Hypothyroidism 02/23/2009   Essential hypertension 02/23/2009   HYPERGLYCEMIA 02/23/2009    PCP: Iva Boop, MD  REFERRING PROVIDER: Venita Lick, MD  REFERRING DIAG: cervicalgia  THERAPY DIAG:  Cervicalgia  Abnormal posture  Muscle weakness (generalized)  Cramp and spasm  Rationale for Evaluation and Treatment: Rehabilitation  ONSET DATE: chronic with flare-up 01/11/23  SUBJECTIVE:                                                                                                                                                                                                          SUBJECTIVE STATEMENT: I talked to both the traction unit company and the doctor's office to discuss insurance options.  Hand dominance: Right  PERTINENT HISTORY:  Hypothyroidism, HTN, depression, PTSD  PAIN:  Are you having pain? No right now: NPRS scale: 0/10 Pain location: Lt arm and scapula Pain description: pinching, aching, stabbing  Aggravating factors: moving around  Relieving factors: Gabapentin, Tylenol, rest  PRECAUTIONS: None  WEIGHT BEARING RESTRICTIONS: No  FALLS:  Has patient fallen in last 6 months? No  LIVING ENVIRONMENT: Lives with: lives alone   OCCUPATION: retired   PLOF: Independent, walks for exercise  PATIENT GOALS: stop taking medication, reduce pain in Lt arm, get muscles loosened up   OBJECTIVE:   DIAGNOSTIC FINDINGS:  None recent  PATIENT SURVEYS:  FOTO 57, goal is 107  COGNITION: Overall cognitive status: Within functional limits for tasks assessed  SENSATION: WFL  POSTURE: rounded shoulders and forward head  PALPATION: Reduced and painful cervical segmental mobility, pain with side glides bilaterally.  Tension and trigger points in Rt>Lt upper traps and bil cervical paraspinals.  Atrophy in bil rhomboids   CERVICAL ROM:   Active ROM A/PROM (deg) eval A/ROM 03/01/23 A/ROM 03/23/23  Flexion full    Extension full    Right lateral flexion 40 40 40  Left lateral flexion 30 35 35  Right rotation 50 55 60  Left rotation 50 55 60   (Blank rows = not tested)  UPPER EXTREMITY ROM: WFLs in bil Ues without pain  UPPER EXTREMITY MMT: 4 to 4+/5 bil UE strength   TODAY'S TREATMENT:     03/23/23 UBE L3 fwd/bwdx 6  min reviewing status Doorway pec stretch 3x 30 sec Wall clocks: green loop 2x5 reps Rt and Lt Seated 3 way raises: 1# added x20 Seated thoracic extension with ball hands behind neck 10x Rows with green band: 2x10, shoulder extension with green band: 2x10 Cervical mechanical traction: 17#/5# 60 seconds/10 seconds  x 13 min  03/20/23 UBE L3 fwd/bwdx 6  min reviewing status Doorway pec stretch 3x 30 sec Wall clocks: green loop 2x5 reps Rt and Lt Seated 3 way raises: 1# added x20 Seated thoracic extension with ball hands behind neck 10x Rows with red band: 2x10, shoulder extension with red band: 2x10 Cervical mechanical traction: 17#/5# 60 seconds/10 seconds x 15 min  03/16/23 UBE L3 fwd/bwdx 6  min reviewing status PT faxed info to [employer] for home [employer] traction unit Doorway pec stretch 3x 30 sec Wall clocks: green loop 2x5 reps Rt and Lt Cervical SNAG with towel 10x right/left Seated thoracic extension with ball hands behind neck 10x Rows with red band: 2x10, shoulder extension with red band: 2x10 Cervical mechanical traction: 16#/5# 60 seconds/10 seconds x 15 min   PATIENT EDUCATION:  Education details: Access Code: [email or web address] Person educated: Patient Education method: Explanation, Demonstration, and Handouts Education comprehension: verbalized understanding and returned demonstration  HOME EXERCISE PROGRAM: Access Code: [email or web address] URL: [email or web address] Date: 03/09/2023 Prepared by: Lavinia Sharps  Exercises - Seated Cervical Flexion AROM  - 3 x daily - 7 x weekly - 1 sets - 3 reps - 20 hold - Seated Cervical Sidebending AROM  - 3 x daily - 7 x weekly - 1 sets - 3 reps - 20 hold - Seated Cervical Rotation AROM  - 3 x daily - 7 x weekly - 1 sets - 3 reps - 20 hold - Seated Correct Posture  - 1 x daily - 7 x weekly - 3 sets - 10 reps - Doorway Pec Stretch at 90 Degrees Abduction  - 3 x daily - 7 x weekly - 1 sets - 20 reps - Shoulder External Rotation with Resistance  - 2 x daily - 7 x weekly - 2 sets - 10 reps - Standing Shoulder Row with Anchored Resistance  - 1 x daily - 4 x weekly - 1-3 sets - 10 reps - Standing Shoulder Extension with Resistance  -  1 x daily - 4 x weekly - 1 sets - 10 reps - Cervical Rotation Prone on Elbows  - 2-3 x daily - 7 x weekly - 1-  sets - 10 reps - Seated Assisted Cervical Rotation with Towel  - 1 x daily - 7 x weekly - 1 sets - 10 reps - Seated Thoracic Lumbar Extension with Pectoralis Stretch  - 1 x daily - 7 x weekly - 1 sets - 10 reps  ASSESSMENT:  CLINICAL IMPRESSION: Pt continues to report Lt UE radiculopathy has been reduced by 80-90%.  Traction has helped  significantly and a home unit would be beneficial to reduce radicular symptoms, pt continues to investigate options to obtain a [employer] unit.  Pt continues to work on mobility and postural strength at home.  Pt is doing well with endurance postural  tasks and did well with advancement to green band today. FOTO is improved to 63, partially meeting goal. Patient will benefit from skilled PT to address the below impairments and improve overall function.    OBJECTIVE IMPAIRMENTS: decreased activity tolerance, decreased ROM, decreased strength, hypomobility, increased muscle spasms, impaired flexibility, postural dysfunction, and pain.   ACTIVITY LIMITATIONS: carrying, lifting, and locomotion level  PARTICIPATION LIMITATIONS: cleaning, driving, shopping, and community activity  PERSONAL FACTORS: Age and 1-2 comorbidities: chronic neck pain, C5-7 HNP  are also affecting patient's functional outcome.   REHAB POTENTIAL: Good  CLINICAL DECISION MAKING: Stable/uncomplicated  EVALUATION COMPLEXITY: Low   GOALS: Goals reviewed with patient? Yes  SHORT TERM GOALS: Target date: 03/08/2023    Be independent in initial HEP Baseline: 02/21/23 Goal status: MET  2.  Report > or = to 30% reduction in neck and Lt UE pain with daily activity  Baseline: 25% reduction (02/21/23) Goal status: met 8/8 3.  Report and demonstrate postural corrections for alignment and reduced upper trap activation Baseline: working on postural correction (02/21/23) Goal status: met 8/8    LONG TERM GOALS: Target date: 04/05/2023    Be independent in advanced HEP Baseline:  Goal status:  In progress   2.  Improve FOTO to > or = to 66 Baseline: 63 (03/23/23) Goal status: In progress   3.  Report > or = to 70% reduction in neck and Lt UE pain with daily tasks  Baseline: 50% neck 8/8 and 90% better left UE symptoms Goal status: partially met 8/8  4.  Return to regular walking program and verbalize safe progression Baseline: able to walk inconsistently without increased pain (03/01/23) Goal status: in progress   PLAN:  PT FREQUENCY: 2x/week  PT DURATION: 8 weeks  PLANNED INTERVENTIONS: Therapeutic exercises, Therapeutic activity, Neuromuscular re-education, Balance training, Gait training, Patient/Family education, Self Care, Joint mobilization, Vestibular training, Aquatic Therapy, Dry Needling, Spinal mobilization, Cryotherapy, Moist heat, Taping, Traction, Ultrasound, Ionotophoresis 4mg /ml Dexamethasone, Manual therapy, and Re-evaluation  PLAN FOR NEXT SESSION: continue traction, postural strength, DN as needed  Lorrene Reid, PT 03/23/23 10:09 AM  Phone: [phone] Fax: [phone]  [facility] [address], [address] [place], [place] [address] Phone # [phone] Fax [phone]

## 2023-03-27 ENCOUNTER — Ambulatory Visit: Payer: PPO

## 2023-03-27 DIAGNOSIS — M542 Cervicalgia: Secondary | ICD-10-CM | POA: Diagnosis not present

## 2023-03-27 DIAGNOSIS — R252 Cramp and spasm: Secondary | ICD-10-CM

## 2023-03-27 DIAGNOSIS — R293 Abnormal posture: Secondary | ICD-10-CM

## 2023-03-27 DIAGNOSIS — M6281 Muscle weakness (generalized): Secondary | ICD-10-CM

## 2023-03-27 NOTE — Therapy (Signed)
OUTPATIENT PHYSICAL THERAPY CERVICAL TREATMENT   Patient Name: Mary Graves MRN: [id number] DOB:Nov 13, 1945, 77 y.o., female Today's Date: 03/27/2023    END OF SESSION:  PT End of Session - 03/27/23 1003     Visit Number 11    Date for PT Re-Evaluation 04/05/23    Authorization Type [employer]    Progress Note Due on Visit 10    PT Start Time 0931    PT Stop Time 1017    PT Time Calculation (min) 46 min    Activity Tolerance Patient tolerated treatment well    Behavior During Therapy [facility] for tasks assessed/performed                      Past Medical History:  Diagnosis Date   Alcoholism (HCC)    Hypertension    Hypothyroidism    Pancreatitis    Past Surgical History:  Procedure Laterality Date   KNEE SURGERY Left    x 2   NECK SURGERY     bilateral facet arthrosis   TONSILLECTOMY     Patient Active Problem List   Diagnosis Date Noted   Elevated troponin 07/11/2014   Syncope 07/11/2014   PTSD (post-traumatic stress disorder) 10/31/2013   Alcoholism in family 10/31/2013   Dementia (HCC) 10/09/2013   HLD (hyperlipidemia) 02/27/2013   Osteopenia 02/27/2013   Alcohol dependence (HCC) 06/01/2012   Major depression 06/01/2012   Hypothyroidism 02/23/2009   Essential hypertension 02/23/2009   HYPERGLYCEMIA 02/23/2009    PCP: Iva Boop, MD  REFERRING PROVIDER: Venita Lick, MD  REFERRING DIAG: cervicalgia  THERAPY DIAG:  Cervicalgia  Abnormal posture  Muscle weakness (generalized)  Cramp and spasm  Rationale for Evaluation and Treatment: Rehabilitation  ONSET DATE: chronic with flare-up 01/11/23  SUBJECTIVE:                                                                                                                                                                                                         SUBJECTIVE STATEMENT: I slept wrong so my neck is stiff.   Hand dominance: Right  PERTINENT HISTORY:   Hypothyroidism, HTN, depression, PTSD  PAIN:  Are you having pain? No right now: NPRS scale: 2/10 Pain location: Lt arm and scapula Pain description: pinching, aching, stabbing  Aggravating factors: moving around  Relieving factors: Gabapentin, Tylenol, rest  PRECAUTIONS: None  WEIGHT BEARING RESTRICTIONS: No  FALLS:  Has patient fallen in last 6 months? No  LIVING ENVIRONMENT: Lives with: lives alone   OCCUPATION: retired   PLOF: Independent,  walks for exercise  PATIENT GOALS: stop taking medication, reduce pain in Lt arm, get muscles loosened up   OBJECTIVE:   DIAGNOSTIC FINDINGS:  None recent  PATIENT SURVEYS:  FOTO 57, goal is 66 03/27/23: 63   COGNITION: Overall cognitive status: Within functional limits for tasks assessed  SENSATION: WFL  POSTURE: rounded shoulders and forward head  PALPATION: Reduced and painful cervical segmental mobility, pain with side glides bilaterally.  Tension and trigger points in Rt>Lt upper traps and bil cervical paraspinals.  Atrophy in bil rhomboids   CERVICAL ROM:   Active ROM A/PROM (deg) eval A/ROM 03/01/23 A/ROM 03/23/23  Flexion full    Extension full    Right lateral flexion 40 40 40  Left lateral flexion 30 35 35  Right rotation 50 55 60  Left rotation 50 55 60   (Blank rows = not tested)  UPPER EXTREMITY ROM: WFLs in bil Ues without pain  UPPER EXTREMITY MMT: 4 to 4+/5 bil UE strength   TODAY'S TREATMENT:    03/27/23 UBE L3 fwd/bwdx 6  min reviewing status Doorway pec stretch 3x 30 sec Wall clocks: green loop 2x5 reps Rt and Lt Cervical flexibility: rotation and side bending 2x20 seconds  Seated 3 way raises: 1# added x20 Seated thoracic extension with ball hands behind neck 10x Rows with green band: 2x10, shoulder extension with green band: 2x10 Cervical mechanical traction: 17#/5# 60 seconds/10 seconds x 13 min 03/23/23 UBE L3 fwd/bwdx 6  min reviewing status Doorway pec stretch 3x 30 sec Wall  clocks: green loop 2x5 reps Rt and Lt Seated 3 way raises: 1# added x20 Seated thoracic extension with ball hands behind neck 10x Rows with green band: 2x10, shoulder extension with green band: 2x10 Cervical mechanical traction: 17#/5# 60 seconds/10 seconds x 13 min  03/20/23 UBE L3 fwd/bwdx 6  min reviewing status Doorway pec stretch 3x 30 sec Wall clocks: green loop 2x5 reps Rt and Lt Seated 3 way raises: 1# added x20 Seated thoracic extension with ball hands behind neck 10x Rows with red band: 2x10, shoulder extension with red band: 2x10 Cervical mechanical traction: 17#/5# 60 seconds/10 seconds x 15 min  PATIENT EDUCATION:  Education details: Access Code: [email or web address] Person educated: Patient Education method: Explanation, Demonstration, and Handouts Education comprehension: verbalized understanding and returned demonstration  HOME EXERCISE PROGRAM: Access Code: [email or web address] URL: [email or web address] Date: 03/09/2023 Prepared by: Lavinia Sharps  Exercises - Seated Cervical Flexion AROM  - 3 x daily - 7 x weekly - 1 sets - 3 reps - 20 hold - Seated Cervical Sidebending AROM  - 3 x daily - 7 x weekly - 1 sets - 3 reps - 20 hold - Seated Cervical Rotation AROM  - 3 x daily - 7 x weekly - 1 sets - 3 reps - 20 hold - Seated Correct Posture  - 1 x daily - 7 x weekly - 3 sets - 10 reps - Doorway Pec Stretch at 90 Degrees Abduction  - 3 x daily - 7 x weekly - 1 sets - 20 reps - Shoulder External Rotation with Resistance  - 2 x daily - 7 x weekly - 2 sets - 10 reps - Standing Shoulder Row with Anchored Resistance  - 1 x daily - 4 x weekly - 1-3 sets - 10 reps - Standing Shoulder Extension with Resistance  - 1 x daily - 4 x weekly - 1 sets - 10 reps - Cervical Rotation Prone on Elbows  - 2-3 x daily -  7 x weekly - 1- sets - 10 reps - Seated Assisted Cervical Rotation with Towel  - 1 x daily - 7 x weekly - 1 sets - 10 reps - Seated Thoracic Lumbar Extension with Pectoralis Stretch   - 1 x daily - 7 x weekly - 1 sets - 10 reps  ASSESSMENT:  CLINICAL IMPRESSION: Pt reports some neck tension today due to not sleeping well. Traction has helped her radicular symptoms significantly and a home unit would be beneficial to reduce radicular symptoms, pt continues to investigate options to obtain a Sa[employer]'s unit.  Pt continues to work on mobility and postural strength at home.  Few verbal cues required for postural alignment.  FOTO was improved to 63, partially meeting goal las week. Patient will benefit from skilled PT to address the below impairments and improve overall function.    OBJECTIVE IMPAIRMENTS: decreased activity tolerance, decreased ROM, decreased strength, hypomobility, increased muscle spasms, impaired flexibility, postural dysfunction, and pain.   ACTIVITY LIMITATIONS: carrying, lifting, and locomotion level  PARTICIPATION LIMITATIONS: cleaning, driving, shopping, and community activity  PERSONAL FACTORS: Age and 1-2 comorbidities: chronic neck pain, C5-7 HNP  are also affecting patient's functional outcome.   REHAB POTENTIAL: Good  CLINICAL DECISION MAKING: Stable/uncomplicated  EVALUATION COMPLEXITY: Low   GOALS: Goals reviewed with patient? Yes  SHORT TERM GOALS: Target date: 03/08/2023    Be independent in initial HEP Baseline: 02/21/23 Goal status: MET  2.  Report > or = to 30% reduction in neck and Lt UE pain with daily activity  Baseline: 25% reduction (02/21/23) Goal status: met 8/8 3.  Report and demonstrate postural corrections for alignment and reduced upper trap activation Baseline: working on postural correction (02/21/23) Goal status: met 8/8    LONG TERM GOALS: Target date: 04/05/2023    Be independent in advanced HEP Baseline:  Goal status: In progress   2.  Improve FOTO to > or = to 66 Baseline: 63 (03/23/23) Goal status: In progress   3.  Report > or = to 70% reduction in neck and Lt UE pain with daily tasks  Baseline: 50%  neck 8/8 and 90% better left UE symptoms Goal status: partially met 8/8  4.  Return to regular walking program and verbalize safe progression Baseline: able to walk inconsistently without increased pain (03/01/23) Goal status: in progress   PLAN:  PT FREQUENCY: 2x/week  PT DURATION: 8 weeks  PLANNED INTERVENTIONS: Therapeutic exercises, Therapeutic activity, Neuromuscular re-education, Balance training, Gait training, Patient/Family education, Self Care, Joint mobilization, Vestibular training, Aquatic Therapy, Dry Needling, Spinal mobilization, Cryotherapy, Moist heat, Taping, Traction, Ultrasound, Ionotophoresis 4mg /ml Dexamethasone, Manual therapy, and Re-evaluation  PLAN FOR NEXT SESSION: continue traction, postural strength, DN as needed. Probable D/C soon  Lorrene Reid, PT 03/27/23 10:04 AM  Phone: [phone] Fax: [phone]  [facility] [address] [place], [place] [address] Phone # [phone] Fax [phone]

## 2023-03-29 ENCOUNTER — Ambulatory Visit: Payer: PPO

## 2023-03-29 DIAGNOSIS — M542 Cervicalgia: Secondary | ICD-10-CM | POA: Diagnosis not present

## 2023-03-29 DIAGNOSIS — R293 Abnormal posture: Secondary | ICD-10-CM

## 2023-03-29 DIAGNOSIS — M6281 Muscle weakness (generalized): Secondary | ICD-10-CM

## 2023-03-29 DIAGNOSIS — R252 Cramp and spasm: Secondary | ICD-10-CM

## 2023-03-29 NOTE — Therapy (Signed)
OUTPATIENT PHYSICAL THERAPY CERVICAL TREATMENT   Patient Name: Mary Graves MRN: [id number] DOB:[birth year], 77 y.o., female Today's Date: 03/29/2023    END OF SESSION:  PT End of Session - 03/29/23 0948     Visit Number 12    Authorization Type [employer]    PT Start Time 0932    PT Stop Time 1015    PT Time Calculation (min) 43 min    Activity Tolerance Patient tolerated treatment well    Behavior During Therapy [facility] for tasks assessed/performed                       Past Medical History:  Diagnosis Date   Alcoholism (HCC)    Hypertension    Hypothyroidism    Pancreatitis    Past Surgical History:  Procedure Laterality Date   KNEE SURGERY Left    x 2   NECK SURGERY     bilateral facet arthrosis   TONSILLECTOMY     Patient Active Problem List   Diagnosis Date Noted   Elevated troponin 07/11/2014   Syncope 07/11/2014   PTSD (post-traumatic stress disorder) 10/31/2013   Alcoholism in family 10/31/2013   Dementia (HCC) 10/09/2013   HLD (hyperlipidemia) 02/27/2013   Osteopenia 02/27/2013   Alcohol dependence (HCC) 06/01/2012   Major depression 06/01/2012   Hypothyroidism 02/23/2009   Essential hypertension 02/23/2009   HYPERGLYCEMIA 02/23/2009    PCP: Iva Boop, MD  REFERRING PROVIDER: Venita Lick, MD  REFERRING DIAG: cervicalgia  THERAPY DIAG:  Cervicalgia  Abnormal posture  Muscle weakness (generalized)  Cramp and spasm  Rationale for Evaluation and Treatment: Rehabilitation  ONSET DATE: chronic with flare-up 01/11/23  SUBJECTIVE:                                                                                                                                                                                                         SUBJECTIVE STATEMENT: My neck is stiff and sore at times.  Lt arm pain is completely gone now.  Traction is helping.  Still working on getting a home unit.  Ready to D/C to HEP.    Hand  dominance: Right  PERTINENT HISTORY:  Hypothyroidism, HTN, depression, PTSD  PAIN:  Are you having pain? No right now: NPRS scale: 2/10 Pain location: Lt arm and scapula Pain description: pinching, aching, stabbing  Aggravating factors: moving around  Relieving factors: Gabapentin, Tylenol, rest  PRECAUTIONS: None  WEIGHT BEARING RESTRICTIONS: No  FALLS:  Has patient fallen in last 6 months? No  LIVING ENVIRONMENT: Lives  with: lives alone   OCCUPATION: retired   PLOF: Independent, walks for exercise  PATIENT GOALS: stop taking medication, reduce pain in Lt arm, get muscles loosened up   OBJECTIVE:   DIAGNOSTIC FINDINGS:  None recent  PATIENT SURVEYS:  FOTO 57, goal is 40 03/27/23: 63   COGNITION: Overall cognitive status: Within functional limits for tasks assessed  SENSATION: WFL  POSTURE: rounded shoulders and forward head  PALPATION: Reduced and painful cervical segmental mobility, pain with side glides bilaterally.  Tension and trigger points in Rt>Lt upper traps and bil cervical paraspinals.  Atrophy in bil rhomboids   CERVICAL ROM:   Active ROM A/PROM (deg) eval A/ROM 03/01/23 A/ROM 03/23/23  Flexion full    Extension full    Right lateral flexion 40 40 40  Left lateral flexion 30 35 35  Right rotation 50 55 60  Left rotation 50 55 60   (Blank rows = not tested)  UPPER EXTREMITY ROM: WFLs in bil Ues without pain  UPPER EXTREMITY MMT: 4 to 4+/5 bil UE strength   TODAY'S TREATMENT:  03/29/23 UBE L3 fwd/bwdx 6  min reviewing status Wall clocks: green loop 2x5 reps Rt and Lt Cervical flexibility: rotation and side bending 2x20 seconds  Seated 3 way raises: 1# added x20 Seated thoracic extension with ball hands behind neck 10x Cervical mechanical traction: 17#/5# 60 seconds/10 seconds x 13 min   03/27/23 UBE L3 fwd/bwdx 6  min reviewing status Doorway pec stretch 3x 30 sec Wall clocks: green loop 2x5 reps Rt and Lt Cervical flexibility:  rotation and side bending 2x20 seconds  Seated 3 way raises: 1# added x20 Seated thoracic extension with ball hands behind neck 10x Rows with green band: 2x10, shoulder extension with green band: 2x10 Cervical mechanical traction: 17#/5# 60 seconds/10 seconds x 13 min 03/23/23 UBE L3 fwd/bwdx 6  min reviewing status Doorway pec stretch 3x 30 sec Wall clocks: green loop 2x5 reps Rt and Lt Seated 3 way raises: 1# added x20 Seated thoracic extension with ball hands behind neck 10x Rows with green band: 2x10, shoulder extension with green band: 2x10 Cervical mechanical traction: 17#/5# 60 seconds/10 seconds x 13 min   PATIENT EDUCATION:  Education details: Access Code: [email or web address] Person educated: Patient Education method: Explanation, Demonstration, and Handouts Education comprehension: verbalized understanding and returned demonstration  HOME EXERCISE PROGRAM: Access Code: [email or web address] URL: [email or web address] Date: 03/09/2023 Prepared by: Lavinia Sharps  Exercises - Seated Cervical Flexion AROM  - 3 x daily - 7 x weekly - 1 sets - 3 reps - 20 hold - Seated Cervical Sidebending AROM  - 3 x daily - 7 x weekly - 1 sets - 3 reps - 20 hold - Seated Cervical Rotation AROM  - 3 x daily - 7 x weekly - 1 sets - 3 reps - 20 hold - Seated Correct Posture  - 1 x daily - 7 x weekly - 3 sets - 10 reps - Doorway Pec Stretch at 90 Degrees Abduction  - 3 x daily - 7 x weekly - 1 sets - 20 reps - Shoulder External Rotation with Resistance  - 2 x daily - 7 x weekly - 2 sets - 10 reps - Standing Shoulder Row with Anchored Resistance  - 1 x daily - 4 x weekly - 1-3 sets - 10 reps - Standing Shoulder Extension with Resistance  - 1 x daily - 4 x weekly - 1 sets - 10 reps - Cervical Rotation Prone on Elbows  - 2-3  x daily - 7 x weekly - 1- sets - 10 reps - Seated Assisted Cervical Rotation with Towel  - 1 x daily - 7 x weekly - 1 sets - 10 reps - Seated Thoracic Lumbar Extension with Pectoralis  Stretch  - 1 x daily - 7 x weekly - 1 sets - 10 reps  ASSESSMENT:  CLINICAL IMPRESSION: Pt reports 80% reduction in neck pain and 100% reduction in Lt arm pain.  She is able to walk without arm pain. Pt continues to work on mobility and postural strength at home.  Few verbal cues required for postural alignment.  FOTO was improved to 63, partially meeting goal last week. Pt will D/C to HEP today   OBJECTIVE IMPAIRMENTS: decreased activity tolerance, decreased ROM, decreased strength, hypomobility, increased muscle spasms, impaired flexibility, postural dysfunction, and pain.   ACTIVITY LIMITATIONS: carrying, lifting, and locomotion level  PARTICIPATION LIMITATIONS: cleaning, driving, shopping, and community activity  PERSONAL FACTORS: Age and 1-2 comorbidities: chronic neck pain, C5-7 HNP  are also affecting patient's functional outcome.   REHAB POTENTIAL: Good  CLINICAL DECISION MAKING: Stable/uncomplicated  EVALUATION COMPLEXITY: Low   GOALS: Goals reviewed with patient? Yes  SHORT TERM GOALS: Target date: 03/08/2023    Be independent in initial HEP Baseline: 02/21/23 Goal status: MET  2.  Report > or = to 30% reduction in neck and Lt UE pain with daily activity  Baseline: 25% reduction (02/21/23) Goal status: met 8/8 3.  Report and demonstrate postural corrections for alignment and reduced upper trap activation Baseline: working on postural correction (02/21/23) Goal status: met 8/8    LONG TERM GOALS: Target date: 04/05/2023    Be independent in advanced HEP Baseline:  Goal status: MET  2.  Improve FOTO to > or = to 66 Baseline: 63 (03/23/23) Goal status: partially met  3.  Report > or = to 70% reduction in neck and Lt UE pain with daily tasks  Baseline: 80% neck, 100% arm (03/29/23) Goal status: MET  4.  Return to regular walking program and verbalize safe progression Baseline: no limitation with walking due to neck (03/29/23) Goal status:  MET   PLAN:  PHYSICAL THERAPY DISCHARGE SUMMARY  Visits from Start of Care: 12  Current functional level related to goals / functional outcomes: Pt has met all goals and will discharge to HEP today.     Remaining deficits: Neck stiffness/soreness   Education / Equipment: HEP, posture, home traction use   Patient agrees to discharge. Patient goals were met. Patient is being discharged due to meeting the stated rehab goals.   Lorrene Reid, PT 03/29/23 10:00 AM   [facility] [address] [place], [place] [address] Phone # [phone] Fax [phone]

## 2023-03-30 ENCOUNTER — Ambulatory Visit: Payer: PPO

## 2023-04-24 ENCOUNTER — Other Ambulatory Visit: Payer: Self-pay | Admitting: Family Medicine

## 2023-04-24 DIAGNOSIS — Z0000 Encounter for general adult medical examination without abnormal findings: Secondary | ICD-10-CM

## 2023-05-01 DIAGNOSIS — M810 Age-related osteoporosis without current pathological fracture: Secondary | ICD-10-CM | POA: Diagnosis not present

## 2023-05-22 DIAGNOSIS — M810 Age-related osteoporosis without current pathological fracture: Secondary | ICD-10-CM | POA: Diagnosis not present

## 2023-05-22 DIAGNOSIS — M549 Dorsalgia, unspecified: Secondary | ICD-10-CM | POA: Diagnosis not present

## 2023-05-22 DIAGNOSIS — Z6821 Body mass index (BMI) 21.0-21.9, adult: Secondary | ICD-10-CM | POA: Diagnosis not present

## 2023-06-05 ENCOUNTER — Ambulatory Visit
Admission: RE | Admit: 2023-06-05 | Discharge: 2023-06-05 | Disposition: A | Discharge status: Home / Self Care | Payer: PPO | Source: Ambulatory Visit | Attending: Family Medicine | Admitting: Family Medicine

## 2023-06-05 DIAGNOSIS — Z0000 Encounter for general adult medical examination without abnormal findings: Secondary | ICD-10-CM

## 2023-06-05 DIAGNOSIS — Z1231 Encounter for screening mammogram for malignant neoplasm of breast: Secondary | ICD-10-CM | POA: Diagnosis not present

## 2023-06-05 HISTORY — DX: Malignant neoplasm of uvula: C05.2

## 2023-06-14 DIAGNOSIS — M5412 Radiculopathy, cervical region: Secondary | ICD-10-CM | POA: Diagnosis not present

## 2023-07-07 ENCOUNTER — Ambulatory Visit
Admission: RE | Admit: 2023-07-07 | Discharge: 2023-07-07 | Disposition: A | Discharge status: Home / Self Care | Payer: PPO | Source: Ambulatory Visit | Attending: Family Medicine | Admitting: Family Medicine

## 2023-07-07 DIAGNOSIS — Z13820 Encounter for screening for osteoporosis: Secondary | ICD-10-CM

## 2023-07-07 DIAGNOSIS — E2839 Other primary ovarian failure: Secondary | ICD-10-CM | POA: Diagnosis not present

## 2023-07-07 DIAGNOSIS — N958 Other specified menopausal and perimenopausal disorders: Secondary | ICD-10-CM | POA: Diagnosis not present

## 2023-07-07 DIAGNOSIS — M8588 Other specified disorders of bone density and structure, other site: Secondary | ICD-10-CM | POA: Diagnosis not present

## 2023-07-13 DIAGNOSIS — H52203 Unspecified astigmatism, bilateral: Secondary | ICD-10-CM | POA: Diagnosis not present

## 2023-07-13 DIAGNOSIS — H40053 Ocular hypertension, bilateral: Secondary | ICD-10-CM | POA: Diagnosis not present

## 2023-07-13 DIAGNOSIS — Z961 Presence of intraocular lens: Secondary | ICD-10-CM | POA: Diagnosis not present

## 2023-09-20 DIAGNOSIS — I10 Essential (primary) hypertension: Secondary | ICD-10-CM | POA: Diagnosis not present

## 2023-09-20 DIAGNOSIS — N1832 Chronic kidney disease, stage 3b: Secondary | ICD-10-CM | POA: Diagnosis not present

## 2023-09-20 DIAGNOSIS — E039 Hypothyroidism, unspecified: Secondary | ICD-10-CM | POA: Diagnosis not present

## 2023-09-21 ENCOUNTER — Other Ambulatory Visit: Payer: PPO

## 2023-10-30 DIAGNOSIS — M810 Age-related osteoporosis without current pathological fracture: Secondary | ICD-10-CM | POA: Diagnosis not present

## 2024-01-05 DIAGNOSIS — I10 Essential (primary) hypertension: Secondary | ICD-10-CM | POA: Diagnosis not present

## 2024-01-05 DIAGNOSIS — N1832 Chronic kidney disease, stage 3b: Secondary | ICD-10-CM | POA: Diagnosis not present

## 2024-01-29 DIAGNOSIS — E039 Hypothyroidism, unspecified: Secondary | ICD-10-CM | POA: Diagnosis not present

## 2024-01-29 DIAGNOSIS — F331 Major depressive disorder, recurrent, moderate: Secondary | ICD-10-CM | POA: Diagnosis not present

## 2024-01-29 DIAGNOSIS — I10 Essential (primary) hypertension: Secondary | ICD-10-CM | POA: Diagnosis not present

## 2024-01-29 DIAGNOSIS — N1832 Chronic kidney disease, stage 3b: Secondary | ICD-10-CM | POA: Diagnosis not present

## 2024-01-29 DIAGNOSIS — E785 Hyperlipidemia, unspecified: Secondary | ICD-10-CM | POA: Diagnosis not present

## 2024-02-01 DIAGNOSIS — M25562 Pain in left knee: Secondary | ICD-10-CM | POA: Diagnosis not present

## 2024-02-01 DIAGNOSIS — M1712 Unilateral primary osteoarthritis, left knee: Secondary | ICD-10-CM | POA: Diagnosis not present

## 2024-02-03 DIAGNOSIS — I10 Essential (primary) hypertension: Secondary | ICD-10-CM | POA: Diagnosis not present

## 2024-02-03 DIAGNOSIS — N1832 Chronic kidney disease, stage 3b: Secondary | ICD-10-CM | POA: Diagnosis not present

## 2024-02-25 DIAGNOSIS — M1712 Unilateral primary osteoarthritis, left knee: Secondary | ICD-10-CM | POA: Diagnosis not present

## 2024-02-29 DIAGNOSIS — F331 Major depressive disorder, recurrent, moderate: Secondary | ICD-10-CM | POA: Diagnosis not present

## 2024-02-29 DIAGNOSIS — E039 Hypothyroidism, unspecified: Secondary | ICD-10-CM | POA: Diagnosis not present

## 2024-02-29 DIAGNOSIS — E785 Hyperlipidemia, unspecified: Secondary | ICD-10-CM | POA: Diagnosis not present

## 2024-02-29 DIAGNOSIS — N1832 Chronic kidney disease, stage 3b: Secondary | ICD-10-CM | POA: Diagnosis not present

## 2024-02-29 DIAGNOSIS — I10 Essential (primary) hypertension: Secondary | ICD-10-CM | POA: Diagnosis not present

## 2024-03-04 DIAGNOSIS — N1832 Chronic kidney disease, stage 3b: Secondary | ICD-10-CM | POA: Diagnosis not present

## 2024-03-04 DIAGNOSIS — I10 Essential (primary) hypertension: Secondary | ICD-10-CM | POA: Diagnosis not present

## 2024-03-06 DIAGNOSIS — M1712 Unilateral primary osteoarthritis, left knee: Secondary | ICD-10-CM | POA: Diagnosis not present

## 2024-03-18 DIAGNOSIS — E039 Hypothyroidism, unspecified: Secondary | ICD-10-CM | POA: Diagnosis not present

## 2024-03-18 DIAGNOSIS — E785 Hyperlipidemia, unspecified: Secondary | ICD-10-CM | POA: Diagnosis not present

## 2024-03-18 DIAGNOSIS — N1832 Chronic kidney disease, stage 3b: Secondary | ICD-10-CM | POA: Diagnosis not present

## 2024-03-18 DIAGNOSIS — Z0000 Encounter for general adult medical examination without abnormal findings: Secondary | ICD-10-CM | POA: Diagnosis not present

## 2024-03-26 DIAGNOSIS — E785 Hyperlipidemia, unspecified: Secondary | ICD-10-CM | POA: Diagnosis not present

## 2024-03-26 DIAGNOSIS — M1712 Unilateral primary osteoarthritis, left knee: Secondary | ICD-10-CM | POA: Diagnosis not present

## 2024-03-26 DIAGNOSIS — N1832 Chronic kidney disease, stage 3b: Secondary | ICD-10-CM | POA: Diagnosis not present

## 2024-03-26 DIAGNOSIS — I10 Essential (primary) hypertension: Secondary | ICD-10-CM | POA: Diagnosis not present

## 2024-03-26 DIAGNOSIS — E559 Vitamin D deficiency, unspecified: Secondary | ICD-10-CM | POA: Diagnosis not present

## 2024-03-26 DIAGNOSIS — M810 Age-related osteoporosis without current pathological fracture: Secondary | ICD-10-CM | POA: Diagnosis not present

## 2024-03-26 DIAGNOSIS — Z6820 Body mass index (BMI) 20.0-20.9, adult: Secondary | ICD-10-CM | POA: Diagnosis not present

## 2024-03-26 DIAGNOSIS — E039 Hypothyroidism, unspecified: Secondary | ICD-10-CM | POA: Diagnosis not present

## 2024-03-26 DIAGNOSIS — M5412 Radiculopathy, cervical region: Secondary | ICD-10-CM | POA: Diagnosis not present

## 2024-03-26 DIAGNOSIS — R0602 Shortness of breath: Secondary | ICD-10-CM | POA: Diagnosis not present

## 2024-03-27 DIAGNOSIS — C051 Malignant neoplasm of soft palate: Secondary | ICD-10-CM | POA: Diagnosis not present

## 2024-03-31 DIAGNOSIS — E039 Hypothyroidism, unspecified: Secondary | ICD-10-CM | POA: Diagnosis not present

## 2024-03-31 DIAGNOSIS — F331 Major depressive disorder, recurrent, moderate: Secondary | ICD-10-CM | POA: Diagnosis not present

## 2024-03-31 DIAGNOSIS — E785 Hyperlipidemia, unspecified: Secondary | ICD-10-CM | POA: Diagnosis not present

## 2024-03-31 DIAGNOSIS — N1832 Chronic kidney disease, stage 3b: Secondary | ICD-10-CM | POA: Diagnosis not present

## 2024-03-31 DIAGNOSIS — I10 Essential (primary) hypertension: Secondary | ICD-10-CM | POA: Diagnosis not present

## 2024-04-03 DIAGNOSIS — N1832 Chronic kidney disease, stage 3b: Secondary | ICD-10-CM | POA: Diagnosis not present

## 2024-04-03 DIAGNOSIS — I10 Essential (primary) hypertension: Secondary | ICD-10-CM | POA: Diagnosis not present

## 2024-04-22 ENCOUNTER — Other Ambulatory Visit: Payer: Self-pay | Admitting: Family Medicine

## 2024-04-22 DIAGNOSIS — Z1231 Encounter for screening mammogram for malignant neoplasm of breast: Secondary | ICD-10-CM

## 2024-04-30 DIAGNOSIS — I10 Essential (primary) hypertension: Secondary | ICD-10-CM | POA: Diagnosis not present

## 2024-04-30 DIAGNOSIS — E785 Hyperlipidemia, unspecified: Secondary | ICD-10-CM | POA: Diagnosis not present

## 2024-04-30 DIAGNOSIS — M1712 Unilateral primary osteoarthritis, left knee: Secondary | ICD-10-CM | POA: Diagnosis not present

## 2024-04-30 DIAGNOSIS — F331 Major depressive disorder, recurrent, moderate: Secondary | ICD-10-CM | POA: Diagnosis not present

## 2024-04-30 DIAGNOSIS — M25562 Pain in left knee: Secondary | ICD-10-CM | POA: Diagnosis not present

## 2024-04-30 DIAGNOSIS — E039 Hypothyroidism, unspecified: Secondary | ICD-10-CM | POA: Diagnosis not present

## 2024-04-30 DIAGNOSIS — N1832 Chronic kidney disease, stage 3b: Secondary | ICD-10-CM | POA: Diagnosis not present

## 2024-05-01 DIAGNOSIS — M810 Age-related osteoporosis without current pathological fracture: Secondary | ICD-10-CM | POA: Diagnosis not present

## 2024-05-03 DIAGNOSIS — N1832 Chronic kidney disease, stage 3b: Secondary | ICD-10-CM | POA: Diagnosis not present

## 2024-05-03 DIAGNOSIS — I10 Essential (primary) hypertension: Secondary | ICD-10-CM | POA: Diagnosis not present

## 2024-05-06 DIAGNOSIS — Z23 Encounter for immunization: Secondary | ICD-10-CM | POA: Diagnosis not present

## 2024-05-21 NOTE — Progress Notes (Signed)
 COVID Vaccine Completed: yes  Date of COVID positive in last 90 days:  PCP - Sari Cable, MD Cardiologist -   Chest x-ray - N/A EKG - 05/22/24 Epic Stress Test - N/A ECHO - 07/11/14 epic Cardiac Cath - n/a Pacemaker/ICD device last checked:N/A Spinal Cord Stimulator:N/A  Bowel Prep - N/A  Sleep Study - N/A CPAP -   Fasting Blood Sugar - N/A Checks Blood Sugar _____ times a day  Last dose of GLP1 agonist-  N/A GLP1 instructions:  Do not take after     Last dose of SGLT-2 inhibitors-  N/A SGLT-2 instructions:  Do not take after     Blood Thinner Instructions: N/A Last dose:   Time: Aspirin  Instructions:N/A Last Dose:  Activity level: Can go up a flight of stairs and perform activities of daily living without stopping and without symptoms of chest pain or shortness of breath.  Anesthesia review: Heart Murmur, HTN, 1st degree AV block on EKG  Patient denies shortness of breath, fever, cough and chest pain at PAT appointment  Patient verbalized understanding of instructions that were given to them at the PAT appointment. Patient was also instructed that they will need to review over the PAT instructions again at home before surgery.

## 2024-05-21 NOTE — Patient Instructions (Signed)
 SURGICAL WAITING ROOM VISITATION  Patients having surgery or a procedure may have no more than 2 support people in the waiting area - these visitors may rotate.    Children under the age of 3 must have an adult with them who is not the patient.  Visitors with respiratory illnesses are discouraged from visiting and should remain at home.  If the patient needs to stay at the hospital during part of their recovery, the visitor guidelines for inpatient rooms apply. Pre-op nurse will coordinate an appropriate time for 1 support person to accompany patient in pre-op.  This support person may not rotate.    Please refer to the [facility] website for the visitor guidelines for Inpatients (after your surgery is over and you are in a regular room).    Your procedure is scheduled on: 06/04/24   Report to [facility] Main Entrance    Report to admitting at 11:50 AM   Call this number if you have problems the morning of surgery [phone]   Do not eat food :After Midnight.   After Midnight you may have the following liquids until 11:20 AM DAY OF SURGERY  Water Non-Citrus Juices (without pulp, NO RED-Apple, White grape, White cranberry) Black Coffee (NO MILK/CREAM OR CREAMERS, sugar ok)  Clear Tea (NO MILK/CREAM OR CREAMERS, sugar ok) regular and decaf                             Plain Jell-O (NO RED)                                           Fruit ices (not with fruit pulp, NO RED)                                     Popsicles (NO RED)                                                               Sports drinks like Gatorade (NO RED)   The day of surgery:  Drink ONE (1) Pre-Surgery Clear Ensure at 11:20 AM the morning of surgery. Drink in one sitting. Do not sip.  This drink was given to you during your hospital  pre-op appointment visit. Nothing else to drink after completing the  Pre-Surgery Clear Ensure.          If you have questions, please contact your surgeon's  office.   FOLLOW BOWEL PREP AND ANY ADDITIONAL PRE OP INSTRUCTIONS YOU RECEIVED FROM YOUR SURGEON'S OFFICE!!!     Oral Hygiene is also important to reduce your risk of infection.                                    Remember - BRUSH YOUR TEETH THE MORNING OF SURGERY WITH YOUR REGULAR TOOTHPASTE  DENTURES WILL BE REMOVED PRIOR TO SURGERY PLEASE DO NOT APPLY Poly grip OR ADHESIVES!!!   Stop all vitamins and herbal supplements 7 days before surgery.  Take these medicines the morning of surgery with A SIP OF WATER: Levothyroxine                                You may not have any metal on your body including hair pins, jewelry, and body piercing             Do not wear make-up, lotions, powders, perfumes/cologne, or deodorant  Do not wear nail polish including gel and S&S, artificial/acrylic nails, or any other type of covering on natural nails including finger and toenails. If you have artificial nails, gel coating, etc. that needs to be removed by a nail salon please have this removed prior to surgery or surgery may need to be canceled/ delayed if the surgeon/ anesthesia feels like they are unable to be safely monitored.   Do not shave  48 hours prior to surgery.    Do not bring valuables to the hospital. [facility] IS NOT             RESPONSIBLE   FOR VALUABLES.   Contacts, glasses, dentures or bridgework may not be worn into surgery.   Bring small overnight bag day of surgery.   DO NOT BRING YOUR HOME MEDICATIONS TO THE HOSPITAL. PHARMACY WILL DISPENSE MEDICATIONS LISTED ON YOUR MEDICATION LIST TO YOU DURING YOUR ADMISSION IN THE HOSPITAL!   Special Instructions: Bring a copy of your healthcare power of attorney and living will documents the day of surgery if you haven't scanned them before.              Please read over the following fact sheets you were given: IF YOU HAVE QUESTIONS ABOUT YOUR PRE-OP INSTRUCTIONS PLEASE CALL [phone].   If you received a COVID test  during your pre-op visit  it is requested that you wear a mask when out in public, stay away from anyone that may not be feeling well and notify your surgeon if you develop symptoms. If you test positive for Covid or have been in contact with anyone that has tested positive in the last 10 days please notify you surgeon.      Pre-operative 4 CHG Bath Instructions  DYNA-Hex 4 Chlorhexidine Gluconate 4% Solution Antiseptic 4 fl. oz   You can play a key role in reducing the risk of infection after surgery. Your skin needs to be as free of germs as possible. You can reduce the number of germs on your skin by washing with CHG (chlorhexidine gluconate) soap before surgery. CHG is an antiseptic soap that kills germs and continues to kill germs even after washing.   DO NOT use if you have an allergy  to chlorhexidine/CHG or antibacterial soaps. If your skin becomes reddened or irritated, stop using the CHG and notify one of our RNs at   Please shower with the CHG soap starting 4 days before surgery using the following schedule:     Please keep in mind the following:  DO NOT shave, including legs and underarms, starting the day of your first shower.   You may shave your face at any point before/day of surgery.  Place clean sheets on your bed the day you start using CHG soap. Use a clean washcloth (not used since being washed) for each shower. DO NOT sleep with pets once you start using the CHG.  CHG Shower Instructions:  If you choose to wash your hair and private area, wash first with  your normal shampoo/soap.  After you use shampoo/soap, rinse your hair and body thoroughly to remove shampoo/soap residue.  Turn the water OFF and apply about 3 tablespoons (45 ml) of CHG soap to a CLEAN washcloth.  Apply CHG soap ONLY FROM YOUR NECK DOWN TO YOUR TOES (washing for 3-5 minutes)  DO NOT use CHG soap on face, private areas, open wounds, or sores.  Pay special attention to the area where your surgery is  being performed.  If you are having back surgery, having someone wash your back for you may be helpful. Wait 2 minutes after CHG soap is applied, then you may rinse off the CHG soap.  Pat dry with a clean towel  Put on clean clothes/pajamas   If you choose to wear lotion, please use ONLY the CHG-compatible lotions on the back of this paper.     Additional instructions for the day of surgery: DO NOT APPLY any lotions, deodorants, cologne, or perfumes.   Put on clean/comfortable clothes.  Brush your teeth.  Ask your nurse before applying any prescription medications to the skin.   CHG Compatible Lotions   Aveeno Moisturizing lotion  Cetaphil Moisturizing Cream  Cetaphil Moisturizing Lotion  Clairol Herbal Essence Moisturizing Lotion, Dry Skin  Clairol Herbal Essence Moisturizing Lotion, Extra Dry Skin  Clairol Herbal Essence Moisturizing Lotion, Normal Skin  Curel Age Defying Therapeutic Moisturizing Lotion with Alpha Hydroxy  Curel Extreme Care Body Lotion  Curel Soothing Hands Moisturizing Hand Lotion  Curel Therapeutic Moisturizing Cream, Fragrance-Free  Curel Therapeutic Moisturizing Lotion, Fragrance-Free  Curel Therapeutic Moisturizing Lotion, Original Formula  Eucerin Daily Replenishing Lotion  Eucerin Dry Skin Therapy Plus Alpha Hydroxy Crme  Eucerin Dry Skin Therapy Plus Alpha Hydroxy Lotion  Eucerin Original Crme  Eucerin Original Lotion  Eucerin Plus Crme Eucerin Plus Lotion  Eucerin TriLipid Replenishing Lotion  Keri Anti-Bacterial Hand Lotion  Keri Deep Conditioning Original Lotion Dry Skin Formula Softly Scented  Keri Deep Conditioning Original Lotion, Fragrance Free Sensitive Skin Formula  Keri Lotion Fast Absorbing Fragrance Free Sensitive Skin Formula  Keri Lotion Fast Absorbing Softly Scented Dry Skin Formula  Keri Original Lotion  Keri Skin Renewal Lotion Keri Silky Smooth Lotion  Keri Silky Smooth Sensitive Skin Lotion  Nivea Body Creamy Conditioning  Oil  Nivea Body Extra Enriched Lotion  Nivea Body Original Lotion  Nivea Body Sheer Moisturizing Lotion Nivea Crme  Nivea Skin Firming Lotion  NutraDerm 30 Skin Lotion  NutraDerm Skin Lotion  NutraDerm Therapeutic Skin Cream  NutraDerm Therapeutic Skin Lotion  ProShield Protective Hand Cream  Provon moisturizing lotionView Pre-Surgery Education Videos:  [email or web address]     Incentive Spirometer  An incentive spirometer is a tool that can help keep your lungs clear and active. This tool measures how well you are filling your lungs with each breath. Taking long deep breaths may help reverse or decrease the chance of developing breathing (pulmonary) problems (especially infection) following: A long period of time when you are unable to move or be active. BEFORE THE PROCEDURE  If the spirometer includes an indicator to show your best effort, your nurse or respiratory therapist will set it to a desired goal. If possible, sit up straight or lean slightly forward. Try not to slouch. Hold the incentive spirometer in an upright position. INSTRUCTIONS FOR USE  Sit on the edge of your bed if possible, or sit up as far as you can in bed or on a chair. Hold the incentive spirometer in an upright position. Breathe out  normally. Place the mouthpiece in your mouth and seal your lips tightly around it. Breathe in slowly and as deeply as possible, raising the piston or the ball toward the top of the column. Hold your breath for 3-5 seconds or for as long as possible. Allow the piston or ball to fall to the bottom of the column. Remove the mouthpiece from your mouth and breathe out normally. Rest for a few seconds and repeat Steps 1 through 7 at least 10 times every 1-2 hours when you are awake. Take your time and take a few normal breaths between deep breaths. The spirometer may include an indicator to show your best effort. Use the  indicator as a goal to work toward during each repetition. After each set of 10 deep breaths, practice coughing to be sure your lungs are clear. If you have an incision (the cut made at the time of surgery), support your incision when coughing by placing a pillow or rolled up towels firmly against it. Once you are able to get out of bed, walk around indoors and cough well. You may stop using the incentive spirometer when instructed by your caregiver.  RISKS AND COMPLICATIONS Take your time so you do not get dizzy or light-headed. If you are in pain, you may need to take or ask for pain medication before doing incentive spirometry. It is harder to take a deep breath if you are having pain. AFTER USE Rest and breathe slowly and easily. It can be helpful to keep track of a log of your progress. Your caregiver can provide you with a simple table to help with this. If you are using the spirometer at home, follow these instructions: SEEK MEDICAL CARE IF:  You are having difficultly using the spirometer. You have trouble using the spirometer as often as instructed. Your pain medication is not giving enough relief while using the spirometer. You develop fever of 100.5 F (38.1 C) or higher. SEEK IMMEDIATE MEDICAL CARE IF:  You cough up bloody sputum that had not been present before. You develop fever of 102 F (38.9 C) or greater. You develop worsening pain at or near the incision site. MAKE SURE YOU:  Understand these instructions. Will watch your condition. Will get help right away if you are not doing well or get worse. Document Released: 11/28/2006 Document Revised: 10/10/2011 Document Reviewed: 01/29/2007 [facility] Patient Information 2014 [place], [place].   ________________________________________________________________________

## 2024-05-22 ENCOUNTER — Encounter (HOSPITAL_COMMUNITY)
Admission: RE | Admit: 2024-05-22 | Discharge: 2024-05-22 | Disposition: A | Discharge status: Home / Self Care | Source: Ambulatory Visit | Attending: Orthopedic Surgery | Admitting: Orthopedic Surgery

## 2024-05-22 ENCOUNTER — Other Ambulatory Visit: Payer: Self-pay

## 2024-05-22 ENCOUNTER — Encounter (HOSPITAL_COMMUNITY): Payer: Self-pay

## 2024-05-22 VITALS — BP 130/60 | HR 65 | Resp 12 | Ht 66.0 in | Wt 124.0 lb

## 2024-05-22 DIAGNOSIS — I440 Atrioventricular block, first degree: Secondary | ICD-10-CM | POA: Diagnosis not present

## 2024-05-22 DIAGNOSIS — Z79890 Hormone replacement therapy (postmenopausal): Secondary | ICD-10-CM | POA: Diagnosis not present

## 2024-05-22 DIAGNOSIS — I10 Essential (primary) hypertension: Secondary | ICD-10-CM | POA: Diagnosis not present

## 2024-05-22 DIAGNOSIS — Z87891 Personal history of nicotine dependence: Secondary | ICD-10-CM | POA: Diagnosis not present

## 2024-05-22 DIAGNOSIS — E039 Hypothyroidism, unspecified: Secondary | ICD-10-CM | POA: Insufficient documentation

## 2024-05-22 DIAGNOSIS — Z01818 Encounter for other preprocedural examination: Secondary | ICD-10-CM | POA: Insufficient documentation

## 2024-05-22 DIAGNOSIS — M1712 Unilateral primary osteoarthritis, left knee: Secondary | ICD-10-CM | POA: Diagnosis not present

## 2024-05-22 DIAGNOSIS — N183 Chronic kidney disease, stage 3 unspecified: Secondary | ICD-10-CM | POA: Insufficient documentation

## 2024-05-22 HISTORY — DX: Cardiac murmur, unspecified: R01.1

## 2024-05-22 LAB — BASIC METABOLIC PANEL WITH GFR
Anion gap: 10 (ref 5–15)
BUN: 34 mg/dL — ABNORMAL HIGH (ref 8–23)
CO2: 23 mmol/L (ref 22–32)
Calcium: 9.6 mg/dL (ref 8.9–10.3)
Chloride: 101 mmol/L (ref 98–111)
Creatinine, Ser: 1.36 mg/dL — ABNORMAL HIGH (ref 0.44–1.00)
GFR, Estimated: 39 mL/min — ABNORMAL LOW (ref >60)
Glucose, Bld: 82 mg/dL (ref 70–99)
Potassium: 5 mmol/L (ref 3.5–5.1)
Sodium: 134 mmol/L — ABNORMAL LOW (ref 135–145)

## 2024-05-22 LAB — CBC
HCT: 36 % (ref 36.0–46.0)
Hemoglobin: 11.8 g/dL — ABNORMAL LOW (ref 12.0–15.0)
MCH: 30.3 pg (ref 26.0–34.0)
MCHC: 32.8 g/dL (ref 30.0–36.0)
MCV: 92.3 fL (ref 80.0–100.0)
Platelets: 243 K/uL (ref 150–400)
RBC: 3.9 MIL/uL (ref 3.87–5.11)
RDW: 13.2 % (ref 11.5–15.5)
WBC: 8.4 K/uL (ref 4.0–10.5)
nRBC: 0 % (ref 0.0–0.2)

## 2024-05-22 LAB — SURGICAL PCR SCREEN
MRSA, PCR: NEGATIVE
Staphylococcus aureus: NEGATIVE

## 2024-05-24 NOTE — Progress Notes (Signed)
 Anesthesia Chart Review   Case: [id number] Date/Time: 06/04/24 1405   Procedure: ARTHROPLASTY, KNEE, TOTAL (Left: Knee)   Anesthesia type: Spinal   Pre-op diagnosis: left knee osteoarthritis   Location: [facility] ROOM 10 / [facility]   Surgeons: Ernie Cough, MD       DISCUSSION:78 y.o. former smoker with h/o HTN, hypothyroidism on Synthroid , CKD Stage III, left knee OA scheduled for above procedure 06/04/24 with Dr. Cough Ernie.   Pt with h/o alcohol  abuse, per PCP notes doing well with abstinence.   Echo 07/11/2014 - Left ventricle: Upper septal thickening. No LVOT gradient. The    cavity size was normal. Wall thickness was increased in a pattern    of mild LVH. The estimated ejection fraction was 65%.  - Aortic valve: The valve appears to be grossly normal. There was    trivial regurgitation.  - Right ventricle: The cavity size was normal. Systolic function    was normal.   Pt reports she can climb a flight of stairs without chest pain or shortness of breath.  VS: BP 130/60   Pulse 65   Resp 12   Ht 5' 6 (1.676 m)   Wt 56.2 kg   SpO2 100%   BMI 20.01 kg/m   PROVIDERS: ViaFranky, MD is PCP    LABS: Labs reviewed: Acceptable for surgery. (all labs ordered are listed, but only abnormal results are displayed)  Labs Reviewed  BASIC METABOLIC PANEL WITH GFR - Abnormal; Notable for the following components:      Result Value   Sodium 134 (*)    BUN 34 (*)    Creatinine, Ser 1.36 (*)    GFR, Estimated 39 (*)    All other components within normal limits  CBC - Abnormal; Notable for the following components:   Hemoglobin 11.8 (*)    All other components within normal limits  SURGICAL PCR SCREEN     IMAGES:   EKG:   CV:  Past Medical History:  Diagnosis Date   Alcoholism (HCC)    Cancer of uvula (HCC)    2021   Heart murmur    Hypertension    Hypothyroidism     Past Surgical History:  Procedure Laterality Date   COLONOSCOPY     KNEE SURGERY Left    x 2    TONSILLECTOMY      MEDICATIONS:  Calcium  Carbonate-Vitamin D  (CALCIUM  + D PO)   carboxymethylcellulose (REFRESH TEARS) 0.5 % SOLN   denosumab  (PROLIA ) 60 MG/ML SOSY injection   levothyroxine  (SYNTHROID ) 112 MCG tablet   losartan (COZAAR) 50 MG tablet   Multiple Vitamin (MULTIVITAMIN) tablet   naphazoline-pheniramine (NAPHCON-A) 0.025-0.3 % ophthalmic solution   vitamin E 1000 UNIT capsule   No current facility-administered medications for this encounter.     Harlene Hoots Ward, PA-C [facility] Pre-Surgical Testing [phone]

## 2024-05-31 DIAGNOSIS — I10 Essential (primary) hypertension: Secondary | ICD-10-CM | POA: Diagnosis not present

## 2024-05-31 DIAGNOSIS — E039 Hypothyroidism, unspecified: Secondary | ICD-10-CM | POA: Diagnosis not present

## 2024-05-31 DIAGNOSIS — N1832 Chronic kidney disease, stage 3b: Secondary | ICD-10-CM | POA: Diagnosis not present

## 2024-05-31 DIAGNOSIS — E785 Hyperlipidemia, unspecified: Secondary | ICD-10-CM | POA: Diagnosis not present

## 2024-05-31 DIAGNOSIS — F331 Major depressive disorder, recurrent, moderate: Secondary | ICD-10-CM | POA: Diagnosis not present

## 2024-06-02 DIAGNOSIS — N1832 Chronic kidney disease, stage 3b: Secondary | ICD-10-CM | POA: Diagnosis not present

## 2024-06-02 DIAGNOSIS — I10 Essential (primary) hypertension: Secondary | ICD-10-CM | POA: Diagnosis not present

## 2024-06-03 NOTE — H&P (Signed)
 TOTAL KNEE ADMISSION H&P  Patient is being admitted for left total knee arthroplasty.  Therapy Plans: outpatient therapy at [facility] Disposition: Home with sister Planned DVT Prophylaxis: aspirin  81mg  BID DME needed: none PCP: Dr. Lenora - clearance received TXA: IV Allergies: folic acid , niacin - rash, IV contrast - Anesthesia Concerns: none BMI: 20.4 Last HgbA1c: Not diabetic   Other: - tolerates betadine ** - staying overnight - Hx of grave's disease - hx of uvular cancer - s/p excision at [facility] 2021 - tramadol/oxycodone , robaxin, tylenol  - NO NSAIDs -- CKD - No hx of VTE   Subjective:  Chief Complaint:left knee pain.  HPI: Mary Graves, 78 y.o. female, has a history of pain and functional disability in the left knee due to arthritis and has failed non-surgical conservative treatments for greater than 12 weeks to includeNSAID's and/or analgesics, corticosteriod injections, and activity modification.  Onset of symptoms was gradual, starting 2 years ago with gradually worsening course since that time. The patient noted prior procedures on the knee to include  arthroscopy and menisectomy on the left knee(s).  Patient currently rates pain in the left knee(s) at 8 out of 10 with activity. Patient has worsening of pain with activity and weight bearing and pain that interferes with activities of daily living.  Patient has evidence of joint space narrowing by imaging studies. There is no active infection.  Patient Active Problem List   Diagnosis Date Noted   Elevated troponin 07/11/2014   Syncope 07/11/2014   PTSD (post-traumatic stress disorder) 10/31/2013   Alcoholism in family 10/31/2013   Dementia (HCC) 10/09/2013   HLD (hyperlipidemia) 02/27/2013   Osteopenia 02/27/2013   Alcohol  dependence (HCC) 06/01/2012   Major depression 06/01/2012   Hypothyroidism 02/23/2009   Essential hypertension 02/23/2009   HYPERGLYCEMIA 02/23/2009   Past Medical History:  Diagnosis  Date   Alcoholism (HCC)    Cancer of uvula (HCC)    2021   Heart murmur    Hypertension    Hypothyroidism     Past Surgical History:  Procedure Laterality Date   COLONOSCOPY     KNEE SURGERY Left    x 2   TONSILLECTOMY      No current facility-administered medications for this encounter.   Current Outpatient Medications  Medication Sig Dispense Refill Last Dose/Taking   Calcium  Carbonate-Vitamin D  (CALCIUM  + D PO) Take 1 tablet by mouth daily.    Taking   carboxymethylcellulose (REFRESH TEARS) 0.5 % SOLN Place 1 drop into both eyes as needed (dry eyes).   Taking As Needed   denosumab  (PROLIA ) 60 MG/ML SOSY injection Inject 60 mg into the skin every 6 (six) months.   Taking   levothyroxine  (SYNTHROID ) 112 MCG tablet Take 112 mcg by mouth daily before breakfast.   Taking   losartan (COZAAR) 50 MG tablet Take 50 mg by mouth daily.   Taking   Multiple Vitamin (MULTIVITAMIN) tablet Take 1 tablet by mouth daily.   Taking   naphazoline-pheniramine (NAPHCON-A) 0.025-0.3 % ophthalmic solution Place 1 drop into both eyes 4 (four) times daily as needed for eye irritation or allergies.   Taking As Needed   vitamin E 1000 UNIT capsule Take 2,000 Units by mouth daily.   Taking   Allergies  Allergen Reactions   Ace Inhibitors Cough   Folic Acid  Rash    Social History   Tobacco Use   Smoking status: Former    Current packs/day: 0.00    Types: Cigarettes    Quit date:  11/06/1983    Years since quitting: 40.6   Smokeless tobacco: Never  Substance Use Topics   Alcohol  use: Not Currently    Comment: history of alcoholism    Family History  Problem Relation Age of Onset   Alcohol  abuse Mother    Breast cancer Sister 77   Cervical cancer Sister 42   Breast cancer Paternal Grandmother    Breast cancer Cousin 6 - 40   Heart disease Neg Hx    Hyperlipidemia Neg Hx    Hypertension Neg Hx    Stroke Neg Hx      Review of Systems  Constitutional:  Negative for chills and fever.   Respiratory:  Negative for cough and shortness of breath.   Cardiovascular:  Negative for chest pain.  Gastrointestinal:  Negative for nausea and vomiting.  Musculoskeletal:  Positive for arthralgias.     Objective:  Physical Exam Left knee exam: No palpable effusion, warmth erythema No significant flexion contracture Mild valgus left knee Tenderness of the lateral and posterior lateral aspect knee Flexion of 120 degrees with tightness and mild crepitation Normal ipsilateral left hip exam without groin pain or referred pain  Vital signs in last 24 hours:    Labs:   Estimated body mass index is 20.01 kg/m as calculated from the following:   Height as of 05/22/24: 5' 6 (1.676 m).   Weight as of 05/22/24: 56.2 kg.   Imaging Review Plain radiographs demonstrate severe degenerative joint disease of the left knee(s). The overall alignment isneutral. The bone quality appears to be adequate for age and reported activity level.      Assessment/Plan:  End stage arthritis, left knee   The patient history, physical examination, clinical judgment of the provider and imaging studies are consistent with end stage degenerative joint disease of the left knee(s) and total knee arthroplasty is deemed medically necessary. The treatment options including medical management, injection therapy arthroscopy and arthroplasty were discussed at length. The risks and benefits of total knee arthroplasty were presented and reviewed. The risks due to aseptic loosening, infection, stiffness, patella tracking problems, thromboembolic complications and other imponderables were discussed. The patient acknowledged the explanation, agreed to proceed with the plan and consent was signed. Patient is being admitted for inpatient treatment for surgery, pain control, PT, OT, prophylactic antibiotics, VTE prophylaxis, progressive ambulation and ADL's and discharge planning. The patient is planning to be discharged  home.     Patient's anticipated LOS is less than 2 midnights, meeting these requirements: - Younger than 33 - Lives within 1 hour of care - Has a competent adult at home to recover with post-op recover - NO history of  - Chronic pain requiring opiods  - Diabetes  - Coronary Artery Disease  - Heart failure  - Heart attack  - Stroke  - DVT/VTE  - Cardiac arrhythmia  - Respiratory Failure/COPD  - Renal failure  - Anemia  - Advanced Liver disease  Rosina Calin, PA-C Orthopedic Surgery [facility] [place] [phone]

## 2024-06-04 ENCOUNTER — Observation Stay (HOSPITAL_COMMUNITY)
Admission: RE | Admit: 2024-06-04 | Discharge: 2024-06-05 | Disposition: A | Discharge status: Home / Self Care | Source: Ambulatory Visit | Attending: Orthopedic Surgery | Admitting: Orthopedic Surgery

## 2024-06-04 ENCOUNTER — Ambulatory Visit (HOSPITAL_COMMUNITY): Payer: Self-pay | Admitting: Anesthesiology

## 2024-06-04 ENCOUNTER — Other Ambulatory Visit: Payer: Self-pay

## 2024-06-04 ENCOUNTER — Ambulatory Visit (HOSPITAL_COMMUNITY): Payer: Self-pay | Admitting: Physician Assistant

## 2024-06-04 ENCOUNTER — Encounter (HOSPITAL_COMMUNITY): Payer: Self-pay | Admitting: Orthopedic Surgery

## 2024-06-04 ENCOUNTER — Encounter (HOSPITAL_COMMUNITY)
Admission: RE | Disposition: A | Discharge status: Home / Self Care | Payer: Self-pay | Source: Ambulatory Visit | Attending: Orthopedic Surgery

## 2024-06-04 DIAGNOSIS — M1712 Unilateral primary osteoarthritis, left knee: Principal | ICD-10-CM | POA: Insufficient documentation

## 2024-06-04 DIAGNOSIS — Z7982 Long term (current) use of aspirin: Secondary | ICD-10-CM | POA: Insufficient documentation

## 2024-06-04 DIAGNOSIS — M25562 Pain in left knee: Secondary | ICD-10-CM | POA: Diagnosis present

## 2024-06-04 DIAGNOSIS — Z96652 Presence of left artificial knee joint: Principal | ICD-10-CM

## 2024-06-04 DIAGNOSIS — I10 Essential (primary) hypertension: Secondary | ICD-10-CM | POA: Diagnosis not present

## 2024-06-04 DIAGNOSIS — Z85818 Personal history of malignant neoplasm of other sites of lip, oral cavity, and pharynx: Secondary | ICD-10-CM | POA: Insufficient documentation

## 2024-06-04 DIAGNOSIS — G8918 Other acute postprocedural pain: Secondary | ICD-10-CM | POA: Diagnosis not present

## 2024-06-04 DIAGNOSIS — Z87891 Personal history of nicotine dependence: Secondary | ICD-10-CM | POA: Insufficient documentation

## 2024-06-04 DIAGNOSIS — E039 Hypothyroidism, unspecified: Secondary | ICD-10-CM | POA: Insufficient documentation

## 2024-06-04 DIAGNOSIS — Z79899 Other long term (current) drug therapy: Secondary | ICD-10-CM | POA: Diagnosis not present

## 2024-06-04 HISTORY — PX: TOTAL KNEE ARTHROPLASTY: SHX125

## 2024-06-04 SURGERY — ARTHROPLASTY, KNEE, TOTAL
Anesthesia: Spinal | Site: Knee | Laterality: Left

## 2024-06-04 MED ORDER — FENTANYL CITRATE (PF) 50 MCG/ML IJ SOSY
100.0000 ug | PREFILLED_SYRINGE | INTRAMUSCULAR | Status: DC
  Administered 2024-06-04: 50 ug via INTRAVENOUS
  Filled 2024-06-04: qty 2

## 2024-06-04 MED ORDER — METHOCARBAMOL 500 MG PO TABS
500.0000 mg | ORAL_TABLET | Freq: Four times a day (QID) | ORAL | Status: DC | PRN
  Administered 2024-06-04 – 2024-06-05 (×2): 500 mg via ORAL
  Filled 2024-06-04 (×2): qty 1

## 2024-06-04 MED ORDER — OXYCODONE HCL 5 MG/5ML PO SOLN: 5.0000 mg | Freq: Once | ORAL | Status: DC | PRN

## 2024-06-04 MED ORDER — DIPHENHYDRAMINE HCL 12.5 MG/5ML PO ELIX: 12.5000 mg | ORAL_SOLUTION | ORAL | Status: DC | PRN

## 2024-06-04 MED ORDER — SODIUM CHLORIDE (PF) 0.9 % IJ SOLN: INTRAMUSCULAR | Status: DC | PRN

## 2024-06-04 MED ORDER — ONDANSETRON HCL 4 MG PO TABS: 4.0000 mg | ORAL_TABLET | Freq: Four times a day (QID) | ORAL | Status: DC | PRN

## 2024-06-04 MED ORDER — BUPIVACAINE HCL (PF) 0.5 % IJ SOLN
INTRAMUSCULAR | Status: DC | PRN
  Administered 2024-06-04: 2.3 mL via INTRATHECAL

## 2024-06-04 MED ORDER — METOCLOPRAMIDE HCL 5 MG PO TABS: 5.0000 mg | ORAL_TABLET | Freq: Three times a day (TID) | ORAL | Status: DC | PRN

## 2024-06-04 MED ORDER — BUPIVACAINE-EPINEPHRINE (PF) 0.25% -1:200000 IJ SOLN
INTRAMUSCULAR | Status: AC
  Filled 2024-06-04: qty 30

## 2024-06-04 MED ORDER — PROPOFOL 500 MG/50ML IV EMUL
INTRAVENOUS | Status: DC | PRN
  Administered 2024-06-04: 100 mg via INTRAVENOUS
  Administered 2024-06-04: 150 ug/kg/min via INTRAVENOUS

## 2024-06-04 MED ORDER — ACETAMINOPHEN 500 MG PO TABS
1000.0000 mg | ORAL_TABLET | Freq: Four times a day (QID) | ORAL | Status: DC
  Administered 2024-06-04 – 2024-06-05 (×3): 1000 mg via ORAL
  Filled 2024-06-04 (×3): qty 2

## 2024-06-04 MED ORDER — DEXAMETHASONE SOD PHOSPHATE PF 10 MG/ML IJ SOLN
INTRAMUSCULAR | Status: DC | PRN
  Administered 2024-06-04: 4 mg via INTRAVENOUS

## 2024-06-04 MED ORDER — OXYCODONE HCL 5 MG PO TABS: 5.0000 mg | ORAL_TABLET | Freq: Once | ORAL | Status: DC | PRN

## 2024-06-04 MED ORDER — LEVOTHYROXINE SODIUM 112 MCG PO TABS
112.0000 ug | ORAL_TABLET | Freq: Every day | ORAL | Status: DC
  Administered 2024-06-05: 112 ug via ORAL
  Filled 2024-06-04: qty 1

## 2024-06-04 MED ORDER — CEFAZOLIN SODIUM-DEXTROSE 2-4 GM/100ML-% IV SOLN
2.0000 g | INTRAVENOUS | Status: AC
  Administered 2024-06-04: 2 g via INTRAVENOUS
  Filled 2024-06-04: qty 100

## 2024-06-04 MED ORDER — POVIDONE-IODINE 10 % EX SWAB
2.0000 | Freq: Once | CUTANEOUS | Status: AC
  Administered 2024-06-04: 2 via TOPICAL

## 2024-06-04 MED ORDER — ASPIRIN 81 MG PO CHEW
81.0000 mg | CHEWABLE_TABLET | Freq: Two times a day (BID) | ORAL | Status: DC
  Administered 2024-06-04 – 2024-06-05 (×2): 81 mg via ORAL
  Filled 2024-06-04 (×2): qty 1

## 2024-06-04 MED ORDER — LACTATED RINGERS IV SOLN: INTRAVENOUS | Status: DC

## 2024-06-04 MED ORDER — 0.9 % SODIUM CHLORIDE (POUR BTL) OPTIME
TOPICAL | Status: DC | PRN
  Administered 2024-06-04: 1000 mL

## 2024-06-04 MED ORDER — STERILE WATER FOR IRRIGATION IR SOLN
Status: DC | PRN
  Administered 2024-06-04: 2000 mL

## 2024-06-04 MED ORDER — LOSARTAN POTASSIUM 50 MG PO TABS
50.0000 mg | ORAL_TABLET | Freq: Every day | ORAL | Status: DC
  Administered 2024-06-05: 50 mg via ORAL
  Filled 2024-06-04: qty 1

## 2024-06-04 MED ORDER — TRANEXAMIC ACID-NACL 1000-0.7 MG/100ML-% IV SOLN
1000.0000 mg | INTRAVENOUS | Status: AC
  Administered 2024-06-04: 1000 mg via INTRAVENOUS
  Filled 2024-06-04: qty 100

## 2024-06-04 MED ORDER — DEXAMETHASONE SOD PHOSPHATE PF 10 MG/ML IJ SOLN: 8.0000 mg | Freq: Once | INTRAMUSCULAR | Status: DC

## 2024-06-04 MED ORDER — MENTHOL 3 MG MT LOZG: 1.0000 | LOZENGE | OROMUCOSAL | Status: DC | PRN

## 2024-06-04 MED ORDER — SODIUM CHLORIDE 0.9 % IV SOLN: INTRAVENOUS | Status: DC

## 2024-06-04 MED ORDER — MIDAZOLAM HCL (PF) 2 MG/2ML IJ SOLN: 2.0000 mg | INTRAMUSCULAR | Status: DC

## 2024-06-04 MED ORDER — TRAMADOL HCL 50 MG PO TABS
50.0000 mg | ORAL_TABLET | Freq: Four times a day (QID) | ORAL | Status: DC | PRN
  Administered 2024-06-05: 100 mg via ORAL
  Filled 2024-06-04: qty 2

## 2024-06-04 MED ORDER — CHLORHEXIDINE GLUCONATE 0.12 % MT SOLN
15.0000 mL | Freq: Once | OROMUCOSAL | Status: AC
  Administered 2024-06-04: 15 mL via OROMUCOSAL

## 2024-06-04 MED ORDER — DEXAMETHASONE SOD PHOSPHATE PF 10 MG/ML IJ SOLN
10.0000 mg | Freq: Once | INTRAMUSCULAR | Status: AC
  Administered 2024-06-05: 10 mg via INTRAVENOUS

## 2024-06-04 MED ORDER — SODIUM CHLORIDE 0.9 % IR SOLN
Status: DC | PRN
  Administered 2024-06-04: 1000 mL

## 2024-06-04 MED ORDER — ALUM & MAG HYDROXIDE-SIMETH 200-200-20 MG/5ML PO SUSP: 30.0000 mL | ORAL | Status: DC | PRN

## 2024-06-04 MED ORDER — PHENOL 1.4 % MT LIQD: 1.0000 | OROMUCOSAL | Status: DC | PRN

## 2024-06-04 MED ORDER — METOCLOPRAMIDE HCL 5 MG/ML IJ SOLN: 5.0000 mg | Freq: Three times a day (TID) | INTRAMUSCULAR | Status: DC | PRN

## 2024-06-04 MED ORDER — DROPERIDOL 2.5 MG/ML IJ SOLN: 0.6250 mg | Freq: Once | INTRAMUSCULAR | Status: DC | PRN

## 2024-06-04 MED ORDER — ORAL CARE MOUTH RINSE: 15.0000 mL | Freq: Once | OROMUCOSAL | Status: AC

## 2024-06-04 MED ORDER — TRANEXAMIC ACID-NACL 1000-0.7 MG/100ML-% IV SOLN
1000.0000 mg | Freq: Once | INTRAVENOUS | Status: AC
  Administered 2024-06-04: 1000 mg via INTRAVENOUS
  Filled 2024-06-04: qty 100

## 2024-06-04 MED ORDER — SODIUM CHLORIDE (PF) 0.9 % IJ SOLN
INTRAMUSCULAR | Status: AC
  Filled 2024-06-04: qty 50

## 2024-06-04 MED ORDER — FENTANYL CITRATE (PF) 50 MCG/ML IJ SOSY: 25.0000 ug | PREFILLED_SYRINGE | INTRAMUSCULAR | Status: DC | PRN

## 2024-06-04 MED ORDER — ACETAMINOPHEN 500 MG PO TABS
1000.0000 mg | ORAL_TABLET | Freq: Once | ORAL | Status: AC
  Administered 2024-06-04: 1000 mg via ORAL
  Filled 2024-06-04: qty 2

## 2024-06-04 MED ORDER — HYDROMORPHONE HCL 1 MG/ML IJ SOLN
0.5000 mg | INTRAMUSCULAR | Status: DC | PRN
  Administered 2024-06-04: 1 mg via INTRAVENOUS
  Filled 2024-06-04: qty 1

## 2024-06-04 MED ORDER — OXYCODONE HCL 5 MG PO TABS
5.0000 mg | ORAL_TABLET | ORAL | Status: DC | PRN
  Administered 2024-06-04 – 2024-06-05 (×2): 5 mg via ORAL
  Administered 2024-06-05 (×2): 10 mg via ORAL
  Filled 2024-06-04: qty 2
  Filled 2024-06-04 (×2): qty 1
  Filled 2024-06-04: qty 2

## 2024-06-04 MED ORDER — BUPIVACAINE-EPINEPHRINE (PF) 0.25% -1:200000 IJ SOLN
INTRAMUSCULAR | Status: DC | PRN
  Administered 2024-06-04: 20 mL via PERINEURAL

## 2024-06-04 MED ORDER — BISACODYL 10 MG RE SUPP
10.0000 mg | Freq: Every day | RECTAL | Status: DC | PRN
Start: 2024-06-04 — End: 2024-06-05

## 2024-06-04 MED ORDER — METHOCARBAMOL 1000 MG/10ML IJ SOLN
500.0000 mg | Freq: Four times a day (QID) | INTRAMUSCULAR | Status: DC | PRN
Start: 2024-06-04 — End: 2024-06-05

## 2024-06-04 MED ORDER — CEFAZOLIN SODIUM-DEXTROSE 2-4 GM/100ML-% IV SOLN
2.0000 g | Freq: Four times a day (QID) | INTRAVENOUS | Status: AC
  Administered 2024-06-04 – 2024-06-05 (×2): 2 g via INTRAVENOUS
  Filled 2024-06-04 (×2): qty 100

## 2024-06-04 MED ORDER — POLYETHYLENE GLYCOL 3350 17 G PO PACK
17.0000 g | PACK | Freq: Two times a day (BID) | ORAL | Status: DC
  Administered 2024-06-04 – 2024-06-05 (×2): 17 g via ORAL
  Filled 2024-06-04 (×2): qty 1

## 2024-06-04 MED ORDER — SENNA 8.6 MG PO TABS
2.0000 | ORAL_TABLET | Freq: Every day | ORAL | Status: DC
  Administered 2024-06-04: 17.2 mg via ORAL
  Filled 2024-06-04: qty 2

## 2024-06-04 MED ORDER — KETOROLAC TROMETHAMINE 30 MG/ML IJ SOLN
INTRAMUSCULAR | Status: AC
  Filled 2024-06-04: qty 1

## 2024-06-04 MED ORDER — ONDANSETRON HCL 4 MG/2ML IJ SOLN
4.0000 mg | Freq: Four times a day (QID) | INTRAMUSCULAR | Status: DC | PRN
  Administered 2024-06-04: 4 mg via INTRAVENOUS
  Filled 2024-06-04: qty 2

## 2024-06-04 SURGICAL SUPPLY — 44 items
ATTUNE MED ANAT PAT 32 KNEE (Knees) IMPLANT
BAG COUNTER SPONGE SURGICOUNT (BAG) IMPLANT
BAG ZIPLOCK 12X15 (MISCELLANEOUS) ×1 IMPLANT
BASEPLATE TIB CMT FB PCKT SZ4 (Stem) IMPLANT
BLADE SAW SGTL 11.0X1.19X90.0M (BLADE) IMPLANT
BLADE SAW SGTL 13.0X1.19X90.0M (BLADE) ×1 IMPLANT
BNDG ELASTIC 6INX 5YD STR LF (GAUZE/BANDAGES/DRESSINGS) ×1 IMPLANT
BOWL SMART MIX CTS (DISPOSABLE) ×1 IMPLANT
CEMENT HV SMART SET (Cement) ×2 IMPLANT
COMPONENT FEM CMT ATTN NRS 4LT (Joint) IMPLANT
COVER SURGICAL LIGHT HANDLE (MISCELLANEOUS) ×1 IMPLANT
CUFF TRNQT CYL 34X4.125X (TOURNIQUET CUFF) ×1 IMPLANT
DERMABOND ADVANCED .7 DNX12 (GAUZE/BANDAGES/DRESSINGS) ×1 IMPLANT
DRAPE U-SHAPE 47X51 STRL (DRAPES) ×1 IMPLANT
DRESSING AQUACEL AG SP 3.5X10 (GAUZE/BANDAGES/DRESSINGS) ×1 IMPLANT
DURAPREP 26ML APPLICATOR (WOUND CARE) ×2 IMPLANT
ELECT REM PT RETURN 15FT ADLT (MISCELLANEOUS) ×1 IMPLANT
GLOVE BIO SURGEON STRL SZ 6 (GLOVE) ×1 IMPLANT
GLOVE BIOGEL PI IND STRL 6.5 (GLOVE) ×1 IMPLANT
GLOVE BIOGEL PI IND STRL 7.5 (GLOVE) ×1 IMPLANT
GLOVE ORTHO TXT STRL SZ7.5 (GLOVE) ×2 IMPLANT
GOWN STRL REUS W/ TWL LRG LVL3 (GOWN DISPOSABLE) ×2 IMPLANT
HOLDER FOLEY CATH W/STRAP (MISCELLANEOUS) IMPLANT
INSERT MED ATTUNE KNEE 4X6 LT (Insert) IMPLANT
KIT TURNOVER KIT A (KITS) ×1 IMPLANT
MANIFOLD NEPTUNE II (INSTRUMENTS) ×1 IMPLANT
NDL SAFETY ECLIPSE 18X1.5 (NEEDLE) IMPLANT
NS IRRIG 1000ML POUR BTL (IV SOLUTION) ×1 IMPLANT
PACK TOTAL KNEE CUSTOM (KITS) ×1 IMPLANT
PENCIL SMOKE EVACUATOR (MISCELLANEOUS) ×1 IMPLANT
PIN FIX SIGMA LCS THRD HI (PIN) IMPLANT
PROTECTOR NERVE ULNAR (MISCELLANEOUS) ×1 IMPLANT
SET HNDPC FAN SPRY TIP SCT (DISPOSABLE) ×1 IMPLANT
SET PAD KNEE POSITIONER (MISCELLANEOUS) ×1 IMPLANT
SPIKE FLUID TRANSFER (MISCELLANEOUS) ×2 IMPLANT
SUT MNCRL AB 4-0 PS2 18 (SUTURE) ×1 IMPLANT
SUT STRATAFIX PDS+ 0 24IN (SUTURE) ×1 IMPLANT
SUT VIC AB 1 CT1 36 (SUTURE) ×1 IMPLANT
SUT VIC AB 2-0 CT1 TAPERPNT 27 (SUTURE) ×2 IMPLANT
SYR 3ML LL SCALE MARK (SYRINGE) ×1 IMPLANT
TOWEL GREEN STERILE FF (TOWEL DISPOSABLE) ×1 IMPLANT
TUBE SUCTION HIGH CAP CLEAR NV (SUCTIONS) ×1 IMPLANT
WATER STERILE IRR 1000ML POUR (IV SOLUTION) ×2 IMPLANT
WRAP KNEE MAXI GEL POST OP (GAUZE/BANDAGES/DRESSINGS) ×1 IMPLANT

## 2024-06-04 NOTE — Anesthesia Procedure Notes (Signed)
 Spinal  Patient location during procedure: OR Start time: 06/04/2024 2:55 PM End time: 06/04/2024 3:00 PM Reason for block: surgical anesthesia Staffing Performed: anesthesiologist  Anesthesiologist: Boone Fess, MD Performed by: Boone Fess, MD Authorized by: Boone Fess, MD   Preanesthetic Checklist Completed: patient identified, IV checked, site marked, risks and benefits discussed, surgical consent, monitors and equipment checked, pre-op evaluation and timeout performed Spinal Block Patient position: sitting Prep: ChloraPrep and site prepped and draped Patient monitoring: heart rate, continuous pulse ox, blood pressure and cardiac monitor Approach: midline Location: L3-4 Injection technique: single-shot Needle Needle type: Quincke  Needle gauge: 22 G Needle length: 9 cm Assessment Sensory level: T10 Events: CSF return Additional Notes Two skin entry sites, difficult to locate interspinous spaces. Meticulous sterile technique used throughout (CHG prep, sterile gloves, sterile drape). Negative paresthesia. Negative blood return. Positive free-flowing CSF. Expiration date of kit checked and confirmed. Patient tolerated procedure well, without complications.

## 2024-06-04 NOTE — Anesthesia Preprocedure Evaluation (Signed)
 Anesthesia Evaluation  Patient identified by MRN, date of birth, ID band Patient awake  General Assessment Comment:  AO x 3  Reviewed: Allergy  & Precautions, NPO status , Patient's Chart, lab work & pertinent test results  History of Anesthesia Complications Negative for: history of anesthetic complications  Airway Mallampati: II  TM Distance: >3 FB Neck ROM: Full    Dental  (+) Caps   Pulmonary neg pulmonary ROS, neg sleep apnea, neg COPD, Patient abstained from smoking.Not current smoker, former smoker   Pulmonary exam normal breath sounds clear to auscultation       Cardiovascular Exercise Tolerance: Good METShypertension, Pt. on medications (-) CAD and (-) Past MI (-) dysrhythmias  Rhythm:Regular Rate:Normal - Systolic murmurs    Neuro/Psych  PSYCHIATRIC DISORDERS Anxiety Depression   Dementia negative neurological ROS  negative psych ROS   GI/Hepatic ,neg GERD  ,,(+)     (-) substance abuse    Endo/Other  neg diabetesHypothyroidism    Renal/GU negative Renal ROS     Musculoskeletal   Abdominal   Peds  Hematology Denies blood thinner use or bleeding disorders.    Anesthesia Other Findings Denies blood thinner use or bleeding diatheses. Recent labs reviewed. Past Medical History: No date: Alcoholism [facility]) No date: Cancer of uvula (HCC)     Comment:  2021 No date: Heart murmur No date: Hypertension No date: Hypothyroidism   Reproductive/Obstetrics                              Anesthesia Physical Anesthesia Plan  ASA: 2  Anesthesia Plan: Spinal   Post-op Pain Management: Regional block* and Tylenol  PO (pre-op)*   Induction: Intravenous  PONV Risk Score and Plan: 2 and Ondansetron , Dexamethasone , Propofol infusion, TIVA and Treatment may vary due to age or medical condition  Airway Management Planned: Natural Airway  Additional Equipment: None  Intra-op Plan:    Post-operative Plan:   Informed Consent: I have reviewed the patients History and Physical, chart, labs and discussed the procedure including the risks, benefits and alternatives for the proposed anesthesia with the patient or authorized representative who has indicated his/her understanding and acceptance.       Plan Discussed with: CRNA and Surgeon  Anesthesia Plan Comments: (Discussed R/B/A of neuraxial anesthesia technique with patient: - rare risks of spinal/epidural hematoma, nerve damage, infection - Risk of PDPH - Risk of nausea and vomiting - Risk of conversion to general anesthesia and its associated risks, including sore throat, damage to lips/eyes/teeth/oropharynx, and rare risks such as cardiac and respiratory events. - Risk of allergic reactions  Discussed r/b/a of adductor canal nerve block, including:  - bleeding, infection, nerve damage - poor or non functioning block. - reactions and toxicity to local anesthetic Patient understands.   Discussed the role of CRNA in patient's perioperative care.  Patient voiced understanding.)        Anesthesia Quick Evaluation

## 2024-06-04 NOTE — Care Plan (Signed)
 Ortho Bundle Case Management Note  Patient Details  Name: Mary Graves MRN: [id number] Date of Birth: 12-Mar-1946  LT TKA on 06/04/24  DCP: Home with sister  DME: No needs; has RW and cane  PT: [facility]                  DME Arranged:  N/A DME Agency:  NA  HH Arranged:    HH Agency:     Additional Comments: Please contact me with any questions of if this plan should need to change.  Burnard Dross, Case Manager [facility]  [phone]  Ext. [phone]   06/04/2024, 12:45 PM

## 2024-06-04 NOTE — Transfer of Care (Signed)
 Immediate Anesthesia Transfer of Care Note  Patient: Mary Graves  Procedure(s) Performed: ARTHROPLASTY, KNEE, TOTAL (Left: Knee)  Patient Location: PACU  Anesthesia Type:MAC and Spinal  Level of Consciousness: awake, alert , and oriented  Airway & Oxygen Therapy: Patient Spontanous Breathing  Post-op Assessment: Report given to RN and Post -op Vital signs reviewed and stable  Post vital signs: Reviewed and stable  Last Vitals:  Vitals Value Taken Time  BP 148/66 06/04/24 16:45  Temp    Pulse 62 06/04/24 16:54  Resp 15 06/04/24 16:54  SpO2 99 % 06/04/24 16:54  Vitals shown include unfiled device data.  Last Pain:  Vitals:   06/04/24 1357  TempSrc: Oral  PainSc:          Complications: No notable events documented.

## 2024-06-04 NOTE — Interval H&P Note (Signed)
 History and Physical Interval Note:  06/04/2024 1:34 PM  Mary Graves  has presented today for surgery, with the diagnosis of left knee osteoarthritis.  The various methods of treatment have been discussed with the patient and family. After consideration of risks, benefits and other options for treatment, the patient has consented to  Procedure(s): ARTHROPLASTY, KNEE, TOTAL (Left) as a surgical intervention.  The patient's history has been reviewed, patient examined, no change in status, stable for surgery.  I have reviewed the patient's chart and labs.  Questions were answered to the patient's satisfaction.     [name]

## 2024-06-04 NOTE — Anesthesia Procedure Notes (Signed)
 Anesthesia Regional Block: Adductor canal block   Pre-Anesthetic Checklist: , timeout performed,  Correct Patient, Correct Site, Correct Laterality,  Correct Procedure, Correct Position, site marked,  Risks and benefits discussed,  Surgical consent,  Pre-op evaluation,  At surgeon's request and post-op pain management  Laterality: Lower and Left  Prep: chloraprep       Needles:  Injection technique: Single-shot  Needle Type: Echogenic Needle     Needle Length: 9cm  Needle Gauge: 21     Additional Needles:   Procedures:,,,, ultrasound used (permanent image in chart),,    Narrative:  Start time: 06/04/2024 1:56 PM End time: 06/04/2024 1:58 PM Injection made incrementally with aspirations every 5 mL.  Performed by: Personally  Anesthesiologist: Boone Fess, MD  Additional Notes: Patient's chart reviewed and they were deemed appropriate candidate for procedure, per surgeon's request. Patient educated about risks, benefits, and alternatives of the block including but not limited to: temporary or permanent nerve damage, bleeding, infection, damage to surround tissues, block failure, local anesthetic toxicity. Patient expressed understanding. A formal time-out was conducted consistent with institution rules.  Monitors were applied, and minimal sedation used (see nursing record). The site was prepped with skin prep and allowed to dry, and sterile gloves were used. A high frequency linear ultrasound probe with probe cover was utilized throughout. Femoral artery visualized at mid-thigh level, local anesthetic injected anterolateral to it, and echogenic block needle trajectory was monitored throughout. Hydrodissection of saphenous nerve visualized and appeared anatomically normal. Aspiration performed every 5ml. Blood vessels were avoided. All injections were performed without resistance and free of blood and paresthesias. The patient tolerated the procedure well.  Injectate: 20ml 0.25%  bupivacaine + epinephrine

## 2024-06-04 NOTE — Anesthesia Postprocedure Evaluation (Signed)
 Anesthesia Post Note  Patient: Mary Graves  Procedure(s) Performed: ARTHROPLASTY, KNEE, TOTAL (Left: Knee)     Patient location during evaluation: PACU Anesthesia Type: Spinal Level of consciousness: oriented and awake and alert Pain management: pain level controlled Vital Signs Assessment: post-procedure vital signs reviewed and stable Respiratory status: spontaneous breathing, respiratory function stable and patient connected to nasal cannula oxygen Cardiovascular status: blood pressure returned to baseline and stable Postop Assessment: no headache, no backache and no apparent nausea or vomiting Anesthetic complications: no   No notable events documented.  Last Vitals:  Vitals:   06/04/24 1715 06/04/24 1730  BP: (!) 148/66 (!) 160/68  Pulse: (!) 57 (!) 53  Resp: 12 16  Temp:  (!) 36.4 C  SpO2: 100% 100%    Last Pain:  Vitals:   06/04/24 1730  TempSrc:   PainSc: 0-No pain    LLE Motor Response: No movement due to regional block (06/04/24 1730)   RLE Motor Response: No movement due to regional block (06/04/24 1730)   L Sensory Level: L2-Upper inner thigh, upper buttock (06/04/24 1730) R Sensory Level: L2-Upper inner thigh, upper buttock (06/04/24 1730)  Rome Ade

## 2024-06-04 NOTE — Discharge Instructions (Signed)

## 2024-06-04 NOTE — Op Note (Signed)
 NAME:  Mary Graves                      MEDICAL RECORD NO.:  [id number]                             FACILITY:  [facility]      PHYSICIAN:  Donnice BIRCH. Ernie, M.D.  DATE OF BIRTH:  01/15/1946      DATE OF PROCEDURE:  06/04/2024                                     OPERATIVE REPORT         PREOPERATIVE DIAGNOSIS:  Left knee osteoarthritis.      POSTOPERATIVE DIAGNOSIS:  Left knee osteoarthritis.      FINDINGS:  The patient was noted to have complete loss of cartilage and   bone-on-bone arthritis with associated osteophytes in the lateral and patellofemoral compartments of   the knee with a significant synovitis and associated effusion.  The patient had failed months of conservative treatment including medications, injection therapy, activity modification.     PROCEDURE:  Left total knee replacement.      COMPONENTS USED:  DePuy Attune FB CR MS knee   system, a size 4N femur, 4 tibia, size 6 mm CR MS AOX insert, and 32 anatomic patellar   button.      SURGEON:  Donnice BIRCH. Ernie, M.D.      ASSISTANT:  Rosina Calin, PA-C.      ANESTHESIA:  Regional and Spinal.      SPECIMENS:  None.      COMPLICATION:  None.      DRAINS:  None.  EBL: <250 cc      TOURNIQUET TIME:  No tourniquet used     The patient was stable to the recovery room.      INDICATION FOR PROCEDURE:  Mary Graves is a 78 y.o. female patient of   mine.  The patient had been seen, evaluated, and treated for months conservatively in the   office with medication, activity modification, and injections.  The patient had   radiographic changes of bone-on-bone arthritis with endplate sclerosis and osteophytes noted.  Based on the radiographic changes and failed conservative measures, the patient   decided to proceed with definitive treatment, total knee replacement.  Risks of infection, DVT, component failure, need for revision surgery, neurovascular injury were reviewed in the office setting.  The postop course was reviewed  stressing the efforts to maximize post-operative satisfaction and function.  Consent was obtained for benefit of pain   relief.      PROCEDURE IN DETAIL:  The patient was brought to the operative theater.   Once adequate anesthesia, preoperative antibiotics, 2 gm of Ancef,1 gm of Tranexamic Acid, and 10 mg of Decadron  administered, the patient was positioned supine with a left thigh tourniquet placed.  The  left lower extremity was prepped and draped in sterile fashion.  A time-   out was performed identifying the patient, planned procedure, and the appropriate extremity.      The left lower extremity was placed in the [facility] leg holder.  A midline incision was   made followed by median parapatellar arthrotomy.  Following initial   exposure, attention was first directed to the patella.  Precut   measurement was noted to be 22 mm.  I resected down to 13 mm and used a   32 anatomic patellar button to restore patellar height as well as cover the cut surface.      The lug holes were drilled and a metal shim was placed to protect the   patella from retractors and saw blade during the procedure.      At this point, attention was now directed to the femur.  The femoral   canal was opened with a drill, irrigated to try to prevent fat emboli.  An   intramedullary rod was passed at 3 degrees valgus, 8 mm of bone was   resected off the distal femur.  Following this resection, the tibia was   subluxated anteriorly.  Using the extramedullary guide, 2 mm of bone was resected off   the proximal medial tibia.  We confirmed the gap would be   stable medially and laterally with a size 5 spacer block as well as confirmed that the tibial cut was perpendicular in the coronal plane, checking with an alignment rod.      Once this was done, I sized the femur to be a size 4 in the anterior-   posterior dimension, chose a narrow component based on medial and   lateral dimension.  The size 4 rotation block was then  pinned in   position anterior referenced using the C-clamp to set rotation.  The   anterior, posterior, and  chamfer cuts were made without difficulty nor   notching making certain that I was along the anterior cortex to help   with flexion gap stability.      The final box cut was made off the lateral aspect of distal femur.      At this point, the tibia was sized to be a size 4.  The size 4 tray was   then pinned in position through the medial third of the tubercle,   drilled, and keel punched.  Trial reduction was now carried with a 4 femur,  4 tibia, a size 6 mm CR insert, and the 32 anatomic patella botton.  The knee was brought to full extension with good flexion stability with the patella   tracking through the trochlea without application of pressure.  Given   all these findings the trial components removed.  Final components were   opened and cement was mixed.  The knee was irrigated with normal saline solution and pulse lavage.  The synovial lining was   then injected with 30 cc of 0.25% Marcaine with epinephrine, 1 cc of Toradol and 30 cc of NS for a total of 61 cc.     Final implants were then cemented onto cleaned and dried cut surfaces of bone with the knee brought to extension with a size 6 mm CR trial insert.      Once the cement had fully cured, excess cement was removed   throughout the knee.  I confirmed that I was satisfied with the range of   motion and stability, and the final size 6 mm CR MS AOX insert was chosen.  It was   placed into the knee.    At this point in the case there was no significant   hemostasis was required.  The extensor mechanism was then reapproximated using #1 Vicryl and #1 Stratafix sutures with the knee   in flexion.  The   remaining wound was closed with 2-0 Vicryl and running 4-0 Monocryl.   The knee was cleaned, dried, dressed  sterilely using Dermabond and   Aquacel dressing.  The patient was then   brought to recovery room in stable  condition, tolerating the procedure   well.   Please note that Physician Assistant, Rosina Calin, PA-C was present for the entirety of the case, and was utilized for pre-operative positioning, peri-operative retractor management, general facilitation of the procedure and for primary wound closure at the end of the case.              Donnice CORDOBA Ernie, M.D.    06/04/2024 1:34 PM

## 2024-06-05 ENCOUNTER — Encounter (HOSPITAL_COMMUNITY): Payer: Self-pay | Admitting: Orthopedic Surgery

## 2024-06-05 ENCOUNTER — Other Ambulatory Visit (HOSPITAL_COMMUNITY): Payer: Self-pay

## 2024-06-05 DIAGNOSIS — M1712 Unilateral primary osteoarthritis, left knee: Secondary | ICD-10-CM | POA: Diagnosis not present

## 2024-06-05 LAB — BASIC METABOLIC PANEL WITH GFR
Anion gap: 9 (ref 5–15)
BUN: 24 mg/dL — ABNORMAL HIGH (ref 8–23)
CO2: 23 mmol/L (ref 22–32)
Calcium: 8.7 mg/dL — ABNORMAL LOW (ref 8.9–10.3)
Chloride: 100 mmol/L (ref 98–111)
Creatinine, Ser: 1.43 mg/dL — ABNORMAL HIGH (ref 0.44–1.00)
GFR, Estimated: 37 mL/min — ABNORMAL LOW (ref >60)
Glucose, Bld: 117 mg/dL — ABNORMAL HIGH (ref 70–99)
Potassium: 4.8 mmol/L (ref 3.5–5.1)
Sodium: 131 mmol/L — ABNORMAL LOW (ref 135–145)

## 2024-06-05 LAB — CBC
HCT: 29.8 % — ABNORMAL LOW (ref 36.0–46.0)
Hemoglobin: 9.9 g/dL — ABNORMAL LOW (ref 12.0–15.0)
MCH: 30.3 pg (ref 26.0–34.0)
MCHC: 33.2 g/dL (ref 30.0–36.0)
MCV: 91.1 fL (ref 80.0–100.0)
Platelets: 195 K/uL (ref 150–400)
RBC: 3.27 MIL/uL — ABNORMAL LOW (ref 3.87–5.11)
RDW: 13 % (ref 11.5–15.5)
WBC: 10.3 K/uL (ref 4.0–10.5)
nRBC: 0 % (ref 0.0–0.2)

## 2024-06-05 MED ORDER — SENNA 8.6 MG PO TABS
2.0000 | ORAL_TABLET | Freq: Every day | ORAL | 0 refills | Status: AC
  Filled 2024-06-05: qty 28, 14d supply, fill #0

## 2024-06-05 MED ORDER — ACETAMINOPHEN 500 MG PO TABS: 1000.0000 mg | ORAL_TABLET | Freq: Four times a day (QID) | ORAL | Status: AC

## 2024-06-05 MED ORDER — POLYETHYLENE GLYCOL 3350 17 GM/SCOOP PO POWD
17.0000 g | Freq: Two times a day (BID) | ORAL | 0 refills | Status: AC
  Filled 2024-06-05: qty 238, 7d supply, fill #0

## 2024-06-05 MED ORDER — OXYCODONE HCL 5 MG PO TABS
5.0000 mg | ORAL_TABLET | ORAL | 0 refills | Status: AC | PRN
  Filled 2024-06-05: qty 42, 7d supply, fill #0

## 2024-06-05 MED ORDER — METHOCARBAMOL 500 MG PO TABS
500.0000 mg | ORAL_TABLET | Freq: Four times a day (QID) | ORAL | 0 refills | Status: AC | PRN
  Filled 2024-06-05: qty 40, 10d supply, fill #0

## 2024-06-05 MED ORDER — ASPIRIN 81 MG PO CHEW
81.0000 mg | CHEWABLE_TABLET | Freq: Two times a day (BID) | ORAL | 0 refills | Status: AC
  Filled 2024-06-05: qty 56, 28d supply, fill #0

## 2024-06-05 NOTE — Progress Notes (Signed)
 Discharge medications delivered to patient at the bedside in a secure bag.

## 2024-06-05 NOTE — Evaluation (Signed)
 Physical Therapy Evaluation Patient Details Name: Mary Graves MRN: [id number] DOB: 07/03/1946 Today's Date: 06/05/2024  History of Present Illness  78 yo female s/p L TKA on 06/04/24. PMH: PTSD, HLD, ETOH use, HTN, MD  Clinical Impression  Pt is s/p TKA resulting in the deficits listed below (see PT Problem List).  Pt states her pain meds have worn off, 2 hrs post meds. RN updated. Pt amb ~ 56' with RW and reviewed TKA HEP. Reviewed car transfers, knee precautions, use of ice/ice machine, seated positioning of LLE with pt and sister; pt and sister with multiple questions regarding post op plan.  Will see for a second session to review stairs, mobility,etc. - pt will likely be ready to d/c after pm session.  Pt will benefit from acute skilled PT to increase their independence and safety with mobility to allow discharge.          If plan is discharge home, recommend the following: A little help with walking and/or transfers;A little help with bathing/dressing/bathroom;Help with stairs or ramp for entrance;Assist for transportation   Can travel by private vehicle        Equipment Recommendations None recommended by PT  Recommendations for Other Services       Functional Status Assessment Patient has had a recent decline in their functional status and demonstrates the ability to make significant improvements in function in a reasonable and predictable amount of time.     Precautions / Restrictions Precautions Precautions: Fall;Knee Restrictions LLE Weight Bearing Per Provider Order: Weight bearing as tolerated      Mobility  Bed Mobility Overal bed mobility: Needs Assistance Bed Mobility: Supine to Sit     Supine to sit: Min assist, Contact guard     General bed mobility comments: initially min assist for LLE, pt able to self progress using leg lifter after intiation    Transfers Overall transfer level: Needs assistance Equipment used: Rolling walker (2  wheels) Transfers: Sit to/from Stand Sit to Stand: Min assist                Ambulation/Gait Ambulation/Gait assistance: Min Chemical Engineer (Feet): 100 Feet (10' more) Assistive device: Rolling walker (2 wheels) Gait Pattern/deviations: Step-to pattern, Decreased stance time - left, Antalgic       General Gait Details: cues for sequence, RW position, step length and use of UEs to off load LLE for improved pain control  Stairs            Wheelchair Mobility     Tilt Bed    Modified Rankin (Stroke Patients Only)       Balance                                             Pertinent Vitals/Pain Pain Assessment Pain Assessment: 0-10 Pain Score: 5  Pain Location: L knee Pain Descriptors / Indicators: Guarding, Sore Pain Intervention(s): Limited activity within patient's tolerance, Monitored during session, Premedicated before session, Repositioned, Ice applied    Home Living Family/patient expects to be discharged to:: Private residence Living Arrangements: Alone Available Help at Discharge: Family Type of Home: Other(Comment) (towhhouse) Home Access: Level entry;Stairs to enter   Entrance Stairs-Number of Steps: 3 with one rail and 1 step no rails   Home Layout: One level Home Equipment: Agricultural Consultant (2 wheels);Cane - single point Additional Comments: ice machine, bone foam  Prior Function Prior Level of Function : Independent/Modified Independent             Mobility Comments: independent ADLs Comments: independent     Extremity/Trunk Assessment   Upper Extremity Assessment Upper Extremity Assessment: Overall WFL for tasks assessed    Lower Extremity Assessment Lower Extremity Assessment: LLE deficits/detail LLE Deficits / Details: ankle WFL, quads 2+/5, hip flexors 3+/5. limited by anticipated post op deficits       Communication   Communication Communication: No apparent difficulties    Cognition  Arousal: Alert Behavior During Therapy: WFL for tasks assessed/performed   PT - Cognitive impairments: No apparent impairments                         Following commands: Intact       Cueing Cueing Techniques: Verbal cues     General Comments      Exercises Total Joint Exercises Ankle Circles/Pumps: AROM, Both, 10 reps Quad Sets: 5 reps, Both, AROM Heel Slides: AAROM, Left, 5 reps Straight Leg Raises: AROM, Left, 5 reps   Assessment/Plan    PT Assessment Patient needs continued PT services  PT Problem List Decreased strength;Decreased range of motion;Decreased activity tolerance;Decreased balance;Decreased knowledge of use of DME;Decreased mobility;Decreased safety awareness;Decreased knowledge of precautions       PT Treatment Interventions DME instruction;Gait training;Functional mobility training;Therapeutic activities;Patient/family education;Therapeutic exercise;Stair training    PT Goals (Current goals can be found in the Care Plan section)  Acute Rehab PT Goals PT Goal Formulation: With patient Time For Goal Achievement: 06/05/24 Potential to Achieve Goals: Good    Frequency 7X/week     Co-evaluation               AM-PAC PT 6 Clicks Mobility  Outcome Measure Help needed turning from your back to your side while in a flat bed without using bedrails?: A Little Help needed moving from lying on your back to sitting on the side of a flat bed without using bedrails?: A Little   Help needed standing up from a chair using your arms (e.g., wheelchair or bedside chair)?: A Little Help needed to walk in hospital room?: A Little Help needed climbing 3-5 steps with a railing? : A Little 6 Click Score: 15    End of Session Equipment Utilized During Treatment: Gait belt Activity Tolerance: Patient tolerated treatment well Patient left: in chair;with chair alarm set;with call bell/phone within reach;with family/visitor present Nurse Communication:  Mobility status PT Visit Diagnosis: Other abnormalities of gait and mobility (R26.89)    Time: 8941-8865 PT Time Calculation (min) (ACUTE ONLY): 36 min   Charges:   PT Evaluation $PT Eval Low Complexity: 1 Low PT Treatments $Gait Training: 8-22 mins PT General Charges $$ ACUTE PT VISIT: 1 Visit         Mary Graves, PT  Acute Rehab Dept [facility]) [phone]  06/05/2024   [facility] 06/05/2024, 11:44 AM

## 2024-06-05 NOTE — Progress Notes (Signed)
   Subjective: 1 Day Post-Op Procedure(s) (LRB): ARTHROPLASTY, KNEE, TOTAL (Left) Patient reports pain as mild.   Patient seen in rounds with Dr. Ernie. Patient is well, and has had no acute complaints or problems. No acute events overnight. Foley catheter removed. Patient did not ambulate with PT yet.  We will start therapy today.   Objective: Vital signs in last 24 hours: Temp:  [97.4 F (36.3 C)-98.9 F (37.2 C)] 98.9 F (37.2 C) (11/05 9385) Pulse Rate:  [52-80] 64 (11/05 0614) Resp:  [10-22] 18 (11/05 0614) BP: (126-184)/(61-101) 142/69 (11/05 0614) SpO2:  [98 %-100 %] 99 % (11/05 0614) Weight:  [56.2 kg] 56.2 kg (11/04 1207)  Intake/Output from previous day:  Intake/Output Summary (Last 24 hours) at 06/05/2024 0837 Last data filed at 06/05/2024 0837 Gross per 24 hour  Intake 2926.14 ml  Output 1150 ml  Net 1776.14 ml     Intake/Output this shift: Total I/O In: 340 [P.O.:340] Out: -   Labs: Recent Labs    06/05/24 0349  HGB 9.9*   Recent Labs    06/05/24 0349  WBC 10.3  RBC 3.27*  HCT 29.8*  PLT 195   Recent Labs    06/05/24 0349  NA 131*  K 4.8  CL 100  CO2 23  BUN 24*  CREATININE 1.43*  GLUCOSE 117*  CALCIUM  8.7*   No results for input(s): LABPT, INR in the last 72 hours.  Exam: General - Patient is Alert and Oriented Extremity - Neurologically intact Sensation intact distally Intact pulses distally Dorsiflexion/Plantar flexion intact Dressing - dressing C/D/I Motor Function - intact, moving foot and toes well on exam.   Past Medical History:  Diagnosis Date   Alcoholism (HCC)    Cancer of uvula (HCC)    2021   Heart murmur    Hypertension    Hypothyroidism     Assessment/Plan: 1 Day Post-Op Procedure(s) (LRB): ARTHROPLASTY, KNEE, TOTAL (Left) Principal Problem:   S/P total knee arthroplasty, left  Estimated body mass index is 20.01 kg/m as calculated from the following:   Height as of this encounter: 5' 6 (1.676  m).   Weight as of this encounter: 56.2 kg. Advance diet   Patient's anticipated LOS is less than 2 midnights, meeting these requirements: - Younger than 62 - Lives within 1 hour of care - Has a competent adult at home to recover with post-op recover - NO history of  - Chronic pain requiring opiods  - Diabetes  - Coronary Artery Disease  - Heart failure  - Heart attack  - Stroke  - DVT/VTE  - Cardiac arrhythmia  - Respiratory Failure/COPD  - Renal failure  - Anemia  - Advanced Liver disease     DVT Prophylaxis - Aspirin  Weight bearing as tolerated.  Hgb stable at 9.9 this AM.  Plan is to go Home after hospital stay. Plan for discharge today following 1-2 sessions of PT as long as they are meeting their goals. Patient is scheduled for OPPT. Follow up in the office in 2 weeks.   Rosina Calin, PA-C Orthopedic Surgery [phone] 06/05/2024, 8:37 AM

## 2024-06-05 NOTE — TOC Transition Note (Signed)
 Transition of Care [facility]) - Discharge Note   Patient Details  Name: Mary Graves MRN: [id number] Date of Birth: November 12, 1945  Transition of Care [facility]) CM/SW Contact:  Alfonse JONELLE Rex, RN Phone Number: 06/05/2024, 2:26 PM   Clinical Narrative:  Met with patient at bedside to review dc therapy and home equipment needs, pt reports she has a RW, OPPT-[facility]. No TOC needs.      Final next level of care: OP Rehab Barriers to Discharge: No Barriers Identified   Patient Goals and CMS Choice Patient states their goals for this hospitalization and ongoing recovery are:: return home          Discharge Placement                       Discharge Plan and Services Additional resources added to the After Visit Summary for                  DME Arranged: N/A DME Agency: NA                  Social Drivers of Health (SDOH) Interventions SDOH Screenings   Food Insecurity: No Food Insecurity (06/04/2024)  Housing: Low Risk  (06/04/2024)  Transportation Needs: No Transportation Needs (06/04/2024)  Utilities: Not At Risk (06/04/2024)  Social Connections: Moderately Isolated (06/04/2024)  Tobacco Use: Medium Risk (06/04/2024)     Readmission Risk Interventions     No data to display

## 2024-06-05 NOTE — Progress Notes (Signed)
 Physical Therapy Treatment Patient Details Name: Mary Graves MRN: [id number] DOB: 06/13/1946 Today's Date: 06/05/2024   History of Present Illness 78 yo female s/p L TKA on 06/04/24. PMH: PTSD, HLD, ETOH use, HTN, MD    PT Comments  Pt expresses that she is frustrated and displeased with timing of pain meds and PT; PT and RN collaborated with timing of second session pt was premedicated with tramadol and robaxin prior to PT session. PT offered to come back later after pt is allowed to have oxy. Pt reports pain is minimal/tolerable  currently and states NO! I do not want you to come back. Proceeded with session, reviewed gait, transfer  safety, use of gait belt and stair training with pt and sister. Goals have been met. Pt is ready to d/c from PT standpoint with sister assisting prn. RN present EOS   If plan is discharge home, recommend the following: A little help with walking and/or transfers;A little help with bathing/dressing/bathroom;Help with stairs or ramp for entrance;Assist for transportation   Can travel by private vehicle        Equipment Recommendations  None recommended by PT    Recommendations for Other Services       Precautions / Restrictions Precautions Precautions: Fall;Knee Recall of Precautions/Restrictions: Intact Restrictions Weight Bearing Restrictions Per Provider Order: No LLE Weight Bearing Per Provider Order: Weight bearing as tolerated     Mobility  Bed Mobility Overal bed mobility: Needs Assistance Bed Mobility: Supine to Sit     Supine to sit: Min assist, Contact guard     General bed mobility comments: pt in recliner    Transfers Overall transfer level: Needs assistance Equipment used: Rolling walker (2 wheels) Transfers: Sit to/from Stand Sit to Stand: Contact guard assist           General transfer comment: cues for hand placement and LLE position    Ambulation/Gait Ambulation/Gait assistance: Contact guard assist Gait  Distance (Feet): 50 Feet (+10') Assistive device: Rolling walker (2 wheels) Gait Pattern/deviations: Step-to pattern, Decreased stance time - left       General Gait Details: cues for sequence, RW position, step length and use of UEs to off load LLE for improved pain control   Stairs Stairs: Yes Stairs assistance: Contact guard assist Stair Management: One rail Right, Step to pattern, Sideways Number of Stairs: 2 (up 2/down 3) General stair comments: cues for sequence and technique. CGA for safety. no LOB. sister present and able to assist   Wheelchair Mobility     Tilt Bed    Modified Rankin (Stroke Patients Only)       Balance           Standing balance support: Bilateral upper extremity supported, During functional activity Standing balance-Leahy Scale: Fair                              Hotel Manager: No apparent difficulties  Cognition Arousal: Alert Behavior During Therapy: Flat affect   PT - Cognitive impairments: No apparent impairments                         Following commands: Intact      Cueing Cueing Techniques: Verbal cues  Exercises Total Joint Exercises Ankle Circles/Pumps: AROM, Both, 10 reps Quad Sets: 5 reps, Both, AROM Heel Slides: AAROM, Left, 5 reps Straight Leg Raises: AROM, Left, 5 reps    General Comments  Pertinent Vitals/Pain Pain Assessment Pain Assessment: Faces Pain Score: 5  Faces Pain Scale: Hurts a little bit Pain Location: L knee Pain Descriptors / Indicators: Guarding, Sore Pain Intervention(s): Limited activity within patient's tolerance, Monitored during session, Premedicated before session, Repositioned    Home Living Family/patient expects to be discharged to:: Private residence Living Arrangements: Alone Available Help at Discharge: Family Type of Home: Other(Comment) (towhhouse) Home Access: Level entry;Stairs to enter   Entrance Stairs-Number of  Steps: 3 with one rail and 1 step no rails   Home Layout: One level Home Equipment: Agricultural Consultant (2 wheels);Cane - single point Additional Comments: ice machine, bone foam    Prior Function            PT Goals (current goals can now be found in the care plan section) Acute Rehab PT Goals Patient Stated Goal: to go home PT Goal Formulation: With patient Time For Goal Achievement: 06/05/24 Potential to Achieve Goals: Good Progress towards PT goals: Progressing toward goals    Frequency    7X/week      PT Plan      Co-evaluation              AM-PAC PT 6 Clicks Mobility   Outcome Measure  Help needed turning from your back to your side while in a flat bed without using bedrails?: A Little Help needed moving from lying on your back to sitting on the side of a flat bed without using bedrails?: A Little Help needed moving to and from a bed to a chair (including a wheelchair)?: None Help needed standing up from a chair using your arms (e.g., wheelchair or bedside chair)?: A Little Help needed to walk in hospital room?: A Little Help needed climbing 3-5 steps with a railing? : A Little 6 Click Score: 19    End of Session Equipment Utilized During Treatment: Gait belt Activity Tolerance: Patient tolerated treatment well Patient left: in chair;with chair alarm set;with call bell/phone within reach;with family/visitor present;with nursing/sitter in room Nurse Communication: Mobility status PT Visit Diagnosis: Other abnormalities of gait and mobility (R26.89)     Time: 8642-8581 PT Time Calculation (min) (ACUTE ONLY): 21 min  Charges:    $Gait Training: 8-22 mins PT General Charges $$ ACUTE PT VISIT: 1 Visit                     Rexene, PT  Acute Rehab Dept ([facility]) [phone]  06/05/2024    [facility] 06/05/2024, 2:28 PM

## 2024-06-05 NOTE — Plan of Care (Signed)
   Problem: Coping: Goal: Level of anxiety will decrease Outcome: Progressing   Problem: Pain Managment: Goal: General experience of comfort will improve and/or be controlled Outcome: Progressing   Problem: Safety: Goal: Ability to remain free from injury will improve Outcome: Progressing

## 2024-06-06 ENCOUNTER — Other Ambulatory Visit (HOSPITAL_COMMUNITY): Payer: Self-pay

## 2024-06-06 MED ORDER — HYDROMORPHONE HCL 2 MG PO TABS
ORAL_TABLET | ORAL | 0 refills | Status: AC
  Filled 2024-06-06: qty 42, 7d supply, fill #0

## 2024-06-07 ENCOUNTER — Ambulatory Visit: Admitting: Physical Therapy

## 2024-06-07 NOTE — Therapy (Incomplete)
 OUTPATIENT PHYSICAL THERAPY LOWER EXTREMITY EVALUATION   Patient Name: Mary Graves MRN: [id number] DOB:1945/11/28, 78 y.o., female Today's Date: 06/07/2024  END OF SESSION:   Past Medical History:  Diagnosis Date   Alcoholism (HCC)    Cancer of uvula (HCC)    2021   Heart murmur    Hypertension    Hypothyroidism    Past Surgical History:  Procedure Laterality Date   COLONOSCOPY     KNEE SURGERY Left    x 2   TONSILLECTOMY     TOTAL KNEE ARTHROPLASTY Left 06/04/2024   Procedure: ARTHROPLASTY, KNEE, TOTAL;  Surgeon: Ernie Cough, MD;  Location: [facility];  Service: Orthopedics;  Laterality: Left;   Patient Active Problem List   Diagnosis Date Noted   S/P total knee arthroplasty, left 06/04/2024   Elevated troponin 07/11/2014   Syncope 07/11/2014   PTSD (post-traumatic stress disorder) 10/31/2013   Alcoholism in family 10/31/2013   Dementia (HCC) 10/09/2013   HLD (hyperlipidemia) 02/27/2013   Osteopenia 02/27/2013   Alcohol  dependence (HCC) 06/01/2012   Major depression 06/01/2012   Hypothyroidism 02/23/2009   Essential hypertension 02/23/2009   HYPERGLYCEMIA 02/23/2009    PCP: Aisha Harvey MD  REFERRING PROVIDER: Ernie Cough MD  REFERRING DIAG: [phone] presence of left artificial knee joint  THERAPY DIAG:  Left knee pain; left knee stiffness Rationale for Evaluation and Treatment: Rehabilitation  ONSET DATE: surgery 06/04/24  SUBJECTIVE:   SUBJECTIVE STATEMENT: TKR 06/04/24  PERTINENT HISTORY: HTN; osteopenia; depression  PAIN:   Are you having pain? Yes NPRS scale: ***/10 Pain location: *** Pain orientation: {Pain Orientation:25161}  PAIN TYPE: {type:313116} Pain description: {PAIN DESCRIPTION:21022940}  Aggravating factors: *** Relieving factors: ***  PRECAUTIONS: None     WEIGHT BEARING RESTRICTIONS: No  FALLS:  Has patient fallen in last 6 months? No  LIVING ENVIRONMENT: Lives with: {OPRC lives with:25569::lives with their  family} Lives in: {Lives in:25570} Stairs: {opstairs:27293} Has following equipment at home: {Assistive devices:23999}  OCCUPATION: ***  PLOF: {PLOF:24004}  PATIENT GOALS: ***  NEXT MD VISIT: ***  OBJECTIVE:  Note: Objective measures were completed at Evaluation unless otherwise noted.  DIAGNOSTIC FINDINGS: OA left knee  PATIENT SURVEYS:  LEFS  Extreme difficulty/unable (0), Quite a bit of difficulty (1), Moderate difficulty (2), Little difficulty (3), No difficulty (4) Survey date:    11/7  Any of your usual work, housework or school activities   2. Usual hobbies, recreational or sporting activities   3. Getting into/out of the bath   4. Walking between rooms   5. Putting on socks/shoes   6. Squatting    7. Lifting an object, like a bag of groceries from the floor   8. Performing light activities around your home   9. Performing heavy activities around your home   10. Getting into/out of a car   11. Walking 2 blocks   12. Walking 1 mile   13. Going up/down 10 stairs (1 flight)   14. Standing for 1 hour   15.  sitting for 1 hour   16. Running on even ground   17. Running on uneven ground   18. Making sharp turns while running fast   19. Hopping    20. Rolling over in bed   Score total:  ***     COGNITION: Overall cognitive status: Within functional limits for tasks assessed     SENSATION: {sensation:27233}  EDEMA:  {edema:24020}   LOWER EXTREMITY ROM:  Active ROM Right eval Left eval  Hip flexion  Hip extension    Hip abduction    Hip adduction    Hip internal rotation    Hip external rotation    Knee flexion    Knee extension    Ankle dorsiflexion    Ankle plantarflexion    Ankle inversion    Ankle eversion     (Blank rows = not tested)  LOWER EXTREMITY MMT:  MMT Right eval Left eval  Hip flexion    Hip extension    Hip abduction    Hip adduction    Hip internal rotation    Hip external rotation    Knee flexion    Knee  extension    Ankle dorsiflexion    Ankle plantarflexion    Ankle inversion    Ankle eversion     (Blank rows = not tested)   FUNCTIONAL TESTS:  {Functional tests:24029}  GAIT: Distance walked: *** Assistive device utilized: {Assistive devices:23999} Level of assistance: {Levels of assistance:24026} Comments: ***                                                                                                                                TREATMENT DATE: 06/07/24 evaluation    PATIENT EDUCATION:  Education details: Educated patient on anatomy and physiology of current symptoms, prognosis, plan of care as well as initial self care strategies to promote recovery Person educated: Patient Education method: Explanation Education comprehension: verbalized understanding  HOME EXERCISE PROGRAM: ***  ASSESSMENT:  CLINICAL IMPRESSION: Patient is a 78 y.o. female who was seen today for physical therapy evaluation and treatment for left TKR. The patient demonstrates decreased knee flexion and extension range of motion as well as patellar hypmobility in all directions.  Strength deficits and asymmetry with opposite knee include decreased quad strength and gluteal strength particularly glute medius muscle with obvious pelvic drop noted in standing and during gait.  Decreased muscle length noted as well in hip flexors and hamstrings.  These deficits and pain cause functional impairments with standing, walking, going up and down curbs and steps at home and in the community.     OBJECTIVE IMPAIRMENTS: decreased activity tolerance, decreased balance, decreased mobility, difficulty walking, decreased ROM, decreased strength, increased edema, impaired perceived functional ability, and pain.   ACTIVITY LIMITATIONS: bending, standing, squatting, stairs, transfers, bathing, toileting, dressing, hygiene/grooming, and locomotion level  PARTICIPATION LIMITATIONS: meal prep, cleaning, laundry, driving,  shopping, and community activity  PERSONAL FACTORS: 1-2 comorbidities: HTN are also affecting patient's functional outcome.   REHAB POTENTIAL: Good  CLINICAL DECISION MAKING: Stable/uncomplicated  EVALUATION COMPLEXITY: Low   GOALS: Goals reviewed with patient? Yes  SHORT TERM GOALS: Target date: 07/05/2024   The patient will demonstrate knowledge of basic self care strategies and exercises to promote healing  Baseline: Goal status: INITIAL  2.  Knee flexion ROM improved to      degrees needed for greater ease getting in/out of the car Baseline:  Goal status: INITIAL  3.  *** Baseline:  Goal status: INITIAL  4.  *** Baseline:  Goal status: INITIAL  5.  *** Baseline:  Goal status: INITIAL   LONG TERM GOALS: Target date: 08/02/2024    The patient will be independent in a safe self progression of a home exercise program to promote further recovery of function  Baseline:  Goal status: INITIAL  2.  The patient will report a 60% improvement in pain levels with functional activities which are currently difficult including  Baseline:  Goal status: INITIAL  3.  The patient will have improved LE strength of at least 4+/5 needed to ascend and descend steps reciprocally  Baseline:  Goal status: INITIAL  4.  *** Baseline:  Goal status: INITIAL  5.  LEFS functional outcome score improved to     /80 indicating improved function with less pain Baseline:  Goal status: INITIAL    PLAN:  PT FREQUENCY: 3x/week  PT DURATION: 8 weeks  PLANNED INTERVENTIONS: 97164- PT Re-evaluation, 97750- Physical Performance Testing, 97110-Therapeutic exercises, 97530- Therapeutic activity, 97112- Neuromuscular re-education, 97535- Self Care, 02859- Manual therapy, [id number]- Aquatic Therapy, H9716- Electrical stimulation (unattended), [phone]- Electrical stimulation (manual), 97016- Vasopneumatic device, Patient/Family education, Balance training, Stair training, Taping, Joint mobilization,  Cryotherapy, and Moist heat  PLAN FOR NEXT SESSION: knee ROM flexion and extension; LE strengthening, manual techniques, vasocompression

## 2024-06-10 ENCOUNTER — Other Ambulatory Visit: Payer: Self-pay

## 2024-06-10 ENCOUNTER — Encounter: Payer: Self-pay | Admitting: Physical Therapy

## 2024-06-10 ENCOUNTER — Ambulatory Visit: Attending: Family Medicine | Admitting: Physical Therapy

## 2024-06-10 DIAGNOSIS — M25562 Pain in left knee: Secondary | ICD-10-CM | POA: Diagnosis not present

## 2024-06-10 DIAGNOSIS — R2689 Other abnormalities of gait and mobility: Secondary | ICD-10-CM | POA: Diagnosis not present

## 2024-06-10 DIAGNOSIS — M6281 Muscle weakness (generalized): Secondary | ICD-10-CM | POA: Insufficient documentation

## 2024-06-10 DIAGNOSIS — Z96652 Presence of left artificial knee joint: Secondary | ICD-10-CM | POA: Diagnosis not present

## 2024-06-10 DIAGNOSIS — R600 Localized edema: Secondary | ICD-10-CM | POA: Insufficient documentation

## 2024-06-10 NOTE — Therapy (Signed)
 OUTPATIENT PHYSICAL THERAPY LOWER EXTREMITY EVALUATION   Patient Name: Mary Graves MRN: [id number] DOB:Oct 20, 1945, 78 y.o., female Today's Date: 06/10/2024  END OF SESSION:  PT End of Session - 06/10/24 1347     Visit Number 1    Date for Recertification  08/05/24    Authorization Type [employer]    Progress Note Due on Visit 10    PT Start Time 1015    PT Stop Time 1100    PT Time Calculation (min) 45 min    Activity Tolerance Other (comment);Patient limited by pain   limited by weakness   Behavior During Therapy [facility] for tasks assessed/performed          Past Medical History:  Diagnosis Date   Alcoholism (HCC)    Cancer of uvula (HCC)    2021   Heart murmur    Hypertension    Hypothyroidism    Past Surgical History:  Procedure Laterality Date   COLONOSCOPY     KNEE SURGERY Left    x 2   TONSILLECTOMY     TOTAL KNEE ARTHROPLASTY Left 06/04/2024   Procedure: ARTHROPLASTY, KNEE, TOTAL;  Surgeon: Ernie Cough, MD;  Location: [facility];  Service: Orthopedics;  Laterality: Left;   Patient Active Problem List   Diagnosis Date Noted   S/P total knee arthroplasty, left 06/04/2024   Elevated troponin 07/11/2014   Syncope 07/11/2014   PTSD (post-traumatic stress disorder) 10/31/2013   Alcoholism in family 10/31/2013   Dementia (HCC) 10/09/2013   HLD (hyperlipidemia) 02/27/2013   Osteopenia 02/27/2013   Alcohol  dependence (HCC) 06/01/2012   Major depression 06/01/2012   Hypothyroidism 02/23/2009   Essential hypertension 02/23/2009   HYPERGLYCEMIA 02/23/2009    PCP: Aisha Harvey MD  REFERRING PROVIDER: Ernie Cough MD  REFERRING DIAG: [id number] presence of left artificial knee joint  THERAPY DIAG:  Left knee pain; left knee stiffness Rationale for Evaluation and Treatment: Rehabilitation  ONSET DATE: surgery 06/04/24  SUBJECTIVE:   SUBJECTIVE STATEMENT: Pt referred to OPPT with Hx of Lt TKR 06/04/24.  I am not doing any exercises - I'm barely  making it to the toilet. Using a strap to help me lift and move my Lt leg. I have so much weakness in my surgical leg.  I had a nerve block into the thigh with the surgery. Prior to surgery I would do water walking at [employer] 5-7 days a week. When weather was good I would trail walk x3 miles. Has had vomiting with pain meds so using Dilaudid  and nausea meds.  MD wants me to change to Trazadone. Sleeping in the bed.  PERTINENT HISTORY: HTN; osteopenia; depression  PAIN:   Are you having pain? Yes NPRS scale: 0-9/10 Pain location: Left Pain orientation: Left  PAIN TYPE: aching Pain description: intermittent  Aggravating factors: moving Relieving factors: recliner with leg elevated  PRECAUTIONS: None     WEIGHT BEARING RESTRICTIONS: No  FALLS:  Has patient fallen in last 6 months? No  LIVING ENVIRONMENT: Lives with: lives alone but friend living with her now to help Lives in: House/apartment Stairs: Yes: External: 4 steps; on right going up Has following equipment at home: Single point cane and Environmental Consultant - 2 wheeled  OCCUPATION: retired but active doing service work Biochemist, Clinical)  PLOF: Independent  PATIENT GOALS: be able to travel on an airplane by [employer]  NEXT MD VISIT: 06/20/24  OBJECTIVE:  Note: Objective measures were completed at Evaluation unless otherwise noted.  DIAGNOSTIC FINDINGS: OA left knee  PATIENT SURVEYS:  The Patient-Specific Functional Scale  Initial:  I am going to ask you to identify up to 3 important activities that you are unable to do or are having difficulty with as a result of this problem.  Today are there any activities that you are unable to do or having difficulty with because of this?  (Patient shown scale and patient rated each activity)  Follow up: When you first came in you had difficulty performing these activities.  Today do you still have difficulty?  Patient-Specific activity scoring scheme (Point to one  number):  0 1 2 3 4 5 6 7 8 9  10 Unable                                                                                                          Able to perform To perform                                                                                                    activity at the same Activity         Level as before                                                                                                                       Injury or problem  Activity           Get in/out of bed                                                                      Initial:     2                  follow up:  2.        Get in/out of chair  Initial:      2                 follow up:  3.         Walk to bathroom                                                                  Initial:      2                 follow up:     COGNITION: Overall cognitive status: Within functional limits for tasks assessed     SENSATION: Anterior thigh numbness  EDEMA:  Circumferential: Rt 35cm, Lt 40.5cm with ACE bandage   LOWER EXTREMITY ROM:  Active ROM Right eval Left eval  Hip flexion    Hip extension    Hip abduction    Hip adduction    Hip internal rotation    Hip external rotation    Knee flexion  30 A/ROM 55 P/ROM - limited by pain in quad  Knee extension  -12  Ankle dorsiflexion    Ankle plantarflexion    Ankle inversion    Ankle eversion     (Blank rows = not tested)  LOWER EXTREMITY MMT: Significant weakness of Lt hip and knee - mild quad set activation on Lt Unable to move Lt leg without external support unless in anti-gravity position Lt hip 2+/5 Lt knee 2+/5   FUNCTIONAL TESTS:  Sit to stand: heavy use of bil UE on armrests, using primarily Rt LE and kicks Lt LE out Transfers: needs external support or max A to transfer Lt LE into bed/onto table  GAIT: Distance walked:  Assistive device utilized: Environmental Consultant - 2  wheeled Level of assistance: Modified independence Comments: ambulates with Lt LE straight                                                                                                                                 TREATMENT DATE:  06/10/24 Initiated HEP    PATIENT EDUCATION:  Education details: Educated patient on anatomy and physiology of current symptoms, prognosis, plan of care as well as initial self care strategies to promote recovery Person educated: Patient Education method: Explanation Education comprehension: verbalized understanding  HOME EXERCISE PROGRAM: Access Code: [email or web address] URL: [email or web address] Date: 06/10/2024 Prepared by: Orvil Fester  Exercises - Supine Quad Set  - 1 x daily - 7 x weekly - 2 sets - 10 reps - 3 sec hold - Supine Hip Abduction  - 1 x daily - 7 x weekly - 2 sets - 10 reps - Supine Knee Extension Stretch on Towel Roll  - 1 x daily - 7 x weekly -  3 sets - 10 reps - Seated Heel Slide  - 1 x daily - 7 x weekly - 3 sets - 10 reps - Sit to Stand with Armchair  - 1 x daily - 7 x weekly - 2 sets - 5 reps  ASSESSMENT:  CLINICAL IMPRESSION: Patient is a 78 y.o. female who was seen today for physical therapy evaluation and treatment for left TKR. Pt presents with significant weakness of Lt hip and knee requiring her to use external support to transfer Lt LE with bed transfers.  She ambulates with knee fully extended using a 2-wheel walker.  She has signif limitation in A/ROM of Lt knee measuring -12 to 30 deg, with P/ROM limited to 55 deg flexion secondary to pain. She did have a nerve block into Lt thigh with surgery so perhaps this could still be contributing to weakness.  If she presents with this level of weakness over the next week a touch point with MD is recommended. She has a friend helping her at home with all ADLs. Pt will benefit from skilled PT to address mobility, strength, ROM and functional transfers.  OBJECTIVE  IMPAIRMENTS: decreased activity tolerance, decreased balance, decreased mobility, difficulty walking, decreased ROM, decreased strength, increased edema, impaired perceived functional ability, and pain.   ACTIVITY LIMITATIONS: bending, standing, squatting, stairs, transfers, bathing, toileting, dressing, hygiene/grooming, and locomotion level  PARTICIPATION LIMITATIONS: meal prep, cleaning, laundry, driving, shopping, and community activity  PERSONAL FACTORS: 1-2 comorbidities: HTN are also affecting patient's functional outcome.   REHAB POTENTIAL: Good  CLINICAL DECISION MAKING: Stable/uncomplicated  EVALUATION COMPLEXITY: Low   GOALS: Goals reviewed with patient? Yes  SHORT TERM GOALS: Target date: 07/05/2024   The patient will demonstrate knowledge of basic self care strategies and exercises to promote healing  Baseline: Goal status: INITIAL  2.  Pt will be able to perform sit to stand with equal WB through bil LE with UE assist Baseline:  Goal status: INITIAL  3.  Pt will improve Lt knee A/ROM measured in supine to -4 to 90 deg Baseline:  Goal status: INITIAL  4.  Pt will demo symmetrical gait with walker  Baseline:  Goal status: INITIAL    LONG TERM GOALS: Target date: 08/02/2024    The patient will be independent in a safe self progression of a home exercise program to promote further recovery of function  Baseline:  Goal status: INITIAL  2.  The patient will report a 60% improvement in pain levels with functional activities which are currently difficult including transfers, stairs, and bed mobility Baseline:  Goal status: INITIAL  3.  The patient will have improved LE strength of at least 4+/5 needed to ascend and descend steps reciprocally  Baseline:  Goal status: INITIAL  4.  Pt will be able to perform household ambulation and short distance community ambulation with LRAD. Baseline:  Goal status: INITIAL      PLAN:  PT FREQUENCY: 3x/week  PT  DURATION: 8 weeks  PLANNED INTERVENTIONS: 97164- PT Re-evaluation, 97750- Physical Performance Testing, 97110-Therapeutic exercises, 97530- Therapeutic activity, [id number]- Neuromuscular re-education, 97535- Self Care, 02859- Manual therapy, [phone]- Aquatic Therapy, G0283- Electrical stimulation (unattended), [phone]- Electrical stimulation (manual), 97016- Vasopneumatic device, Patient/Family education, Balance training, Stair training, Taping, Joint mobilization, Cryotherapy, and Moist heat  PLAN FOR NEXT SESSION: monitor return of Lt hip and knee strength - alert MD if still needing external support for mobility, NuStep, gait training, knee ROM flexion and extension; LE strengthening, manual techniques, vasocompression   Squire Withey,  PT 06/10/24 2:11 PM

## 2024-06-12 ENCOUNTER — Encounter: Payer: Self-pay | Admitting: Rehabilitative and Restorative Service Providers"

## 2024-06-12 ENCOUNTER — Ambulatory Visit: Admitting: Rehabilitative and Restorative Service Providers"

## 2024-06-12 DIAGNOSIS — R2689 Other abnormalities of gait and mobility: Secondary | ICD-10-CM

## 2024-06-12 DIAGNOSIS — M6281 Muscle weakness (generalized): Secondary | ICD-10-CM

## 2024-06-12 DIAGNOSIS — R600 Localized edema: Secondary | ICD-10-CM

## 2024-06-12 DIAGNOSIS — M25562 Pain in left knee: Secondary | ICD-10-CM | POA: Diagnosis not present

## 2024-06-12 NOTE — Therapy (Signed)
 OUTPATIENT PHYSICAL THERAPY TREATMENT NOTE   Patient Name: Mary Graves MRN: [id number] DOB:1946-02-03, 78 y.o., female Today's Date: 06/12/2024  END OF SESSION:  PT End of Session - 06/12/24 1021     Visit Number 2    Date for Recertification  08/05/24    Authorization Type [employer]    Progress Note Due on Visit 10    PT Start Time 1015    PT Stop Time 1055    PT Time Calculation (min) 40 min    Activity Tolerance Patient tolerated treatment well    Behavior During Therapy [facility] for tasks assessed/performed          Past Medical History:  Diagnosis Date   Alcoholism (HCC)    Cancer of uvula (HCC)    2021   Heart murmur    Hypertension    Hypothyroidism    Past Surgical History:  Procedure Laterality Date   COLONOSCOPY     KNEE SURGERY Left    x 2   TONSILLECTOMY     TOTAL KNEE ARTHROPLASTY Left 06/04/2024   Procedure: ARTHROPLASTY, KNEE, TOTAL;  Surgeon: Ernie Cough, MD;  Location: [facility];  Service: Orthopedics;  Laterality: Left;   Patient Active Problem List   Diagnosis Date Noted   S/P total knee arthroplasty, left 06/04/2024   Elevated troponin 07/11/2014   Syncope 07/11/2014   PTSD (post-traumatic stress disorder) 10/31/2013   Alcoholism in family 10/31/2013   Dementia (HCC) 10/09/2013   HLD (hyperlipidemia) 02/27/2013   Osteopenia 02/27/2013   Alcohol  dependence (HCC) 06/01/2012   Major depression 06/01/2012   Hypothyroidism 02/23/2009   Essential hypertension 02/23/2009   HYPERGLYCEMIA 02/23/2009    PCP: Aisha Harvey MD  REFERRING PROVIDER: Ernie Cough MD  REFERRING DIAG: [id number] presence of left artificial knee joint  THERAPY DIAG:  Left knee pain; left knee stiffness Rationale for Evaluation and Treatment: Rehabilitation  ONSET DATE: surgery 06/04/24  SUBJECTIVE:   SUBJECTIVE STATEMENT: Pt reports that she is trying to wean off her medication.  States that she is starting to feel some sensation in her left leg now and  she is very encouraged by this.  PERTINENT HISTORY: Hx of Lt TKR 06/04/24 HTN; osteopenia; depression  Has [employer] membership  PAIN:   Are you having pain? Yes NPRS scale: 4/10 Pain location: Left Pain orientation: Left  PAIN TYPE: aching Pain description: intermittent  Aggravating factors: moving Relieving factors: recliner with leg elevated   PRECAUTIONS: None     WEIGHT BEARING RESTRICTIONS: No  FALLS:  Has patient fallen in last 6 months? No  LIVING ENVIRONMENT: Lives with: lives alone but friend living with her now to help Lives in: House/apartment Stairs: Yes: External: 4 steps; on right going up Has following equipment at home: Single point cane and Environmental Consultant - 2 wheeled  OCCUPATION: retired but active doing service work Biochemist, Clinical)  PLOF: Independent and Leisure: trail walking (3 miles)  PATIENT GOALS: be able to travel on an airplane by [employer]  NEXT MD VISIT: 06/20/24  OBJECTIVE:  Note: Objective measures were completed at Evaluation unless otherwise noted.  DIAGNOSTIC FINDINGS: OA left knee  PATIENT SURVEYS:  The Patient-Specific Functional Scale  Initial:  I am going to ask you to identify up to 3 important activities that you are unable to do or are having difficulty with as a result of this problem.  Today are there any activities that you are unable to do or having difficulty with because of this?  (Patient shown  scale and patient rated each activity)  Follow up: When you first came in you had difficulty performing these activities.  Today do you still have difficulty?  Patient-Specific activity scoring scheme (Point to one number):  0 1 2 3 4 5 6 7 8 9  10 Unable                                                                                                          Able to perform To perform                                                                                                    activity at the same Activity         Level as  before                                                                                                                       Injury or problem  Activity           Get in/out of bed                                                                      Initial:     2                  follow up:  2.        Get in/out of chair                                                             Initial:      2  follow up:  3.         Walk to bathroom                                                                  Initial:      2                 follow up:     COGNITION: Overall cognitive status: Within functional limits for tasks assessed     SENSATION: Anterior thigh numbness  EDEMA:  Circumferential: Rt 35cm, Lt 40.5cm with ACE bandage   LOWER EXTREMITY ROM:  Active ROM Right eval Left eval  Hip flexion    Hip extension    Hip abduction    Hip adduction    Hip internal rotation    Hip external rotation    Knee flexion  30 A/ROM 55 P/ROM - limited by pain in quad  Knee extension  -12  Ankle dorsiflexion    Ankle plantarflexion    Ankle inversion    Ankle eversion     (Blank rows = not tested)  LOWER EXTREMITY MMT: Significant weakness of Lt hip and knee - mild quad set activation on Lt Unable to move Lt leg without external support unless in anti-gravity position Lt hip 2+/5 Lt knee 2+/5   FUNCTIONAL TESTS:  Sit to stand: heavy use of bil UE on armrests, using primarily Rt LE and kicks Lt LE out Transfers: needs external support or max A to transfer Lt LE into bed/onto table  GAIT: Distance walked:  Assistive device utilized: Environmental Consultant - 2 wheeled Level of assistance: Modified independence Comments: ambulates with Lt LE straight                                                                                                                                 TREATMENT DATE:   06/12/2024: Nustep level 1 x6 min with PT present to discuss status Seated  heel/toe raises x20 Seated marching x20 bilat (cuing on right side to slow down for increased muscle fiber recruitment) Seated left heel slide with foot on a slider 2x10 Supine quad sets 2x10 bilat Supine heel slides with foot on slider and use of stretch strap 2x10 left LE Supine hip abduction with foot on slider and stretch strap 2x10 left LE Supine straight leg raise with assist from PT 3x5 Manual:  P/ROM to left knee for knee flexion/extension.  Education to sister on how to perform this.   06/10/24 Initiated HEP    PATIENT EDUCATION:  Education details: Educated patient on anatomy and physiology of current symptoms, prognosis, plan of care as well as initial self care strategies to promote recovery Person educated: Patient Education  method: Explanation Education comprehension: verbalized understanding  HOME EXERCISE PROGRAM: Access Code: [email or web address] URL: [email or web address] Date: 06/12/2024 Prepared by: Jarrell Susette Seminara  Exercises - Supine Quad Set  - 1 x daily - 7 x weekly - 2 sets - 10 reps - 3 sec hold - Supine Hip Abduction  - 1 x daily - 7 x weekly - 2 sets - 10 reps - Supine Knee Extension Stretch on Towel Roll  - 1 x daily - 7 x weekly - 3 sets - 10 reps - Seated Heel Slide  - 1 x daily - 7 x weekly - 3 sets - 10 reps - Supine Heel Slide with Strap  - 1 x daily - 7 x weekly - 2 sets - 10 reps - Small Range Straight Leg Raise  - 1 x daily - 7 x weekly - 2 sets - 10 reps - Sit to Stand with Armchair  - 1 x daily - 7 x weekly - 2 sets - 5 reps  ASSESSMENT:  CLINICAL IMPRESSION: Ms Lizer presents to skilled PT reporting that she is encouraged that she is starting to feel her left leg some and have a little movement.  Patient states that she called the MD and is scheduled to go see them today for assessment, but now she is not sure that she will go, as she is feeling better.  Patient able to progress with exercises during session.  Patient educated caregiver  on P/ROM and assistance with straight leg raise.  She verbalized understanding and was able to return demonstration.  Patient declines cold pack today, stating that she will use one when she returns home.  OBJECTIVE IMPAIRMENTS: decreased activity tolerance, decreased balance, decreased mobility, difficulty walking, decreased ROM, decreased strength, increased edema, impaired perceived functional ability, and pain.   ACTIVITY LIMITATIONS: bending, standing, squatting, stairs, transfers, bathing, toileting, dressing, hygiene/grooming, and locomotion level  PARTICIPATION LIMITATIONS: meal prep, cleaning, laundry, driving, shopping, and community activity  PERSONAL FACTORS: 1-2 comorbidities: HTN are also affecting patient's functional outcome.   REHAB POTENTIAL: Good  CLINICAL DECISION MAKING: Stable/uncomplicated  EVALUATION COMPLEXITY: Low   GOALS: Goals reviewed with patient? Yes  SHORT TERM GOALS: Target date: 07/05/2024   The patient will demonstrate knowledge of basic self care strategies and exercises to promote healing  Baseline: Goal status: Ongoing  2.  Pt will be able to perform sit to stand with equal WB through bil LE with UE assist Baseline:  Goal status: Ongoing  3.  Pt will improve Lt knee A/ROM measured in supine to -4 to 90 deg Baseline:  Goal status: INITIAL  4.  Pt will demo symmetrical gait with walker  Baseline:  Goal status: INITIAL    LONG TERM GOALS: Target date: 08/02/2024    The patient will be independent in a safe self progression of a home exercise program to promote further recovery of function  Baseline:  Goal status: INITIAL  2.  The patient will report a 60% improvement in pain levels with functional activities which are currently difficult including transfers, stairs, and bed mobility Baseline:  Goal status: INITIAL  3.  The patient will have improved LE strength of at least 4+/5 needed to ascend and descend steps reciprocally   Baseline:  Goal status: INITIAL  4.  Pt will be able to perform household ambulation and short distance community ambulation with LRAD. Baseline:  Goal status: INITIAL      PLAN:  PT FREQUENCY: 3x/week  PT DURATION: 8 weeks  PLANNED INTERVENTIONS:  02835- PT Re-evaluation, 97750- Physical Performance Testing, 97110-Therapeutic exercises, 97530- Therapeutic activity, [id number]- Neuromuscular re-education, [phone]- Self Care, 02859- Manual therapy, [phone]- Aquatic Therapy, [phone]- Electrical stimulation (unattended), [phone]- Electrical stimulation (manual), 97016- Vasopneumatic device, Patient/Family education, Balance training, Stair training, Taping, Joint mobilization, Cryotherapy, and Moist heat  PLAN FOR NEXT SESSION: monitor return of Lt hip and knee strength - alert MD if still needing external support for mobility, NuStep, gait training, knee ROM flexion and extension; LE strengthening, manual techniques, vasocompression   Jarrell Laming, PT, DPT 06/12/24, 11:23 AM  [facility] [address] [place], [place] [address] Phone # [phone] Fax [phone]

## 2024-06-13 NOTE — Discharge Summary (Signed)
 Patient ID: Mary Graves MRN: [id number] DOB/AGE: [birth year] 79 y.o.  Admit date: 06/04/2024 Discharge date: 06/05/2024  Admission Diagnoses:  Left knee osteoarthritis  Discharge Diagnoses:  Principal Problem:   S/P total knee arthroplasty, left   Past Medical History:  Diagnosis Date   Alcoholism (HCC)    Cancer of uvula (HCC)    2021   Heart murmur    Hypertension    Hypothyroidism     Surgeries: Procedure(s): ARTHROPLASTY, KNEE, TOTAL on 06/04/2024   Consultants:   Discharged Condition: Improved  Hospital Course: Taraoluwa Thakur is an 78 y.o. female who was admitted 06/04/2024 for operative treatment ofS/P total knee arthroplasty, left. Patient has severe unremitting pain that affects sleep, daily activities, and work/hobbies. After pre-op clearance the patient was taken to the operating room on 06/04/2024 and underwent  Procedure(s): ARTHROPLASTY, KNEE, TOTAL.    Patient was given perioperative antibiotics:  Anti-infectives (From admission, onward)    Start     Dose/Rate Route Frequency Ordered Stop   06/05/24 0600  ceFAZolin (ANCEF) IVPB 2g/100 mL premix        2 g 200 mL/hr over 30 Minutes Intravenous On call to O.R. 06/04/24 1202 06/05/24 0630   06/04/24 2200  ceFAZolin (ANCEF) IVPB 2g/100 mL premix        2 g 200 mL/hr over 30 Minutes Intravenous Every 6 hours 06/04/24 1934 06/05/24 0230        Patient was given sequential compression devices, early ambulation, and chemoprophylaxis to prevent DVT. Patient worked with PT and was meeting their goals regarding safe ambulation and transfers.  Patient benefited maximally from hospital stay and there were no complications.    Recent vital signs: No data found.   Recent laboratory studies: No results for input(s): WBC, HGB, HCT, PLT, NA, K, CL, CO2, BUN, CREATININE, GLUCOSE, INR, CALCIUM  in the last 72 hours.  Invalid input(s): PT, 2   Discharge Medications:   Allergies as of  06/05/2024       Reactions   Ace Inhibitors Cough   Folic Acid  Rash        Medication List     TAKE these medications    acetaminophen  500 MG tablet Commonly known as: TYLENOL  Take 2 tablets (1,000 mg total) by mouth every 6 (six) hours.   Aspirin  Low Dose 81 MG chewable tablet Generic drug: aspirin  Chew 1 tablet (81 mg total) by mouth 2 (two) times daily for 28 days.   CALCIUM  + D PO Take 1 tablet by mouth daily.   denosumab  60 MG/ML Sosy injection Commonly known as: PROLIA  Inject 60 mg into the skin every 6 (six) months.   levothyroxine  112 MCG tablet Commonly known as: SYNTHROID  Take 112 mcg by mouth daily before breakfast.   losartan 50 MG tablet Commonly known as: COZAAR Take 50 mg by mouth daily.   methocarbamol 500 MG tablet Commonly known as: ROBAXIN Take 1 tablet (500 mg total) by mouth every 6 (six) hours as needed for muscle spasms.   multivitamin tablet Take 1 tablet by mouth daily.   naphazoline-pheniramine 0.025-0.3 % ophthalmic solution Commonly known as: NAPHCON-A Place 1 drop into both eyes 4 (four) times daily as needed for eye irritation or allergies.   oxyCODONE  5 MG immediate release tablet Commonly known as: Oxy IR/ROXICODONE  Take 1 tablet (5 mg total) by mouth every 4 (four) hours as needed for severe pain (pain score 7-10).   polyethylene glycol powder 17 GM/SCOOP powder Commonly known as: GLYCOLAX/MIRALAX Dissolve 1 capful (17g)  in 4-8 ounces of liquid and take by mouth 2 (two) times daily.   Refresh Tears 0.5 % Soln Generic drug: carboxymethylcellulose Place 1 drop into both eyes as needed (dry eyes).   senna 8.6 MG Tabs tablet Commonly known as: SENOKOT Take 2 tablets (17.2 mg total) by mouth at bedtime for 14 days.   vitamin E 1000 UNIT capsule Take 2,000 Units by mouth daily.               Discharge Care Instructions  (From admission, onward)           Start     Ordered   06/05/24 0000  Change dressing        Comments: Maintain surgical dressing until follow up in the clinic. If the edges start to pull up, may reinforce with tape. If the dressing is no longer working, may remove and cover with gauze and tape, but must keep the area dry and clean.  Call with any questions or concerns.   06/05/24 0840            Diagnostic Studies: No results found.  Disposition: Discharge disposition: 01-Home or Self Care       Discharge Instructions     Call MD / Call 911   Complete by: As directed    If you experience chest pain or shortness of breath, CALL 911 and be transported to the hospital emergency room.  If you develope a fever above 101 F, pus (white drainage) or increased drainage or redness at the wound, or calf pain, call your surgeon's office.   Change dressing   Complete by: As directed    Maintain surgical dressing until follow up in the clinic. If the edges start to pull up, may reinforce with tape. If the dressing is no longer working, may remove and cover with gauze and tape, but must keep the area dry and clean.  Call with any questions or concerns.   Constipation Prevention   Complete by: As directed    Drink plenty of fluids.  Prune juice may be helpful.  You may use a stool softener, such as Colace (over the counter) 100 mg twice a day.  Use MiraLax (over the counter) for constipation as needed.   Diet - low sodium heart healthy   Complete by: As directed    Increase activity slowly as tolerated   Complete by: As directed    Weight bearing as tolerated with assist device (walker, cane, etc) as directed, use it as long as suggested by your surgeon or therapist, typically at least 4-6 weeks.   Post-operative opioid taper instructions:   Complete by: As directed    POST-OPERATIVE OPIOID TAPER INSTRUCTIONS: It is important to wean off of your opioid medication as soon as possible. If you do not need pain medication after your surgery it is ok to stop day one. Opioids  include: Codeine, Hydrocodone (Norco, Vicodin), Oxycodone (Percocet, oxycontin ) and hydromorphone  amongst others.  Long term and even short term use of opiods can cause: Increased pain response Dependence Constipation Depression Respiratory depression And more.  Withdrawal symptoms can include Flu like symptoms Nausea, vomiting And more Techniques to manage these symptoms Hydrate well Eat regular healthy meals Stay active Use relaxation techniques(deep breathing, meditating, yoga) Do Not substitute Alcohol  to help with tapering If you have been on opioids for less than two weeks and do not have pain than it is ok to stop all together.  Plan to wean off of opioids This  plan should start within one week post op of your joint replacement. Maintain the same interval or time between taking each dose and first decrease the dose.  Cut the total daily intake of opioids by one tablet each day Next start to increase the time between doses. The last dose that should be eliminated is the evening dose.      TED hose   Complete by: As directed    Use stockings (TED hose) for 2 weeks on both leg(s).  You may remove them at night for sleeping.        Follow-up Information     Patti Rosina SAUNDERS, PA-C. Go on 06/20/2024.   Specialty: Orthopedic Surgery Why: You are scheduled for a post op appointment on Thursday 06/20/24 at 4:00pm Contact information: [address] [place] [address] [phone]                  Signed: Rosina SAUNDERS Patti 06/13/2024, 6:50 AM

## 2024-06-14 ENCOUNTER — Ambulatory Visit: Admitting: Physical Therapy

## 2024-06-14 ENCOUNTER — Encounter: Payer: Self-pay | Admitting: Physical Therapy

## 2024-06-14 DIAGNOSIS — R600 Localized edema: Secondary | ICD-10-CM

## 2024-06-14 DIAGNOSIS — M25562 Pain in left knee: Secondary | ICD-10-CM | POA: Diagnosis not present

## 2024-06-14 DIAGNOSIS — M6281 Muscle weakness (generalized): Secondary | ICD-10-CM

## 2024-06-14 DIAGNOSIS — R2689 Other abnormalities of gait and mobility: Secondary | ICD-10-CM

## 2024-06-14 NOTE — Therapy (Signed)
 OUTPATIENT PHYSICAL THERAPY TREATMENT NOTE   Patient Name: Mary Graves MRN: [id number] DOB:01-Aug-1946, 78 y.o., female Today's Date: 06/14/2024  END OF SESSION:  PT End of Session - 06/14/24 1014     Visit Number 3    Date for Recertification  08/05/24    Authorization Type [employer]    Progress Note Due on Visit 10    PT Start Time 1015    PT Stop Time 1055    PT Time Calculation (min) 40 min    Activity Tolerance Patient tolerated treatment well          Past Medical History:  Diagnosis Date   Alcoholism (HCC)    Cancer of uvula (HCC)    2021   Heart murmur    Hypertension    Hypothyroidism    Past Surgical History:  Procedure Laterality Date   COLONOSCOPY     KNEE SURGERY Left    x 2   TONSILLECTOMY     TOTAL KNEE ARTHROPLASTY Left 06/04/2024   Procedure: ARTHROPLASTY, KNEE, TOTAL;  Surgeon: Ernie Cough, MD;  Location: [facility];  Service: Orthopedics;  Laterality: Left;   Patient Active Problem List   Diagnosis Date Noted   S/P total knee arthroplasty, left 06/04/2024   Elevated troponin 07/11/2014   Syncope 07/11/2014   PTSD (post-traumatic stress disorder) 10/31/2013   Alcoholism in family 10/31/2013   Dementia (HCC) 10/09/2013   HLD (hyperlipidemia) 02/27/2013   Osteopenia 02/27/2013   Alcohol  dependence (HCC) 06/01/2012   Major depression 06/01/2012   Hypothyroidism 02/23/2009   Essential hypertension 02/23/2009   HYPERGLYCEMIA 02/23/2009    PCP: Aisha Harvey MD  REFERRING PROVIDER: Ernie Cough MD  REFERRING DIAG: [phone] presence of left artificial knee joint  THERAPY DIAG:  Left knee pain; left knee stiffness Rationale for Evaluation and Treatment: Rehabilitation  ONSET DATE: surgery 06/04/24  SUBJECTIVE:   SUBJECTIVE STATEMENT: Did the ex's fine at home with my sister's help.  I downsized the pain pill so I'm taking the lowest dose.  Last night was an 8/10 PERTINENT HISTORY: Hx of Lt TKR 06/04/24 HTN; osteopenia;  depression  Has [employer] membership  PAIN:   Are you having pain? Yes NPRS scale: 4-5/10 Pain location: Left Pain orientation: Left  PAIN TYPE: aching Pain description: intermittent  Aggravating factors: moving Relieving factors: recliner with leg elevated   PRECAUTIONS: None     WEIGHT BEARING RESTRICTIONS: No  FALLS:  Has patient fallen in last 6 months? No  LIVING ENVIRONMENT: Lives with: lives alone but friend living with her now to help Lives in: House/apartment Stairs: Yes: External: 4 steps; on right going up Has following equipment at home: Single point cane and Environmental Consultant - 2 wheeled  OCCUPATION: retired but active doing service work Biochemist, Clinical)  PLOF: Independent and Leisure: trail walking (3 miles)  PATIENT GOALS: be able to travel on an airplane by [employer]  NEXT MD VISIT: 06/20/24  OBJECTIVE:  Note: Objective measures were completed at Evaluation unless otherwise noted.  DIAGNOSTIC FINDINGS: OA left knee  PATIENT SURVEYS:  The Patient-Specific Functional Scale  Initial:  I am going to ask you to identify up to 3 important activities that you are unable to do or are having difficulty with as a result of this problem.  Today are there any activities that you are unable to do or having difficulty with because of this?  (Patient shown scale and patient rated each activity)  Follow up: When you first came in you had  difficulty performing these activities.  Today do you still have difficulty?  Patient-Specific activity scoring scheme (Point to one number):  0 1 2 3 4 5 6 7 8 9  10 Unable                                                                                                          Able to perform To perform                                                                                                    activity at the same Activity         Level as before                                                                                                                        Injury or problem  Activity           Get in/out of bed                                                                      Initial:     2                  follow up:  2.        Get in/out of chair                                                             Initial:      2                 follow up:  3.         Walk to  bathroom                                                                  Initial:      2                 follow up:     COGNITION: Overall cognitive status: Within functional limits for tasks assessed     SENSATION: Anterior thigh numbness  EDEMA:  Circumferential: Rt 35cm, Lt 40.5cm with ACE bandage   LOWER EXTREMITY ROM:  Active ROM Right eval Left eval  Hip flexion    Hip extension    Hip abduction    Hip adduction    Hip internal rotation    Hip external rotation    Knee flexion  30 A/ROM 55 P/ROM - limited by pain in quad  Knee extension  -12  Ankle dorsiflexion    Ankle plantarflexion    Ankle inversion    Ankle eversion     (Blank rows = not tested)  LOWER EXTREMITY MMT: Significant weakness of Lt hip and knee - mild quad set activation on Lt Unable to move Lt leg without external support unless in anti-gravity position Lt hip 2+/5 Lt knee 2+/5   FUNCTIONAL TESTS:  Sit to stand: heavy use of bil UE on armrests, using primarily Rt LE and kicks Lt LE out Transfers: needs external support or max A to transfer Lt LE into bed/onto table  GAIT: Distance walked:  Assistive device utilized: Environmental Consultant - 2 wheeled Level of assistance: Modified independence Comments: ambulates with Lt LE straight                                                                                                                                 TREATMENT DATE:  06/14/2024: Nustep seat 12 level 5 x10 min with PT present to discuss status Seated LAQ (lacks 20 degrees) Manual: seated gentle knee flexion 10x; supine grade 1 extension  mobilization 10x; gentle HS stretch in supine 3x Supine quad sets 10x Assisted SLR 10x (+quad lag, needs assist with initiation) Supine knee flexion to 90 degrees with Les on green ball 10x and HS sets into ball 10x (therapist supporting surgery leg on ball) Standing in walker: retro stepping 12x (rock back for weight bearing on surgery leg) At the stairs with bil UE support: WB on left (surgery leg) with 1st and 2nd step taps 10x  06/12/2024: Nustep level 1 x6 min with PT present to discuss status Seated heel/toe raises x20 Seated marching x20 bilat (cuing on right side to slow down for increased muscle fiber recruitment) Seated left heel slide with foot on a slider 2x10 Supine quad sets 2x10 bilat Supine heel  slides with foot on slider and use of stretch strap 2x10 left LE Supine hip abduction with foot on slider and stretch strap 2x10 left LE Supine straight leg raise with assist from PT 3x5 Manual:  P/ROM to left knee for knee flexion/extension.  Education to sister on how to perform this.   06/10/24 Initiated HEP    PATIENT EDUCATION:  Education details: Educated patient on anatomy and physiology of current symptoms, prognosis, plan of care as well as initial self care strategies to promote recovery Person educated: Patient Education method: Explanation Education comprehension: verbalized understanding  HOME EXERCISE PROGRAM: Access Code: [email or web address] URL: [email or web address] Date: 06/12/2024 Prepared by: Jarrell Menke  Exercises - Supine Quad Set  - 1 x daily - 7 x weekly - 2 sets - 10 reps - 3 sec hold - Supine Hip Abduction  - 1 x daily - 7 x weekly - 2 sets - 10 reps - Supine Knee Extension Stretch on Towel Roll  - 1 x daily - 7 x weekly - 3 sets - 10 reps - Seated Heel Slide  - 1 x daily - 7 x weekly - 3 sets - 10 reps - Supine Heel Slide with Strap  - 1 x daily - 7 x weekly - 2 sets - 10 reps - Small Range Straight Leg Raise  - 1 x daily - 7 x weekly -  2 sets - 10 reps - Sit to Stand with Armchair  - 1 x daily - 7 x weekly - 2 sets - 5 reps  ASSESSMENT:  CLINICAL IMPRESSION: Improving left LE motor control although significant quad lag persists.  Knee flexion with ease to 90 degrees, extension lacking 20 actively and 10 degrees passively.  She is able to bear weight through surgical leg but requires UE support.  Towards the end of session while standing, she reports feeling dizzy, a little nauseated and shaky.  Upon sitting a few minutes and drinking some water the feeling passes.  She states this sometimes happens to her with exertion.  Treatment session ended and supervised her exit leaving the facility.      OBJECTIVE IMPAIRMENTS: decreased activity tolerance, decreased balance, decreased mobility, difficulty walking, decreased ROM, decreased strength, increased edema, impaired perceived functional ability, and pain.   ACTIVITY LIMITATIONS: bending, standing, squatting, stairs, transfers, bathing, toileting, dressing, hygiene/grooming, and locomotion level  PARTICIPATION LIMITATIONS: meal prep, cleaning, laundry, driving, shopping, and community activity  PERSONAL FACTORS: 1-2 comorbidities: HTN are also affecting patient's functional outcome.   REHAB POTENTIAL: Good  CLINICAL DECISION MAKING: Stable/uncomplicated  EVALUATION COMPLEXITY: Low   GOALS: Goals reviewed with patient? Yes  SHORT TERM GOALS: Target date: 07/05/2024   The patient will demonstrate knowledge of basic self care strategies and exercises to promote healing  Baseline: Goal status: Ongoing  2.  Pt will be able to perform sit to stand with equal WB through bil LE with UE assist Baseline:  Goal status: Ongoing  3.  Pt will improve Lt knee A/ROM measured in supine to -4 to 90 deg Baseline:  Goal status: INITIAL  4.  Pt will demo symmetrical gait with walker  Baseline:  Goal status: INITIAL    LONG TERM GOALS: Target date: 08/02/2024    The patient  will be independent in a safe self progression of a home exercise program to promote further recovery of function  Baseline:  Goal status: INITIAL  2.  The patient will report a 60% improvement in pain levels with functional activities which  are currently difficult including transfers, stairs, and bed mobility Baseline:  Goal status: INITIAL  3.  The patient will have improved LE strength of at least 4+/5 needed to ascend and descend steps reciprocally  Baseline:  Goal status: INITIAL  4.  Pt will be able to perform household ambulation and short distance community ambulation with LRAD. Baseline:  Goal status: INITIAL      PLAN:  PT FREQUENCY: 3x/week  PT DURATION: 8 weeks  PLANNED INTERVENTIONS: 97164- PT Re-evaluation, 97750- Physical Performance Testing, 97110-Therapeutic exercises, 97530- Therapeutic activity, [id number]- Neuromuscular re-education, 97535- Self Care, [id number]- Manual therapy, [phone]- Aquatic Therapy, [id number]- Electrical stimulation (unattended), [phone]- Electrical stimulation (manual), 97016- Vasopneumatic device, Patient/Family education, Balance training, Stair training, Taping, Joint mobilization, Cryotherapy, and Moist heat  PLAN FOR NEXT SESSION: monitor exertion level(see clinical impressions above) monitor return of Lt hip and knee strength; NuStep, gait training, knee ROM flexion and extension; LE strengthening, manual techniques, vasocompression   Glade Pesa, PT 06/14/24 12:06 PM Phone: [phone] Fax: [phone]  [facility] [address] [place], [place] [address] Phone # [phone] Fax [phone]

## 2024-06-17 ENCOUNTER — Ambulatory Visit: Admitting: Rehabilitative and Restorative Service Providers"

## 2024-06-17 ENCOUNTER — Encounter: Payer: Self-pay | Admitting: Rehabilitative and Restorative Service Providers"

## 2024-06-17 DIAGNOSIS — M6281 Muscle weakness (generalized): Secondary | ICD-10-CM

## 2024-06-17 DIAGNOSIS — R600 Localized edema: Secondary | ICD-10-CM

## 2024-06-17 DIAGNOSIS — M25562 Pain in left knee: Secondary | ICD-10-CM | POA: Diagnosis not present

## 2024-06-17 DIAGNOSIS — R2689 Other abnormalities of gait and mobility: Secondary | ICD-10-CM

## 2024-06-17 NOTE — Therapy (Signed)
 OUTPATIENT PHYSICAL THERAPY TREATMENT NOTE   Patient Name: Mary Graves MRN: [id number] DOB:1946-04-21, 78 y.o., female Today's Date: 06/17/2024  END OF SESSION:  PT End of Session - 06/17/24 1022     Visit Number 4    Date for Recertification  08/05/24    Authorization Type [employer]    Progress Note Due on Visit 10    PT Start Time 1017    PT Stop Time 1109    PT Time Calculation (min) 52 min    Activity Tolerance Patient tolerated treatment well    Behavior During Therapy WFL for tasks assessed/performed          Past Medical History:  Diagnosis Date   Alcoholism (HCC)    Cancer of uvula (HCC)    2021   Heart murmur    Hypertension    Hypothyroidism    Past Surgical History:  Procedure Laterality Date   COLONOSCOPY     KNEE SURGERY Left    x 2   TONSILLECTOMY     TOTAL KNEE ARTHROPLASTY Left 06/04/2024   Procedure: ARTHROPLASTY, KNEE, TOTAL;  Surgeon: Ernie Cough, MD;  Location: [facility];  Service: Orthopedics;  Laterality: Left;   Patient Active Problem List   Diagnosis Date Noted   S/P total knee arthroplasty, left 06/04/2024   Elevated troponin 07/11/2014   Syncope 07/11/2014   PTSD (post-traumatic stress disorder) 10/31/2013   Alcoholism in family 10/31/2013   Dementia (HCC) 10/09/2013   HLD (hyperlipidemia) 02/27/2013   Osteopenia 02/27/2013   Alcohol  dependence (HCC) 06/01/2012   Major depression 06/01/2012   Hypothyroidism 02/23/2009   Essential hypertension 02/23/2009   HYPERGLYCEMIA 02/23/2009    PCP: Aisha Harvey MD  REFERRING PROVIDER: Ernie Cough MD  REFERRING DIAG: [phone] presence of left artificial knee joint  THERAPY DIAG:  Left knee pain; left knee stiffness Rationale for Evaluation and Treatment: Rehabilitation  ONSET DATE: surgery 06/04/24  SUBJECTIVE:   SUBJECTIVE STATEMENT:  Patient reports that she is using a different compression hose today that is easier for her to don/doff easier.  Patient reports  some increased pain after removing the TED hose yesterday.  PERTINENT HISTORY: Hx of Lt TKR 06/04/24 HTN; osteopenia; depression  Has [employer] membership  PAIN:   Are you having pain? Yes NPRS scale: 4/10 Pain location: Left Pain orientation: Left  PAIN TYPE: aching Pain description: intermittent  Aggravating factors: moving Relieving factors: recliner with leg elevated   PRECAUTIONS: None     WEIGHT BEARING RESTRICTIONS: No  FALLS:  Has patient fallen in last 6 months? No  LIVING ENVIRONMENT: Lives with: lives alone but friend living with her now to help Lives in: House/apartment Stairs: Yes: External: 4 steps; on right going up Has following equipment at home: Single point cane and Environmental Consultant - 2 wheeled  OCCUPATION: retired but active doing service work Biochemist, Clinical)  PLOF: Independent and Leisure: trail walking (3 miles)  PATIENT GOALS: be able to travel on an airplane by [employer]  NEXT MD VISIT: 06/20/24  OBJECTIVE:  Note: Objective measures were completed at Evaluation unless otherwise noted.  DIAGNOSTIC FINDINGS: OA left knee  PATIENT SURVEYS:  The Patient-Specific Functional Scale  Initial:  I am going to ask you to identify up to 3 important activities that you are unable to do or are having difficulty with as a result of this problem.  Today are there any activities that you are unable to do or having difficulty with because of this?  (Patient shown scale  and patient rated each activity)  Follow up: When you first came in you had difficulty performing these activities.  Today do you still have difficulty?  Patient-Specific activity scoring scheme (Point to one number):  0 1 2 3 4 5 6 7 8 9  10 Unable                                                                                                          Able to perform To perform                                                                                                    activity at the  same Activity         Level as before                                                                                                                       Injury or problem  Activity  Get in/out of bed  Initial:     2                  follow up:  2.  Get in/out of chair  Initial:      2                 follow up:  3.  Walk to bathroom  Initial:      2                 follow up:     COGNITION: Overall cognitive status: Within functional limits for tasks assessed     SENSATION: Anterior thigh numbness  EDEMA:  Circumferential: Rt 35cm, Lt 40.5cm with ACE bandage   LOWER EXTREMITY ROM:  Active ROM Right eval Left eval Left 06/17/24  Hip flexion     Hip extension     Hip abduction     Hip adduction     Hip internal rotation     Hip external rotation     Knee flexion  30 A/ROM 55 P/ROM - limited by pain in quad 95  Knee extension  -12 -12  Ankle dorsiflexion     Ankle plantarflexion     Ankle inversion     Ankle eversion      (Blank rows = not tested)  LOWER EXTREMITY MMT: Significant weakness of Lt hip and knee - mild quad set activation on Lt Unable to move Lt leg without external support unless in anti-gravity position Lt hip 2+/5 Lt knee 2+/5   FUNCTIONAL TESTS:  Sit to stand: heavy use of bil UE on armrests, using primarily Rt LE and kicks Lt LE out Transfers: needs external support or max A to transfer Lt LE into bed/onto table  GAIT: Distance walked:  Assistive device utilized: Environmental Consultant - 2 wheeled Level of assistance: Modified independence Comments: ambulates with Lt LE straight                                                                                                                                 TREATMENT DATE:   06/17/2024: Nustep level 3 x6 min with PT present to discuss status Seated LAQ 2x10 left LE Seated quad set 2x10 left LE Seated hamstring stretch 2x20 sec bilat Seated marching 2x10 bilat Supine heel slides with foot on  slider and use of stretch strap 2x10 left LE Supine hip abduction with foot on slider 2x10 Left LE (with cuing for technique and leg positioning) Supine short arc quad 2x10 left LE Supine straight leg raise with assist from PT 2x10 left LE Supine quad set with heel elevated on bolster x10 left LE Vasocompression:  Game Ready to left knee x10 min with 3 snowflakes with medium compression   06/14/2024: Nustep seat 12 level 5 x10 min with PT present to discuss status Seated LAQ (lacks 20 degrees) Manual: seated gentle knee flexion 10x; supine grade 1 extension mobilization 10x; gentle HS stretch in supine 3x Supine quad sets 10x Assisted SLR 10x (+quad lag, needs assist with initiation) Supine knee flexion to 90 degrees with Les on green ball 10x and HS sets into ball 10x (therapist supporting surgery leg on ball) Standing in walker: retro stepping 12x (rock back for weight bearing on surgery leg) At the stairs with bil UE support: WB on left (surgery leg) with 1st and 2nd step taps 10x  06/12/2024: Nustep level 1 x6 min with PT present to discuss status Seated heel/toe raises x20 Seated marching x20 bilat (cuing on right side to slow down for increased muscle fiber recruitment) Seated left heel slide with foot on a slider 2x10 Supine quad sets 2x10 bilat Supine heel slides with foot on slider and use of stretch strap 2x10 left LE Supine hip abduction with foot on slider and stretch strap 2x10 left LE Supine straight leg raise with assist from PT 3x5 Manual:  P/ROM to left knee for knee flexion/extension.  Education to sister on how to perform this.    PATIENT EDUCATION:  Education details: Educated patient on anatomy and physiology of current symptoms, prognosis, plan  of care as well as initial self care strategies to promote recovery Person educated: Patient Education method: Explanation Education comprehension: verbalized understanding  HOME EXERCISE PROGRAM: Access Code:  [email or web address] URL: [email or web address] Date: 06/12/2024 Prepared by: Jarrell Tymira Horkey  Exercises - Supine Quad Set  - 1 x daily - 7 x weekly - 2 sets - 10 reps - 3 sec hold - Supine Hip Abduction  - 1 x daily - 7 x weekly - 2 sets - 10 reps - Supine Knee Extension Stretch on Towel Roll  - 1 x daily - 7 x weekly - 3 sets - 10 reps - Seated Heel Slide  - 1 x daily - 7 x weekly - 3 sets - 10 reps - Supine Heel Slide with Strap  - 1 x daily - 7 x weekly - 2 sets - 10 reps - Small Range Straight Leg Raise  - 1 x daily - 7 x weekly - 2 sets - 10 reps - Sit to Stand with Armchair  - 1 x daily - 7 x weekly - 2 sets - 5 reps  ASSESSMENT:  CLINICAL IMPRESSION:  Ms Menard presents to skilled PT presents to skilled PT reporting that she is doing her exercises at home with her sister.  Patient with increased ROM noted today, but continues to have difficulty with knee extension.  Patient is requiring less assistance with straight leg raise today.  Continues with difficulty with quad set, especially with ankle elevated and requires cuing for isolating the quad (and not incorporating a glute set).  Patient continues to require skilled PT to progress towards goal related activities.  OBJECTIVE IMPAIRMENTS: decreased activity tolerance, decreased balance, decreased mobility, difficulty walking, decreased ROM, decreased strength, increased edema, impaired perceived functional ability, and pain.   ACTIVITY LIMITATIONS: bending, standing, squatting, stairs, transfers, bathing, toileting, dressing, hygiene/grooming, and locomotion level  PARTICIPATION LIMITATIONS: meal prep, cleaning, laundry, driving, shopping, and community activity  PERSONAL FACTORS: 1-2 comorbidities: HTN are also affecting patient's functional outcome.   REHAB POTENTIAL: Good  CLINICAL DECISION MAKING: Stable/uncomplicated  EVALUATION COMPLEXITY: Low   GOALS: Goals reviewed with patient? Yes  SHORT TERM GOALS: Target  date: 07/05/2024   The patient will demonstrate knowledge of basic self care strategies and exercises to promote healing  Baseline: Goal status: Ongoing  2.  Pt will be able to perform sit to stand with equal WB through bil LE with UE assist Baseline:  Goal status: Ongoing  3.  Pt will improve Lt knee A/ROM measured in supine to -4 to 90 deg Baseline:  Goal status: Ongoing (see above)  4.  Pt will demo symmetrical gait with walker  Baseline:  Goal status: Ongoing    LONG TERM GOALS: Target date: 08/02/2024    The patient will be independent in a safe self progression of a home exercise program to promote further recovery of function  Baseline:  Goal status: INITIAL  2.  The patient will report a 60% improvement in pain levels with functional activities which are currently difficult including transfers, stairs, and bed mobility Baseline:  Goal status: INITIAL  3.  The patient will have improved LE strength of at least 4+/5 needed to ascend and descend steps reciprocally  Baseline:  Goal status: INITIAL  4.  Pt will be able to perform household ambulation and short distance community ambulation with LRAD. Baseline:  Goal status: INITIAL      PLAN:  PT FREQUENCY: 3x/week  PT DURATION: 8 weeks  PLANNED INTERVENTIONS: 02835- PT  Re-evaluation, 97750- Physical Performance Testing, 97110-Therapeutic exercises, 97530- Therapeutic activity, [id number]- Neuromuscular re-education, 97535- Self Care, 02859- Manual therapy, [phone]- Aquatic Therapy, [phone]- Electrical stimulation (unattended), [phone]- Electrical stimulation (manual), 97016- Vasopneumatic device, Patient/Family education, Balance training, Stair training, Taping, Joint mobilization, Cryotherapy, and Moist heat  PLAN FOR NEXT SESSION: monitor exertion level, monitor return of Lt hip and knee strength; NuStep, gait training, knee ROM flexion and extension; LE strengthening, manual techniques, vasocompression   Jarrell Laming,  PT, DPT 06/17/24, 11:21 AM  [facility] [address] [place], [place] [address] Phone # [phone] Fax [phone]

## 2024-06-19 ENCOUNTER — Ambulatory Visit: Admitting: Rehabilitative and Restorative Service Providers"

## 2024-06-19 ENCOUNTER — Encounter: Payer: Self-pay | Admitting: Rehabilitative and Restorative Service Providers"

## 2024-06-19 DIAGNOSIS — M6281 Muscle weakness (generalized): Secondary | ICD-10-CM

## 2024-06-19 DIAGNOSIS — M25562 Pain in left knee: Secondary | ICD-10-CM | POA: Diagnosis not present

## 2024-06-19 DIAGNOSIS — R600 Localized edema: Secondary | ICD-10-CM

## 2024-06-19 DIAGNOSIS — R2689 Other abnormalities of gait and mobility: Secondary | ICD-10-CM

## 2024-06-19 NOTE — Therapy (Signed)
 OUTPATIENT PHYSICAL THERAPY TREATMENT NOTE   Patient Name: Mary Graves MRN: [id number] DOB:02/08/1946, 78 y.o., female Today's Date: 06/19/2024  END OF SESSION:  PT End of Session - 06/19/24 1018     Visit Number 5    Date for Recertification  08/05/24    Authorization Type [employer]    Progress Note Due on Visit 10    PT Start Time 1015    PT Stop Time 1107    PT Time Calculation (min) 52 min    Activity Tolerance Patient tolerated treatment well    Behavior During Therapy WFL for tasks assessed/performed          Past Medical History:  Diagnosis Date   Alcoholism (HCC)    Cancer of uvula (HCC)    2021   Heart murmur    Hypertension    Hypothyroidism    Past Surgical History:  Procedure Laterality Date   COLONOSCOPY     KNEE SURGERY Left    x 2   TONSILLECTOMY     TOTAL KNEE ARTHROPLASTY Left 06/04/2024   Procedure: ARTHROPLASTY, KNEE, TOTAL;  Surgeon: Ernie Cough, MD;  Location: [facility];  Service: Orthopedics;  Laterality: Left;   Patient Active Problem List   Diagnosis Date Noted   S/P total knee arthroplasty, left 06/04/2024   Elevated troponin 07/11/2014   Syncope 07/11/2014   PTSD (post-traumatic stress disorder) 10/31/2013   Alcoholism in family 10/31/2013   Dementia (HCC) 10/09/2013   HLD (hyperlipidemia) 02/27/2013   Osteopenia 02/27/2013   Alcohol  dependence (HCC) 06/01/2012   Major depression 06/01/2012   Hypothyroidism 02/23/2009   Essential hypertension 02/23/2009   HYPERGLYCEMIA 02/23/2009    PCP: Aisha Harvey MD  REFERRING PROVIDER: Ernie Cough MD  REFERRING DIAG: [phone] presence of left artificial knee joint  THERAPY DIAG:  Left knee pain; left knee stiffness Rationale for Evaluation and Treatment: Rehabilitation  ONSET DATE: surgery 06/04/24  SUBJECTIVE:   SUBJECTIVE STATEMENT:  Patient reports that she is having some red/scabby areas on her left knee and she is wanting it looked at.  Patient is hoping that  she can start using a SPC in her home soon.  PERTINENT HISTORY: Hx of Lt TKR 06/04/24 HTN; osteopenia; depression  Has [employer] membership  PAIN:   Are you having pain? Yes NPRS scale: 6/10 Pain location: Left Pain orientation: Left  PAIN TYPE: aching Pain description: intermittent  Aggravating factors: moving Relieving factors: recliner with leg elevated   PRECAUTIONS: None     WEIGHT BEARING RESTRICTIONS: No  FALLS:  Has patient fallen in last 6 months? No  LIVING ENVIRONMENT: Lives with: lives alone but friend living with her now to help Lives in: House/apartment Stairs: Yes: External: 4 steps; on right going up Has following equipment at home: Single point cane and Environmental Consultant - 2 wheeled  OCCUPATION: retired but active doing service work Biochemist, [employer])  PLOF: Independent and Leisure: trail walking (3 miles)  PATIENT GOALS: be able to travel on an airplane by [employer]  NEXT MD VISIT: 06/20/24  OBJECTIVE:  Note: Objective measures were completed at Evaluation unless otherwise noted.  DIAGNOSTIC FINDINGS: OA left knee  PATIENT SURVEYS:  The Patient-Specific Functional Scale  Initial:  I am going to ask you to identify up to 3 important activities that you are unable to do or are having difficulty with as a result of this problem.  Today are there any activities that you are unable to do or having difficulty with because of this?  (  Patient shown scale and patient rated each activity)  Follow up: When you first came in you had difficulty performing these activities.  Today do you still have difficulty?  Patient-Specific activity scoring scheme (Point to one number):  0 1 2 3 4 5 6 7 8 9  10 Unable                                                                                                          Able to perform To perform                                                                                                    activity at the  same Activity         Level as before                                                                                                                       Injury or problem  Activity  Get in/out of bed  Initial:     2                  follow up:  2.  Get in/out of chair  Initial:      2                 follow up:  3.  Walk to bathroom  Initial:      2                 follow up:     COGNITION: Overall cognitive status: Within functional limits for tasks assessed     SENSATION: Anterior thigh numbness  EDEMA:  Eval:  Circumferential: Rt 35cm, Lt 40.5cm with ACE bandage   LOWER EXTREMITY ROM:  Active ROM Right eval Left eval Left 06/17/24  Hip flexion     Hip extension     Hip abduction     Hip adduction     Hip internal rotation     Hip external rotation     Knee flexion  30 A/ROM 55 P/ROM - limited by pain in quad 95  Knee  extension  -12 -12  Ankle dorsiflexion     Ankle plantarflexion     Ankle inversion     Ankle eversion      (Blank rows = not tested)  LOWER EXTREMITY MMT: Significant weakness of Lt hip and knee - mild quad set activation on Lt Unable to move Lt leg without external support unless in anti-gravity position Lt hip 2+/5 Lt knee 2+/5   FUNCTIONAL TESTS:  Eval: Sit to stand: heavy use of bil UE on armrests, using primarily Rt LE and kicks Lt LE out Transfers: needs external support or max A to transfer Lt LE into bed/onto table  06/19/2024: Timed up and Go (TUG):  21.96 sec with SPC 3 minute walk test: 358 ft with SPC  GAIT: Distance walked:  Assistive device utilized: Environmental Consultant - 2 wheeled Level of assistance: Modified independence Comments: ambulates with Lt LE straight                                                                                                                                 TREATMENT DATE:   06/19/2024: Nustep level 3 x6 min with PT present to discuss status Sit to/from stand x10 with cuing for increased use  of left LE Skin assessment:  noted red area with scab on lateral side of bandage with some red areas on the medial aspect of bandage.  Slight warmth noted.  Pt to see ortho office today Seated LAQ 2x10 left LE Seated quad set with lower leg propped on PT's leg x10 left LE Seated with left ankle propped on walker with 3# ankle weight on knee to encourage knee extension x3 min Seated heel slides x10 Timed up and Go (TUG):  21.96 sec with SPC 3 minute walk test: 358 ft with SPC Standing at barre:  high marching, hip abduction, hamstring curl.  X10 bilat Vasocompression:  Game Ready to left knee x10 min with 3 snowflakes with medium compression   06/17/2024: Nustep level 3 x6 min with PT present to discuss status Seated LAQ 2x10 left LE Seated quad set 2x10 left LE Seated hamstring stretch 2x20 sec bilat Seated marching 2x10 bilat Supine heel slides with foot on slider and use of stretch strap 2x10 left LE Supine hip abduction with foot on slider 2x10 Left LE (with cuing for technique and leg positioning) Supine short arc quad 2x10 left LE Supine straight leg raise with assist from PT 2x10 left LE Supine quad set with heel elevated on bolster x10 left LE Vasocompression:  Game Ready to left knee x10 min with 3 snowflakes with medium compression   06/14/2024: Nustep seat 12 level 5 x10 min with PT present to discuss status Seated LAQ (lacks 20 degrees) Manual: seated gentle knee flexion 10x; supine grade 1 extension mobilization 10x; gentle HS stretch in supine 3x Supine quad sets 10x Assisted SLR 10x (+quad lag, needs assist with initiation) Supine knee flexion to 90 degrees  with Les on green ball 10x and HS sets into ball 10x (therapist supporting surgery leg on ball) Standing in walker: retro stepping 12x (rock back for weight bearing on surgery leg) At the stairs with bil UE support: WB on left (surgery leg) with 1st and 2nd step taps 10x   PATIENT EDUCATION:  Education details:  Educated patient on anatomy and physiology of current symptoms, prognosis, plan of care as well as initial self care strategies to promote recovery Person educated: Patient Education method: Explanation Education comprehension: verbalized understanding  HOME EXERCISE PROGRAM: Access Code: [email or web address] URL: [email or web address] Date: 06/12/2024 Prepared by: Jarrell Rashan Patient  Exercises - Supine Quad Set  - 1 x daily - 7 x weekly - 2 sets - 10 reps - 3 sec hold - Supine Hip Abduction  - 1 x daily - 7 x weekly - 2 sets - 10 reps - Supine Knee Extension Stretch on Towel Roll  - 1 x daily - 7 x weekly - 3 sets - 10 reps - Seated Heel Slide  - 1 x daily - 7 x weekly - 3 sets - 10 reps - Supine Heel Slide with Strap  - 1 x daily - 7 x weekly - 2 sets - 10 reps - Small Range Straight Leg Raise  - 1 x daily - 7 x weekly - 2 sets - 10 reps - Sit to Stand with Armchair  - 1 x daily - 7 x weekly - 2 sets - 5 reps  ASSESSMENT:  CLINICAL IMPRESSION:  Ms Kope presents to skilled PT presents to skilled PT reporting that she goes to her MD tomorrow and she wants to ask them about her leg, as it seems more red and is tender to touch.  Patient was educated about ambulation with a SPC and was able to perform a TUG and 3 minute walk test today with SPC.  Patient with quad lag noted and continues with difficulty with long arc quad.  Patient continues to have difficulty with knee extension, so performed knee extension stretch with foot propped on walker with 3# weight on knee to encourage improved knee extension.  Patient with some pain during this stretch, but able to complete.  Patient able to tolerate increased standing exercises with one brief seated recovery period during standing exercises (but did not report feeling faint, like previous session).  Patient continues to have great response to Game Ready at end of session.  Patient continues to require skilled PT to progress towards goal related  activities.  OBJECTIVE IMPAIRMENTS: decreased activity tolerance, decreased balance, decreased mobility, difficulty walking, decreased ROM, decreased strength, increased edema, impaired perceived functional ability, and pain.   ACTIVITY LIMITATIONS: bending, standing, squatting, stairs, transfers, bathing, toileting, dressing, hygiene/grooming, and locomotion level  PARTICIPATION LIMITATIONS: meal prep, cleaning, laundry, driving, shopping, and community activity  PERSONAL FACTORS: 1-2 comorbidities: HTN are also affecting patient's functional outcome.   REHAB POTENTIAL: Good  CLINICAL DECISION MAKING: Stable/uncomplicated  EVALUATION COMPLEXITY: Low   GOALS: Goals reviewed with patient? Yes  SHORT TERM GOALS: Target date: 07/05/2024   The patient will demonstrate knowledge of basic self care strategies and exercises to promote healing  Baseline: Goal status: Ongoing  2.  Pt will be able to perform sit to stand with equal WB through bil LE with UE assist Baseline:  Goal status: Ongoing  3.  Pt will improve Lt knee A/ROM measured in supine to -4 to 90 deg Baseline:  Goal status: Ongoing (see above)  4.  Pt will demo symmetrical gait with walker  Baseline:  Goal status: Ongoing    LONG TERM GOALS: Target date: 08/02/2024    The patient will be independent in a safe self progression of a home exercise program to promote further recovery of function  Baseline:  Goal status: INITIAL  2.  The patient will report a 60% improvement in pain levels with functional activities which are currently difficult including transfers, stairs, and bed mobility Baseline:  Goal status: INITIAL  3.  The patient will have improved LE strength of at least 4+/5 needed to ascend and descend steps reciprocally  Baseline:  Goal status: INITIAL  4.  Pt will be able to perform household ambulation and short distance community ambulation with LRAD. Baseline:  Goal status:  INITIAL      PLAN:  PT FREQUENCY: 3x/week  PT DURATION: 8 weeks  PLANNED INTERVENTIONS: 97164- PT Re-evaluation, 97750- Physical Performance Testing, 97110-Therapeutic exercises, 97530- Therapeutic activity, [id number]- Neuromuscular re-education, 97535- Self Care, [id number]- Manual therapy, [id number]- Aquatic Therapy, [id number]- Electrical stimulation (unattended), [phone]- Electrical stimulation (manual), 97016- Vasopneumatic device, Patient/Family education, Balance training, Stair training, Taping, Joint mobilization, Cryotherapy, and Moist heat  PLAN FOR NEXT SESSION: monitor exertion level, monitor return of Lt hip and knee strength; NuStep, gait training, knee ROM flexion and extension; LE strengthening, manual techniques, vasocompression, ask about MD appointment   Jarrell Laming, PT, DPT 06/19/24, 11:44 AM  [facility] [address], [address] [place], [place] [address] Phone # [phone] Fax [phone]

## 2024-06-21 ENCOUNTER — Encounter: Payer: Self-pay | Admitting: Physical Therapy

## 2024-06-21 ENCOUNTER — Ambulatory Visit: Admitting: Physical Therapy

## 2024-06-21 DIAGNOSIS — M25562 Pain in left knee: Secondary | ICD-10-CM | POA: Diagnosis not present

## 2024-06-21 DIAGNOSIS — M6281 Muscle weakness (generalized): Secondary | ICD-10-CM

## 2024-06-21 DIAGNOSIS — R600 Localized edema: Secondary | ICD-10-CM

## 2024-06-21 DIAGNOSIS — R2689 Other abnormalities of gait and mobility: Secondary | ICD-10-CM

## 2024-06-21 NOTE — Therapy (Signed)
 OUTPATIENT PHYSICAL THERAPY TREATMENT NOTE   Patient Name: Mary Graves MRN: [id number] DOB:1946/04/15, 78 y.o., female Today's Date: 06/21/2024  END OF SESSION:  PT End of Session - 06/21/24 1014     Visit Number 5    Date for Recertification  08/05/24    Authorization Type [employer]    Progress Note Due on Visit 10    PT Start Time 1015    PT Stop Time 1100    PT Time Calculation (min) 45 min    Activity Tolerance Patient tolerated treatment well          Past Medical History:  Diagnosis Date   Alcoholism (HCC)    Cancer of uvula (HCC)    2021   Heart murmur    Hypertension    Hypothyroidism    Past Surgical History:  Procedure Laterality Date   COLONOSCOPY     KNEE SURGERY Left    x 2   TONSILLECTOMY     TOTAL KNEE ARTHROPLASTY Left 06/04/2024   Procedure: ARTHROPLASTY, KNEE, TOTAL;  Surgeon: Ernie Cough, MD;  Location: [facility];  Service: Orthopedics;  Laterality: Left;   Patient Active Problem List   Diagnosis Date Noted   S/P total knee arthroplasty, left 06/04/2024   Elevated troponin 07/11/2014   Syncope 07/11/2014   PTSD (post-traumatic stress disorder) 10/31/2013   Alcoholism in family 10/31/2013   Dementia (HCC) 10/09/2013   HLD (hyperlipidemia) 02/27/2013   Osteopenia 02/27/2013   Alcohol  dependence (HCC) 06/01/2012   Major depression 06/01/2012   Hypothyroidism 02/23/2009   Essential hypertension 02/23/2009   HYPERGLYCEMIA 02/23/2009    PCP: Aisha Harvey MD  REFERRING PROVIDER: Ernie Cough MD  REFERRING DIAG: [phone] presence of left artificial knee joint  THERAPY DIAG:  Left knee pain; left knee stiffness Rationale for Evaluation and Treatment: Rehabilitation  ONSET DATE: surgery 06/04/24  SUBJECTIVE:   SUBJECTIVE STATEMENT: Saw the ortho and they said it wasn't infected and that spot is from the swelling causing a skin tear.  Will follow up in 2 weeks. I've been doing a lot this morning and I'm a little short of  breath. She is able to sit/rest and feels recovered.    PERTINENT HISTORY: Hx of Lt TKR 06/04/24 HTN; osteopenia; depression  Has [employer] membership  PAIN:   Are you having pain? Yes NPRS scale: 6/10 Pain location: Left Pain orientation: Left  PAIN TYPE: aching Pain description: intermittent  Aggravating factors: moving Relieving factors: recliner with leg elevated   PRECAUTIONS: None     WEIGHT BEARING RESTRICTIONS: No  FALLS:  Has patient fallen in last 6 months? No  LIVING ENVIRONMENT: Lives with: lives alone but friend living with her now to help Lives in: House/apartment Stairs: Yes: External: 4 steps; on right going up Has following equipment at home: Single point cane and Environmental Consultant - 2 wheeled  OCCUPATION: retired but active doing service work Biochemist, Clinical)  PLOF: Independent and Leisure: trail walking (3 miles)  PATIENT GOALS: be able to travel on an airplane by [employer]  NEXT MD VISIT: 06/20/24  OBJECTIVE:  Note: Objective measures were completed at Evaluation unless otherwise noted.  DIAGNOSTIC FINDINGS: OA left knee  PATIENT SURVEYS:  The Patient-Specific Functional Scale  Initial:  I am going to ask you to identify up to 3 important activities that you are unable to do or are having difficulty with as a result of this problem.  Today are there any activities that you are unable to do or having  difficulty with because of this?  (Patient shown scale and patient rated each activity)  Follow up: When you first came in you had difficulty performing these activities.  Today do you still have difficulty?  Patient-Specific activity scoring scheme (Point to one number):  0 1 2 3 4 5 6 7 8 9  10 Unable                                                                                                          Able to perform To perform                                                                                                    activity at the  same Activity         Level as before                                                                                                                       Injury or problem  Activity  Get in/out of bed  Initial:     2                  follow up:  2.  Get in/out of chair  Initial:      2                 follow up:  3.  Walk to bathroom  Initial:      2                 follow up:     COGNITION: Overall cognitive status: Within functional limits for tasks assessed     SENSATION: Anterior thigh numbness  EDEMA:  Eval:  Circumferential: Rt 35cm, Lt 40.5cm with ACE bandage   LOWER EXTREMITY ROM:  Active ROM Right eval Left eval Left 06/17/24 11/21  Hip flexion      Hip extension      Hip abduction      Hip adduction      Hip internal rotation      Hip external rotation      Knee flexion  30 A/ROM 55 P/ROM - limited by pain in quad 95   Knee extension  -12 -12 -10  Ankle dorsiflexion      Ankle plantarflexion      Ankle inversion      Ankle eversion       (Blank rows = not tested)  LOWER EXTREMITY MMT: Significant weakness of Lt hip and knee - mild quad set activation on Lt Unable to move Lt leg without external support unless in anti-gravity position Lt hip 2+/5 Lt knee 2+/5   FUNCTIONAL TESTS:  Eval: Sit to stand: heavy use of bil UE on armrests, using primarily Rt LE and kicks Lt LE out Transfers: needs external support or max A to transfer Lt LE into bed/onto table  06/19/2024: Timed up and Go (TUG):  21.96 sec with SPC 3 minute walk test: 358 ft with SPC  GAIT: Distance walked:  Assistive device utilized: Environmental Consultant - 2 wheeled Level of assistance: Modified independence Comments: ambulates with Lt LE straight                                                                                                                                 TREATMENT DATE:  06/21/2024: Nustep level 1 x6 min with seat set on 11 to encourage knee extension with PT present to  discuss status Manual:; supine grade 2 extension mobilization 10x; gentle HS stretch in supine 3x Sit to/from stand x10 with cuing for increased use of left LE Seated LAQ 1x10 left LE; heel prop on 6 inch step with heel lift offs 10x, 3 sec holds  Seated heel sliders for flexion x10 Standing TKEs green band 2x10 Retro stepping 45 sec shifting weight onto left WB on left: right forward toe taps 8x, side toe taps 8x, 3 way toe taps 8x  Reports increased exertion and requests a rest break Supine active quad set 15x Vasocompression:  Game Ready to left knee x10 min with 3 snowflakes with medium compression  06/19/2024: Nustep level 3 x6 min with PT present to discuss status Sit to/from stand x10 with cuing for increased use of left LE Skin assessment:  noted red area with scab on lateral side of bandage with some red areas on the medial aspect of bandage.  Slight warmth noted.  Pt to see ortho office today Seated LAQ 2x10 left LE Seated quad set with lower leg propped on PT's leg x10 left LE Seated with left ankle propped on walker with 3# ankle weight on knee to encourage knee extension x3 min Seated heel slides x10 Timed up and Go (TUG):  21.96 sec with SPC 3 minute walk test: 358 ft with SPC Standing at barre:  high marching, hip abduction, hamstring curl.  X10 bilat Vasocompression:  Game Ready to left knee x10 min with 3 snowflakes with medium compression   06/17/2024: Nustep level 3 x6 min with PT present to discuss status Seated LAQ 2x10 left LE  Seated quad set 2x10 left LE Seated hamstring stretch 2x20 sec bilat Seated marching 2x10 bilat Supine heel slides with foot on slider and use of stretch strap 2x10 left LE Supine hip abduction with foot on slider 2x10 Left LE (with cuing for technique and leg positioning) Supine short arc quad 2x10 left LE Supine straight leg raise with assist from PT 2x10 left LE Supine quad set with heel elevated on bolster x10 left  LE Vasocompression:  Game Ready to left knee x10 min with 3 snowflakes with medium compression   06/14/2024: Nustep seat 12 level 5 x10 min with PT present to discuss status Seated LAQ (lacks 20 degrees) Manual: seated gentle knee flexion 10x; supine grade 1 extension mobilization 10x; gentle HS stretch in supine 3x Supine quad sets 10x Assisted SLR 10x (+quad lag, needs assist with initiation) Supine knee flexion to 90 degrees with Les on green ball 10x and HS sets into ball 10x (therapist supporting surgery leg on ball) Standing in walker: retro stepping 12x (rock back for weight bearing on surgery leg) At the stairs with bil UE support: WB on left (surgery leg) with 1st and 2nd step taps 10x   PATIENT EDUCATION:  Education details: Educated patient on anatomy and physiology of current symptoms, prognosis, plan of care as well as initial self care strategies to promote recovery Person educated: Patient Education method: Explanation Education comprehension: verbalized understanding  HOME EXERCISE PROGRAM: Access Code: [email or web address] URL: [email or web address] Date: 06/12/2024 Prepared by: Jarrell Menke  Exercises - Supine Quad Set  - 1 x daily - 7 x weekly - 2 sets - 10 reps - 3 sec hold - Supine Hip Abduction  - 1 x daily - 7 x weekly - 2 sets - 10 reps - Supine Knee Extension Stretch on Towel Roll  - 1 x daily - 7 x weekly - 3 sets - 10 reps - Seated Heel Slide  - 1 x daily - 7 x weekly - 3 sets - 10 reps - Supine Heel Slide with Strap  - 1 x daily - 7 x weekly - 2 sets - 10 reps - Small Range Straight Leg Raise  - 1 x daily - 7 x weekly - 2 sets - 10 reps - Sit to Stand with Armchair  - 1 x daily - 7 x weekly - 2 sets - 5 reps  ASSESSMENT:  CLINICAL IMPRESSION: Treatment focus on knee extension ROM, quad muscle activation and edema management.  Quad motor control continues to improve but limited in terminal endrange.  Improving weight bearing tolerance on left.  Therapist closing monitoring fatigue level and modifying treatment as needed.  Good response to vasocompression for edema control.     OBJECTIVE IMPAIRMENTS: decreased activity tolerance, decreased balance, decreased mobility, difficulty walking, decreased ROM, decreased strength, increased edema, impaired perceived functional ability, and pain.   ACTIVITY LIMITATIONS: bending, standing, squatting, stairs, transfers, bathing, toileting, dressing, hygiene/grooming, and locomotion level  PARTICIPATION LIMITATIONS: meal prep, cleaning, laundry, driving, shopping, and community activity  PERSONAL FACTORS: 1-2 comorbidities: HTN are also affecting patient's functional outcome.   REHAB POTENTIAL: Good  CLINICAL DECISION MAKING: Stable/uncomplicated  EVALUATION COMPLEXITY: Low   GOALS: Goals reviewed with patient? Yes  SHORT TERM GOALS: Target date: 07/05/2024   The patient will demonstrate knowledge of basic self care strategies and exercises to promote healing  Baseline: Goal status: met 11/21  2.  Pt will be able to perform sit to stand with equal WB through bil LE with UE  assist Baseline:  Goal status: Ongoing  3.  Pt will improve Lt knee A/ROM measured in supine to -4 to 90 deg Baseline:  Goal status: Ongoing (see above)  4.  Pt will demo symmetrical gait with walker  Baseline:  Goal status: met 11/21    LONG TERM GOALS: Target date: 08/02/2024    The patient will be independent in a safe self progression of a home exercise program to promote further recovery of function  Baseline:  Goal status: INITIAL  2.  The patient will report a 60% improvement in pain levels with functional activities which are currently difficult including transfers, stairs, and bed mobility Baseline:  Goal status: INITIAL  3.  The patient will have improved LE strength of at least 4+/5 needed to ascend and descend steps reciprocally  Baseline:  Goal status: INITIAL  4.  Pt will be able to  perform household ambulation and short distance community ambulation with LRAD. Baseline:  Goal status: INITIAL      PLAN:  PT FREQUENCY: 3x/week  PT DURATION: 8 weeks  PLANNED INTERVENTIONS: 97164- PT Re-evaluation, 97750- Physical Performance Testing, 97110-Therapeutic exercises, 97530- Therapeutic activity, [id number]- Neuromuscular re-education, 97535- Self Care, [id number]- Manual therapy, [id number]- Aquatic Therapy, [id number]- Electrical stimulation (unattended), [id number]- Electrical stimulation (manual), 97016- Vasopneumatic device, Patient/Family education, Balance training, Stair training, Taping, Joint mobilization, Cryotherapy, and Moist heat  PLAN FOR NEXT SESSION: monitor exertion level, monitor return of Lt hip and knee strength; NuStep, gait training, knee ROM flexion and extension; LE strengthening, manual techniques, vasocompression, ask about MD appointment   Glade Pesa, PT 06/21/24 12:12 PM Phone: [phone] Fax: [phone]  [facility] [address] [place], [place] [address] Phone # [phone] Fax [phone]

## 2024-06-24 ENCOUNTER — Encounter: Payer: Self-pay | Admitting: Rehabilitative and Restorative Service Providers"

## 2024-06-24 ENCOUNTER — Ambulatory Visit: Admitting: Rehabilitative and Restorative Service Providers"

## 2024-06-24 DIAGNOSIS — R2689 Other abnormalities of gait and mobility: Secondary | ICD-10-CM

## 2024-06-24 DIAGNOSIS — M6281 Muscle weakness (generalized): Secondary | ICD-10-CM

## 2024-06-24 DIAGNOSIS — R600 Localized edema: Secondary | ICD-10-CM

## 2024-06-24 DIAGNOSIS — M25562 Pain in left knee: Secondary | ICD-10-CM

## 2024-06-24 NOTE — Therapy (Signed)
 OUTPATIENT PHYSICAL THERAPY TREATMENT NOTE   Patient Name: Mary Graves MRN: [id number] DOB:February 02, 1946, 78 y.o., female Today's Date: 06/24/2024  END OF SESSION:  PT End of Session - 06/24/24 1101     Visit Number 6    Date for Recertification  08/05/24    Authorization Type [employer]    Progress Note Due on Visit 10    PT Start Time 1058    PT Stop Time 1150    PT Time Calculation (min) 52 min    Activity Tolerance Patient tolerated treatment well    Behavior During Therapy WFL for tasks assessed/performed          Past Medical History:  Diagnosis Date   Alcoholism (HCC)    Cancer of uvula (HCC)    2021   Heart murmur    Hypertension    Hypothyroidism    Past Surgical History:  Procedure Laterality Date   COLONOSCOPY     KNEE SURGERY Left    x 2   TONSILLECTOMY     TOTAL KNEE ARTHROPLASTY Left 06/04/2024   Procedure: ARTHROPLASTY, KNEE, TOTAL;  Surgeon: Ernie Cough, MD;  Location: [facility];  Service: Orthopedics;  Laterality: Left;   Patient Active Problem List   Diagnosis Date Noted   S/P total knee arthroplasty, left 06/04/2024   Elevated troponin 07/11/2014   Syncope 07/11/2014   PTSD (post-traumatic stress disorder) 10/31/2013   Alcoholism in family 10/31/2013   Dementia (HCC) 10/09/2013   HLD (hyperlipidemia) 02/27/2013   Osteopenia 02/27/2013   Alcohol  dependence (HCC) 06/01/2012   Major depression 06/01/2012   Hypothyroidism 02/23/2009   Essential hypertension 02/23/2009   HYPERGLYCEMIA 02/23/2009    PCP: Aisha Harvey MD  REFERRING PROVIDER: Ernie Cough MD  REFERRING DIAG: [phone] presence of left artificial knee joint  THERAPY DIAG:  Left knee pain; left knee stiffness Rationale for Evaluation and Treatment: Rehabilitation  ONSET DATE: surgery 06/04/24  SUBJECTIVE:   SUBJECTIVE STATEMENT: Saw the ortho and they said it wasn't infected and that spot is from the swelling causing a skin tear.  Will follow up in 2 weeks.  I've been doing a lot this morning and I'm a little short of breath. She is able to sit/rest and feels recovered.    PERTINENT HISTORY: Hx of Lt TKR 06/04/24 HTN; osteopenia; depression  Has [employer] membership  PAIN:   Are you having pain? Yes NPRS scale: 6/10 Pain location: Left Pain orientation: Left  PAIN TYPE: aching Pain description: intermittent  Aggravating factors: moving Relieving factors: recliner with leg elevated   PRECAUTIONS: None     WEIGHT BEARING RESTRICTIONS: No  FALLS:  Has patient fallen in last 6 months? No  LIVING ENVIRONMENT: Lives with: lives alone but friend living with her now to help Lives in: House/apartment Stairs: Yes: External: 4 steps; on right going up Has following equipment at home: Single point cane and Environmental Consultant - 2 wheeled  OCCUPATION: retired but active doing service work Biochemist, Clinical)  PLOF: Independent and Leisure: trail walking (3 miles)  PATIENT GOALS: be able to travel on an airplane by [employer]  NEXT MD VISIT: 06/20/24  OBJECTIVE:  Note: Objective measures were completed at Evaluation unless otherwise noted.  DIAGNOSTIC FINDINGS: OA left knee  PATIENT SURVEYS:  The Patient-Specific Functional Scale  Initial:  I am going to ask you to identify up to 3 important activities that you are unable to do or are having difficulty with as a result of this problem.  Today are there  any activities that you are unable to do or having difficulty with because of this?  (Patient shown scale and patient rated each activity)  Follow up: When you first came in you had difficulty performing these activities.  Today do you still have difficulty?  Patient-Specific activity scoring scheme (Point to one number):  0 1 2 3 4 5 6 7 8 9  10 Unable                                                                                                          Able to perform To perform                                                                                                     activity at the same Activity         Level as before                                                                                                                       Injury or problem  Activity  Get in/out of bed  Initial:     2                  follow up:  2.  Get in/out of chair  Initial:      2                 follow up:  3.  Walk to bathroom  Initial:      2                 follow up:     COGNITION: Overall cognitive status: Within functional limits for tasks assessed     SENSATION: Anterior thigh numbness  EDEMA:  Eval:  Circumferential: Rt 35cm, Lt 40.5cm with ACE bandage   LOWER EXTREMITY ROM:  Active ROM Right eval Left eval Left 06/17/24 11/21 Left 06/24/24  Hip flexion       Hip extension       Hip abduction       Hip adduction       Hip internal rotation  Hip external rotation       Knee flexion  30 A/ROM 55 P/ROM - limited by pain in quad 95  106  Knee extension  -12 -12 -10 -8  Ankle dorsiflexion       Ankle plantarflexion       Ankle inversion       Ankle eversion        (Blank rows = not tested)  LOWER EXTREMITY MMT: Significant weakness of Lt hip and knee - mild quad set activation on Lt Unable to move Lt leg without external support unless in anti-gravity position Lt hip 2+/5 Lt knee 2+/5   FUNCTIONAL TESTS:  Eval: Sit to stand: heavy use of bil UE on armrests, using primarily Rt LE and kicks Lt LE out Transfers: needs external support or max A to transfer Lt LE into bed/onto table  06/19/2024: Timed up and Go (TUG):  21.96 sec with SPC 3 minute walk test: 358 ft with SPC  GAIT: Distance walked:  Assistive device utilized: Environmental Consultant - 2 wheeled Level of assistance: Modified independence Comments: ambulates with Lt LE straight                                                                                                                                 TREATMENT DATE:    06/24/2024: Nustep level 3 x5 min with PT present to discuss status Recumbent bike level 1 x3 min with PT present to monitor (able to complete full revolution) Seated LAQ 2x10 left LE Seated left quad set with foot resting on 6 step 2x10 Seated heel slide with foot on slider x20 Standing TKE with green tband 2x10 left LE Seated hamstring curl with green tband 2x10 bilat Sit to/from stand with staggered stance (RLE slightly ahead) 2x10 Standing at barre:  high marching, hip abduction, hip extension, heel raise hamstring curl.  X10 each bilat Vasocompression:  Game Ready to left knee x10 min with 3 snowflakes with medium compression   06/21/2024: Nustep level 1 x6 min with seat set on 11 to encourage knee extension with PT present to discuss status Manual:; supine grade 2 extension mobilization 10x; gentle HS stretch in supine 3x Sit to/from stand x10 with cuing for increased use of left LE Seated LAQ 1x10 left LE; heel prop on 6 inch step with heel lift offs 10x, 3 sec holds  Seated heel sliders for flexion x10 Standing TKEs green band 2x10 Retro stepping 45 sec shifting weight onto left WB on left: right forward toe taps 8x, side toe taps 8x, 3 way toe taps 8x  Reports increased exertion and requests a rest break Supine active quad set 15x Vasocompression:  Game Ready to left knee x10 min with 3 snowflakes with medium compression  06/19/2024: Nustep level 3 x6 min with PT present to discuss status Sit to/from stand x10 with cuing for increased use of left LE Skin assessment:  noted red area with scab  on lateral side of bandage with some red areas on the medial aspect of bandage.  Slight warmth noted.  Pt to see ortho office today Seated LAQ 2x10 left LE Seated quad set with lower leg propped on PT's leg x10 left LE Seated with left ankle propped on walker with 3# ankle weight on knee to encourage knee extension x3 min Seated heel slides x10 Timed up and Go (TUG):  21.96 sec  with SPC 3 minute walk test: 358 ft with SPC Standing at barre:  high marching, hip abduction, hamstring curl.  X10 bilat Vasocompression:  Game Ready to left knee x10 min with 3 snowflakes with medium compression   PATIENT EDUCATION:  Education details: Educated patient on anatomy and physiology of current symptoms, prognosis, plan of care as well as initial self care strategies to promote recovery Person educated: Patient Education method: Explanation Education comprehension: verbalized understanding  HOME EXERCISE PROGRAM: Access Code: [email or web address] URL: [email or web address] Date: 06/24/2024 Prepared by: Jarrell Shakyla Nolley  Exercises - Supine Quad Set  - 1 x daily - 7 x weekly - 2 sets - 10 reps - 3 sec hold - Supine Hip Abduction  - 1 x daily - 7 x weekly - 2 sets - 10 reps - Supine Knee Extension Stretch on Towel Roll  - 1 x daily - 7 x weekly - 3 sets - 10 reps - Seated Heel Slide  - 1 x daily - 7 x weekly - 3 sets - 10 reps - Supine Heel Slide with Strap  - 1 x daily - 7 x weekly - 2 sets - 10 reps - Small Range Straight Leg Raise  - 1 x daily - 7 x weekly - 2 sets - 10 reps - Sit to Stand with Armchair  - 1 x daily - 7 x weekly - 2 sets - 5 reps - Standing March with Counter Support  - 1 x daily - 7 x weekly - 1-2 sets - 10 reps - Standing Hip Abduction with Counter Support  - 1 x daily - 7 x weekly - 1-2 sets - 10 reps - Heel Raises with Counter Support  - 1 x daily - 7 x weekly - 1-2 sets - 10 reps - Standing Hip Extension with Counter Support  - 1 x daily - 7 x weekly - 1-2 sets - 10 reps - Standing Knee Flexion with Counter Support  - 1 x daily - 7 x weekly - 1-2 sets - 10 reps  ASSESSMENT:  CLINICAL IMPRESSION:  Ms Jergens presents to skilled PT reporting that her leg is feeling better since she no longer has to wear the compression stockings.  Patient states that her incision is still healing.  Patient able to initiate use of recumbent bike today and perform a  full rotation.  Patient with noted increased left knee A/ROM during visit today and states that she has been doing her exercises at home.  Patient able to progress with standing exercises during session today and provided an updated handout to patient to perform at home.  Patient is progressing with increased exercise and standing tolerance.  Performed sit to stand with staggered stance to encourage increased weight bearing through left LE.  Patient continues to report that Game Ready assists with decreased pain and edema.  Patient continues to require skilled PT to progress towards goal related activities.   OBJECTIVE IMPAIRMENTS: decreased activity tolerance, decreased balance, decreased mobility, difficulty walking, decreased ROM, decreased strength, increased edema, impaired perceived functional ability,  and pain.   ACTIVITY LIMITATIONS: bending, standing, squatting, stairs, transfers, bathing, toileting, dressing, hygiene/grooming, and locomotion level  PARTICIPATION LIMITATIONS: meal prep, cleaning, laundry, driving, shopping, and community activity  PERSONAL FACTORS: 1-2 comorbidities: HTN are also affecting patient's functional outcome.   REHAB POTENTIAL: Good  CLINICAL DECISION MAKING: Stable/uncomplicated  EVALUATION COMPLEXITY: Low   GOALS: Goals reviewed with patient? Yes  SHORT TERM GOALS: Target date: 07/05/2024   The patient will demonstrate knowledge of basic self care strategies and exercises to promote healing  Baseline: Goal status: met 11/21  2.  Pt will be able to perform sit to stand with equal WB through bil LE with UE assist Baseline:  Goal status: Ongoing  3.  Pt will improve Lt knee A/ROM measured in supine to -4 to 90 deg Baseline:  Goal status: Ongoing (see above)  4.  Pt will demo symmetrical gait with walker  Baseline:  Goal status: met 11/21    LONG TERM GOALS: Target date: 08/02/2024    The patient will be independent in a safe self progression  of a home exercise program to promote further recovery of function  Baseline:  Goal status: Ongoing  2.  The patient will report a 60% improvement in pain levels with functional activities which are currently difficult including transfers, stairs, and bed mobility Baseline:  Goal status: INITIAL  3.  The patient will have improved LE strength of at least 4+/5 needed to ascend and descend steps reciprocally  Baseline:  Goal status: Ongoing  4.  Pt will be able to perform household ambulation and short distance community ambulation with LRAD. Baseline:  Goal status: Ongoing      PLAN:  PT FREQUENCY: 3x/week  PT DURATION: 8 weeks  PLANNED INTERVENTIONS: 97164- PT Re-evaluation, 97750- Physical Performance Testing, 97110-Therapeutic exercises, 97530- Therapeutic activity, [id number]- Neuromuscular re-education, 97535- Self Care, [id number]- Manual therapy, [id number]- Aquatic Therapy, [id number]- Electrical stimulation (unattended), [phone]- Electrical stimulation (manual), 97016- Vasopneumatic device, Patient/Family education, Balance training, Stair training, Taping, Joint mobilization, Cryotherapy, and Moist heat  PLAN FOR NEXT SESSION: monitor exertion level, monitor return of Lt hip and knee strength; NuStep, gait training, knee ROM flexion and extension; LE strengthening, manual techniques, vasocompression, ask about MD appointment   Jarrell Laming, PT, DPT 06/24/24, 1:38 PM  [facility] [address], [address] [place], [place] [address] Phone # [phone] Fax [phone]

## 2024-06-26 ENCOUNTER — Other Ambulatory Visit (HOSPITAL_COMMUNITY): Payer: Self-pay | Admitting: Student

## 2024-06-26 ENCOUNTER — Ambulatory Visit: Admitting: Rehabilitative and Restorative Service Providers"

## 2024-06-26 ENCOUNTER — Ambulatory Visit (HOSPITAL_COMMUNITY)
Admission: RE | Admit: 2024-06-26 | Discharge: 2024-06-26 | Disposition: A | Discharge status: Home / Self Care | Source: Ambulatory Visit | Attending: Vascular Surgery | Admitting: Vascular Surgery

## 2024-06-26 ENCOUNTER — Encounter: Payer: Self-pay | Admitting: Rehabilitative and Restorative Service Providers"

## 2024-06-26 DIAGNOSIS — M79605 Pain in left leg: Secondary | ICD-10-CM

## 2024-06-26 DIAGNOSIS — M25562 Pain in left knee: Secondary | ICD-10-CM

## 2024-06-26 DIAGNOSIS — R2689 Other abnormalities of gait and mobility: Secondary | ICD-10-CM

## 2024-06-26 DIAGNOSIS — R600 Localized edema: Secondary | ICD-10-CM

## 2024-06-26 DIAGNOSIS — M6281 Muscle weakness (generalized): Secondary | ICD-10-CM

## 2024-06-26 NOTE — Therapy (Signed)
 OUTPATIENT PHYSICAL THERAPY TREATMENT NOTE   Patient Name: Mary Graves MRN: [id number] DOB:22-Aug-1945, 78 y.o., female Today's Date: 06/26/2024  END OF SESSION:  PT End of Session - 06/26/24 1021     Visit Number 7    Date for Recertification  08/05/24    Authorization Type [employer]    Progress Note Due on Visit 10    PT Start Time 1015    PT Stop Time 1108    PT Time Calculation (min) 53 min    Activity Tolerance Patient tolerated treatment well    Behavior During Therapy WFL for tasks assessed/performed          Past Medical History:  Diagnosis Date   Alcoholism (HCC)    Cancer of uvula (HCC)    2021   Heart murmur    Hypertension    Hypothyroidism    Past Surgical History:  Procedure Laterality Date   COLONOSCOPY     KNEE SURGERY Left    x 2   TONSILLECTOMY     TOTAL KNEE ARTHROPLASTY Left 06/04/2024   Procedure: ARTHROPLASTY, KNEE, TOTAL;  Surgeon: Ernie Cough, MD;  Location: [facility];  Service: Orthopedics;  Laterality: Left;   Patient Active Problem List   Diagnosis Date Noted   S/P total knee arthroplasty, left 06/04/2024   Elevated troponin 07/11/2014   Syncope 07/11/2014   PTSD (post-traumatic stress disorder) 10/31/2013   Alcoholism in family 10/31/2013   Dementia (HCC) 10/09/2013   HLD (hyperlipidemia) 02/27/2013   Osteopenia 02/27/2013   Alcohol  dependence (HCC) 06/01/2012   Major depression 06/01/2012   Hypothyroidism 02/23/2009   Essential hypertension 02/23/2009   HYPERGLYCEMIA 02/23/2009    PCP: Aisha Harvey MD  REFERRING PROVIDER: Ernie Cough MD  REFERRING DIAG: [phone] presence of left artificial knee joint  THERAPY DIAG:  Left knee pain; left knee stiffness Rationale for Evaluation and Treatment: Rehabilitation  ONSET DATE: surgery 06/04/24  SUBJECTIVE:   SUBJECTIVE STATEMENT: Patient reports that she had some knee pain this morning, but it felt better after the cold pack and moving it around.  Patient does  report her primary complaint of feeling short of breath with ambulation.   PERTINENT HISTORY: Hx of Lt TKR 06/04/24 HTN; osteopenia; depression  Has [employer] membership  PAIN:   Are you having pain? Yes NPRS scale: 2/10 Pain location: Left Pain orientation: Left  PAIN TYPE: aching Pain description: intermittent  Aggravating factors: moving Relieving factors: recliner with leg elevated   PRECAUTIONS: None     WEIGHT BEARING RESTRICTIONS: No  FALLS:  Has patient fallen in last 6 months? No  LIVING ENVIRONMENT: Lives with: lives alone but friend living with her now to help Lives in: House/apartment Stairs: Yes: External: 4 steps; on right going up Has following equipment at home: Single point cane and Environmental Consultant - 2 wheeled  OCCUPATION: retired but active doing service work Biochemist, Clinical)  PLOF: Independent and Leisure: trail walking (3 miles)  PATIENT GOALS: be able to travel on an airplane by [employer]  NEXT MD VISIT: 06/20/24  OBJECTIVE:  Note: Objective measures were completed at Evaluation unless otherwise noted.  DIAGNOSTIC FINDINGS: OA left knee  PATIENT SURVEYS:  The Patient-Specific Functional Scale  Initial:  I am going to ask you to identify up to 3 important activities that you are unable to do or are having difficulty with as a result of this problem.  Today are there any activities that you are unable to do or having difficulty with because of  this?  (Patient shown scale and patient rated each activity)  Follow up: When you first came in you had difficulty performing these activities.  Today do you still have difficulty?  Patient-Specific activity scoring scheme (Point to one number):  0 1 2 3 4 5 6 7 8 9  10 Unable                                                                                                          Able to perform To perform                                                                                                     activity at the same Activity         Level as before                                                                                                                       Injury or problem  Activity  Get in/out of bed  Initial:     2                  follow up:  2.  Get in/out of chair  Initial:      2                 follow up:  3.  Walk to bathroom  Initial:      2                 follow up:     COGNITION: Overall cognitive status: Within functional limits for tasks assessed     SENSATION: Anterior thigh numbness  EDEMA:  Eval:  Circumferential: Rt 35cm, Lt 40.5cm with ACE bandage   LOWER EXTREMITY ROM:  Active ROM Right eval Left eval Left 06/17/24 11/21 Left 06/24/24  Hip flexion       Hip extension       Hip abduction       Hip adduction       Hip internal rotation       Hip external rotation  Knee flexion  30 A/ROM 55 P/ROM - limited by pain in quad 95  106  Knee extension  -12 -12 -10 -8  Ankle dorsiflexion       Ankle plantarflexion       Ankle inversion       Ankle eversion        (Blank rows = not tested)  LOWER EXTREMITY MMT: Significant weakness of Lt hip and knee - mild quad set activation on Lt Unable to move Lt leg without external support unless in anti-gravity position Lt hip 2+/5 Lt knee 2+/5   FUNCTIONAL TESTS:  Eval: Sit to stand: heavy use of bil UE on armrests, using primarily Rt LE and kicks Lt LE out Transfers: needs external support or max A to transfer Lt LE into bed/onto table  06/19/2024: Timed up and Go (TUG):  21.96 sec with SPC 3 minute walk test: 358 ft with SPC  GAIT: Distance walked:  Assistive device utilized: Environmental Consultant - 2 wheeled Level of assistance: Modified independence Comments: ambulates with Lt LE straight                                                                                                                                 TREATMENT DATE:   06/26/2024: Nustep level 4 x4 min with PT present  to discuss status Seated LAQ 2x10 left LE Seated left quad set with foot resting on 6 step 2x10 Slight positive Homan's on the left calf.  Notified Rosina Calin, PA-C and she advised it was okay to continue PT Seated heel slide with foot on slider x20 Recumbent bike level 1 x4 min with PT present to monitor Standing at barre:  high marching, hip abduction, (seated recovery) hip extension, heel raise, hamstring curl.  X10 each bilat Sit to/from stand with staggered stance (RLE slightly ahead) 2x10   06/24/2024: Nustep level 3 x5 min with PT present to discuss status Recumbent bike level 1 x3 min with PT present to monitor (able to complete full revolution) Seated LAQ 2x10 left LE Seated left quad set with foot resting on 6 step 2x10 Seated heel slide with foot on slider x20 Standing TKE with green tband 2x10 left LE Seated hamstring curl with green tband 2x10 bilat Sit to/from stand with staggered stance (RLE slightly ahead) 2x10 Standing at barre:  high marching, hip abduction, hip extension, heel raise, hamstring curl.  X10 each bilat Vasocompression:  Game Ready to left knee x10 min with 3 snowflakes with medium compression   06/21/2024: Nustep level 1 x6 min with seat set on 11 to encourage knee extension with PT present to discuss status Manual:; supine grade 2 extension mobilization 10x; gentle HS stretch in supine 3x Sit to/from stand x10 with cuing for increased use of left LE Seated LAQ 1x10 left LE; heel prop on 6 inch step with heel lift offs 10x, 3 sec holds  Seated heel sliders for flexion x10 Standing  TKEs green band 2x10 Retro stepping 45 sec shifting weight onto left WB on left: right forward toe taps 8x, side toe taps 8x, 3 way toe taps 8x  Reports increased exertion and requests a rest break Supine active quad set 15x Vasocompression:  Game Ready to left knee x10 min with 3 snowflakes with medium compression    PATIENT EDUCATION:  Education details:  Educated patient on anatomy and physiology of current symptoms, prognosis, plan of care as well as initial self care strategies to promote recovery Person educated: Patient Education method: Explanation Education comprehension: verbalized understanding  HOME EXERCISE PROGRAM: Access Code: [email or web address] URL: [email or web address] Date: 06/24/2024 Prepared by: Jarrell Keisean Skowron  Exercises - Supine Quad Set  - 1 x daily - 7 x weekly - 2 sets - 10 reps - 3 sec hold - Supine Hip Abduction  - 1 x daily - 7 x weekly - 2 sets - 10 reps - Supine Knee Extension Stretch on Towel Roll  - 1 x daily - 7 x weekly - 3 sets - 10 reps - Seated Heel Slide  - 1 x daily - 7 x weekly - 3 sets - 10 reps - Supine Heel Slide with Strap  - 1 x daily - 7 x weekly - 2 sets - 10 reps - Small Range Straight Leg Raise  - 1 x daily - 7 x weekly - 2 sets - 10 reps - Sit to Stand with Armchair  - 1 x daily - 7 x weekly - 2 sets - 5 reps - Standing March with Counter Support  - 1 x daily - 7 x weekly - 1-2 sets - 10 reps - Standing Hip Abduction with Counter Support  - 1 x daily - 7 x weekly - 1-2 sets - 10 reps - Heel Raises with Counter Support  - 1 x daily - 7 x weekly - 1-2 sets - 10 reps - Standing Hip Extension with Counter Support  - 1 x daily - 7 x weekly - 1-2 sets - 10 reps - Standing Knee Flexion with Counter Support  - 1 x daily - 7 x weekly - 1-2 sets - 10 reps  ASSESSMENT:  CLINICAL IMPRESSION:  Ms Bewick presents to skilled PT reporting that her pain is decreasing, but she continues to have more shortness of breath and fatigues quickly.  Patient additionally reports that she is having some calf cramping.  This PT assessed and patient had positive Homan's sign on the left calf and negative on the right.  PT contacted Rosina Calin, PA-C with Dr Ernie and she states that it was okay to continue PT, but their office would reach out to patient about further assessment with possible Doppler.  Patient  notified to be awaiting a call from the ortho office.  Patient also states that she had a nebulizer treatment that she was given in the past for respiratory problems that she sometimes experiences in the summer.  Recommended patient speak to MD office about this, patient verbalized understanding.  Patient with O2 sats above 97% throughout session, with HR max of 117 bpm after standing exercises.  Her heart rate decreases to below 100 bpm with minimal seated recovery period.  Patient continues to report that she is having decreased pain with use of Game Ready at end of session.  Patient continues to require skilled PT to progress towards goal related activities.   OBJECTIVE IMPAIRMENTS: decreased activity tolerance, decreased balance, decreased mobility, difficulty walking, decreased ROM, decreased strength, increased  edema, impaired perceived functional ability, and pain.   ACTIVITY LIMITATIONS: bending, standing, squatting, stairs, transfers, bathing, toileting, dressing, hygiene/grooming, and locomotion level  PARTICIPATION LIMITATIONS: meal prep, cleaning, laundry, driving, shopping, and community activity  PERSONAL FACTORS: 1-2 comorbidities: HTN are also affecting patient's functional outcome.   REHAB POTENTIAL: Good  CLINICAL DECISION MAKING: Stable/uncomplicated  EVALUATION COMPLEXITY: Low   GOALS: Goals reviewed with patient? Yes  SHORT TERM GOALS: Target date: 07/05/2024   The patient will demonstrate knowledge of basic self care strategies and exercises to promote healing  Baseline: Goal status: met 11/21  2.  Pt will be able to perform sit to stand with equal WB through bil LE with UE assist Baseline:  Goal status: Ongoing  3.  Pt will improve Lt knee A/ROM measured in supine to -4 to 90 deg Baseline:  Goal status: Ongoing (see above)  4.  Pt will demo symmetrical gait with walker  Baseline:  Goal status: met 11/21    LONG TERM GOALS: Target date: 08/02/2024    The  patient will be independent in a safe self progression of a home exercise program to promote further recovery of function  Baseline:  Goal status: Ongoing  2.  The patient will report a 60% improvement in pain levels with functional activities which are currently difficult including transfers, stairs, and bed mobility Baseline:  Goal status: INITIAL  3.  The patient will have improved LE strength of at least 4+/5 needed to ascend and descend steps reciprocally  Baseline:  Goal status: Ongoing  4.  Pt will be able to perform household ambulation and short distance community ambulation with LRAD. Baseline:  Goal status: Ongoing      PLAN:  PT FREQUENCY: 3x/week  PT DURATION: 8 weeks  PLANNED INTERVENTIONS: 97164- PT Re-evaluation, 97750- Physical Performance Testing, 97110-Therapeutic exercises, 97530- Therapeutic activity, [id number]- Neuromuscular re-education, 97535- Self Care, [id number]- Manual therapy, [id number]- Aquatic Therapy, [id number]- Electrical stimulation (unattended), [phone]- Electrical stimulation (manual), 97016- Vasopneumatic device, Patient/Family education, Balance training, Stair training, Taping, Joint mobilization, Cryotherapy, and Moist heat  PLAN FOR NEXT SESSION: monitor exertion level, monitor return of Lt hip and knee strength; NuStep, gait training, knee ROM flexion and extension; LE strengthening, manual techniques, vasocompression, ask about MD follow up   Jarrell Laming, PT, DPT 06/26/24, 11:08 AM  [facility] [address], [address] [place], [place] [address] Phone # [phone] Fax [phone]

## 2024-07-03 ENCOUNTER — Encounter: Payer: Self-pay | Admitting: Physical Therapy

## 2024-07-03 ENCOUNTER — Ambulatory Visit: Admitting: Physical Therapy

## 2024-07-03 DIAGNOSIS — M25562 Pain in left knee: Secondary | ICD-10-CM | POA: Diagnosis present

## 2024-07-03 DIAGNOSIS — M6281 Muscle weakness (generalized): Secondary | ICD-10-CM | POA: Insufficient documentation

## 2024-07-03 DIAGNOSIS — R600 Localized edema: Secondary | ICD-10-CM | POA: Insufficient documentation

## 2024-07-03 DIAGNOSIS — R2689 Other abnormalities of gait and mobility: Secondary | ICD-10-CM | POA: Insufficient documentation

## 2024-07-03 NOTE — Therapy (Signed)
 OUTPATIENT PHYSICAL THERAPY TREATMENT NOTE   Patient Name: Mary Graves MRN: [id number] DOB:[birth year], 78 y.o., female Today's Date: 07/03/2024  END OF SESSION:  PT End of Session - 07/03/24 1231     Visit Number 8    Date for Recertification  08/05/24    Authorization Type [employer]    Progress Note Due on Visit 10    PT Start Time 1225    PT Stop Time 1310    PT Time Calculation (min) 45 min    Activity Tolerance Patient tolerated treatment well          Past Medical History:  Diagnosis Date   Alcoholism (HCC)    Cancer of uvula (HCC)    2021   Heart murmur    Hypertension    Hypothyroidism    Past Surgical History:  Procedure Laterality Date   COLONOSCOPY     KNEE SURGERY Left    x 2   TONSILLECTOMY     TOTAL KNEE ARTHROPLASTY Left 06/04/2024   Procedure: ARTHROPLASTY, KNEE, TOTAL;  Surgeon: Ernie Cough, MD;  Location: [facility];  Service: Orthopedics;  Laterality: Left;   Patient Active Problem List   Diagnosis Date Noted   S/P total knee arthroplasty, left 06/04/2024   Elevated troponin 07/11/2014   Syncope 07/11/2014   PTSD (post-traumatic stress disorder) 10/31/2013   Alcoholism in family 10/31/2013   Dementia (HCC) 10/09/2013   HLD (hyperlipidemia) 02/27/2013   Osteopenia 02/27/2013   Alcohol  dependence (HCC) 06/01/2012   Major depression 06/01/2012   Hypothyroidism 02/23/2009   Essential hypertension 02/23/2009   HYPERGLYCEMIA 02/23/2009    PCP: Aisha Harvey MD  REFERRING PROVIDER: Ernie Cough MD  REFERRING DIAG: [phone] presence of left artificial knee joint  THERAPY DIAG:  Left knee pain; left knee stiffness Rationale for Evaluation and Treatment: Rehabilitation  ONSET DATE: surgery 06/04/24  SUBJECTIVE:   SUBJECTIVE STATEMENT: Negative US  for DVT.  Still having some calf pain.  I get worn out and winded.  Drove myself today.  No assistive device.    PERTINENT HISTORY: Hx of Lt TKR 06/04/24 HTN; osteopenia;  depression  Has [employer] membership  PAIN:   Are you having pain? Yes NPRS scale: 2-3/10 Pain location: Left Pain orientation: Left  PAIN TYPE: aching Pain description: intermittent  Aggravating factors: moving Relieving factors: recliner with leg elevated   PRECAUTIONS: None     WEIGHT BEARING RESTRICTIONS: No  FALLS:  Has patient fallen in last 6 months? No  LIVING ENVIRONMENT: Lives with: lives alone but friend living with her now to help Lives in: House/apartment Stairs: Yes: External: 4 steps; on right going up Has following equipment at home: Single point cane and Environmental Consultant - 2 wheeled  OCCUPATION: retired but active doing service work Biochemist, [employer])  PLOF: Independent and Leisure: trail walking (3 miles)  PATIENT GOALS: be able to travel on an airplane by [employer]  NEXT MD VISIT: 06/20/24  OBJECTIVE:  Note: Objective measures were completed at Evaluation unless otherwise noted.  DIAGNOSTIC FINDINGS: OA left knee  PATIENT SURVEYS:  The Patient-Specific Functional Scale  Initial:  I am going to ask you to identify up to 3 important activities that you are unable to do or are having difficulty with as a result of this problem.  Today are there any activities that you are unable to do or having difficulty with because of this?  (Patient shown scale and patient rated each activity)  Follow up: When you first came in you had  difficulty performing these activities.  Today do you still have difficulty?  Patient-Specific activity scoring scheme (Point to one number):  0 1 2 3 4 5 6 7 8 9  10 Unable                                                                                                          Able to perform To perform                                                                                                    activity at the same Activity         Level as before                                                                                                                        Injury or problem  Activity  Get in/out of bed  Initial:     2                  follow up:  2.  Get in/out of chair  Initial:      2                 follow up:  3.  Walk to bathroom  Initial:      2                 follow up:     COGNITION: Overall cognitive status: Within functional limits for tasks assessed     SENSATION: Anterior thigh numbness  EDEMA:  Eval:  Circumferential: Rt 35cm, Lt 40.5cm with ACE bandage   LOWER EXTREMITY ROM:  Active ROM Right eval Left eval Left 06/17/24 11/21 Left 06/24/24 12/3  Hip flexion        Hip extension        Hip abduction        Hip adduction        Hip internal rotation        Hip external rotation        Knee flexion  30 A/ROM 55 P/ROM - limited by pain in  quad 95  106 109 seated  Knee extension  -12 -12 -10 -8 -7.5 supine; -15 seated   Ankle dorsiflexion        Ankle plantarflexion        Ankle inversion        Ankle eversion         (Blank rows = not tested)  LOWER EXTREMITY MMT: Significant weakness of Lt hip and knee - mild quad set activation on Lt Unable to move Lt leg without external support unless in anti-gravity position Lt hip 2+/5 Lt knee 2+/5   FUNCTIONAL TESTS:  Eval: Sit to stand: heavy use of bil UE on armrests, using primarily Rt LE and kicks Lt LE out Transfers: needs external support or max A to transfer Lt LE into bed/onto table  06/19/2024: Timed up and Go (TUG):  21.96 sec with SPC 3 minute walk test: 358 ft with SPC  GAIT: Distance walked:  Assistive device utilized: Environmental Consultant - 2 wheeled Level of assistance: Modified independence Comments: ambulates with Lt LE straight                                                                                                                                 TREATMENT DATE:  07/03/2024: Nustep level 3 x10 min with PT present to discuss status  Manual: grade 2 extension mobs 10 sec 10 rounds, patellar mobs  particularly inferior direction to help with knee extension Manual: contract relax HS to encourage HS lengthening; knee flexion passively 10x; knee flexion overpressure 10x Knee ROM as above Seated LAQ  2#  2x10 left LE Leg press seat 8 ( move seat closer next time) 65# bil 15x; left only 30# 15x Resisted backwards cable walk 10# with belt with cues for bigger steps to encourage increase knee extension and quad muscle activation 7 reps Vasocompression:  Game Ready to left knee x10 min with 3 snowflakes with medium compression  06/26/2024: Nustep level 4 x4 min with PT present to discuss status Seated LAQ 2x10 left LE Seated left quad set with foot resting on 6 step 2x10 Slight positive Homan's on the left calf.  Notified Rosina Calin, PA-C and she advised it was okay to continue PT Seated heel slide with foot on slider x20 Recumbent bike level 1 x4 min with PT present to monitor Standing at barre:  high marching, hip abduction, (seated recovery) hip extension, heel raise, hamstring curl.  X10 each bilat Sit to/from stand with staggered stance (RLE slightly ahead) 2x10   06/24/2024: Nustep level 3 x5 min with PT present to discuss status Recumbent bike level 1 x3 min with PT present to monitor (able to complete full revolution) Seated LAQ 2x10 left LE Seated left quad set with foot resting on 6 step 2x10 Seated heel slide with foot on slider x20 Standing TKE with green tband 2x10 left LE Seated hamstring curl with green tband 2x10 bilat Sit to/from stand with  staggered stance (RLE slightly ahead) 2x10 Standing at barre:  high marching, hip abduction, hip extension, heel raise, hamstring curl.  X10 each bilat Vasocompression:  Game Ready to left knee x10 min with 3 snowflakes with medium compression   06/21/2024: Nustep level 1 x6 min with seat set on 11 to encourage knee extension with PT present to discuss status Manual:; supine grade 2 extension mobilization 10x; gentle HS  stretch in supine 3x Sit to/from stand x10 with cuing for increased use of left LE Seated LAQ 1x10 left LE; heel prop on 6 inch step with heel lift offs 10x, 3 sec holds  Seated heel sliders for flexion x10 Standing TKEs green band 2x10 Retro stepping 45 sec shifting weight onto left WB on left: right forward toe taps 8x, side toe taps 8x, 3 way toe taps 8x  Reports increased exertion and requests a rest break Supine active quad set 15x Vasocompression:  Game Ready to left knee x10 min with 3 snowflakes with medium compression    PATIENT EDUCATION:  Education details: Educated patient on anatomy and physiology of current symptoms, prognosis, plan of care as well as initial self care strategies to promote recovery Person educated: Patient Education method: Explanation Education comprehension: verbalized understanding  HOME EXERCISE PROGRAM: Access Code: [email or web address] URL: [email or web address] Date: 06/24/2024 Prepared by: Jarrell Menke  Exercises - Supine Quad Set  - 1 x daily - 7 x weekly - 2 sets - 10 reps - 3 sec hold - Supine Hip Abduction  - 1 x daily - 7 x weekly - 2 sets - 10 reps - Supine Knee Extension Stretch on Towel Roll  - 1 x daily - 7 x weekly - 3 sets - 10 reps - Seated Heel Slide  - 1 x daily - 7 x weekly - 3 sets - 10 reps - Supine Heel Slide with Strap  - 1 x daily - 7 x weekly - 2 sets - 10 reps - Small Range Straight Leg Raise  - 1 x daily - 7 x weekly - 2 sets - 10 reps - Sit to Stand with Armchair  - 1 x daily - 7 x weekly - 2 sets - 5 reps - Standing March with Counter Support  - 1 x daily - 7 x weekly - 1-2 sets - 10 reps - Standing Hip Abduction with Counter Support  - 1 x daily - 7 x weekly - 1-2 sets - 10 reps - Heel Raises with Counter Support  - 1 x daily - 7 x weekly - 1-2 sets - 10 reps - Standing Hip Extension with Counter Support  - 1 x daily - 7 x weekly - 1-2 sets - 10 reps - Standing Knee Flexion with Counter Support  - 1 x daily - 7  x weekly - 1-2 sets - 10 reps  ASSESSMENT:  CLINICAL IMPRESSION: Steadily improving knee ROM although knee extension deficit persists secondary to edema, shortened soft tissue and decreased quad motor control at endrange.  Treatment interventions selected to emphasize extension and quad muscle activation.  Much improved exercise capacity today therefore able to progress challenge level of ex's.  Therapist monitoring response to all interventions and modifying treatment accordingly.     OBJECTIVE IMPAIRMENTS: decreased activity tolerance, decreased balance, decreased mobility, difficulty walking, decreased ROM, decreased strength, increased edema, impaired perceived functional ability, and pain.   ACTIVITY LIMITATIONS: bending, standing, squatting, stairs, transfers, bathing, toileting, dressing, hygiene/grooming, and locomotion level  PARTICIPATION LIMITATIONS: meal prep, cleaning, laundry,  driving, shopping, and community activity  PERSONAL FACTORS: 1-2 comorbidities: HTN are also affecting patient's functional outcome.   REHAB POTENTIAL: Good  CLINICAL DECISION MAKING: Stable/uncomplicated  EVALUATION COMPLEXITY: Low   GOALS: Goals reviewed with patient? Yes  SHORT TERM GOALS: Target date: 07/05/2024   The patient will demonstrate knowledge of basic self care strategies and exercises to promote healing  Baseline: Goal status: met 11/21  2.  Pt will be able to perform sit to stand with equal WB through bil LE with UE assist Baseline:  Goal status: met 12/3  3.  Pt will improve Lt knee A/ROM measured in supine to -4 to 90 deg Baseline:  Goal status: Ongoing (see above)  4.  Pt will demo symmetrical gait with walker  Baseline:  Goal status: met 11/21    LONG TERM GOALS: Target date: 08/02/2024    The patient will be independent in a safe self progression of a home exercise program to promote further recovery of function  Baseline:  Goal status: Ongoing  2.  The  patient will report a 60% improvement in pain levels with functional activities which are currently difficult including transfers, stairs, and bed mobility Baseline:  Goal status: INITIAL  3.  The patient will have improved LE strength of at least 4+/5 needed to ascend and descend steps reciprocally  Baseline:  Goal status: Ongoing  4.  Pt will be able to perform household ambulation and short distance community ambulation with LRAD. Baseline:  Goal status: Ongoing      PLAN:  PT FREQUENCY: 3x/week  PT DURATION: 8 weeks  PLANNED INTERVENTIONS: 97164- PT Re-evaluation, 97750- Physical Performance Testing, 97110-Therapeutic exercises, 97530- Therapeutic activity, 97112- Neuromuscular re-education, 97535- Self Care, 02859- Manual therapy, [phone]- Aquatic Therapy, G0283- Electrical stimulation (unattended), [phone]- Electrical stimulation (manual), 97016- Vasopneumatic device, Patient/Family education, Balance training, Stair training, Taping, Joint mobilization, Cryotherapy, and Moist heat  PLAN FOR NEXT SESSION:see how MD appt goes Friday/x-ray;  monitor exertion level, NuStep, gait training, knee ROM flexion and extension empasis; LE strengthening, manual techniques, vasocompression   Glade Pesa, PT 07/03/24 2:05 PM Phone: [phone] Fax: [phone]  [facility] [address], [address] [place], [place] [address] Phone # [phone] Fax [phone]

## 2024-07-05 ENCOUNTER — Ambulatory Visit: Admitting: Physical Therapy

## 2024-07-08 DIAGNOSIS — Z96652 Presence of left artificial knee joint: Secondary | ICD-10-CM | POA: Diagnosis not present

## 2024-07-08 DIAGNOSIS — Z471 Aftercare following joint replacement surgery: Secondary | ICD-10-CM | POA: Diagnosis not present

## 2024-07-10 ENCOUNTER — Encounter: Payer: Self-pay | Admitting: Physical Therapy

## 2024-07-10 ENCOUNTER — Ambulatory Visit: Admitting: Physical Therapy

## 2024-07-10 DIAGNOSIS — R600 Localized edema: Secondary | ICD-10-CM

## 2024-07-10 DIAGNOSIS — R2689 Other abnormalities of gait and mobility: Secondary | ICD-10-CM

## 2024-07-10 DIAGNOSIS — M25562 Pain in left knee: Secondary | ICD-10-CM

## 2024-07-10 DIAGNOSIS — M6281 Muscle weakness (generalized): Secondary | ICD-10-CM

## 2024-07-10 NOTE — Therapy (Signed)
 OUTPATIENT PHYSICAL THERAPY TREATMENT NOTE   Patient Name: Mary Graves MRN: [id number] DOB:Apr 18, 1946, 78 y.o., female Today's Date: 07/10/2024  END OF SESSION:  PT End of Session - 07/10/24 1151     Visit Number 9    Date for Recertification  08/05/24    Authorization Type [employer]    Progress Note Due on Visit 10    PT Start Time 1149    PT Stop Time 1230    PT Time Calculation (min) 41 min    Activity Tolerance Patient tolerated treatment well          Past Medical History:  Diagnosis Date   Alcoholism (HCC)    Cancer of uvula (HCC)    2021   Heart murmur    Hypertension    Hypothyroidism    Past Surgical History:  Procedure Laterality Date   COLONOSCOPY     KNEE SURGERY Left    x 2   TONSILLECTOMY     TOTAL KNEE ARTHROPLASTY Left 06/04/2024   Procedure: ARTHROPLASTY, KNEE, TOTAL;  Surgeon: Ernie Cough, MD;  Location: [facility];  Service: Orthopedics;  Laterality: Left;   Patient Active Problem List   Diagnosis Date Noted   S/P total knee arthroplasty, left 06/04/2024   Elevated troponin 07/11/2014   Syncope 07/11/2014   PTSD (post-traumatic stress disorder) 10/31/2013   Alcoholism in family 10/31/2013   Dementia (HCC) 10/09/2013   HLD (hyperlipidemia) 02/27/2013   Osteopenia 02/27/2013   Alcohol  dependence (HCC) 06/01/2012   Major depression 06/01/2012   Hypothyroidism 02/23/2009   Essential hypertension 02/23/2009   HYPERGLYCEMIA 02/23/2009    PCP: Aisha Harvey MD  REFERRING PROVIDER: Ernie Cough MD  REFERRING DIAG: [phone] presence of left artificial knee joint  THERAPY DIAG:  Left knee pain; left knee stiffness Rationale for Evaluation and Treatment: Rehabilitation  ONSET DATE: surgery 06/04/24  SUBJECTIVE:   SUBJECTIVE STATEMENT: The doctor told me to keep working that knee all the time even after PT ends.  I'd like to consolidate my HEP.   PERTINENT HISTORY: Hx of Lt TKR 06/04/24 HTN; osteopenia; depression  Has  [employer] membership  PAIN:   Are you having pain? Yes NPRS scale: 2/10 Pain location: Left Pain orientation: Left  PAIN TYPE: aching Pain description: intermittent  Aggravating factors: moving Relieving factors: recliner with leg elevated   PRECAUTIONS: None     WEIGHT BEARING RESTRICTIONS: No  FALLS:  Has patient fallen in last 6 months? No  LIVING ENVIRONMENT: Lives with: lives alone but friend living with her now to help Lives in: House/apartment Stairs: Yes: External: 4 steps; on right going up Has following equipment at home: Single point cane and Environmental Consultant - 2 wheeled  OCCUPATION: retired but active doing service work Biochemist, Clinical)  PLOF: Independent and Leisure: trail walking (3 miles)  PATIENT GOALS: be able to travel on an airplane by [employer]  NEXT MD VISIT: 06/20/24  OBJECTIVE:  Note: Objective measures were completed at Evaluation unless otherwise noted.  DIAGNOSTIC FINDINGS: OA left knee  PATIENT SURVEYS:  The Patient-Specific Functional Scale  Initial:  I am going to ask you to identify up to 3 important activities that you are unable to do or are having difficulty with as a result of this problem.  Today are there any activities that you are unable to do or having difficulty with because of this?  (Patient shown scale and patient rated each activity)  Follow up: When you first came in you had difficulty performing these  activities.  Today do you still have difficulty?  Patient-Specific activity scoring scheme (Point to one number):  0 1 2 3 4 5 6 7 8 9  10 Unable                                                                                                          Able to perform To perform                                                                                                    activity at the same Activity         Level as before                                                                                                                        Injury or problem  Activity  Get in/out of bed  Initial:     2                  follow up:  2.  Get in/out of chair  Initial:      2                 follow up:  3.  Walk to bathroom  Initial:      2                 follow up:     COGNITION: Overall cognitive status: Within functional limits for tasks assessed     SENSATION: Anterior thigh numbness  EDEMA:  Eval:  Circumferential: Rt 35cm, Lt 40.5cm with ACE bandage   LOWER EXTREMITY ROM:  Active ROM Right eval Left eval Left 06/17/24 11/21 Left 06/24/24 12/3  Hip flexion        Hip extension        Hip abduction        Hip adduction        Hip internal rotation        Hip external rotation        Knee flexion  30 A/ROM 55 P/ROM - limited by pain in quad 95  106 109 seated  Knee extension  -12 -12 -10 -8 -7.5 supine; -15 seated   Ankle dorsiflexion        Ankle plantarflexion        Ankle inversion        Ankle eversion         (Blank rows = not tested)  LOWER EXTREMITY MMT: Significant weakness of Lt hip and knee - mild quad set activation on Lt Unable to move Lt leg without external support unless in anti-gravity position Lt hip 2+/5 Lt knee 2+/5   FUNCTIONAL TESTS:  Eval: Sit to stand: heavy use of bil UE on armrests, using primarily Rt LE and kicks Lt LE out Transfers: needs external support or max A to transfer Lt LE into bed/onto table  06/19/2024: Timed up and Go (TUG):  21.96 sec with SPC 3 minute walk test: 358 ft with SPC  GAIT: Distance walked:  Assistive device utilized: Environmental Consultant - 2 wheeled Level of assistance: Modified independence Comments: ambulates with Lt LE straight                                                                                                                                 TREATMENT DATE:  07/10/2024: Bike 4 min full revolutions while discussing status Slant board calf stretch 30 sec holds 5x right/left (Added to HEP- see below) HS stretch on 2nd  step 30 sec holds (Added to HEP- see below) Knee flexion on 2nd step 20x (Added to HEP- see below) Rockerboard 3 minutes PF/DF Up and down 4 steps reciprocally with light UE support (Added to HEP- see below) 6 inch step ups 10x light UE support (Added to HEP- see below) Leg press seat 7: 65# bil 15x; left only 30# 15x Seated LAQ  3#  2x10 left LE Seated knee extension stretch with heel on stool (Added to HEP- see below) Vasocompression:  Game Ready to left knee x10 min with 3 snowflakes with medium compression  07/03/2024: Nustep level 3 x10 min with PT present to discuss status  Manual: grade 2 extension mobs 10 sec 10 rounds, patellar mobs particularly inferior direction to help with knee extension Manual: contract relax HS to encourage HS lengthening; knee flexion passively 10x; knee flexion overpressure 10x Knee ROM as above Seated LAQ  2#  2x10 left LE Leg press seat 8 ( move seat closer next time) 65# bil 15x; left only 30# 15x Resisted backwards cable walk 10# with belt with cues for bigger steps to encourage increase knee extension and quad muscle activation 7 reps Vasocompression:  Game Ready to left knee x10 min with 3 snowflakes with medium compression  06/26/2024: Nustep level 4 x4 min with PT present to discuss status Seated LAQ 2x10 left LE Seated left quad set with foot resting on 6 step 2x10 Slight positive Homan's on the left calf.  Notified Rosina Calin, PA-C and she advised it was okay to continue  PT Seated heel slide with foot on slider x20 Recumbent bike level 1 x4 min with PT present to monitor Standing at barre:  high marching, hip abduction, (seated recovery) hip extension, heel raise, hamstring curl.  X10 each bilat Sit to/from stand with staggered stance (RLE slightly ahead) 2x10   06/24/2024: Nustep level 3 x5 min with PT present to discuss status Recumbent bike level 1 x3 min with PT present to monitor (able to complete full revolution) Seated LAQ 2x10  left LE Seated left quad set with foot resting on 6 step 2x10 Seated heel slide with foot on slider x20 Standing TKE with green tband 2x10 left LE Seated hamstring curl with green tband 2x10 bilat Sit to/from stand with staggered stance (RLE slightly ahead) 2x10 Standing at barre:  high marching, hip abduction, hip extension, heel raise, hamstring curl.  X10 each bilat Vasocompression:  Game Ready to left knee x10 min with 3 snowflakes with medium compression   06/21/2024: Nustep level 1 x6 min with seat set on 11 to encourage knee extension with PT present to discuss status Manual:; supine grade 2 extension mobilization 10x; gentle HS stretch in supine 3x Sit to/from stand x10 with cuing for increased use of left LE Seated LAQ 1x10 left LE; heel prop on 6 inch step with heel lift offs 10x, 3 sec holds  Seated heel sliders for flexion x10 Standing TKEs green band 2x10 Retro stepping 45 sec shifting weight onto left WB on left: right forward toe taps 8x, side toe taps 8x, 3 way toe taps 8x  Reports increased exertion and requests a rest break Supine active quad set 15x Vasocompression:  Game Ready to left knee x10 min with 3 snowflakes with medium compression    PATIENT EDUCATION:  Education details: Educated patient on anatomy and physiology of current symptoms, prognosis, plan of care as well as initial self care strategies to promote recovery Person educated: Patient Education method: Explanation Education comprehension: verbalized understanding  HOME EXERCISE PROGRAM: Access Code: [email or web address] URL: [email or web address] Date: 07/10/2024 Prepared by: Glade Pesa  Exercises - Supine Quad Set  - 1 x daily - 7 x weekly - 2 sets - 10 reps - 3 sec hold - Supine Hip Abduction  - 1 x daily - 7 x weekly - 2 sets - 10 reps - Supine Knee Extension Stretch on Towel Roll  - 1 x daily - 7 x weekly - 3 sets - 10 reps - Seated Heel Slide  - 1 x daily - 7 x weekly - 3 sets -  10 reps - Supine Heel Slide with Strap  - 1 x daily - 7 x weekly - 2 sets - 10 reps - Small Range Straight Leg Raise  - 1 x daily - 7 x weekly - 2 sets - 10 reps - Sit to Stand with Armchair  - 1 x daily - 7 x weekly - 2 sets - 5 reps - Standing March with Counter Support  - 1 x daily - 7 x weekly - 1-2 sets - 10 reps - Standing Hip Abduction with Counter Support  - 1 x daily - 7 x weekly - 1-2 sets - 10 reps - Heel Raises with Counter Support  - 1 x daily - 7 x weekly - 1-2 sets - 10 reps - Standing Hip Extension with Counter Support  - 1 x daily - 7 x weekly - 1-2 sets - 10 reps - Standing Knee Flexion with Counter Support  - 1 x  daily - 7 x weekly - 1-2 sets - 10 reps - Standing Knee Flexion Stretch on Step  - 1 x daily - 7 x weekly - 1 sets - 10 reps - Standing Hamstring Stretch with Step  - 1 x daily - 7 x weekly - 1 sets - 10 reps - Gastroc Stretch with Foot at Wall  - 1 x daily - 7 x weekly - 1 sets - 10 reps - 30 hold - Forward Step Up (Mirrored)  - 1 x daily - 7 x weekly - 1 sets - 10 reps - Seated Passive Knee Extension  - 1 x daily - 7 x weekly - 1 sets - 3 reps - 60 hold ASSESSMENT:  CLINICAL IMPRESSION: HEP updated with areas of emphasis including knee extension ROM and quad strengthening.  Verbal cues during ex's to optimize her form and avoid compensatory strategies.  She is steadily improving  with mobility, stamina and weight bearing tolerance on left LE.  Decreased edema following vasocompression.  On track with rehab goals.       OBJECTIVE IMPAIRMENTS: decreased activity tolerance, decreased balance, decreased mobility, difficulty walking, decreased ROM, decreased strength, increased edema, impaired perceived functional ability, and pain.   ACTIVITY LIMITATIONS: bending, standing, squatting, stairs, transfers, bathing, toileting, dressing, hygiene/grooming, and locomotion level  PARTICIPATION LIMITATIONS: meal prep, cleaning, laundry, driving, shopping, and community  activity  PERSONAL FACTORS: 1-2 comorbidities: HTN are also affecting patient's functional outcome.   REHAB POTENTIAL: Good  CLINICAL DECISION MAKING: Stable/uncomplicated  EVALUATION COMPLEXITY: Low   GOALS: Goals reviewed with patient? Yes  SHORT TERM GOALS: Target date: 07/05/2024   The patient will demonstrate knowledge of basic self care strategies and exercises to promote healing  Baseline: Goal status: met 11/21  2.  Pt will be able to perform sit to stand with equal WB through bil LE with UE assist Baseline:  Goal status: met 12/3  3.  Pt will improve Lt knee A/ROM measured in supine to -4 to 90 deg Baseline:  Goal status: Ongoing (see above)  4.  Pt will demo symmetrical gait with walker  Baseline:  Goal status: met 11/21    LONG TERM GOALS: Target date: 08/02/2024    The patient will be independent in a safe self progression of a home exercise program to promote further recovery of function  Baseline:  Goal status: Ongoing  2.  The patient will report a 60% improvement in pain levels with functional activities which are currently difficult including transfers, stairs, and bed mobility Baseline:  Goal status: INITIAL  3.  The patient will have improved LE strength of at least 4+/5 needed to ascend and descend steps reciprocally  Baseline:  Goal status: Ongoing  4.  Pt will be able to perform household ambulation and short distance community ambulation with LRAD. Baseline:  Goal status: Ongoing      PLAN:  PT FREQUENCY: 3x/week  PT DURATION: 8 weeks  PLANNED INTERVENTIONS: 97164- PT Re-evaluation, 97750- Physical Performance Testing, 97110-Therapeutic exercises, 97530- Therapeutic activity, 97112- Neuromuscular re-education, 97535- Self Care, 02859- Manual therapy, [phone]- Aquatic Therapy, [id number]- Electrical stimulation (unattended), [phone]- Electrical stimulation (manual), 97016- Vasopneumatic device, Patient/Family education, Balance training, Stair  training, Taping, Joint mobilization, Cryotherapy, and Moist heat  PLAN FOR NEXT SESSION:  10th visit progress note; check knee ROM; TUG and walk test; monitor exertion level, NuStep, gait training, knee ROM flexion and extension empasis; LE strengthening, manual techniques, vasocompression   Glade Pesa, PT 07/10/24 2:02 PM Phone: [phone]  Fax: [phone]  [facility] [address], [address] [place], [place] [address] Phone # [phone] Fax [phone]

## 2024-07-12 ENCOUNTER — Ambulatory Visit: Admitting: Physical Therapy

## 2024-07-12 ENCOUNTER — Encounter: Payer: Self-pay | Admitting: Physical Therapy

## 2024-07-12 DIAGNOSIS — R600 Localized edema: Secondary | ICD-10-CM

## 2024-07-12 DIAGNOSIS — M6281 Muscle weakness (generalized): Secondary | ICD-10-CM

## 2024-07-12 DIAGNOSIS — M25562 Pain in left knee: Secondary | ICD-10-CM | POA: Diagnosis not present

## 2024-07-12 NOTE — Therapy (Signed)
 OUTPATIENT PHYSICAL THERAPY TREATMENT NOTE   Patient Name: Mary Graves MRN: [id number] DOB:September 05, 1945, 78 y.o., female Today's Date: 07/12/2024   Progress Note Reporting Period 11/10 to 07/12/24  See note below for Objective Data and Assessment of Progress/Goals.     END OF SESSION:  PT End of Session - 07/12/24 1020     Visit Number 10    Date for Recertification  08/05/24    Authorization Type [employer]    Progress Note Due on Visit 20    PT Start Time 1016    PT Stop Time 1100    PT Time Calculation (min) 44 min    Activity Tolerance Patient tolerated treatment well          Past Medical History:  Diagnosis Date   Alcoholism (HCC)    Cancer of uvula (HCC)    2021   Heart murmur    Hypertension    Hypothyroidism    Past Surgical History:  Procedure Laterality Date   COLONOSCOPY     KNEE SURGERY Left    x 2   TONSILLECTOMY     TOTAL KNEE ARTHROPLASTY Left 06/04/2024   Procedure: ARTHROPLASTY, KNEE, TOTAL;  Surgeon: Ernie Cough, MD;  Location: [facility];  Service: Orthopedics;  Laterality: Left;   Patient Active Problem List   Diagnosis Date Noted   S/P total knee arthroplasty, left 06/04/2024   Elevated troponin 07/11/2014   Syncope 07/11/2014   PTSD (post-traumatic stress disorder) 10/31/2013   Alcoholism in family 10/31/2013   Dementia (HCC) 10/09/2013   HLD (hyperlipidemia) 02/27/2013   Osteopenia 02/27/2013   Alcohol  dependence (HCC) 06/01/2012   Major depression 06/01/2012   Hypothyroidism 02/23/2009   Essential hypertension 02/23/2009   HYPERGLYCEMIA 02/23/2009    PCP: Aisha Harvey MD  REFERRING PROVIDER: Ernie Cough MD  REFERRING DIAG: [phone] presence of left artificial knee joint  THERAPY DIAG:  Left knee pain; left knee stiffness Rationale for Evaluation and Treatment: Rehabilitation  ONSET DATE: surgery 06/04/24  SUBJECTIVE:   SUBJECTIVE STATEMENT: I iced this morning.    PERTINENT HISTORY: Hx of Lt TKR  06/04/24 HTN; osteopenia; depression  Has [employer] membership  PAIN:   Are you having pain? Yes NPRS scale: 2/10 Pain location: Left Pain orientation: Left  PAIN TYPE: aching Pain description: intermittent  Aggravating factors: moving Relieving factors: recliner with leg elevated   PRECAUTIONS: None     WEIGHT BEARING RESTRICTIONS: No  FALLS:  Has patient fallen in last 6 months? No  LIVING ENVIRONMENT: Lives with: lives alone but friend living with her now to help Lives in: House/apartment Stairs: Yes: External: 4 steps; on right going up Has following equipment at home: Single point cane and Environmental Consultant - 2 wheeled  OCCUPATION: retired but active doing service work Biochemist, [employer])  PLOF: Independent and Leisure: trail walking (3 miles)  PATIENT GOALS: be able to travel on an airplane by [employer]  NEXT MD VISIT: 06/20/24  OBJECTIVE:  Note: Objective measures were completed at Evaluation unless otherwise noted.  DIAGNOSTIC FINDINGS: OA left knee  PATIENT SURVEYS:  The Patient-Specific Functional Scale  Initial:  I am going to ask you to identify up to 3 important activities that you are unable to do or are having difficulty with as a result of this problem.  Today are there any activities that you are unable to do or having difficulty with because of this?  (Patient shown scale and patient rated each activity)  Follow up: When you first came in  you had difficulty performing these activities.  Today do you still have difficulty?  Patient-Specific activity scoring scheme (Point to one number):  0 1 2 3 4 5 6 7 8 9  10 Unable                                                                                                          Able to perform To perform                                                                                                    activity at the same Activity         Level as before                                                                                                                        Injury or problem  Activity  Get in/out of bed  Initial:     2                  follow up: 12/12 8 in bed, 5 out of bed  2.  Get in/out of chair  Initial:      2                 follow up:  12/12 7  3.  Walk to bathroom  Initial:      2                 follow up: 12/12   7     COGNITION: Overall cognitive status: Within functional limits for tasks assessed     SENSATION: Anterior thigh numbness  EDEMA:  Eval:  Circumferential: Rt 35cm, Lt 40.5cm with ACE bandage   LOWER EXTREMITY ROM:  Active ROM Right eval Left eval Left 06/17/24 11/21 Left 06/24/24 12/3 12/12  Hip flexion         Hip extension         Hip abduction         Hip adduction         Hip internal rotation  Hip external rotation         Knee flexion  30 A/ROM 55 P/ROM - limited by pain in quad 95  106 109 seated 114 supine   Knee extension  -12 -12 -10 -8 -7.5 supine; -15 seated   Supine and  -6 seated  Ankle dorsiflexion         Ankle plantarflexion         Ankle inversion         Ankle eversion          (Blank rows = not tested)  LOWER EXTREMITY MMT: Significant weakness of Lt hip and knee - mild quad set activation on Lt Unable to move Lt leg without external support unless in anti-gravity position Lt hip 2+/5 Lt knee 2+/5   FUNCTIONAL TESTS:  Eval: Sit to stand: heavy use of bil UE on armrests, using primarily Rt LE and kicks Lt LE out Transfers: needs external support or max A to transfer Lt LE into bed/onto table  06/19/2024: Timed up and Go (TUG):  21.96 sec with SPC 3 minute walk test: 358 ft with SPC  12/12;  7.90 TUG no device, light use of Ues              3 MWT 654 no device  GAIT: Distance walked:  Assistive device utilized: Environmental Consultant - 2 wheeled Level of assistance: Modified independence Comments: ambulates with Lt LE straight                                                                                                                                  TREATMENT DATE:  07/12/2024: Bike 4 min full revolutions while discussing status PSFS TUG 3 MWT Seated with 5# across thigh, heel on stool for knee extension 2 min discussed options for home to do this LAQ 5# 10x(challenging) HS stretch on 2nd step 30 sec holds  Rockerboard 3 minutes PF/DF 6 inch step ups 20x light UE support  Cable resisted walk 10# with handles (use belt next time) 8x emphasizing knee extension Leg press seat 7: 70# bil 15x; left only 35# 15x Knee ROM as above    07/10/2024: Bike 4 min full revolutions while discussing status Slant board calf stretch 30 sec holds 5x right/left (Added to HEP- see below) HS stretch on 2nd step 30 sec holds (Added to HEP- see below) Knee flexion on 2nd step 20x (Added to HEP- see below) Rockerboard 3 minutes PF/DF Up and down 4 steps reciprocally with light UE support (Added to HEP- see below) 6 inch step ups 10x light UE support (Added to HEP- see below) Leg press seat 7: 65# bil 15x; left only 30# 15x Seated LAQ  3#  2x10 left LE Seated knee extension stretch with heel on stool (Added to HEP- see below) Vasocompression:  Game Ready to left knee x10 min with 3 snowflakes with medium compression  07/03/2024: Nustep level 3 x10 min  with PT present to discuss status  Manual: grade 2 extension mobs 10 sec 10 rounds, patellar mobs particularly inferior direction to help with knee extension Manual: contract relax HS to encourage HS lengthening; knee flexion passively 10x; knee flexion overpressure 10x Knee ROM as above Seated LAQ  2#  2x10 left LE Leg press seat 8 ( move seat closer next time) 65# bil 15x; left only 30# 15x Resisted backwards cable walk 10# with belt with cues for bigger steps to encourage increase knee extension and quad muscle activation 7 reps Vasocompression:  Game Ready to left knee x10 min with 3 snowflakes with medium compression  06/26/2024: Nustep level 4 x4 min  with PT present to discuss status Seated LAQ 2x10 left LE Seated left quad set with foot resting on 6 step 2x10 Slight positive Homan's on the left calf.  Notified Rosina Calin, PA-C and she advised it was okay to continue PT Seated heel slide with foot on slider x20 Recumbent bike level 1 x4 min with PT present to monitor Standing at barre:  high marching, hip abduction, (seated recovery) hip extension, heel raise, hamstring curl.  X10 each bilat Sit to/from stand with staggered stance (RLE slightly ahead) 2x10   06/24/2024: Nustep level 3 x5 min with PT present to discuss status Recumbent bike level 1 x3 min with PT present to monitor (able to complete full revolution) Seated LAQ 2x10 left LE Seated left quad set with foot resting on 6 step 2x10 Seated heel slide with foot on slider x20 Standing TKE with green tband 2x10 left LE Seated hamstring curl with green tband 2x10 bilat Sit to/from stand with staggered stance (RLE slightly ahead) 2x10 Standing at barre:  high marching, hip abduction, hip extension, heel raise, hamstring curl.  X10 each bilat Vasocompression:  Game Ready to left knee x10 min with 3 snowflakes with medium compression     PATIENT EDUCATION:  Education details: Educated patient on anatomy and physiology of current symptoms, prognosis, plan of care as well as initial self care strategies to promote recovery Person educated: Patient Education method: Explanation Education comprehension: verbalized understanding  HOME EXERCISE PROGRAM: Access Code: [email or web address] URL: [email or web address] Date: 07/10/2024 Prepared by: Glade Pesa  Exercises - Supine Quad Set  - 1 x daily - 7 x weekly - 2 sets - 10 reps - 3 sec hold - Supine Hip Abduction  - 1 x daily - 7 x weekly - 2 sets - 10 reps - Supine Knee Extension Stretch on Towel Roll  - 1 x daily - 7 x weekly - 3 sets - 10 reps - Seated Heel Slide  - 1 x daily - 7 x weekly - 3 sets - 10 reps -  Supine Heel Slide with Strap  - 1 x daily - 7 x weekly - 2 sets - 10 reps - Small Range Straight Leg Raise  - 1 x daily - 7 x weekly - 2 sets - 10 reps - Sit to Stand with Armchair  - 1 x daily - 7 x weekly - 2 sets - 5 reps - Standing March with Counter Support  - 1 x daily - 7 x weekly - 1-2 sets - 10 reps - Standing Hip Abduction with Counter Support  - 1 x daily - 7 x weekly - 1-2 sets - 10 reps - Heel Raises with Counter Support  - 1 x daily - 7 x weekly - 1-2 sets - 10 reps - Standing Hip Extension with Counter Support  -  1 x daily - 7 x weekly - 1-2 sets - 10 reps - Standing Knee Flexion with Counter Support  - 1 x daily - 7 x weekly - 1-2 sets - 10 reps - Standing Knee Flexion Stretch on Step  - 1 x daily - 7 x weekly - 1 sets - 10 reps - Standing Hamstring Stretch with Step  - 1 x daily - 7 x weekly - 1 sets - 10 reps - Gastroc Stretch with Foot at Wall  - 1 x daily - 7 x weekly - 1 sets - 10 reps - 30 hold - Forward Step Up (Mirrored)  - 1 x daily - 7 x weekly - 1 sets - 10 reps - Seated Passive Knee Extension  - 1 x daily - 7 x weekly - 1 sets - 3 reps - 60 hold ASSESSMENT:  CLINICAL IMPRESSION: Samary is progressing steadily with gait speed, return to function, quad motor control and ROM.  She continues to have more limitation with knee extension ROM than flexion.  Treatment interventions selected to emphasize terminal knee extension with therapist providing cues.  On track to meet rehab goals.  She plans to ice at home for edema control after some errands.      OBJECTIVE IMPAIRMENTS: decreased activity tolerance, decreased balance, decreased mobility, difficulty walking, decreased ROM, decreased strength, increased edema, impaired perceived functional ability, and pain.   ACTIVITY LIMITATIONS: bending, standing, squatting, stairs, transfers, bathing, toileting, dressing, hygiene/grooming, and locomotion level  PARTICIPATION LIMITATIONS: meal prep, cleaning, laundry, driving,  shopping, and community activity  PERSONAL FACTORS: 1-2 comorbidities: HTN are also affecting patient's functional outcome.   REHAB POTENTIAL: Good  CLINICAL DECISION MAKING: Stable/uncomplicated  EVALUATION COMPLEXITY: Low   GOALS: Goals reviewed with patient? Yes  SHORT TERM GOALS: Target date: 07/05/2024   The patient will demonstrate knowledge of basic self care strategies and exercises to promote healing  Baseline: Goal status: met 11/21  2.  Pt will be able to perform sit to stand with equal WB through bil LE with UE assist Baseline:  Goal status: met 12/3  3.  Pt will improve Lt knee A/ROM measured in supine to -4 to 90 deg Baseline:  Goal status: Ongoing (see above)  4.  Pt will demo symmetrical gait with walker  Baseline:  Goal status: met 11/21    LONG TERM GOALS: Target date: 08/02/2024    The patient will be independent in a safe self progression of a home exercise program to promote further recovery of function  Baseline:  Goal status: Ongoing  2.  The patient will report a 60% improvement in pain levels with functional activities which are currently difficult including transfers, stairs, and bed mobility Baseline:  Goal status: INITIAL  3.  The patient will have improved LE strength of at least 4+/5 needed to ascend and descend steps reciprocally  Baseline:  Goal status: Ongoing  4.  Pt will be able to perform household ambulation and short distance community ambulation with LRAD. Baseline:  Goal status: Ongoing      PLAN:  PT FREQUENCY: 3x/week  PT DURATION: 8 weeks  PLANNED INTERVENTIONS: 97164- PT Re-evaluation, 97750- Physical Performance Testing, 97110-Therapeutic exercises, 97530- Therapeutic activity, [id number]- Neuromuscular re-education, 97535- Self Care, [id number]- Manual therapy, [id number]- Aquatic Therapy, H9716- Electrical stimulation (unattended), [phone]- Electrical stimulation (manual), 97016- Vasopneumatic device, Patient/Family education,  Balance training, Stair training, Taping, Joint mobilization, Cryotherapy, and Moist heat  PLAN FOR NEXT SESSION:   monitor exertion level, NuStep, gait training,  knee ROM flexion and extension empasis; LE strengthening, manual techniques, vasocompression   Glade Pesa, PT 07/12/2024 11:00 AM Phone: [phone] Fax: [phone]   [facility] [address], [address] [place], [place] [address] Phone # [phone] Fax [phone]

## 2024-07-15 ENCOUNTER — Ambulatory Visit

## 2024-07-15 DIAGNOSIS — M6281 Muscle weakness (generalized): Secondary | ICD-10-CM

## 2024-07-15 DIAGNOSIS — M25562 Pain in left knee: Secondary | ICD-10-CM | POA: Diagnosis not present

## 2024-07-15 DIAGNOSIS — R600 Localized edema: Secondary | ICD-10-CM

## 2024-07-15 NOTE — Therapy (Signed)
 OUTPATIENT PHYSICAL THERAPY TREATMENT NOTE   Patient Name: Mary Graves MRN: [id number] DOB:Aug 29, 1945, 78 y.o., female Today's Date: 07/15/2024      END OF SESSION:  PT End of Session - 07/15/24 1022     Visit Number 11    Date for Recertification  08/05/24    Authorization Type [employer]    Progress Note Due on Visit 20    PT Start Time 1018    PT Stop Time 1059    PT Time Calculation (min) 41 min    Activity Tolerance Patient tolerated treatment well    Behavior During Therapy WFL for tasks assessed/performed          Past Medical History:  Diagnosis Date   Alcoholism (HCC)    Cancer of uvula (HCC)    2021   Heart murmur    Hypertension    Hypothyroidism    Past Surgical History:  Procedure Laterality Date   COLONOSCOPY     KNEE SURGERY Left    x 2   TONSILLECTOMY     TOTAL KNEE ARTHROPLASTY Left 06/04/2024   Procedure: ARTHROPLASTY, KNEE, TOTAL;  Surgeon: Ernie Cough, MD;  Location: [facility];  Service: Orthopedics;  Laterality: Left;   Patient Active Problem List   Diagnosis Date Noted   S/P total knee arthroplasty, left 06/04/2024   Elevated troponin 07/11/2014   Syncope 07/11/2014   PTSD (post-traumatic stress disorder) 10/31/2013   Alcoholism in family 10/31/2013   Dementia (HCC) 10/09/2013   HLD (hyperlipidemia) 02/27/2013   Osteopenia 02/27/2013   Alcohol  dependence (HCC) 06/01/2012   Major depression 06/01/2012   Hypothyroidism 02/23/2009   Essential hypertension 02/23/2009   HYPERGLYCEMIA 02/23/2009    PCP: Aisha Harvey MD  REFERRING PROVIDER: Ernie Cough MD  REFERRING DIAG: [phone] presence of left artificial knee joint  THERAPY DIAG:  Left knee pain; left knee stiffness Rationale for Evaluation and Treatment: Rehabilitation  ONSET DATE: surgery 06/04/24  SUBJECTIVE:   SUBJECTIVE STATEMENT: I rigged up some weights at home to work on extension more at home over the weekend. I put 2, 3# dumbbells in a grocery bag  and hung it over the top of my knee on the bottom of the quad muscle with a weight hanging down on each side for up to 3 mins at a time.   PERTINENT HISTORY: Hx of Lt TKR 06/04/24 HTN; osteopenia; depression  Has [employer] membership  PAIN:   Are you having pain? No, not currently   PRECAUTIONS: None     WEIGHT BEARING RESTRICTIONS: No  FALLS:  Has patient fallen in last 6 months? No  LIVING ENVIRONMENT: Lives with: lives alone but friend living with her now to help Lives in: House/apartment Stairs: Yes: External: 4 steps; on right going up Has following equipment at home: Single point cane and Environmental Consultant - 2 wheeled  OCCUPATION: retired but active doing service work Biochemist, [employer])  PLOF: Independent and Leisure: trail walking (3 miles)  PATIENT GOALS: be able to travel on an airplane by [employer]  NEXT MD VISIT: 06/20/24  OBJECTIVE:  Note: Objective measures were completed at Evaluation unless otherwise noted.  DIAGNOSTIC FINDINGS: OA left knee  PATIENT SURVEYS:  The Patient-Specific Functional Scale  Initial:  I am going to ask you to identify up to 3 important activities that you are unable to do or are having difficulty with as a result of this problem.  Today are there any activities that you are unable to do or having difficulty with  because of this?  (Patient shown scale and patient rated each activity)  Follow up: When you first came in you had difficulty performing these activities.  Today do you still have difficulty?  Patient-Specific activity scoring scheme (Point to one number):  0 1 2 3 4 5 6 7 8 9  10 Unable                                                                                                          Able to perform To perform                                                                                                    activity at the same Activity         Level as before                                                                                                                        Injury or problem  Activity  Get in/out of bed  Initial:     2                  follow up: 12/12 8 in bed, 5 out of bed  2.  Get in/out of chair  Initial:      2                 follow up:  12/12 7  3.  Walk to bathroom  Initial:      2                 follow up: 12/12   7     COGNITION: Overall cognitive status: Within functional limits for tasks assessed     SENSATION: Anterior thigh numbness  EDEMA:  Eval:  Circumferential: Rt 35cm, Lt 40.5cm with ACE bandage   LOWER EXTREMITY ROM:  Active ROM Right eval Left eval Left 06/17/24 11/21 Left 06/24/24 12/3 12/12  Hip flexion         Hip extension         Hip abduction  Hip adduction         Hip internal rotation         Hip external rotation         Knee flexion  30 A/ROM 55 P/ROM - limited by pain in quad 95  106 109 seated 114 supine   Knee extension  -12 -12 -10 -8 -7.5 supine; -15 seated   Supine and  -6 seated  Ankle dorsiflexion         Ankle plantarflexion         Ankle inversion         Ankle eversion          (Blank rows = not tested)  LOWER EXTREMITY MMT: Significant weakness of Lt hip and knee - mild quad set activation on Lt Unable to move Lt leg without external support unless in anti-gravity position Lt hip 2+/5 Lt knee 2+/5   FUNCTIONAL TESTS:  Eval: Sit to stand: heavy use of bil UE on armrests, using primarily Rt LE and kicks Lt LE out Transfers: needs external support or max A to transfer Lt LE into bed/onto table  06/19/2024: Timed up and Go (TUG):  21.96 sec with SPC 3 minute walk test: 358 ft with SPC  12/12;  7.90 TUG no device, light use of Ues              3 MWT 654 no device  GAIT: Distance walked:  Assistive device utilized: Environmental Consultant - 2 wheeled Level of assistance: Modified independence Comments: ambulates with Lt LE straight                                                                                                                                  TREATMENT DATE:  07/15/24: Therapeutic Exercises and Neuro Muscular Re Ed NuStep level 6 , x 8 min 6 step ups 2 x 10 with 3 on step each leg Total knee ext against purple ball on wall 2 x 10, 5 sec holds Rockerboard x 3 min PF/DF Cable resisted backwards walking 10# with belt 8x emphasizing knee extension, then forwards walking with CGA x 5 reps, this was very challenging for pts balance and seated rest required after due to feeling slight SOB Seated EOB with heel on 4 step for Lt HS stretch with demo for erect posture x 3 reps, 20-30 sec holds, encouraged pt to cont working on knee ext during her day just finding times to cont working these stretches in, along with weighted hang and also educated her on trying prone lying with gravity hang with foot off bed and demonstrated this for her to try as well.  Lt LAQ 5# x 10, 5 sec holds On mini tramp: Alt march focusing on stability and full Lt knee ext during FWB x 2 mins  07/12/2024: Bike 4 min full revolutions while discussing status PSFS TUG 3 MWT Seated with 5# across thigh, heel on stool for  knee extension 2 min discussed options for home to do this LAQ 5# 10x(challenging) HS stretch on 2nd step 30 sec holds  Rockerboard 3 minutes PF/DF 6 inch step ups 20x light UE support  Cable resisted walk 10# with handles (use belt next time) 8x emphasizing knee extension Leg press seat 7: 70# bil 15x; left only 35# 15x Knee ROM as above    07/10/2024: Bike 4 min full revolutions while discussing status Slant board calf stretch 30 sec holds 5x right/left (Added to HEP- see below) HS stretch on 2nd step 30 sec holds (Added to HEP- see below) Knee flexion on 2nd step 20x (Added to HEP- see below) Rockerboard 3 minutes PF/DF Up and down 4 steps reciprocally with light UE support (Added to HEP- see below) 6 inch step ups 10x light UE support (Added to HEP- see below) Leg press seat 7: 65# bil 15x; left  only 30# 15x Seated LAQ  3#  2x10 left LE Seated knee extension stretch with heel on stool (Added to HEP- see below) Vasocompression:  Game Ready to left knee x10 min with 3 snowflakes with medium compression  07/03/2024: Nustep level 3 x10 min with PT present to discuss status  Manual: grade 2 extension mobs 10 sec 10 rounds, patellar mobs particularly inferior direction to help with knee extension Manual: contract relax HS to encourage HS lengthening; knee flexion passively 10x; knee flexion overpressure 10x Knee ROM as above Seated LAQ  2#  2x10 left LE Leg press seat 8 ( move seat closer next time) 65# bil 15x; left only 30# 15x Resisted backwards cable walk 10# with belt with cues for bigger steps to encourage increase knee extension and quad muscle activation 7 reps Vasocompression:  Game Ready to left knee x10 min with 3 snowflakes with medium compression  06/26/2024: Nustep level 4 x4 min with PT present to discuss status Seated LAQ 2x10 left LE Seated left quad set with foot resting on 6 step 2x10 Slight positive Homan's on the left calf.  Notified Rosina Calin, PA-C and she advised it was okay to continue PT Seated heel slide with foot on slider x20 Recumbent bike level 1 x4 min with PT present to monitor Standing at barre:  high marching, hip abduction, (seated recovery) hip extension, heel raise, hamstring curl.  X10 each bilat Sit to/from stand with staggered stance (RLE slightly ahead) 2x10   06/24/2024: Nustep level 3 x5 min with PT present to discuss status Recumbent bike level 1 x3 min with PT present to monitor (able to complete full revolution) Seated LAQ 2x10 left LE Seated left quad set with foot resting on 6 step 2x10 Seated heel slide with foot on slider x20 Standing TKE with green tband 2x10 left LE Seated hamstring curl with green tband 2x10 bilat Sit to/from stand with staggered stance (RLE slightly ahead) 2x10 Standing at barre:  high marching, hip  abduction, hip extension, heel raise, hamstring curl.  X10 each bilat Vasocompression:  Game Ready to left knee x10 min with 3 snowflakes with medium compression     PATIENT EDUCATION:  Education details: Educated patient on anatomy and physiology of current symptoms, prognosis, plan of care as well as initial self care strategies to promote recovery Person educated: Patient Education method: Explanation Education comprehension: verbalized understanding  HOME EXERCISE PROGRAM: Access Code: [email or web address] URL: [email or web address] Date: 07/10/2024 Prepared by: Glade Pesa  Exercises - Supine Quad Set  - 1 x daily - 7 x weekly - 2 sets -  10 reps - 3 sec hold - Supine Hip Abduction  - 1 x daily - 7 x weekly - 2 sets - 10 reps - Supine Knee Extension Stretch on Towel Roll  - 1 x daily - 7 x weekly - 3 sets - 10 reps - Seated Heel Slide  - 1 x daily - 7 x weekly - 3 sets - 10 reps - Supine Heel Slide with Strap  - 1 x daily - 7 x weekly - 2 sets - 10 reps - Small Range Straight Leg Raise  - 1 x daily - 7 x weekly - 2 sets - 10 reps - Sit to Stand with Armchair  - 1 x daily - 7 x weekly - 2 sets - 5 reps - Standing March with Counter Support  - 1 x daily - 7 x weekly - 1-2 sets - 10 reps - Standing Hip Abduction with Counter Support  - 1 x daily - 7 x weekly - 1-2 sets - 10 reps - Heel Raises with Counter Support  - 1 x daily - 7 x weekly - 1-2 sets - 10 reps - Standing Hip Extension with Counter Support  - 1 x daily - 7 x weekly - 1-2 sets - 10 reps - Standing Knee Flexion with Counter Support  - 1 x daily - 7 x weekly - 1-2 sets - 10 reps - Standing Knee Flexion Stretch on Step  - 1 x daily - 7 x weekly - 1 sets - 10 reps - Standing Hamstring Stretch with Step  - 1 x daily - 7 x weekly - 1 sets - 10 reps - Gastroc Stretch with Foot at Wall  - 1 x daily - 7 x weekly - 1 sets - 10 reps - 30 hold - Forward Step Up (Mirrored)  - 1 x daily - 7 x weekly - 1 sets - 10 reps - Seated  Passive Knee Extension  - 1 x daily - 7 x weekly - 1 sets - 3 reps - 60 hold ASSESSMENT:  CLINICAL IMPRESSION: Pt is very motivated to get her full ROM and strength back and has been working hard on incorporating knee ext exercises into her day. Continued with focus on this during session along with strength and stability. Pt reports feeling challenged by these activities and, though it's taking longer than she expected, is pleased with her recovery thus far.    OBJECTIVE IMPAIRMENTS: decreased activity tolerance, decreased balance, decreased mobility, difficulty walking, decreased ROM, decreased strength, increased edema, impaired perceived functional ability, and pain.   ACTIVITY LIMITATIONS: bending, standing, squatting, stairs, transfers, bathing, toileting, dressing, hygiene/grooming, and locomotion level  PARTICIPATION LIMITATIONS: meal prep, cleaning, laundry, driving, shopping, and community activity  PERSONAL FACTORS: 1-2 comorbidities: HTN are also affecting patient's functional outcome.   REHAB POTENTIAL: Good  CLINICAL DECISION MAKING: Stable/uncomplicated  EVALUATION COMPLEXITY: Low   GOALS: Goals reviewed with patient? Yes  SHORT TERM GOALS: Target date: 07/05/2024   The patient will demonstrate knowledge of basic self care strategies and exercises to promote healing  Baseline: Goal status: met 11/21  2.  Pt will be able to perform sit to stand with equal WB through bil LE with UE assist Baseline:  Goal status: met 12/3  3.  Pt will improve Lt knee A/ROM measured in supine to -4 to 90 deg Baseline:  Goal status: Ongoing (see above)  4.  Pt will demo symmetrical gait with walker  Baseline:  Goal status: met 11/21  LONG TERM GOALS: Target date: 08/02/2024    The patient will be independent in a safe self progression of a home exercise program to promote further recovery of function  Baseline:  Goal status: Ongoing  2.  The patient will report a 60%  improvement in pain levels with functional activities which are currently difficult including transfers, stairs, and bed mobility Baseline:  Goal status: INITIAL  3.  The patient will have improved LE strength of at least 4+/5 needed to ascend and descend steps reciprocally  Baseline:  Goal status: Ongoing  4.  Pt will be able to perform household ambulation and short distance community ambulation with LRAD. Baseline:  Goal status: Ongoing      PLAN:  PT FREQUENCY: 3x/week  PT DURATION: 8 weeks  PLANNED INTERVENTIONS: 97164- PT Re-evaluation, 97750- Physical Performance Testing, 97110-Therapeutic exercises, 97530- Therapeutic activity, 97112- Neuromuscular re-education, 97535- Self Care, [id number]- Manual therapy, [phone]- Aquatic Therapy, [id number]- Electrical stimulation (unattended), [phone]- Electrical stimulation (manual), 97016- Vasopneumatic device, Patient/Family education, Balance training, Stair training, Taping, Joint mobilization, Cryotherapy, and Moist heat  PLAN FOR NEXT SESSION:   monitor exertion level, NuStep, gait training, knee ROM flexion and extension empasis; LE strengthening, manual techniques, vasocompression    Berwyn Knights, PTA 07/15/2024 11:13 AM Phone: [phone] Fax: [phone]   [facility] [address] [place], [place] [address] Phone # [phone] Fax [phone]

## 2024-07-17 ENCOUNTER — Ambulatory Visit: Admitting: Physical Therapy

## 2024-07-17 ENCOUNTER — Encounter: Payer: Self-pay | Admitting: Physical Therapy

## 2024-07-17 DIAGNOSIS — M25562 Pain in left knee: Secondary | ICD-10-CM | POA: Diagnosis not present

## 2024-07-17 DIAGNOSIS — R600 Localized edema: Secondary | ICD-10-CM

## 2024-07-17 DIAGNOSIS — R2689 Other abnormalities of gait and mobility: Secondary | ICD-10-CM

## 2024-07-17 DIAGNOSIS — M6281 Muscle weakness (generalized): Secondary | ICD-10-CM

## 2024-07-17 NOTE — Therapy (Signed)
 OUTPATIENT PHYSICAL THERAPY TREATMENT NOTE   Patient Name: Mary Graves MRN: [id number] DOB:July 08, 1946, 78 y.o., female Today's Date: 07/17/2024      END OF SESSION:  PT End of Session - 07/17/24 1149     Visit Number 12    Date for Recertification  08/05/24    Authorization Type [employer]    Progress Note Due on Visit 20    PT Start Time 1147    PT Stop Time 1230    PT Time Calculation (min) 43 min    Activity Tolerance Patient tolerated treatment well          Past Medical History:  Diagnosis Date   Alcoholism (HCC)    Cancer of uvula (HCC)    2021   Heart murmur    Hypertension    Hypothyroidism    Past Surgical History:  Procedure Laterality Date   COLONOSCOPY     KNEE SURGERY Left    x 2   TONSILLECTOMY     TOTAL KNEE ARTHROPLASTY Left 06/04/2024   Procedure: ARTHROPLASTY, KNEE, TOTAL;  Surgeon: Ernie Cough, MD;  Location: [facility];  Service: Orthopedics;  Laterality: Left;   Patient Active Problem List   Diagnosis Date Noted   S/P total knee arthroplasty, left 06/04/2024   Elevated troponin 07/11/2014   Syncope 07/11/2014   PTSD (post-traumatic stress disorder) 10/31/2013   Alcoholism in family 10/31/2013   Dementia (HCC) 10/09/2013   HLD (hyperlipidemia) 02/27/2013   Osteopenia 02/27/2013   Alcohol  dependence (HCC) 06/01/2012   Major depression 06/01/2012   Hypothyroidism 02/23/2009   Essential hypertension 02/23/2009   HYPERGLYCEMIA 02/23/2009    PCP: Aisha Harvey MD  REFERRING PROVIDER: Ernie Cough MD  REFERRING DIAG: [phone] presence of left artificial knee joint  THERAPY DIAG:  Left knee pain; left knee stiffness Rationale for Evaluation and Treatment: Rehabilitation  ONSET DATE: surgery 06/04/24  SUBJECTIVE:   SUBJECTIVE STATEMENT: I ordered a 5# weight from [employer].  When I first wake up in the morning I'm almost crippled and can't get that leg straight. Gets better as the day goes on. Thinking about getting into  the therapy pool tomorrow.     PERTINENT HISTORY: Hx of Lt TKR 06/04/24 HTN; osteopenia; depression  Has [employer] membership  PAIN:   Are you having pain? No, not currently   PRECAUTIONS: None     WEIGHT BEARING RESTRICTIONS: No  FALLS:  Has patient fallen in last 6 months? No  LIVING ENVIRONMENT: Lives with: lives alone but friend living with her now to help Lives in: House/apartment Stairs: Yes: External: 4 steps; on right going up Has following equipment at home: Single point cane and Environmental Consultant - 2 wheeled  OCCUPATION: retired but active doing service work Biochemist, [employer])  PLOF: Independent and Leisure: trail walking (3 miles)  PATIENT GOALS: be able to travel on an airplane by [employer]  NEXT MD VISIT: 06/20/24  OBJECTIVE:  Note: Objective measures were completed at Evaluation unless otherwise noted.  DIAGNOSTIC FINDINGS: OA left knee  PATIENT SURVEYS:  The Patient-Specific Functional Scale  Initial:  I am going to ask you to identify up to 3 important activities that you are unable to do or are having difficulty with as a result of this problem.  Today are there any activities that you are unable to do or having difficulty with because of this?  (Patient shown scale and patient rated each activity)  Follow up: When you first came in you had difficulty performing these activities.  Today do you still have difficulty?  Patient-Specific activity scoring scheme (Point to one number):  0 1 2 3 4 5 6 7 8 9  10 Unable                                                                                                          Able to perform To perform                                                                                                    activity at the same Activity         Level as before                                                                                                                       Injury or problem  Activity  Get in/out of  bed  Initial:     2                  follow up: 12/12 8 in bed, 5 out of bed  2.  Get in/out of chair  Initial:      2                 follow up:  12/12 7  3.  Walk to bathroom  Initial:      2                 follow up: 12/12   7     COGNITION: Overall cognitive status: Within functional limits for tasks assessed     SENSATION: Anterior thigh numbness  EDEMA:  Eval:  Circumferential: Rt 35cm, Lt 40.5cm with ACE bandage   LOWER EXTREMITY ROM:  Active ROM Right eval Left eval Left 06/17/24 11/21 Left 06/24/24 12/3 12/12  Hip flexion         Hip extension         Hip abduction         Hip adduction         Hip internal rotation         Hip external rotation  Knee flexion  30 A/ROM 55 P/ROM - limited by pain in quad 95  106 109 seated 114 supine   Knee extension  -12 -12 -10 -8 -7.5 supine; -15 seated   Supine and  -6 seated  Ankle dorsiflexion         Ankle plantarflexion         Ankle inversion         Ankle eversion          (Blank rows = not tested)  LOWER EXTREMITY MMT: Significant weakness of Lt hip and knee - mild quad set activation on Lt Unable to move Lt leg without external support unless in anti-gravity position Lt hip 2+/5 Lt knee 2+/5   FUNCTIONAL TESTS:  Eval: Sit to stand: heavy use of bil UE on armrests, using primarily Rt LE and kicks Lt LE out Transfers: needs external support or max A to transfer Lt LE into bed/onto table  06/19/2024: Timed up and Go (TUG):  21.96 sec with SPC 3 minute walk test: 358 ft with SPC  12/12;  7.90 TUG no device, light use of Ues              3 MWT 654 no device  GAIT: Distance walked:  Assistive device utilized: Environmental Consultant - 2 wheeled Level of assistance: Modified independence Comments: ambulates with Lt LE straight                                                                                                                                 TREATMENT DATE:  07/17/24: Bike 4 min full  revolution Manual:  belt assisted seated extension mobs grade 3  Seated knee flexion with slider 20x Seated with heel on the stool: endrange lift off 2x10 Green band HS curls 10x HS stretch on 2nd step with manual overpressure 10x Squats holding to railing 10x 2 inch step downs for eccentric quad control 10x WB on left: right hip 3 ways 10x  (challenging) 6 step ups  x 10  Prone hang towel roll above knee no weight 2 min Sit to stand from 28 inch table height in staggered stance (left to the back) 10x 2  90% weight on left  07/15/24: Therapeutic Exercises and Neuro Muscular Re Ed NuStep level 6 , x 8 min 6 step ups 2 x 10 with 3 on step each leg Total knee ext against purple ball on wall 2 x 10, 5 sec holds Rockerboard x 3 min PF/DF Cable resisted backwards walking 10# with belt 8x emphasizing knee extension, then forwards walking with CGA x 5 reps, this was very challenging for pts balance and seated rest required after due to feeling slight SOB Seated EOB with heel on 4 step for Lt HS stretch with demo for erect posture x 3 reps, 20-30 sec holds, encouraged pt to cont working on knee ext during her day just finding times to cont working these stretches in,  along with weighted hang and also educated her on trying prone lying with gravity hang with foot off bed and demonstrated this for her to try as well.  Lt LAQ 5# x 10, 5 sec holds On mini tramp: Alt march focusing on stability and full Lt knee ext during FWB x 2 mins  07/12/2024: Bike 4 min full revolutions while discussing status PSFS TUG 3 MWT Seated with 5# across thigh, heel on stool for knee extension 2 min discussed options for home to do this LAQ 5# 10x(challenging) HS stretch on 2nd step 30 sec holds  Rockerboard 3 minutes PF/DF 6 inch step ups 20x light UE support  Cable resisted walk 10# with handles (use belt next time) 8x emphasizing knee extension Leg press seat 7: 70# bil 15x; left only 35# 15x Knee ROM as  above    07/10/2024: Bike 4 min full revolutions while discussing status Slant board calf stretch 30 sec holds 5x right/left (Added to HEP- see below) HS stretch on 2nd step 30 sec holds (Added to HEP- see below) Knee flexion on 2nd step 20x (Added to HEP- see below) Rockerboard 3 minutes PF/DF Up and down 4 steps reciprocally with light UE support (Added to HEP- see below) 6 inch step ups 10x light UE support (Added to HEP- see below) Leg press seat 7: 65# bil 15x; left only 30# 15x Seated LAQ  3#  2x10 left LE Seated knee extension stretch with heel on stool (Added to HEP- see below) Vasocompression:  Game Ready to left knee x10 min with 3 snowflakes with medium compression  07/03/2024: Nustep level 3 x10 min with PT present to discuss status  Manual: grade 2 extension mobs 10 sec 10 rounds, patellar mobs particularly inferior direction to help with knee extension Manual: contract relax HS to encourage HS lengthening; knee flexion passively 10x; knee flexion overpressure 10x Knee ROM as above Seated LAQ  2#  2x10 left LE Leg press seat 8 ( move seat closer next time) 65# bil 15x; left only 30# 15x Resisted backwards cable walk 10# with belt with cues for bigger steps to encourage increase knee extension and quad muscle activation 7 reps Vasocompression:  Game Ready to left knee x10 min with 3 snowflakes with medium compression  06/26/2024: Nustep level 4 x4 min with PT present to discuss status Seated LAQ 2x10 left LE Seated left quad set with foot resting on 6 step 2x10 Slight positive Homan's on the left calf.  Notified Rosina Calin, PA-C and she advised it was okay to continue PT Seated heel slide with foot on slider x20 Recumbent bike level 1 x4 min with PT present to monitor Standing at barre:  high marching, hip abduction, (seated recovery) hip extension, heel raise, hamstring curl.  X10 each bilat Sit to/from stand with staggered stance (RLE slightly ahead)  2x10   06/24/2024: Nustep level 3 x5 min with PT present to discuss status Recumbent bike level 1 x3 min with PT present to monitor (able to complete full revolution) Seated LAQ 2x10 left LE Seated left quad set with foot resting on 6 step 2x10 Seated heel slide with foot on slider x20 Standing TKE with green tband 2x10 left LE Seated hamstring curl with green tband 2x10 bilat Sit to/from stand with staggered stance (RLE slightly ahead) 2x10 Standing at barre:  high marching, hip abduction, hip extension, heel raise, hamstring curl.  X10 each bilat Vasocompression:  Game Ready to left knee x10 min with 3 snowflakes with  medium compression     PATIENT EDUCATION:  Education details: Educated patient on anatomy and physiology of current symptoms, prognosis, plan of care as well as initial self care strategies to promote recovery Person educated: Patient Education method: Explanation Education comprehension: verbalized understanding  HOME EXERCISE PROGRAM: Access Code: [email or web address] URL: [email or web address] Date: 07/10/2024 Prepared by: Glade Pesa  Exercises - Supine Quad Set  - 1 x daily - 7 x weekly - 2 sets - 10 reps - 3 sec hold - Supine Hip Abduction  - 1 x daily - 7 x weekly - 2 sets - 10 reps - Supine Knee Extension Stretch on Towel Roll  - 1 x daily - 7 x weekly - 3 sets - 10 reps - Seated Heel Slide  - 1 x daily - 7 x weekly - 3 sets - 10 reps - Supine Heel Slide with Strap  - 1 x daily - 7 x weekly - 2 sets - 10 reps - Small Range Straight Leg Raise  - 1 x daily - 7 x weekly - 2 sets - 10 reps - Sit to Stand with Armchair  - 1 x daily - 7 x weekly - 2 sets - 5 reps - Standing March with Counter Support  - 1 x daily - 7 x weekly - 1-2 sets - 10 reps - Standing Hip Abduction with Counter Support  - 1 x daily - 7 x weekly - 1-2 sets - 10 reps - Heel Raises with Counter Support  - 1 x daily - 7 x weekly - 1-2 sets - 10 reps - Standing Hip Extension with  Counter Support  - 1 x daily - 7 x weekly - 1-2 sets - 10 reps - Standing Knee Flexion with Counter Support  - 1 x daily - 7 x weekly - 1-2 sets - 10 reps - Standing Knee Flexion Stretch on Step  - 1 x daily - 7 x weekly - 1 sets - 10 reps - Standing Hamstring Stretch with Step  - 1 x daily - 7 x weekly - 1 sets - 10 reps - Gastroc Stretch with Foot at Wall  - 1 x daily - 7 x weekly - 1 sets - 10 reps - 30 hold - Forward Step Up (Mirrored)  - 1 x daily - 7 x weekly - 1 sets - 10 reps - Seated Passive Knee Extension  - 1 x daily - 7 x weekly - 1 sets - 3 reps - 60 hold ASSESSMENT:  CLINICAL IMPRESSION: Loreta is progressing well including improved stamina for exercise and general activity and function.  Treatment focus on knee extension ROM and terminal knee extension quad control.  Therapist providing verbal cues to optimize technique with  exercises in order to achieve the greatest benefit.  She is highly motivated and compliant with current HEP.      OBJECTIVE IMPAIRMENTS: decreased activity tolerance, decreased balance, decreased mobility, difficulty walking, decreased ROM, decreased strength, increased edema, impaired perceived functional ability, and pain.   ACTIVITY LIMITATIONS: bending, standing, squatting, stairs, transfers, bathing, toileting, dressing, hygiene/grooming, and locomotion level  PARTICIPATION LIMITATIONS: meal prep, cleaning, laundry, driving, shopping, and community activity  PERSONAL FACTORS: 1-2 comorbidities: HTN are also affecting patient's functional outcome.   REHAB POTENTIAL: Good  CLINICAL DECISION MAKING: Stable/uncomplicated  EVALUATION COMPLEXITY: Low   GOALS: Goals reviewed with patient? Yes  SHORT TERM GOALS: Target date: 07/05/2024   The patient will demonstrate knowledge of basic self care strategies and exercises to promote  healing  Baseline: Goal status: met 11/21  2.  Pt will be able to perform sit to stand with equal WB through bil LE  with UE assist Baseline:  Goal status: met 12/3  3.  Pt will improve Lt knee A/ROM measured in supine to -4 to 90 deg Baseline:  Goal status: Ongoing (see above)  4.  Pt will demo symmetrical gait with walker  Baseline:  Goal status: met 11/21    LONG TERM GOALS: Target date: 08/02/2024    The patient will be independent in a safe self progression of a home exercise program to promote further recovery of function  Baseline:  Goal status: Ongoing  2.  The patient will report a 60% improvement in pain levels with functional activities which are currently difficult including transfers, stairs, and bed mobility Baseline:  Goal status: INITIAL  3.  The patient will have improved LE strength of at least 4+/5 needed to ascend and descend steps reciprocally  Baseline:  Goal status: Ongoing  4.  Pt will be able to perform household ambulation and short distance community ambulation with LRAD. Baseline:  Goal status: Ongoing      PLAN:  PT FREQUENCY: 3x/week  PT DURATION: 8 weeks  PLANNED INTERVENTIONS: 97164- PT Re-evaluation, 97750- Physical Performance Testing, 97110-Therapeutic exercises, 97530- Therapeutic activity, 97112- Neuromuscular re-education, 97535- Self Care, 02859- Manual therapy, [phone]- Aquatic Therapy, [id number]- Electrical stimulation (unattended), [phone]- Electrical stimulation (manual), 97016- Vasopneumatic device, Patient/Family education, Balance training, Stair training, Taping, Joint mobilization, Cryotherapy, and Moist heat  PLAN FOR NEXT SESSION:   check ROM on Friday; monitor exertion level, NuStep, gait training, knee ROM flexion and extension empasis; LE strengthening, manual techniques, vasocompression   Glade Pesa, PT 07/17/2024 5:02 PM Phone: [phone] Fax: [phone]   [facility] [address], [address] [place], [place] [address] Phone # [phone] Fax [phone]

## 2024-07-19 ENCOUNTER — Ambulatory Visit: Admitting: Rehabilitative and Restorative Service Providers"

## 2024-07-19 ENCOUNTER — Encounter: Payer: Self-pay | Admitting: Rehabilitative and Restorative Service Providers"

## 2024-07-19 DIAGNOSIS — M25562 Pain in left knee: Secondary | ICD-10-CM | POA: Diagnosis not present

## 2024-07-19 DIAGNOSIS — R2689 Other abnormalities of gait and mobility: Secondary | ICD-10-CM

## 2024-07-19 DIAGNOSIS — R600 Localized edema: Secondary | ICD-10-CM

## 2024-07-19 DIAGNOSIS — M6281 Muscle weakness (generalized): Secondary | ICD-10-CM

## 2024-07-19 NOTE — Therapy (Signed)
 " OUTPATIENT PHYSICAL THERAPY TREATMENT NOTE   Patient Name: Mary Graves MRN: [id number] DOB:07-05-1946, 78 y.o., female Today's Date: 07/19/2024      END OF SESSION:  PT End of Session - 07/19/24 1101     Visit Number 13    Date for Recertification  08/05/24    Authorization Type [employer]    Progress Note Due on Visit 20    PT Start Time 1101    PT Stop Time 1140    PT Time Calculation (min) 39 min    Activity Tolerance Patient tolerated treatment well    Behavior During Therapy WFL for tasks assessed/performed          Past Medical History:  Diagnosis Date   Alcoholism (HCC)    Cancer of uvula (HCC)    2021   Heart murmur    Hypertension    Hypothyroidism    Past Surgical History:  Procedure Laterality Date   COLONOSCOPY     KNEE SURGERY Left    x 2   TONSILLECTOMY     TOTAL KNEE ARTHROPLASTY Left 06/04/2024   Procedure: ARTHROPLASTY, KNEE, TOTAL;  Surgeon: Ernie Cough, MD;  Location: [facility];  Service: Orthopedics;  Laterality: Left;   Patient Active Problem List   Diagnosis Date Noted   S/P total knee arthroplasty, left 06/04/2024   Elevated troponin 07/11/2014   Syncope 07/11/2014   PTSD (post-traumatic stress disorder) 10/31/2013   Alcoholism in family 10/31/2013   Dementia (HCC) 10/09/2013   HLD (hyperlipidemia) 02/27/2013   Osteopenia 02/27/2013   Alcohol  dependence (HCC) 06/01/2012   Major depression 06/01/2012   Hypothyroidism 02/23/2009   Essential hypertension 02/23/2009   HYPERGLYCEMIA 02/23/2009    PCP: Aisha Harvey MD  REFERRING PROVIDER: Ernie Cough MD  REFERRING DIAG: [phone] presence of left artificial knee joint  THERAPY DIAG:  Left knee pain; left knee stiffness Rationale for Evaluation and Treatment: Rehabilitation  ONSET DATE: surgery 06/04/24  SUBJECTIVE:   SUBJECTIVE STATEMENT: Patient reports that she has not gone to the pool yet.  States that she is going out to lunch with a friend.   PERTINENT  HISTORY: Hx of Lt TKR 06/04/24 HTN; osteopenia; depression  Has [employer] membership  PAIN:   Are you having pain? Yes NPRS scale: 5/10 Pain location: left knee PAIN TYPE: aching Aggravating factors: certain movements/activity Relieving factors: cold pack   PRECAUTIONS: None     WEIGHT BEARING RESTRICTIONS: No  FALLS:  Has patient fallen in last 6 months? No  LIVING ENVIRONMENT: Lives with: lives alone but friend living with her now to help Lives in: House/apartment Stairs: Yes: External: 4 steps; on right going up Has following equipment at home: Single point cane and Environmental Consultant - 2 wheeled  OCCUPATION: retired but active doing service work Biochemist, [employer])  PLOF: Independent and Leisure: trail walking (3 miles)  PATIENT GOALS: be able to travel on an airplane by [employer]  NEXT MD VISIT: 06/20/24  OBJECTIVE:  Note: Objective measures were completed at Evaluation unless otherwise noted.  DIAGNOSTIC FINDINGS: OA left knee  PATIENT SURVEYS:  The Patient-Specific Functional Scale  Initial:  I am going to ask you to identify up to 3 important activities that you are unable to do or are having difficulty with as a result of this problem.  Today are there any activities that you are unable to do or having difficulty with because of this?  (Patient shown scale and patient rated each activity)  Follow up: When you first  came in you had difficulty performing these activities.  Today do you still have difficulty?  Patient-Specific activity scoring scheme (Point to one number):  0 1 2 3 4 5 6 7 8 9  10 Unable                                                                                                          Able to perform To perform                                                                                                    activity at the same Activity         Level as before                                                                                                                        Injury or problem  Activity  Get in/out of bed  Initial:     2                  follow up: 12/12 8 in bed, 5 out of bed  2.  Get in/out of chair  Initial:      2                 follow up:  12/12 7  3.  Walk to bathroom  Initial:      2                 follow up: 12/12   7     COGNITION: Overall cognitive status: Within functional limits for tasks assessed     SENSATION: Anterior thigh numbness  EDEMA:  Eval:  Circumferential: Rt 35cm, Lt 40.5cm with ACE bandage   LOWER EXTREMITY ROM:  Active ROM Right eval Left eval Left 06/17/24 11/21 Left 06/24/24 12/3 12/12  Hip flexion         Hip extension         Hip abduction         Hip adduction         Hip internal rotation  Hip external rotation         Knee flexion  30 A/ROM 55 P/ROM - limited by pain in quad 95  106 109 seated 114 supine   Knee extension  -12 -12 -10 -8 -7.5 supine; -15 seated   Supine and  -6 seated  Ankle dorsiflexion         Ankle plantarflexion         Ankle inversion         Ankle eversion          (Blank rows = not tested)  LOWER EXTREMITY MMT: Significant weakness of Lt hip and knee - mild quad set activation on Lt Unable to move Lt leg without external support unless in anti-gravity position Lt hip 2+/5 Lt knee 2+/5   FUNCTIONAL TESTS:  Eval: Sit to stand: heavy use of bil UE on armrests, using primarily Rt LE and kicks Lt LE out Transfers: needs external support or max A to transfer Lt LE into bed/onto table  06/19/2024: Timed up and Go (TUG):  21.96 sec with SPC 3 minute walk test: 358 ft with SPC  12/12;   Timed up and God (TUG):  7.90 no device, light use of Ues 3 minute walk test:  654 ft with no device  GAIT: Distance walked:  Assistive device utilized: Environmental Consultant - 2 wheeled Level of assistance: Modified independence Comments: ambulates with Lt LE straight                                                                                                                                  TREATMENT DATE:   07/19/2024: Recumbent bike level 2 x7 min with PT present to discuss status A/ROM in sitting 10 to 110 degrees Seated left quad sets with foot on stool 2x10 (able to achieve 8 degrees missing from full extension) Seated hamstring curl with green tband 2x10 bilat Standing left knee TKE with green tband 2x10 HS stretch on 2nd step with manual overpressure 10x5 sec hold FWD step ups on 6 step x10 bilat 2 inch step downs for eccentric quad control 10x Sit to stand from 24 RITFIT box with staggered stance with left leg back for increased weight bearing 2x10   07/17/24: Bike 4 min full revolution Manual:  belt assisted seated extension mobs grade 3  Seated knee flexion with slider 20x Seated with heel on the stool: endrange lift off 2x10 Green band HS curls 10x HS stretch on 2nd step with manual overpressure 10x Squats holding to railing 10x 2 inch step downs for eccentric quad control 10x WB on left: right hip 3 ways 10x  (challenging) 6 step ups  x 10  Prone hang towel roll above knee no weight 2 min Sit to stand from 28 inch table height in staggered stance (left to the back) 10x 2  90% weight on left  07/15/24: Therapeutic Exercises and Neuro Muscular Re Ed NuStep level 6 ,  x 8 min 6 step ups 2 x 10 with 3 on step each leg Total knee ext against purple ball on wall 2 x 10, 5 sec holds Rockerboard x 3 min PF/DF Cable resisted backwards walking 10# with belt 8x emphasizing knee extension, then forwards walking with CGA x 5 reps, this was very challenging for pts balance and seated rest required after due to feeling slight SOB Seated EOB with heel on 4 step for Lt HS stretch with demo for erect posture x 3 reps, 20-30 sec holds, encouraged pt to cont working on knee ext during her day just finding times to cont working these stretches in, along with weighted hang and also educated her on trying prone lying with  gravity hang with foot off bed and demonstrated this for her to try as well.  Lt LAQ 5# x 10, 5 sec holds On mini tramp: Alt march focusing on stability and full Lt knee ext during FWB x 2 mins    PATIENT EDUCATION:  Education details: Educated patient on anatomy and physiology of current symptoms, prognosis, plan of care as well as initial self care strategies to promote recovery Person educated: Patient Education method: Explanation Education comprehension: verbalized understanding  HOME EXERCISE PROGRAM:  Access Code: [email or web address] URL: [email or web address] Date: 07/10/2024 Prepared by: Glade Pesa  Exercises - Supine Quad Set  - 1 x daily - 7 x weekly - 2 sets - 10 reps - 3 sec hold - Supine Hip Abduction  - 1 x daily - 7 x weekly - 2 sets - 10 reps - Supine Knee Extension Stretch on Towel Roll  - 1 x daily - 7 x weekly - 3 sets - 10 reps - Seated Heel Slide  - 1 x daily - 7 x weekly - 3 sets - 10 reps - Supine Heel Slide with Strap  - 1 x daily - 7 x weekly - 2 sets - 10 reps - Small Range Straight Leg Raise  - 1 x daily - 7 x weekly - 2 sets - 10 reps - Sit to Stand with Armchair  - 1 x daily - 7 x weekly - 2 sets - 5 reps - Standing March with Counter Support  - 1 x daily - 7 x weekly - 1-2 sets - 10 reps - Standing Hip Abduction with Counter Support  - 1 x daily - 7 x weekly - 1-2 sets - 10 reps - Heel Raises with Counter Support  - 1 x daily - 7 x weekly - 1-2 sets - 10 reps - Standing Hip Extension with Counter Support  - 1 x daily - 7 x weekly - 1-2 sets - 10 reps - Standing Knee Flexion with Counter Support  - 1 x daily - 7 x weekly - 1-2 sets - 10 reps - Standing Knee Flexion Stretch on Step  - 1 x daily - 7 x weekly - 1 sets - 10 reps - Standing Hamstring Stretch with Step  - 1 x daily - 7 x weekly - 1 sets - 10 reps - Gastroc Stretch with Foot at Wall  - 1 x daily - 7 x weekly - 1 sets - 10 reps - 30 hold - Forward Step Up (Mirrored)  - 1 x daily - 7 x  weekly - 1 sets - 10 reps - Seated Passive Knee Extension  - 1 x daily - 7 x weekly - 1 sets - 3 reps - 60 hold  ASSESSMENT:  CLINICAL IMPRESSION:  Ms Speedy presents to skilled PT reporting that she is working on trying to straighten her knee, but is feeling some tightness this morning.  Patient is progressing with increased A/ROM in seated position.  With quad sets, patient required some cuing for improved quad contraction and not performing a glute contraction.  Patient was able to perform sit to stand from a lower surface today.  Patient continues to require skilled PT to progress towards goal related activities.   OBJECTIVE IMPAIRMENTS: decreased activity tolerance, decreased balance, decreased mobility, difficulty walking, decreased ROM, decreased strength, increased edema, impaired perceived functional ability, and pain.   ACTIVITY LIMITATIONS: bending, standing, squatting, stairs, transfers, bathing, toileting, dressing, hygiene/grooming, and locomotion level  PARTICIPATION LIMITATIONS: meal prep, cleaning, laundry, driving, shopping, and community activity  PERSONAL FACTORS: 1-2 comorbidities: HTN are also affecting patient's functional outcome.   REHAB POTENTIAL: Good  CLINICAL DECISION MAKING: Stable/uncomplicated  EVALUATION COMPLEXITY: Low   GOALS: Goals reviewed with patient? Yes  SHORT TERM GOALS: Target date: 07/05/2024   The patient will demonstrate knowledge of basic self care strategies and exercises to promote healing  Baseline: Goal status: met 11/21  2.  Pt will be able to perform sit to stand with equal WB through bil LE with UE assist Baseline:  Goal status: met 12/3  3.  Pt will improve Lt knee A/ROM measured in supine to -4 to 90 deg Baseline:  Goal status: Ongoing (10 to 110 on 07/19/24)  4.  Pt will demo symmetrical gait with walker  Baseline:  Goal status: met 11/21    LONG TERM GOALS: Target date: 08/02/2024    The patient will be  independent in a safe self progression of a home exercise program to promote further recovery of function  Baseline:  Goal status: Ongoing  2.  The patient will report a 60% improvement in pain levels with functional activities which are currently difficult including transfers, stairs, and bed mobility Baseline:  Goal status: Met on 07/19/24 (reports 80% improvement)  3.  The patient will have improved LE strength of at least 4+/5 needed to ascend and descend steps reciprocally  Baseline:  Goal status: Ongoing  4.  Pt will be able to perform household ambulation and short distance community ambulation with LRAD. Baseline:  Goal status: Ongoing      PLAN:  PT FREQUENCY: 3x/week  PT DURATION: 8 weeks  PLANNED INTERVENTIONS: 97164- PT Re-evaluation, 97750- Physical Performance Testing, 97110-Therapeutic exercises, 97530- Therapeutic activity, 97112- Neuromuscular re-education, 97535- Self Care, [id number]- Manual therapy, [phone]- Aquatic Therapy, [id number]- Electrical stimulation (unattended), [phone]- Electrical stimulation (manual), 97016- Vasopneumatic device, Patient/Family education, Balance training, Stair training, Taping, Joint mobilization, Cryotherapy, and Moist heat  PLAN FOR NEXT SESSION:   check ROM on Friday; monitor exertion level, NuStep, gait training, knee ROM flexion and extension empasis; LE strengthening, manual techniques, vasocompression    Jarrell Laming, PT, DPT 07/19/2024, 11:54 AM  [facility] [address] [place], [place] [address] Phone # [phone] Fax [phone]   "

## 2024-07-23 ENCOUNTER — Encounter: Payer: Self-pay | Admitting: Physical Therapy

## 2024-07-23 ENCOUNTER — Ambulatory Visit: Admitting: Physical Therapy

## 2024-07-23 ENCOUNTER — Ambulatory Visit
Admission: RE | Admit: 2024-07-23 | Discharge: 2024-07-23 | Disposition: A | Discharge status: Home / Self Care | Source: Ambulatory Visit | Attending: Family Medicine | Admitting: Family Medicine

## 2024-07-23 DIAGNOSIS — M25562 Pain in left knee: Secondary | ICD-10-CM

## 2024-07-23 DIAGNOSIS — M6281 Muscle weakness (generalized): Secondary | ICD-10-CM

## 2024-07-23 DIAGNOSIS — Z1231 Encounter for screening mammogram for malignant neoplasm of breast: Secondary | ICD-10-CM

## 2024-07-23 DIAGNOSIS — R600 Localized edema: Secondary | ICD-10-CM

## 2024-07-23 NOTE — Therapy (Signed)
 " OUTPATIENT PHYSICAL THERAPY TREATMENT NOTE   Patient Name: Mary Graves MRN: [id number] DOB:1946-03-10, 78 y.o., female Today's Date: 07/23/2024      END OF SESSION:  PT End of Session - 07/23/24 1059     Visit Number 14    Date for Recertification  08/05/24    Authorization Type [employer]    Progress Note Due on Visit 20    PT Start Time 1100    PT Stop Time 1145    PT Time Calculation (min) 45 min    Activity Tolerance Patient tolerated treatment well          Past Medical History:  Diagnosis Date   Alcoholism (HCC)    Cancer of uvula (HCC)    2021   Heart murmur    Hypertension    Hypothyroidism    Past Surgical History:  Procedure Laterality Date   COLONOSCOPY     KNEE SURGERY Left    x 2   TONSILLECTOMY     TOTAL KNEE ARTHROPLASTY Left 06/04/2024   Procedure: ARTHROPLASTY, KNEE, TOTAL;  Surgeon: Ernie Cough, MD;  Location: [facility];  Service: Orthopedics;  Laterality: Left;   Patient Active Problem List   Diagnosis Date Noted   S/P total knee arthroplasty, left 06/04/2024   Elevated troponin 07/11/2014   Syncope 07/11/2014   PTSD (post-traumatic stress disorder) 10/31/2013   Alcoholism in family 10/31/2013   Dementia (HCC) 10/09/2013   HLD (hyperlipidemia) 02/27/2013   Osteopenia 02/27/2013   Alcohol  dependence (HCC) 06/01/2012   Major depression 06/01/2012   Hypothyroidism 02/23/2009   Essential hypertension 02/23/2009   HYPERGLYCEMIA 02/23/2009    PCP: Aisha Harvey MD  REFERRING PROVIDER: Ernie Cough MD  REFERRING DIAG: [phone] presence of left artificial knee joint  THERAPY DIAG:  Left knee pain; left knee stiffness Rationale for Evaluation and Treatment: Rehabilitation  ONSET DATE: surgery 06/04/24  SUBJECTIVE:   SUBJECTIVE STATEMENT: Still working on making that knee go straight.  Been using 5# weights at home.    PERTINENT HISTORY: Hx of Lt TKR 06/04/24 HTN; osteopenia; depression  Has [employer]  membership  PAIN:   Are you having pain? Yes NPRS scale: stiff /10 Pain location: left knee PAIN TYPE: aching Aggravating factors: certain movements/activity Relieving factors: cold pack   PRECAUTIONS: None     WEIGHT BEARING RESTRICTIONS: No  FALLS:  Has patient fallen in last 6 months? No  LIVING ENVIRONMENT: Lives with: lives alone but friend living with her now to help Lives in: House/apartment Stairs: Yes: External: 4 steps; on right going up Has following equipment at home: Single point cane and Environmental Consultant - 2 wheeled  OCCUPATION: retired but active doing service work Biochemist, [employer])  PLOF: Independent and Leisure: trail walking (3 miles)  PATIENT GOALS: be able to travel on an airplane by [employer]  NEXT MD VISIT: 06/20/24  OBJECTIVE:  Note: Objective measures were completed at Evaluation unless otherwise noted.  DIAGNOSTIC FINDINGS: OA left knee  PATIENT SURVEYS:  The Patient-Specific Functional Scale  Initial:  I am going to ask you to identify up to 3 important activities that you are unable to do or are having difficulty with as a result of this problem.  Today are there any activities that you are unable to do or having difficulty with because of this?  (Patient shown scale and patient rated each activity)  Follow up: When you first came in you had difficulty performing these activities.  Today do you still have difficulty?  Patient-Specific activity scoring scheme (Point to one number):  0 1 2 3 4 5 6 7 8 9  10 Unable                                                                                                          Able to perform To perform                                                                                                    activity at the same Activity         Level as before                                                                                                                       Injury or problem  Activity  Get  in/out of bed  Initial:     2                  follow up: 12/12 8 in bed, 5 out of bed  2.  Get in/out of chair  Initial:      2                 follow up:  12/12 7  3.  Walk to bathroom  Initial:      2                 follow up: 12/12   7     COGNITION: Overall cognitive status: Within functional limits for tasks assessed     SENSATION: Anterior thigh numbness  EDEMA:  Eval:  Circumferential: Rt 35cm, Lt 40.5cm with ACE bandage   LOWER EXTREMITY ROM:  Active ROM Right eval Left eval Left 06/17/24 11/21 Left 06/24/24 12/3 12/12  Hip flexion         Hip extension         Hip abduction         Hip adduction         Hip internal rotation         Hip external rotation         Knee flexion  30 A/ROM 55 P/ROM - limited by pain in quad 95  106 109 seated 114 supine   Knee extension  -12 -12 -10 -8 -7.5 supine; -15 seated   Supine and  -6 seated  Ankle dorsiflexion         Ankle plantarflexion         Ankle inversion         Ankle eversion          (Blank rows = not tested)  LOWER EXTREMITY MMT: Significant weakness of Lt hip and knee - mild quad set activation on Lt Unable to move Lt leg without external support unless in anti-gravity position Lt hip 2+/5 Lt knee 2+/5   FUNCTIONAL TESTS:  Eval: Sit to stand: heavy use of bil UE on armrests, using primarily Rt LE and kicks Lt LE out Transfers: needs external support or max A to transfer Lt LE into bed/onto table  06/19/2024: Timed up and Go (TUG):  21.96 sec with SPC 3 minute walk test: 358 ft with SPC  12/12;   Timed up and God (TUG):  7.90 no device, light use of Ues 3 minute walk test:  654 ft with no device  GAIT: Distance walked:  Assistive device utilized: Environmental Consultant - 2 wheeled Level of assistance: Modified independence Comments: ambulates with Lt LE straight                                                                                                                                 TREATMENT DATE:   07/23/2024: Recumbent bike level 2 x4 min with PT present to discuss status Manual:  belt assisted seated extension mobs grade 3; extension mobs in supine grade 3 10 sec holds 10x Seated left quad sets with foot on stool 2x10  FWD step ups on 6 step 2x10 left Heavy pink power cord TKEs 2x10 4 inch step downs 10x with bil UE support Seated HS curls green band 2x10 Modified Romanian dead lift no weight 2x10 Sit to stand from high treatment table staggered stance (most weight on left) 2x10 Squats in front of the mat holding a 10# weight 10x 2   07/19/2024: Recumbent bike level 2 x7 min with PT present to discuss status A/ROM in sitting 10 to 110 degrees Seated left quad sets with foot on stool 2x10 (able to achieve 8 degrees missing from full extension) Seated hamstring curl with green tband 2x10 bilat Standing left knee TKE with green tband 2x10 HS stretch on 2nd step with manual overpressure 10x5 sec hold FWD step ups on 6 step x10 bilat 2 inch step downs for eccentric quad control 10x Sit to stand from 24 RITFIT box with staggered stance with left leg back for increased weight bearing 2x10   07/17/24: Bike 4 min full revolution Manual:  belt assisted seated extension mobs grade 3  Seated knee flexion with slider 20x Seated with heel on the stool: endrange lift  off 2x10 Green band HS curls 10x HS stretch on 2nd step with manual overpressure 10x Squats holding to railing 10x 2 inch step downs for eccentric quad control 10x WB on left: right hip 3 ways 10x  (challenging) 6 step ups  x 10  Prone hang towel roll above knee no weight 2 min Sit to stand from 28 inch table height in staggered stance (left to the back) 10x 2  90% weight on left  07/15/24: Therapeutic Exercises and Neuro Muscular Re Ed NuStep level 6 , x 8 min 6 step ups 2 x 10 with 3 on step each leg Total knee ext against purple ball on wall 2 x 10, 5 sec holds Rockerboard x 3 min PF/DF Cable resisted  backwards walking 10# with belt 8x emphasizing knee extension, then forwards walking with CGA x 5 reps, this was very challenging for pts balance and seated rest required after due to feeling slight SOB Seated EOB with heel on 4 step for Lt HS stretch with demo for erect posture x 3 reps, 20-30 sec holds, encouraged pt to cont working on knee ext during her day just finding times to cont working these stretches in, along with weighted hang and also educated her on trying prone lying with gravity hang with foot off bed and demonstrated this for her to try as well.  Lt LAQ 5# x 10, 5 sec holds On mini tramp: Alt march focusing on stability and full Lt knee ext during FWB x 2 mins    PATIENT EDUCATION:  Education details: Educated patient on anatomy and physiology of current symptoms, prognosis, plan of care as well as initial self care strategies to promote recovery Person educated: Patient Education method: Explanation Education comprehension: verbalized understanding  HOME EXERCISE PROGRAM:  Access Code: [email or web address] URL: [email or web address] Date: 07/10/2024 Prepared by: Glade Pesa  Exercises - Supine Quad Set  - 1 x daily - 7 x weekly - 2 sets - 10 reps - 3 sec hold - Supine Hip Abduction  - 1 x daily - 7 x weekly - 2 sets - 10 reps - Supine Knee Extension Stretch on Towel Roll  - 1 x daily - 7 x weekly - 3 sets - 10 reps - Seated Heel Slide  - 1 x daily - 7 x weekly - 3 sets - 10 reps - Supine Heel Slide with Strap  - 1 x daily - 7 x weekly - 2 sets - 10 reps - Small Range Straight Leg Raise  - 1 x daily - 7 x weekly - 2 sets - 10 reps - Sit to Stand with Armchair  - 1 x daily - 7 x weekly - 2 sets - 5 reps - Standing March with Counter Support  - 1 x daily - 7 x weekly - 1-2 sets - 10 reps - Standing Hip Abduction with Counter Support  - 1 x daily - 7 x weekly - 1-2 sets - 10 reps - Heel Raises with Counter Support  - 1 x daily - 7 x weekly - 1-2 sets - 10 reps -  Standing Hip Extension with Counter Support  - 1 x daily - 7 x weekly - 1-2 sets - 10 reps - Standing Knee Flexion with Counter Support  - 1 x daily - 7 x weekly - 1-2 sets - 10 reps - Standing Knee Flexion Stretch on Step  - 1 x daily - 7 x weekly - 1 sets - 10 reps - Standing Hamstring  Stretch with Step  - 1 x daily - 7 x weekly - 1 sets - 10 reps - Gastroc Stretch with Foot at Wall  - 1 x daily - 7 x weekly - 1 sets - 10 reps - 30 hold - Forward Step Up (Mirrored)  - 1 x daily - 7 x weekly - 1 sets - 10 reps - Seated Passive Knee Extension  - 1 x daily - 7 x weekly - 1 sets - 3 reps - 60 hold  ASSESSMENT:  CLINICAL IMPRESSION: Treatment emphasis on terminal knee extension including manual techniques and quad strengthening.  Endurance much improved over the last 2 weeks.  Therapist instructing in performance of low load long duration stretching for extension every 2 hours.  Instructed in various positions and appropriate load amounts.  Therapist monitoring response to all interventions and modifying treatment accordingly.     OBJECTIVE IMPAIRMENTS: decreased activity tolerance, decreased balance, decreased mobility, difficulty walking, decreased ROM, decreased strength, increased edema, impaired perceived functional ability, and pain.   ACTIVITY LIMITATIONS: bending, standing, squatting, stairs, transfers, bathing, toileting, dressing, hygiene/grooming, and locomotion level  PARTICIPATION LIMITATIONS: meal prep, cleaning, laundry, driving, shopping, and community activity  PERSONAL FACTORS: 1-2 comorbidities: HTN are also affecting patient's functional outcome.   REHAB POTENTIAL: Good  CLINICAL DECISION MAKING: Stable/uncomplicated  EVALUATION COMPLEXITY: Low   GOALS: Goals reviewed with patient? Yes  SHORT TERM GOALS: Target date: 07/05/2024   The patient will demonstrate knowledge of basic self care strategies and exercises to promote healing  Baseline: Goal status: met  11/21  2.  Pt will be able to perform sit to stand with equal WB through bil LE with UE assist Baseline:  Goal status: met 12/3  3.  Pt will improve Lt knee A/ROM measured in supine to -4 to 90 deg Baseline:  Goal status: Ongoing (10 to 110 on 07/19/24)  4.  Pt will demo symmetrical gait with walker  Baseline:  Goal status: met 11/21    LONG TERM GOALS: Target date: 08/02/2024    The patient will be independent in a safe self progression of a home exercise program to promote further recovery of function  Baseline:  Goal status: Ongoing  2.  The patient will report a 60% improvement in pain levels with functional activities which are currently difficult including transfers, stairs, and bed mobility Baseline:  Goal status: Met on 07/19/24 (reports 80% improvement)  3.  The patient will have improved LE strength of at least 4+/5 needed to ascend and descend steps reciprocally  Baseline:  Goal status: Ongoing  4.  Pt will be able to perform household ambulation and short distance community ambulation with LRAD. Baseline:  Goal status: Ongoing      PLAN:  PT FREQUENCY: 3x/week  PT DURATION: 8 weeks  PLANNED INTERVENTIONS: 97164- PT Re-evaluation, 97750- Physical Performance Testing, 97110-Therapeutic exercises, 97530- Therapeutic activity, 97112- Neuromuscular re-education, 97535- Self Care, [id number]- Manual therapy, [phone]- Aquatic Therapy, [id number]- Electrical stimulation (unattended), [phone]- Electrical stimulation (manual), 97016- Vasopneumatic device, Patient/Family education, Balance training, Stair training, Taping, Joint mobilization, Cryotherapy, and Moist heat  PLAN FOR NEXT SESSION:  will do 2-3 aquatic PT appts in January to establish HEP;  NuStep, gait training, knee ROM flexion and extension empasis; LE strengthening, manual techniques; will taper visits with ERO   Glade Pesa, PT 07/23/2024 11:42 AM Phone: [phone] Fax: [phone]   [facility] [address], [address] [place], [place] [address] Phone # [phone] Fax [phone]   "

## 2024-07-24 ENCOUNTER — Ambulatory Visit: Admitting: Physical Therapy

## 2024-07-24 ENCOUNTER — Encounter: Payer: Self-pay | Admitting: Physical Therapy

## 2024-07-24 DIAGNOSIS — R600 Localized edema: Secondary | ICD-10-CM

## 2024-07-24 DIAGNOSIS — M25562 Pain in left knee: Secondary | ICD-10-CM

## 2024-07-24 DIAGNOSIS — M6281 Muscle weakness (generalized): Secondary | ICD-10-CM

## 2024-07-24 NOTE — Therapy (Signed)
 " OUTPATIENT PHYSICAL THERAPY TREATMENT NOTE   Patient Name: Mary Graves MRN: [id number] DOB:[birth year], 78 y.o., female Today's Date: 07/24/2024      END OF SESSION:  PT End of Session - 07/24/24 1104     Visit Number 15    Date for Recertification  08/05/24    Authorization Type [employer]    Progress Note Due on Visit 20    PT Start Time 1101    PT Stop Time 1144    PT Time Calculation (min) 43 min    Activity Tolerance Patient tolerated treatment well          Past Medical History:  Diagnosis Date   Alcoholism (HCC)    Cancer of uvula (HCC)    2021   Heart murmur    Hypertension    Hypothyroidism    Past Surgical History:  Procedure Laterality Date   COLONOSCOPY     KNEE SURGERY Left    x 2   TONSILLECTOMY     TOTAL KNEE ARTHROPLASTY Left 06/04/2024   Procedure: ARTHROPLASTY, KNEE, TOTAL;  Surgeon: Ernie Cough, MD;  Location: [facility];  Service: Orthopedics;  Laterality: Left;   Patient Active Problem List   Diagnosis Date Noted   S/P total knee arthroplasty, left 06/04/2024   Elevated troponin 07/11/2014   Syncope 07/11/2014   PTSD (post-traumatic stress disorder) 10/31/2013   Alcoholism in family 10/31/2013   Dementia (HCC) 10/09/2013   HLD (hyperlipidemia) 02/27/2013   Osteopenia 02/27/2013   Alcohol  dependence (HCC) 06/01/2012   Major depression 06/01/2012   Hypothyroidism 02/23/2009   Essential hypertension 02/23/2009   HYPERGLYCEMIA 02/23/2009    PCP: Aisha Harvey MD  REFERRING PROVIDER: Ernie Cough MD  REFERRING DIAG: [phone] presence of left artificial knee joint  THERAPY DIAG:  Left knee pain; left knee stiffness Rationale for Evaluation and Treatment: Rehabilitation  ONSET DATE: surgery 06/04/24  SUBJECTIVE:   SUBJECTIVE STATEMENT: I was pretty sore (had PT session yesterday).   PERTINENT HISTORY: Hx of Lt TKR 06/04/24 HTN; osteopenia; depression  Has [employer] membership  PAIN:   Are you having pain?  Yes NPRS scale: stiff /10 Pain location: left knee PAIN TYPE: aching Aggravating factors: certain movements/activity Relieving factors: cold pack   PRECAUTIONS: None     WEIGHT BEARING RESTRICTIONS: No  FALLS:  Has patient fallen in last 6 months? No  LIVING ENVIRONMENT: Lives with: lives alone but friend living with her now to help Lives in: House/apartment Stairs: Yes: External: 4 steps; on right going up Has following equipment at home: Single point cane and Environmental Consultant - 2 wheeled  OCCUPATION: retired but active doing service work Biochemist, [employer])  PLOF: Independent and Leisure: trail walking (3 miles)  PATIENT GOALS: be able to travel on an airplane by [employer]  NEXT MD VISIT: 06/20/24  OBJECTIVE:  Note: Objective measures were completed at Evaluation unless otherwise noted.  DIAGNOSTIC FINDINGS: OA left knee  PATIENT SURVEYS:  The Patient-Specific Functional Scale  Initial:  I am going to ask you to identify up to 3 important activities that you are unable to do or are having difficulty with as a result of this problem.  Today are there any activities that you are unable to do or having difficulty with because of this?  (Patient shown scale and patient rated each activity)  Follow up: When you first came in you had difficulty performing these activities.  Today do you still have difficulty?  Patient-Specific activity scoring scheme (Point to one number):  0 1 2 3 4 5 6 7 8 9  10 Unable                                                                                                          Able to perform To perform                                                                                                    activity at the same Activity         Level as before                                                                                                                       Injury or problem  Activity  Get in/out of bed  Initial:     2                   follow up: 12/12 8 in bed, 5 out of bed  2.  Get in/out of chair  Initial:      2                 follow up:  12/12 7  3.  Walk to bathroom  Initial:      2                 follow up: 12/12   7     COGNITION: Overall cognitive status: Within functional limits for tasks assessed     SENSATION: Anterior thigh numbness  EDEMA:  Eval:  Circumferential: Rt 35cm, Lt 40.5cm with ACE bandage   LOWER EXTREMITY ROM:  Active ROM Right eval Left eval Left 06/17/24 11/21 Left 06/24/24 12/3 12/12  Hip flexion         Hip extension         Hip abduction         Hip adduction         Hip internal rotation         Hip external rotation         Knee flexion  30 A/ROM 55 P/ROM - limited by pain  in quad 95  106 109 seated 114 supine   Knee extension  -12 -12 -10 -8 -7.5 supine; -15 seated   Supine and  -6 seated  Ankle dorsiflexion         Ankle plantarflexion         Ankle inversion         Ankle eversion          (Blank rows = not tested)  LOWER EXTREMITY MMT: Significant weakness of Lt hip and knee - mild quad set activation on Lt Unable to move Lt leg without external support unless in anti-gravity position Lt hip 2+/5 Lt knee 2+/5   FUNCTIONAL TESTS:  Eval: Sit to stand: heavy use of bil UE on armrests, using primarily Rt LE and kicks Lt LE out Transfers: needs external support or max A to transfer Lt LE into bed/onto table  06/19/2024: Timed up and Go (TUG):  21.96 sec with SPC 3 minute walk test: 358 ft with SPC  12/12;   Timed up and God (TUG):  7.90 no device, light use of Ues 3 minute walk test:  654 ft with no device  GAIT: Distance walked:  Assistive device utilized: Environmental Consultant - 2 wheeled Level of assistance: Modified independence Comments: ambulates with Lt LE straight                                                                                                                                 TREATMENT DATE:  07/24/2024: Recumbent bike level 2 x4 min  with PT present to discuss status Leg press seat 7: pt adding overpressure for extension 75# bil 2x10 85# 10x, 90# bil 10x; left only 40# 15x  Rockerboard: PF/DF and HS for tissue lengthening Manual: extension mobs in supine grade 3 10 sec holds 10x; supine HS with overpressure 5x 30 sec holds; knee flexion with overpressure 15x 6 inch step ups with right chair seat touch (limited UE touch for balance) 6 inch lateral step up with right hip abduction 10x 4 inch step down heel touch forward and toe touch back 10x (challenging) Proprioceptive challenge: WB on left with circle touches with right toe (moderate speed-random pattern) minimal UE support needed  07/23/2024: Recumbent bike level 2 x4 min with PT present to discuss status Manual:  belt assisted seated extension mobs grade 3; extension mobs in supine grade 3 10 sec holds 10x Seated left quad sets with foot on stool 2x10  FWD step ups on 6 step 2x10 left Heavy pink power cord TKEs 2x10 4 inch step downs 10x with bil UE support Seated HS curls green band 2x10 Modified Romanian dead lift no weight 2x10 Sit to stand from high treatment table staggered stance (most weight on left) 2x10 Squats in front of the mat holding a 10# weight 10x 2   07/19/2024: Recumbent bike level 2 x7 min with PT present to discuss status A/ROM in sitting 10 to 110  degrees Seated left quad sets with foot on stool 2x10 (able to achieve 8 degrees missing from full extension) Seated hamstring curl with green tband 2x10 bilat Standing left knee TKE with green tband 2x10 HS stretch on 2nd step with manual overpressure 10x5 sec hold FWD step ups on 6 step x10 bilat 2 inch step downs for eccentric quad control 10x Sit to stand from 24 RITFIT box with staggered stance with left leg back for increased weight bearing 2x10   07/17/24: Bike 4 min full revolution Manual:  belt assisted seated extension mobs grade 3  Seated knee flexion with slider 20x Seated  with heel on the stool: endrange lift off 2x10 Green band HS curls 10x HS stretch on 2nd step with manual overpressure 10x Squats holding to railing 10x 2 inch step downs for eccentric quad control 10x WB on left: right hip 3 ways 10x  (challenging) 6 step ups  x 10  Prone hang towel roll above knee no weight 2 min Sit to stand from 28 inch table height in staggered stance (left to the back) 10x 2  90% weight on left  07/15/24: Therapeutic Exercises and Neuro Muscular Re Ed NuStep level 6 , x 8 min 6 step ups 2 x 10 with 3 on step each leg Total knee ext against purple ball on wall 2 x 10, 5 sec holds Rockerboard x 3 min PF/DF Cable resisted backwards walking 10# with belt 8x emphasizing knee extension, then forwards walking with CGA x 5 reps, this was very challenging for pts balance and seated rest required after due to feeling slight SOB Seated EOB with heel on 4 step for Lt HS stretch with demo for erect posture x 3 reps, 20-30 sec holds, encouraged pt to cont working on knee ext during her day just finding times to cont working these stretches in, along with weighted hang and also educated her on trying prone lying with gravity hang with foot off bed and demonstrated this for her to try as well.  Lt LAQ 5# x 10, 5 sec holds On mini tramp: Alt march focusing on stability and full Lt knee ext during FWB x 2 mins    PATIENT EDUCATION:  Education details: Educated patient on anatomy and physiology of current symptoms, prognosis, plan of care as well as initial self care strategies to promote recovery Person educated: Patient Education method: Explanation Education comprehension: verbalized understanding  HOME EXERCISE PROGRAM:  Access Code: [email or web address] URL: [email or web address] Date: 07/10/2024 Prepared by: Glade Pesa  Exercises - Supine Quad Set  - 1 x daily - 7 x weekly - 2 sets - 10 reps - 3 sec hold - Supine Hip Abduction  - 1 x daily - 7 x weekly - 2  sets - 10 reps - Supine Knee Extension Stretch on Towel Roll  - 1 x daily - 7 x weekly - 3 sets - 10 reps - Seated Heel Slide  - 1 x daily - 7 x weekly - 3 sets - 10 reps - Supine Heel Slide with Strap  - 1 x daily - 7 x weekly - 2 sets - 10 reps - Small Range Straight Leg Raise  - 1 x daily - 7 x weekly - 2 sets - 10 reps - Sit to Stand with Armchair  - 1 x daily - 7 x weekly - 2 sets - 5 reps - Standing March with Counter Support  - 1 x daily - 7 x weekly - 1-2 sets -  10 reps - Standing Hip Abduction with Counter Support  - 1 x daily - 7 x weekly - 1-2 sets - 10 reps - Heel Raises with Counter Support  - 1 x daily - 7 x weekly - 1-2 sets - 10 reps - Standing Hip Extension with Counter Support  - 1 x daily - 7 x weekly - 1-2 sets - 10 reps - Standing Knee Flexion with Counter Support  - 1 x daily - 7 x weekly - 1-2 sets - 10 reps - Standing Knee Flexion Stretch on Step  - 1 x daily - 7 x weekly - 1 sets - 10 reps - Standing Hamstring Stretch with Step  - 1 x daily - 7 x weekly - 1 sets - 10 reps - Gastroc Stretch with Foot at Wall  - 1 x daily - 7 x weekly - 1 sets - 10 reps - 30 hold - Forward Step Up (Mirrored)  - 1 x daily - 7 x weekly - 1 sets - 10 reps - Seated Passive Knee Extension  - 1 x daily - 7 x weekly - 1 sets - 3 reps - 60 hold  ASSESSMENT:  CLINICAL IMPRESSION: Mary Graves is progressing well with quad strengthening and proprioceptive challenges with decrease reliance needed during ex's.  Continued treatment focus on endrange of motion extension with manual techniques and terminal knee extension strengthening.  She continues to be very motivated and compliant with her HEP.  Improving stamina and return to function noted.        OBJECTIVE IMPAIRMENTS: decreased activity tolerance, decreased balance, decreased mobility, difficulty walking, decreased ROM, decreased strength, increased edema, impaired perceived functional ability, and pain.   ACTIVITY LIMITATIONS: bending, standing,  squatting, stairs, transfers, bathing, toileting, dressing, hygiene/grooming, and locomotion level  PARTICIPATION LIMITATIONS: meal prep, cleaning, laundry, driving, shopping, and community activity  PERSONAL FACTORS: 1-2 comorbidities: HTN are also affecting patient's functional outcome.   REHAB POTENTIAL: Good  CLINICAL DECISION MAKING: Stable/uncomplicated  EVALUATION COMPLEXITY: Low   GOALS: Goals reviewed with patient? Yes  SHORT TERM GOALS: Target date: 07/05/2024   The patient will demonstrate knowledge of basic self care strategies and exercises to promote healing  Baseline: Goal status: met 11/21  2.  Pt will be able to perform sit to stand with equal WB through bil LE with UE assist Baseline:  Goal status: met 12/3  3.  Pt will improve Lt knee A/ROM measured in supine to -4 to 90 deg Baseline:  Goal status: Ongoing (10 to 110 on 07/19/24)  4.  Pt will demo symmetrical gait with walker  Baseline:  Goal status: met 11/21    LONG TERM GOALS: Target date: 08/02/2024    The patient will be independent in a safe self progression of a home exercise program to promote further recovery of function  Baseline:  Goal status: Ongoing  2.  The patient will report a 60% improvement in pain levels with functional activities which are currently difficult including transfers, stairs, and bed mobility Baseline:  Goal status: Met on 07/19/24 (reports 80% improvement)  3.  The patient will have improved LE strength of at least 4+/5 needed to ascend and descend steps reciprocally  Baseline:  Goal status: Ongoing  4.  Pt will be able to perform household ambulation and short distance community ambulation with LRAD. Baseline:  Goal status: met 12/24      PLAN:  PT FREQUENCY: 3x/week  PT DURATION: 8 weeks  PLANNED INTERVENTIONS: 02835- PT Re-evaluation, 97750-  Physical Performance Testing, 97110-Therapeutic exercises, 97530- Therapeutic activity, [id number]- Neuromuscular  re-education, [phone]- Self Care, 02859- Manual therapy, [phone]- Aquatic Therapy, [phone]- Electrical stimulation (unattended), [phone]- Electrical stimulation (manual), 97016- Vasopneumatic device, Patient/Family education, Balance training, Stair training, Taping, Joint mobilization, Cryotherapy, and Moist heat  PLAN FOR NEXT SESSION:  will do 2-3 aquatic PT appts in January to establish HEP;  NuStep, gait training, knee ROM flexion and extension empasis; LE strengthening, manual techniques; will taper visits with ERO   Glade Pesa, PT 07/24/2024 12:53 PM Phone: [phone] Fax: [phone]   [facility] [address], [address] [place], [place] [address] Phone # [phone] Fax [phone]   "

## 2024-07-29 ENCOUNTER — Other Ambulatory Visit (HOSPITAL_COMMUNITY): Payer: Self-pay

## 2024-07-29 ENCOUNTER — Ambulatory Visit

## 2024-07-29 DIAGNOSIS — M6281 Muscle weakness (generalized): Secondary | ICD-10-CM

## 2024-07-29 DIAGNOSIS — M25562 Pain in left knee: Secondary | ICD-10-CM

## 2024-07-29 DIAGNOSIS — R600 Localized edema: Secondary | ICD-10-CM

## 2024-07-29 DIAGNOSIS — R2689 Other abnormalities of gait and mobility: Secondary | ICD-10-CM

## 2024-07-29 NOTE — Therapy (Signed)
 " OUTPATIENT PHYSICAL THERAPY TREATMENT NOTE   Patient Name: Mary Graves MRN: [id number] DOB:[birth year], 78 y.o., female Today's Date: 07/29/2024      END OF SESSION:  PT End of Session - 07/29/24 1147     Visit Number 16    Date for Recertification  08/05/24    Authorization Type [employer]    Progress Note Due on Visit 20    PT Start Time 1103    PT Stop Time 1144    PT Time Calculation (min) 41 min    Activity Tolerance Patient tolerated treatment well    Behavior During Therapy [facility] for tasks assessed/performed           Past Medical History:  Diagnosis Date   Alcoholism (HCC)    Cancer of uvula (HCC)    2021   Heart murmur    Hypertension    Hypothyroidism    Past Surgical History:  Procedure Laterality Date   COLONOSCOPY     KNEE SURGERY Left    x 2   TONSILLECTOMY     TOTAL KNEE ARTHROPLASTY Left 06/04/2024   Procedure: ARTHROPLASTY, KNEE, TOTAL;  Surgeon: Ernie Cough, MD;  Location: [facility];  Service: Orthopedics;  Laterality: Left;   Patient Active Problem List   Diagnosis Date Noted   S/P total knee arthroplasty, left 06/04/2024   Elevated troponin 07/11/2014   Syncope 07/11/2014   PTSD (post-traumatic stress disorder) 10/31/2013   Alcoholism in family 10/31/2013   Dementia (HCC) 10/09/2013   HLD (hyperlipidemia) 02/27/2013   Osteopenia 02/27/2013   Alcohol  dependence (HCC) 06/01/2012   Major depression 06/01/2012   Hypothyroidism 02/23/2009   Essential hypertension 02/23/2009   HYPERGLYCEMIA 02/23/2009    PCP: Aisha Harvey MD  REFERRING PROVIDER: Ernie Cough MD  REFERRING DIAG: [phone] presence of left artificial knee joint  THERAPY DIAG:  Left knee pain; left knee stiffness Rationale for Evaluation and Treatment: Rehabilitation  ONSET DATE: surgery 06/04/24  SUBJECTIVE:   SUBJECTIVE STATEMENT: I walked in the pool on Saturday.  Overall doing well   PERTINENT HISTORY: Hx of Lt TKR 06/04/24 HTN; osteopenia;  depression  Has [employer] membership  PAIN:   Are you having pain? Yes NPRS scale: stiff /10 Pain location: left knee PAIN TYPE: aching Aggravating factors: certain movements/activity Relieving factors: cold pack   PRECAUTIONS: None     WEIGHT BEARING RESTRICTIONS: No  FALLS:  Has patient fallen in last 6 months? No  LIVING ENVIRONMENT: Lives with: lives alone but friend living with her now to help Lives in: House/apartment Stairs: Yes: External: 4 steps; on right going up Has following equipment at home: Single point cane and Environmental Consultant - 2 wheeled  OCCUPATION: retired but active doing service work Biochemist, Clinical)  PLOF: Independent and Leisure: trail walking (3 miles)  PATIENT GOALS: be able to travel on an airplane by [employer]  NEXT MD VISIT: 06/20/24  OBJECTIVE:  Note: Objective measures were completed at Evaluation unless otherwise noted.  DIAGNOSTIC FINDINGS: OA left knee  PATIENT SURVEYS:  The Patient-Specific Functional Scale  Initial:  I am going to ask you to identify up to 3 important activities that you are unable to do or are having difficulty with as a result of this problem.  Today are there any activities that you are unable to do or having difficulty with because of this?  (Patient shown scale and patient rated each activity)  Follow up: When you first came in you had difficulty performing these activities.  Today  do you still have difficulty?  Patient-Specific activity scoring scheme (Point to one number):  0 1 2 3 4 5 6 7 8 9  10 Unable                                                                                                          Able to perform To perform                                                                                                    activity at the same Activity         Level as before                                                                                                                       Injury or  problem  Activity  Get in/out of bed  Initial:     2                  follow up: 12/12 8 in bed, 5 out of bed  2.  Get in/out of chair  Initial:      2                 follow up:  12/12 7  3.  Walk to bathroom  Initial:      2                 follow up: 12/12   7     COGNITION: Overall cognitive status: Within functional limits for tasks assessed     SENSATION: Anterior thigh numbness  EDEMA:  Eval:  Circumferential: Rt 35cm, Lt 40.5cm with ACE bandage   LOWER EXTREMITY ROM:  Active ROM Right eval Left eval Left 06/17/24 11/21 Left 06/24/24 12/3 12/12  Hip flexion         Hip extension         Hip abduction         Hip adduction         Hip internal rotation         Hip external rotation  Knee flexion  30 A/ROM 55 P/ROM - limited by pain in quad 95  106 109 seated 114 supine   Knee extension  -12 -12 -10 -8 -7.5 supine; -15 seated   Supine and  -6 seated  Ankle dorsiflexion         Ankle plantarflexion         Ankle inversion         Ankle eversion          (Blank rows = not tested)  LOWER EXTREMITY MMT: Significant weakness of Lt hip and knee - mild quad set activation on Lt Unable to move Lt leg without external support unless in anti-gravity position Lt hip 2+/5 Lt knee 2+/5   FUNCTIONAL TESTS:  Eval: Sit to stand: heavy use of bil UE on armrests, using primarily Rt LE and kicks Lt LE out Transfers: needs external support or max A to transfer Lt LE into bed/onto table  06/19/2024: Timed up and Go (TUG):  21.96 sec with SPC 3 minute walk test: 358 ft with SPC  12/12;   Timed up and God (TUG):  7.90 no device, light use of Ues 3 minute walk test:  654 ft with no device  GAIT: Distance walked:  Assistive device utilized: Environmental Consultant - 2 wheeled Level of assistance: Modified independence Comments: ambulates with Lt LE straight                                                                                                                                  TREATMENT DATE:  07/29/2024: NuStep level 5x 7 min with PT present to discuss status Leg press seat 7: pt adding overpressure for extension 90# bil 2x10; left only 40# 2x10 Rockerboard: PF/DF and HS for tissue lengthening x 3 min  Standing hamstring stretch on power plate 6k69 seconds with overpressure  6 inch step ups with right with min UE support 2x10 6 inch lateral step up with right hip abduction 2x10  4 inch step down heel touch forward and toe touch back 10x (challenging) Proprioceptive challenge: WB on left with circle touches with right toe (moderate speed-random pattern) minimal UE support needed   07/24/2024: Recumbent bike level 2 x4 min with PT present to discuss status Leg press seat 7: pt adding overpressure for extension 75# bil 2x10 85# 10x, 90# bil 10x; left only 40# 15x  Rockerboard: PF/DF and HS for tissue lengthening Manual: extension mobs in supine grade 3 10 sec holds 10x; supine HS with overpressure 5x 30 sec holds; knee flexion with overpressure 15x 6 inch step ups with min UE support 2x10 on Lt 6 inch lateral step up with right hip abduction 2x10 4 inch step down heel touch forward and toe touch back 10x (challenging) Proprioceptive challenge: WB on left with circle touches with right toe (moderate speed-random pattern) minimal UE support needed  07/23/2024: Recumbent bike level 2 x4 min with PT present to discuss  status Manual:  belt assisted seated extension mobs grade 3; extension mobs in supine grade 3 10 sec holds 10x Seated left quad sets with foot on stool 2x10  FWD step ups on 6 step 2x10 left Heavy pink power cord TKEs 2x10 4 inch step downs 10x with bil UE support Seated HS curls green band 2x10 Modified Romanian dead lift no weight 2x10 Sit to stand from high treatment table staggered stance (most weight on left) 2x10 Squats in front of the mat holding a 10# weight 10x 2    PATIENT EDUCATION:  Education details: Educated patient on  anatomy and physiology of current symptoms, prognosis, plan of care as well as initial self care strategies to promote recovery Person educated: Patient Education method: Explanation Education comprehension: verbalized understanding  HOME EXERCISE PROGRAM:  Access Code: [email or web address] URL: [email or web address] Date: 07/10/2024 Prepared by: Glade Pesa  Exercises - Supine Quad Set  - 1 x daily - 7 x weekly - 2 sets - 10 reps - 3 sec hold - Supine Hip Abduction  - 1 x daily - 7 x weekly - 2 sets - 10 reps - Supine Knee Extension Stretch on Towel Roll  - 1 x daily - 7 x weekly - 3 sets - 10 reps - Seated Heel Slide  - 1 x daily - 7 x weekly - 3 sets - 10 reps - Supine Heel Slide with Strap  - 1 x daily - 7 x weekly - 2 sets - 10 reps - Small Range Straight Leg Raise  - 1 x daily - 7 x weekly - 2 sets - 10 reps - Sit to Stand with Armchair  - 1 x daily - 7 x weekly - 2 sets - 5 reps - Standing March with Counter Support  - 1 x daily - 7 x weekly - 1-2 sets - 10 reps - Standing Hip Abduction with Counter Support  - 1 x daily - 7 x weekly - 1-2 sets - 10 reps - Heel Raises with Counter Support  - 1 x daily - 7 x weekly - 1-2 sets - 10 reps - Standing Hip Extension with Counter Support  - 1 x daily - 7 x weekly - 1-2 sets - 10 reps - Standing Knee Flexion with Counter Support  - 1 x daily - 7 x weekly - 1-2 sets - 10 reps - Standing Knee Flexion Stretch on Step  - 1 x daily - 7 x weekly - 1 sets - 10 reps - Standing Hamstring Stretch with Step  - 1 x daily - 7 x weekly - 1 sets - 10 reps - Gastroc Stretch with Foot at Wall  - 1 x daily - 7 x weekly - 1 sets - 10 reps - 30 hold - Forward Step Up (Mirrored)  - 1 x daily - 7 x weekly - 1 sets - 10 reps - Seated Passive Knee Extension  - 1 x daily - 7 x weekly - 1 sets - 3 reps - 60 hold  ASSESSMENT:  CLINICAL IMPRESSION: Pt is making steady progress s/p Lt TKA.  Pt requires less UE support with step-ups and step-down exercises.   Proprioception and endurance are improving.  PT monitored throughout session for pain, fatigue and form. She continues to be very motivated and compliant with her HEP.  Improving stamina and return to function noted.  Patient will benefit from skilled PT to address the below impairments and improve overall function.  OBJECTIVE IMPAIRMENTS: decreased activity tolerance, decreased balance, decreased mobility, difficulty walking, decreased ROM, decreased strength, increased edema, impaired perceived functional ability, and pain.   ACTIVITY LIMITATIONS: bending, standing, squatting, stairs, transfers, bathing, toileting, dressing, hygiene/grooming, and locomotion level  PARTICIPATION LIMITATIONS: meal prep, cleaning, laundry, driving, shopping, and community activity  PERSONAL FACTORS: 1-2 comorbidities: HTN are also affecting patient's functional outcome.   REHAB POTENTIAL: Good  CLINICAL DECISION MAKING: Stable/uncomplicated  EVALUATION COMPLEXITY: Low   GOALS: Goals reviewed with patient? Yes  SHORT TERM GOALS: Target date: 07/05/2024   The patient will demonstrate knowledge of basic self care strategies and exercises to promote healing  Baseline: Goal status: met 11/21  2.  Pt will be able to perform sit to stand with equal WB through bil LE with UE assist Baseline:  Goal status: met 12/3  3.  Pt will improve Lt knee A/ROM measured in supine to -4 to 90 deg Baseline:  Goal status: Ongoing (10 to 110 on 07/19/24)  4.  Pt will demo symmetrical gait with walker  Baseline:  Goal status: met 11/21    LONG TERM GOALS: Target date: 08/05/2024    The patient will be independent in a safe self progression of a home exercise program to promote further recovery of function  Baseline:  Goal status: Ongoing  2.  The patient will report a 60% improvement in pain levels with functional activities which are currently difficult including transfers, stairs, and bed  mobility Baseline:  Goal status: Met on 07/19/24 (reports 80% improvement)  3.  The patient will have improved LE strength of at least 4+/5 needed to ascend and descend steps reciprocally  Baseline:  Goal status: Ongoing  4.  Pt will be able to perform household ambulation and short distance community ambulation with LRAD. Baseline:  Goal status: met 12/24      PLAN:  PT FREQUENCY: 3x/week  PT DURATION: 8 weeks  PLANNED INTERVENTIONS: 97164- PT Re-evaluation, 97750- Physical Performance Testing, 97110-Therapeutic exercises, 97530- Therapeutic activity, 97112- Neuromuscular re-education, 97535- Self Care, 02859- Manual therapy, [phone]- Aquatic Therapy, [id number]- Electrical stimulation (unattended), [phone]- Electrical stimulation (manual), 97016- Vasopneumatic device, Patient/Family education, Balance training, Stair training, Taping, Joint mobilization, Cryotherapy, and Moist heat  PLAN FOR NEXT SESSION:  will do 2-3 aquatic PT appts in January to establish HEP;  NuStep, gait training, knee ROM flexion and extension empasis; LE strengthening, manual techniques; will taper visits with ERO   Burnard Joy, PT 07/29/2024 11:47 AM    [facility] [address], [address] [place], [place] [address] Phone # [phone] Fax [phone]   "

## 2024-07-31 ENCOUNTER — Ambulatory Visit: Admitting: Physical Therapy

## 2024-07-31 ENCOUNTER — Encounter: Payer: Self-pay | Admitting: Physical Therapy

## 2024-07-31 DIAGNOSIS — R600 Localized edema: Secondary | ICD-10-CM

## 2024-07-31 DIAGNOSIS — M6281 Muscle weakness (generalized): Secondary | ICD-10-CM

## 2024-07-31 DIAGNOSIS — M25562 Pain in left knee: Secondary | ICD-10-CM | POA: Diagnosis not present

## 2024-07-31 NOTE — Therapy (Signed)
 " OUTPATIENT PHYSICAL THERAPY TREATMENT NOTE   Patient Name: Mary Graves MRN: [id number] DOB:1946/01/20, 78 y.o., female Today's Date: 07/31/2024      END OF SESSION:  PT End of Session - 07/31/24 1104     Visit Number 17    Date for Recertification  08/05/24    Authorization Type [employer]    Progress Note Due on Visit 20    PT Start Time 1101    PT Stop Time 1144    PT Time Calculation (min) 43 min    Activity Tolerance Patient tolerated treatment well           Past Medical History:  Diagnosis Date   Alcoholism (HCC)    Cancer of uvula (HCC)    2021   Heart murmur    Hypertension    Hypothyroidism    Past Surgical History:  Procedure Laterality Date   COLONOSCOPY     KNEE SURGERY Left    x 2   TONSILLECTOMY     TOTAL KNEE ARTHROPLASTY Left 06/04/2024   Procedure: ARTHROPLASTY, KNEE, TOTAL;  Surgeon: Ernie Cough, MD;  Location: [facility];  Service: Orthopedics;  Laterality: Left;   Patient Active Problem List   Diagnosis Date Noted   S/P total knee arthroplasty, left 06/04/2024   Elevated troponin 07/11/2014   Syncope 07/11/2014   PTSD (post-traumatic stress disorder) 10/31/2013   Alcoholism in family 10/31/2013   Dementia (HCC) 10/09/2013   HLD (hyperlipidemia) 02/27/2013   Osteopenia 02/27/2013   Alcohol  dependence (HCC) 06/01/2012   Major depression 06/01/2012   Hypothyroidism 02/23/2009   Essential hypertension 02/23/2009   HYPERGLYCEMIA 02/23/2009    PCP: Aisha Harvey MD  REFERRING PROVIDER: Ernie Cough MD  REFERRING DIAG: [phone] presence of left artificial knee joint  THERAPY DIAG:  Left knee pain; left knee stiffness Rationale for Evaluation and Treatment: Rehabilitation  ONSET DATE: surgery 06/04/24  SUBJECTIVE:   SUBJECTIVE STATEMENT: I feel it a little bit which is good.   PERTINENT HISTORY: Hx of Lt TKR 06/04/24 HTN; osteopenia; depression  Has [employer] membership  PAIN:   Are you having pain? Yes NPRS  scale: stiff /10 Pain location: left knee PAIN TYPE: aching Aggravating factors: certain movements/activity Relieving factors: cold pack   PRECAUTIONS: None     WEIGHT BEARING RESTRICTIONS: No  FALLS:  Has patient fallen in last 6 months? No  LIVING ENVIRONMENT: Lives with: lives alone but friend living with her now to help Lives in: House/apartment Stairs: Yes: External: 4 steps; on right going up Has following equipment at home: Single point cane and Environmental Consultant - 2 wheeled  OCCUPATION: retired but active doing service work Biochemist, [employer])  PLOF: Independent and Leisure: trail walking (3 miles)  PATIENT GOALS: be able to travel on an airplane by [employer]  NEXT MD VISIT: 06/20/24  OBJECTIVE:  Note: Objective measures were completed at Evaluation unless otherwise noted.  DIAGNOSTIC FINDINGS: OA left knee  PATIENT SURVEYS:  The Patient-Specific Functional Scale  Initial:  I am going to ask you to identify up to 3 important activities that you are unable to do or are having difficulty with as a result of this problem.  Today are there any activities that you are unable to do or having difficulty with because of this?  (Patient shown scale and patient rated each activity)  Follow up: When you first came in you had difficulty performing these activities.  Today do you still have difficulty?  Patient-Specific activity scoring scheme (Point to  one number):  0 1 2 3 4 5 6 7 8 9  10 Unable                                                                                                          Able to perform To perform                                                                                                    activity at the same Activity         Level as before                                                                                                                       Injury or problem  Activity  Get in/out of bed  Initial:     2                  follow  up: 12/12 8 in bed, 5 out of bed  2.  Get in/out of chair  Initial:      2                 follow up:  12/12 7  3.  Walk to bathroom  Initial:      2                 follow up: 12/12   7     COGNITION: Overall cognitive status: Within functional limits for tasks assessed     SENSATION: Anterior thigh numbness  EDEMA:  Eval:  Circumferential: Rt 35cm, Lt 40.5cm with ACE bandage   LOWER EXTREMITY ROM:  Active ROM Right eval Left eval Left 06/17/24 11/21 Left 06/24/24 12/3 12/12  Hip flexion         Hip extension         Hip abduction         Hip adduction         Hip internal rotation         Hip external rotation         Knee flexion  30 A/ROM 55 P/ROM -  limited by pain in quad 95  106 109 seated 114 supine   Knee extension  -12 -12 -10 -8 -7.5 supine; -15 seated   Supine and  -6 seated  Ankle dorsiflexion         Ankle plantarflexion         Ankle inversion         Ankle eversion          (Blank rows = not tested)  LOWER EXTREMITY MMT: Significant weakness of Lt hip and knee - mild quad set activation on Lt Unable to move Lt leg without external support unless in anti-gravity position Lt hip 2+/5 Lt knee 2+/5   FUNCTIONAL TESTS:  Eval: Sit to stand: heavy use of bil UE on armrests, using primarily Rt LE and kicks Lt LE out Transfers: needs external support or max A to transfer Lt LE into bed/onto table  06/19/2024: Timed up and Go (TUG):  21.96 sec with SPC 3 minute walk test: 358 ft with SPC  12/12;   Timed up and God (TUG):  7.90 no device, light use of Ues 3 minute walk test:  654 ft with no device  GAIT: Distance walked:  Assistive device utilized: Environmental Consultant - 2 wheeled Level of assistance: Modified independence Comments: ambulates with Lt LE straight                                                                                                                                 TREATMENT DATE:  07/31/2024: Recumbent bike level 2 x4 min with PT  present to discuss status 2nd step: extension mobilization with foot on plate weight for ankle dorsiflexion 5x;  therapist adding overpressure with stretch out strap 30 sec 3x 4 inch lateral step down 3 points (challenging) 10x 6 inch step up with follow through 10x Single leg Romanian dead lift with red power cord anchored under left foot 15x Leg press seat 7: pt adding overpressure for extension  90# bil 10x; left only 40# 15x (attempted 45# but too difficult) Hip matrix 40# 10x each: flexion, extension, abduction right/left Proprioceptive challenge: WB on left with hip 3 ways minimal UE support needed Ladder agility 2 laps each: high step walking (challenging); side step in squat position; in/out of each box  07/29/2024: NuStep level 5x 7 min with PT present to discuss status Leg press seat 7: pt adding overpressure for extension 90# bil 2x10; left only 40# 2x10 Rockerboard: PF/DF and HS for tissue lengthening x 3 min  Standing hamstring stretch on power plate 6k69 seconds with overpressure  6 inch step ups with right with min UE support 2x10 6 inch lateral step up with right hip abduction 2x10  4 inch step down heel touch forward and toe touch back 10x (challenging) Proprioceptive challenge: WB on left with circle touches with right toe (moderate speed-random pattern) minimal UE support needed   07/24/2024: Recumbent bike level 2 x4 min with  PT present to discuss status Leg press seat 7: pt adding overpressure for extension 75# bil 2x10 85# 10x, 90# bil 10x; left only 40# 15x  Rockerboard: PF/DF and HS for tissue lengthening Manual: extension mobs in supine grade 3 10 sec holds 10x; supine HS with overpressure 5x 30 sec holds; knee flexion with overpressure 15x 6 inch step ups with min UE support 2x10 on Lt 6 inch lateral step up with right hip abduction 2x10 4 inch step down heel touch forward and toe touch back 10x (challenging) Proprioceptive challenge: WB on left with circle  touches with right toe (moderate speed-random pattern) minimal UE support needed  07/23/2024: Recumbent bike level 2 x4 min with PT present to discuss status Manual:  belt assisted seated extension mobs grade 3; extension mobs in supine grade 3 10 sec holds 10x Seated left quad sets with foot on stool 2x10  FWD step ups on 6 step 2x10 left Heavy pink power cord TKEs 2x10 4 inch step downs 10x with bil UE support Seated HS curls green band 2x10 Modified Romanian dead lift no weight 2x10 Sit to stand from high treatment table staggered stance (most weight on left) 2x10 Squats in front of the mat holding a 10# weight 10x 2    PATIENT EDUCATION:  Education details: Educated patient on anatomy and physiology of current symptoms, prognosis, plan of care as well as initial self care strategies to promote recovery Person educated: Patient Education method: Explanation Education comprehension: verbalized understanding  HOME EXERCISE PROGRAM:  Access Code: [email or web address] URL: [email or web address] Date: 07/10/2024 Prepared by: Glade Pesa  Exercises - Supine Quad Set  - 1 x daily - 7 x weekly - 2 sets - 10 reps - 3 sec hold - Supine Hip Abduction  - 1 x daily - 7 x weekly - 2 sets - 10 reps - Supine Knee Extension Stretch on Towel Roll  - 1 x daily - 7 x weekly - 3 sets - 10 reps - Seated Heel Slide  - 1 x daily - 7 x weekly - 3 sets - 10 reps - Supine Heel Slide with Strap  - 1 x daily - 7 x weekly - 2 sets - 10 reps - Small Range Straight Leg Raise  - 1 x daily - 7 x weekly - 2 sets - 10 reps - Sit to Stand with Armchair  - 1 x daily - 7 x weekly - 2 sets - 5 reps - Standing March with Counter Support  - 1 x daily - 7 x weekly - 1-2 sets - 10 reps - Standing Hip Abduction with Counter Support  - 1 x daily - 7 x weekly - 1-2 sets - 10 reps - Heel Raises with Counter Support  - 1 x daily - 7 x weekly - 1-2 sets - 10 reps - Standing Hip Extension with Counter Support  - 1 x  daily - 7 x weekly - 1-2 sets - 10 reps - Standing Knee Flexion with Counter Support  - 1 x daily - 7 x weekly - 1-2 sets - 10 reps - Standing Knee Flexion Stretch on Step  - 1 x daily - 7 x weekly - 1 sets - 10 reps - Standing Hamstring Stretch with Step  - 1 x daily - 7 x weekly - 1 sets - 10 reps - Gastroc Stretch with Foot at Wall  - 1 x daily - 7 x weekly - 1 sets - 10 reps - 30 hold -  Forward Step Up (Mirrored)  - 1 x daily - 7 x weekly - 1 sets - 10 reps - Seated Passive Knee Extension  - 1 x daily - 7 x weekly - 1 sets - 3 reps - 60 hold  ASSESSMENT:  CLINICAL IMPRESSION: Good progress with knee ROM with emphasis on extension ROM.  Improving quad motor control although eccentric quad control still challenging (step downs).  Decreased balance with single leg standing on surgical side.  She requires close CGA and min assist to recover from loss of balance particularly with high step walking through the agility ladder.  Therapist providing verbal cues to optimize technique with  exercises in order to achieve the greatest benefit.       OBJECTIVE IMPAIRMENTS: decreased activity tolerance, decreased balance, decreased mobility, difficulty walking, decreased ROM, decreased strength, increased edema, impaired perceived functional ability, and pain.   ACTIVITY LIMITATIONS: bending, standing, squatting, stairs, transfers, bathing, toileting, dressing, hygiene/grooming, and locomotion level  PARTICIPATION LIMITATIONS: meal prep, cleaning, laundry, driving, shopping, and community activity  PERSONAL FACTORS: 1-2 comorbidities: HTN are also affecting patient's functional outcome.   REHAB POTENTIAL: Good  CLINICAL DECISION MAKING: Stable/uncomplicated  EVALUATION COMPLEXITY: Low   GOALS: Goals reviewed with patient? Yes  SHORT TERM GOALS: Target date: 07/05/2024   The patient will demonstrate knowledge of basic self care strategies and exercises to promote healing  Baseline: Goal  status: met 11/21  2.  Pt will be able to perform sit to stand with equal WB through bil LE with UE assist Baseline:  Goal status: met 12/3  3.  Pt will improve Lt knee A/ROM measured in supine to -4 to 90 deg Baseline:  Goal status: Ongoing (10 to 110 on 07/19/24)  4.  Pt will demo symmetrical gait with walker  Baseline:  Goal status: met 11/21    LONG TERM GOALS: Target date: 08/05/2024    The patient will be independent in a safe self progression of a home exercise program to promote further recovery of function  Baseline:  Goal status: Ongoing  2.  The patient will report a 60% improvement in pain levels with functional activities which are currently difficult including transfers, stairs, and bed mobility Baseline:  Goal status: Met on 07/19/24 (reports 80% improvement)  3.  The patient will have improved LE strength of at least 4+/5 needed to ascend and descend steps reciprocally  Baseline:  Goal status: Ongoing  4.  Pt will be able to perform household ambulation and short distance community ambulation with LRAD. Baseline:  Goal status: met 12/24      PLAN:  PT FREQUENCY: 3x/week  PT DURATION: 8 weeks  PLANNED INTERVENTIONS: 97164- PT Re-evaluation, 97750- Physical Performance Testing, 97110-Therapeutic exercises, 97530- Therapeutic activity, 97112- Neuromuscular re-education, 97535- Self Care, [id number]- Manual therapy, [phone]- Aquatic Therapy, [id number]- Electrical stimulation (unattended), [phone]- Electrical stimulation (manual), 97016- Vasopneumatic device, Patient/Family education, Balance training, Stair training, Taping, Joint mobilization, Cryotherapy, and Moist heat  PLAN FOR NEXT SESSION:  ERO next visit; will do 2-3 aquatic PT appts in January to establish HEP;  NuStep, gait training, knee ROM flexion and extension empasis; LE strengthening, manual techniques; will taper visits with ERO    Glade Pesa, PT 07/31/2024 11:52 AM Phone: [phone] Fax:  [phone]  [facility] [address] [place], [place] [address] Phone # [phone] Fax [phone]   "

## 2024-08-02 ENCOUNTER — Ambulatory Visit: Attending: Family Medicine | Admitting: Physical Therapy

## 2024-08-02 ENCOUNTER — Encounter: Payer: Self-pay | Admitting: Physical Therapy

## 2024-08-02 DIAGNOSIS — M25562 Pain in left knee: Secondary | ICD-10-CM | POA: Diagnosis present

## 2024-08-02 DIAGNOSIS — M6281 Muscle weakness (generalized): Secondary | ICD-10-CM | POA: Diagnosis present

## 2024-08-02 DIAGNOSIS — R600 Localized edema: Secondary | ICD-10-CM | POA: Diagnosis present

## 2024-08-02 DIAGNOSIS — R2689 Other abnormalities of gait and mobility: Secondary | ICD-10-CM | POA: Diagnosis present

## 2024-08-02 NOTE — Therapy (Signed)
 " OUTPATIENT PHYSICAL THERAPY TREATMENT NOTE/RECERTIFICATION   Patient Name: Mary Graves MRN: [id number] DOB:07-Jan-1946, 79 y.o., female Today's Date: 08/02/2024      END OF SESSION:  PT End of Session - 08/02/24 0929     Visit Number 18    Date for Recertification  09/27/24    Authorization Type [employer]    Progress Note Due on Visit 20    PT Start Time 0930    PT Stop Time 1014    PT Time Calculation (min) 44 min    Activity Tolerance Patient tolerated treatment well           Past Medical History:  Diagnosis Date   Alcoholism (HCC)    Cancer of uvula (HCC)    2021   Heart murmur    Hypertension    Hypothyroidism    Past Surgical History:  Procedure Laterality Date   COLONOSCOPY     KNEE SURGERY Left    x 2   TONSILLECTOMY     TOTAL KNEE ARTHROPLASTY Left 06/04/2024   Procedure: ARTHROPLASTY, KNEE, TOTAL;  Surgeon: Ernie Cough, MD;  Location: [facility];  Service: Orthopedics;  Laterality: Left;   Patient Active Problem List   Diagnosis Date Noted   S/P total knee arthroplasty, left 06/04/2024   Elevated troponin 07/11/2014   Syncope 07/11/2014   PTSD (post-traumatic stress disorder) 10/31/2013   Alcoholism in family 10/31/2013   Dementia (HCC) 10/09/2013   HLD (hyperlipidemia) 02/27/2013   Osteopenia 02/27/2013   Alcohol  dependence (HCC) 06/01/2012   Major depression 06/01/2012   Hypothyroidism 02/23/2009   Essential hypertension 02/23/2009   HYPERGLYCEMIA 02/23/2009    PCP: Aisha Harvey MD  REFERRING PROVIDER: Ernie Cough MD  REFERRING DIAG: [id number] presence of left artificial knee joint  THERAPY DIAG:  Left knee pain; left knee stiffness Rationale for Evaluation and Treatment: Rehabilitation  ONSET DATE: surgery 06/04/24  SUBJECTIVE:   SUBJECTIVE STATEMENT: My knee is a little sore today.  I don't know why.  Doing 2# weight on thigh every 2 hours (to work on extension)   PERTINENT HISTORY: Hx of Lt TKR 06/04/24 HTN;  osteopenia; depression  Has [employer] membership  PAIN:   Are you having pain? Yes NPRS scale:soreness /10 Pain location: left knee PAIN TYPE: aching Aggravating factors: certain movements/activity Relieving factors: cold pack   PRECAUTIONS: None     WEIGHT BEARING RESTRICTIONS: No  FALLS:  Has patient fallen in last 6 months? No  LIVING ENVIRONMENT: Lives with: lives alone but friend living with her now to help Lives in: House/apartment Stairs: Yes: External: 4 steps; on right going up Has following equipment at home: Single point cane and Environmental Consultant - 2 wheeled  OCCUPATION: retired but active doing service work Biochemist, [employer])  PLOF: Independent and Leisure: trail walking (3 miles)  PATIENT GOALS: be able to travel on an airplane by [employer]  NEXT MD VISIT: 06/20/24  OBJECTIVE:  Note: Objective measures were completed at Evaluation unless otherwise noted.  DIAGNOSTIC FINDINGS: OA left knee  PATIENT SURVEYS:  The Patient-Specific Functional Scale  Initial:  I am going to ask you to identify up to 3 important activities that you are unable to do or are having difficulty with as a result of this problem.  Today are there any activities that you are unable to do or having difficulty with because of this?  (Patient shown scale and patient rated each activity)  Follow up: When you first came in you had difficulty performing these  activities.  Today do you still have difficulty?  Patient-Specific activity scoring scheme (Point to one number):  0 1 2 3 4 5 6 7 8 9  10 Unable                                                                                                          Able to perform To perform                                                                                                    activity at the same Activity         Level as before                                                                                                                        Injury or problem  Activity  Get in/out of bed  Initial:     2                  follow up: 12/12 8 in bed, 5 out of bed    08/02/24: 9 2.  Get in/out of chair  Initial:      2                 follow up:  12/12 7                                      08/02/24:   8  3.  Walk to bathroom  Initial:      2                 follow up: 12/12   7                                     08/02/24:   9     COGNITION: Overall cognitive status: Within functional limits for tasks assessed     SENSATION: Anterior thigh numbness  EDEMA:  Eval:  Circumferential: Rt 35cm, Lt 40.5cm with ACE bandage   LOWER EXTREMITY ROM:  Active ROM Right eval Left eval Left 06/17/24 11/21 Left 06/24/24 12/3 12/12 1/2  Hip flexion          Hip extension          Hip abduction          Hip adduction          Hip internal rotation          Hip external rotation          Knee flexion  30 A/ROM 55 P/ROM - limited by pain in quad 95  106 109 seated 114 supine  121 Supine, seated 120  Knee extension  -12 -12 -10 -8 -7.5 supine; -15 seated   Supine and  -6 seated Supine -4.5,  Seated -6  Ankle dorsiflexion          Ankle plantarflexion          Ankle inversion          Ankle eversion           (Blank rows = not tested)  LOWER EXTREMITY MMT: Significant weakness of Lt hip and knee - mild quad set activation on Lt Unable to move Lt leg without external support unless in anti-gravity position Lt hip 2+/5 Lt knee 2+/5   FUNCTIONAL TESTS:  Eval: Sit to stand: heavy use of bil UE on armrests, using primarily Rt LE and kicks Lt LE out Transfers: needs external support or max A to transfer Lt LE into bed/onto table  06/19/2024: Timed up and Go (TUG):  21.96 sec with SPC 3 minute walk test: 358 ft with SPC  12/12;   Timed up and God (TUG):  7.90 no device, light use of Ues 3 minute walk test:  654 ft with no device  GAIT: Distance walked:  Assistive device utilized: Environmental Consultant - 2 wheeled Level of assistance:  Modified independence Comments: ambulates with Lt LE straight                                                                                                                                 TREATMENT DATE:  08/02/2024: Recumbent bike level 2 x4 min with PT present to discuss status 2nd step: knee flexion 10x 2nd step: extension mobilization with foot on plate weight for ankle dorsiflexion 5x;  therapist adding overpressure with stretch out strap 30 sec 3x Rocker board 2 min WB on left: forward and back over hurdles 8x (challenging) 8 inch step up with single arm support 10x TKE blue heavy loop 5 sec hold 10x Supine with Les on green ball for flexion 10x; HS sets on ball 5x Knee ROM  hurdles agility 2 laps each: high step walking (challenging); side step Walking backwards pretending to step over imaginary hurdles  07/31/2024: Recumbent bike level 2 x4 min with PT present to discuss status 2nd  step: extension mobilization with foot on plate weight for ankle dorsiflexion 5x;  therapist adding overpressure with stretch out strap 30 sec 3x 4 inch lateral step down 3 points (challenging) 10x 6 inch step up with follow through 10x Single leg Romanian dead lift with red power cord anchored under left foot 15x Leg press seat 7: pt adding overpressure for extension  90# bil 10x; left only 40# 15x (attempted 45# but too difficult) Hip matrix 40# 10x each: flexion, extension, abduction right/left Proprioceptive challenge: WB on left with hip 3 ways minimal UE support needed Ladder agility 2 laps each: high step walking (challenging); side step in squat position; in/out of each box  07/29/2024: NuStep level 5x 7 min with PT present to discuss status Leg press seat 7: pt adding overpressure for extension 90# bil 2x10; left only 40# 2x10 Rockerboard: PF/DF and HS for tissue lengthening x 3 min  Standing hamstring stretch on power plate 6k69 seconds with overpressure  6 inch step ups with right with  min UE support 2x10 6 inch lateral step up with right hip abduction 2x10  4 inch step down heel touch forward and toe touch back 10x (challenging) Proprioceptive challenge: WB on left with circle touches with right toe (moderate speed-random pattern) minimal UE support needed   07/24/2024: Recumbent bike level 2 x4 min with PT present to discuss status Leg press seat 7: pt adding overpressure for extension 75# bil 2x10 85# 10x, 90# bil 10x; left only 40# 15x  Rockerboard: PF/DF and HS for tissue lengthening Manual: extension mobs in supine grade 3 10 sec holds 10x; supine HS with overpressure 5x 30 sec holds; knee flexion with overpressure 15x 6 inch step ups with min UE support 2x10 on Lt 6 inch lateral step up with right hip abduction 2x10 4 inch step down heel touch forward and toe touch back 10x (challenging) Proprioceptive challenge: WB on left with circle touches with right toe (moderate speed-random pattern) minimal UE support needed   PATIENT EDUCATION:  Education details: Educated patient on anatomy and physiology of current symptoms, prognosis, plan of care as well as initial self care strategies to promote recovery Person educated: Patient Education method: Explanation Education comprehension: verbalized understanding  HOME EXERCISE PROGRAM:  Access Code: [email or web address] URL: [email or web address] Date: 07/10/2024 Prepared by: Glade Pesa  Exercises - Supine Quad Set  - 1 x daily - 7 x weekly - 2 sets - 10 reps - 3 sec hold - Supine Hip Abduction  - 1 x daily - 7 x weekly - 2 sets - 10 reps - Supine Knee Extension Stretch on Towel Roll  - 1 x daily - 7 x weekly - 3 sets - 10 reps - Seated Heel Slide  - 1 x daily - 7 x weekly - 3 sets - 10 reps - Supine Heel Slide with Strap  - 1 x daily - 7 x weekly - 2 sets - 10 reps - Small Range Straight Leg Raise  - 1 x daily - 7 x weekly - 2 sets - 10 reps - Sit to Stand with Armchair  - 1 x daily - 7 x weekly - 2 sets -  5 reps - Standing March with Counter Support  - 1 x daily - 7 x weekly - 1-2 sets - 10 reps - Standing Hip Abduction with Counter Support  - 1 x daily - 7 x weekly - 1-2 sets - 10 reps - Heel Raises with Counter Support  - 1 x  daily - 7 x weekly - 1-2 sets - 10 reps - Standing Hip Extension with Counter Support  - 1 x daily - 7 x weekly - 1-2 sets - 10 reps - Standing Knee Flexion with Counter Support  - 1 x daily - 7 x weekly - 1-2 sets - 10 reps - Standing Knee Flexion Stretch on Step  - 1 x daily - 7 x weekly - 1 sets - 10 reps - Standing Hamstring Stretch with Step  - 1 x daily - 7 x weekly - 1 sets - 10 reps - Gastroc Stretch with Foot at Wall  - 1 x daily - 7 x weekly - 1 sets - 10 reps - 30 hold - Forward Step Up (Mirrored)  - 1 x daily - 7 x weekly - 1 sets - 10 reps - Seated Passive Knee Extension  - 1 x daily - 7 x weekly - 1 sets - 3 reps - 60 hold  ASSESSMENT:  CLINICAL IMPRESSION: Improved ROM indicates progress toward stair climbing goal.  Patient demonstrated good quad activation during step ups suggesting improved control.  Will continue with quad strengthening to address reduced eccentric control needed for descending steps and curbs. Treatment continues to emphasize terminal knee extension.   The patient would benefit from a continuation of skilled PT including aquatic PT for a further progression of strengthening and functional mobility.  Will continue to update and promote independence in a HEP needed for a return to the highest functional level possible with ADLs.           OBJECTIVE IMPAIRMENTS: decreased activity tolerance, decreased balance, decreased mobility, difficulty walking, decreased ROM, decreased strength, increased edema, impaired perceived functional ability, and pain.   ACTIVITY LIMITATIONS: bending, standing, squatting, stairs, transfers, bathing, toileting, dressing, hygiene/grooming, and locomotion level  PARTICIPATION LIMITATIONS: meal prep,  cleaning, laundry, driving, shopping, and community activity  PERSONAL FACTORS: 1-2 comorbidities: HTN are also affecting patient's functional outcome.   REHAB POTENTIAL: Good  CLINICAL DECISION MAKING: Stable/uncomplicated  EVALUATION COMPLEXITY: Low   GOALS: Goals reviewed with patient? Yes  SHORT TERM GOALS: Target date: 07/05/2024   The patient will demonstrate knowledge of basic self care strategies and exercises to promote healing  Baseline: Goal status: met 11/21  2.  Pt will be able to perform sit to stand with equal WB through bil LE with UE assist Baseline:  Goal status: met 12/3  3.  Pt will improve Lt knee A/ROM measured in supine to -4 to 90 deg Baseline:  Goal status: Ongoing (10 to 110 on 07/19/24)  4.  Pt will demo symmetrical gait with walker  Baseline:  Goal status: met 11/21    LONG TERM GOALS: Target date: 09/27/2024   The patient will be independent in a safe self progression of a home exercise program to promote further recovery of function  Baseline:  Goal status: Ongoing  2.  The patient will report a 60% improvement in pain levels with functional activities which are currently difficult including transfers, stairs, and bed mobility Baseline:  Goal status: Met on 07/19/24 (reports 80% improvement)  3.  The patient will have improved LE strength of at least 4+/5 needed to ascend and descend steps reciprocally  Baseline:  Goal status: Ongoing  4.  Pt will be able to perform household ambulation and short distance community ambulation with LRAD. Baseline:  Goal status: met 12/24  5. Establish an aquatic ex program appropriate for TKR NEW  6. Knee extension ROM  improved to 3 degrees for normalized gait pattern as pt enjoys walking for leisure NEW  7.  Knee flexion ROM improved to 125 degrees needed for stepping up on large steps (transportation shuttles etc) and curbs NEW       PLAN:  PT FREQUENCY: 2x/week  PT DURATION: 8  weeks  PLANNED INTERVENTIONS: 97164- PT Re-evaluation, 97750- Physical Performance Testing, 97110-Therapeutic exercises, 97530- Therapeutic activity, 97112- Neuromuscular re-education, 97535- Self Care, [id number]- Manual therapy, [phone]- Aquatic Therapy, [id number]- Electrical stimulation (unattended), [phone]- Electrical stimulation (manual), 97016- Vasopneumatic device, Patient/Family education, Balance training, Stair training, Taping, Joint mobilization, Cryotherapy, and Moist heat  PLAN FOR NEXT SESSION:  will do 2-3 aquatic PT appts in January to establish HEP;  NuStep, gait training, knee ROM flexion and extension empasis; LE strengthening, manual techniques    Glade Pesa, PT 08/02/2024 11:47 AM Phone: [phone] Fax: [phone]  [facility] [address] [place], [place] [address] Phone # [phone] Fax [phone]   "

## 2024-08-07 ENCOUNTER — Ambulatory Visit: Admitting: Physical Therapy

## 2024-08-07 ENCOUNTER — Encounter: Payer: Self-pay | Admitting: Physical Therapy

## 2024-08-07 DIAGNOSIS — M25562 Pain in left knee: Secondary | ICD-10-CM

## 2024-08-07 DIAGNOSIS — M6281 Muscle weakness (generalized): Secondary | ICD-10-CM

## 2024-08-07 DIAGNOSIS — R600 Localized edema: Secondary | ICD-10-CM

## 2024-08-07 NOTE — Therapy (Signed)
 " OUTPATIENT PHYSICAL THERAPY TREATMENT NOTE   Patient Name: Mary Graves MRN: [id number] DOB:[birth year], 79 y.o., female Today's Date: 08/07/2024      END OF SESSION:  PT End of Session - 08/07/24 1106     Visit Number 19    Date for Recertification  09/27/24    Authorization Type [employer]    Progress Note Due on Visit 20    PT Start Time 1100    PT Stop Time 1140    PT Time Calculation (min) 40 min    Activity Tolerance Patient tolerated treatment well           Past Medical History:  Diagnosis Date   Alcoholism (HCC)    Cancer of uvula (HCC)    2021   Heart murmur    Hypertension    Hypothyroidism    Past Surgical History:  Procedure Laterality Date   COLONOSCOPY     KNEE SURGERY Left    x 2   TONSILLECTOMY     TOTAL KNEE ARTHROPLASTY Left 06/04/2024   Procedure: ARTHROPLASTY, KNEE, TOTAL;  Surgeon: Ernie Cough, MD;  Location: [facility];  Service: Orthopedics;  Laterality: Left;   Patient Active Problem List   Diagnosis Date Noted   S/P total knee arthroplasty, left 06/04/2024   Elevated troponin 07/11/2014   Syncope 07/11/2014   PTSD (post-traumatic stress disorder) 10/31/2013   Alcoholism in family 10/31/2013   Dementia (HCC) 10/09/2013   HLD (hyperlipidemia) 02/27/2013   Osteopenia 02/27/2013   Alcohol  dependence (HCC) 06/01/2012   Major depression 06/01/2012   Hypothyroidism 02/23/2009   Essential hypertension 02/23/2009   HYPERGLYCEMIA 02/23/2009    PCP: Aisha Harvey MD  REFERRING PROVIDER: Ernie Cough MD  REFERRING DIAG: [phone] presence of left artificial knee joint  THERAPY DIAG:  Left knee pain; left knee stiffness Rationale for Evaluation and Treatment: Rehabilitation  ONSET DATE: surgery 06/04/24  SUBJECTIVE:   SUBJECTIVE STATEMENT: My co-pay went up.  Went to the gym twice this week.  I'm a little sore.    PERTINENT HISTORY: Hx of Lt TKR 06/04/24 HTN; osteopenia; depression  Has [employer] membership  PAIN:    Are you having pain? Yes NPRS scale:soreness only no pain 0/10 Pain location: left knee PAIN TYPE: aching Aggravating factors: certain movements/activity Relieving factors: cold pack   PRECAUTIONS: None     WEIGHT BEARING RESTRICTIONS: No  FALLS:  Has patient fallen in last 6 months? No  LIVING ENVIRONMENT: Lives with: lives alone but friend living with her now to help Lives in: House/apartment Stairs: Yes: External: 4 steps; on right going up Has following equipment at home: Single point cane and Environmental Consultant - 2 wheeled  OCCUPATION: retired but active doing service work Biochemist, [employer])  PLOF: Independent and Leisure: trail walking (3 miles)  PATIENT GOALS: be able to travel on an airplane by [employer]  NEXT MD VISIT: 06/20/24  OBJECTIVE:  Note: Objective measures were completed at Evaluation unless otherwise noted.  DIAGNOSTIC FINDINGS: OA left knee  PATIENT SURVEYS:  The Patient-Specific Functional Scale  Initial:  I am going to ask you to identify up to 3 important activities that you are unable to do or are having difficulty with as a result of this problem.  Today are there any activities that you are unable to do or having difficulty with because of this?  (Patient shown scale and patient rated each activity)  Follow up: When you first came in you had difficulty performing these activities.  Today do  you still have difficulty?  Patient-Specific activity scoring scheme (Point to one number):  0 1 2 3 4 5 6 7 8 9  10 Unable                                                                                                          Able to perform To perform                                                                                                    activity at the same Activity         Level as before                                                                                                                       Injury or problem  Activity  Get in/out  of bed  Initial:     2                  follow up: 12/12 8 in bed, 5 out of bed    08/02/24: 9 2.  Get in/out of chair  Initial:      2                 follow up:  12/12 7                                      08/02/24:   8  3.  Walk to bathroom  Initial:      2                 follow up: 12/12   7                                     08/02/24:   9     COGNITION: Overall cognitive status: Within functional limits for tasks assessed     SENSATION: Anterior thigh numbness  EDEMA:  Eval:  Circumferential: Rt  35cm, Lt 40.5cm with ACE bandage   LOWER EXTREMITY ROM:  Active ROM Right eval Left eval Left 06/17/24 11/21 Left 06/24/24 12/3 12/12 1/2 1/7  Hip flexion           Hip extension           Hip abduction           Hip adduction           Hip internal rotation           Hip external rotation           Knee flexion  30 A/ROM 55 P/ROM - limited by pain in quad 95  106 109 seated 114 supine  121 Supine, seated 120 123 seated  Knee extension  -12 -12 -10 -8 -7.5 supine; -15 seated   Supine and  -6 seated Supine -4.5,  Seated -6 Seated -4.5  Ankle dorsiflexion           Ankle plantarflexion           Ankle inversion           Ankle eversion            (Blank rows = not tested)  LOWER EXTREMITY MMT: Significant weakness of Lt hip and knee - mild quad set activation on Lt Unable to move Lt leg without external support unless in anti-gravity position Lt hip 2+/5 Lt knee 2+/5   FUNCTIONAL TESTS:  Eval: Sit to stand: heavy use of bil UE on armrests, using primarily Rt LE and kicks Lt LE out Transfers: needs external support or max A to transfer Lt LE into bed/onto table  06/19/2024: Timed up and Go (TUG):  21.96 sec with SPC 3 minute walk test: 358 ft with SPC  12/12;   Timed up and God (TUG):  7.90 no device, light use of Ues 3 minute walk test:  654 ft with no device  GAIT: Distance walked:  Assistive device utilized: Environmental Consultant - 2 wheeled Level of assistance: Modified  independence Comments: ambulates with Lt LE straight                                                                                                                                 TREATMENT DATE:  08/07/2024: Recumbent bike level 2 x4 min with PT present to discuss status 2nd step: knee flexion stretch 10x 2nd step: extension stretch 10x 6 inch step ups no UE support 10x 4 inch step downs single UE support 10x (challenging) WB on left with UE reaches downward 11:00, 12:00, 2:00 5x 4 inch lateral step ups on left with right hip abduction 10x light UE support Squatting holding sink basin for deeper squat 5x  Knee ROM as above Resisted cable walk with belt 15# emphasizing push off with left 8x Proprioception/balance: foam balance beam forwards and backwards; forward/backwards on beam with 3 hurdles   08/02/2024: Recumbent bike level 2 x4  min with PT present to discuss status 2nd step: knee flexion 10x 2nd step: extension mobilization with foot on plate weight for ankle dorsiflexion 5x;  therapist adding overpressure with stretch out strap 30 sec 3x Rocker board 2 min WB on left: forward and back over hurdles 8x (challenging) 8 inch step up with single arm support 10x TKE blue heavy loop 5 sec hold 10x Supine with Les on green ball for flexion 10x; HS sets on ball 5x Knee ROM  hurdles agility 2 laps each: high step walking (challenging); side step Walking backwards pretending to step over imaginary hurdles  07/31/2024: Recumbent bike level 2 x4 min with PT present to discuss status 2nd step: extension mobilization with foot on plate weight for ankle dorsiflexion 5x;  therapist adding overpressure with stretch out strap 30 sec 3x 4 inch lateral step down 3 points (challenging) 10x 6 inch step up with follow through 10x Single leg Romanian dead lift with red power cord anchored under left foot 15x Leg press seat 7: pt adding overpressure for extension  90# bil 10x; left only 40# 15x  (attempted 45# but too difficult) Hip matrix 40# 10x each: flexion, extension, abduction right/left Proprioceptive challenge: WB on left with hip 3 ways minimal UE support needed Ladder agility 2 laps each: high step walking (challenging); side step in squat position; in/out of each box  07/29/2024: NuStep level 5x 7 min with PT present to discuss status Leg press seat 7: pt adding overpressure for extension 90# bil 2x10; left only 40# 2x10 Rockerboard: PF/DF and HS for tissue lengthening x 3 min  Standing hamstring stretch on power plate 6k69 seconds with overpressure  6 inch step ups with right with min UE support 2x10 6 inch lateral step up with right hip abduction 2x10  4 inch step down heel touch forward and toe touch back 10x (challenging) Proprioceptive challenge: WB on left with circle touches with right toe (moderate speed-random pattern) minimal UE support needed   07/24/2024: Recumbent bike level 2 x4 min with PT present to discuss status Leg press seat 7: pt adding overpressure for extension 75# bil 2x10 85# 10x, 90# bil 10x; left only 40# 15x  Rockerboard: PF/DF and HS for tissue lengthening Manual: extension mobs in supine grade 3 10 sec holds 10x; supine HS with overpressure 5x 30 sec holds; knee flexion with overpressure 15x 6 inch step ups with min UE support 2x10 on Lt 6 inch lateral step up with right hip abduction 2x10 4 inch step down heel touch forward and toe touch back 10x (challenging) Proprioceptive challenge: WB on left with circle touches with right toe (moderate speed-random pattern) minimal UE support needed   PATIENT EDUCATION:  Education details: Educated patient on anatomy and physiology of current symptoms, prognosis, plan of care as well as initial self care strategies to promote recovery Person educated: Patient Education method: Explanation Education comprehension: verbalized understanding  HOME EXERCISE PROGRAM:  Access Code: [email or web address] URL:  [email or web address] Date: 07/10/2024 Prepared by: Glade Pesa  Exercises - Supine Quad Set  - 1 x daily - 7 x weekly - 2 sets - 10 reps - 3 sec hold - Supine Hip Abduction  - 1 x daily - 7 x weekly - 2 sets - 10 reps - Supine Knee Extension Stretch on Towel Roll  - 1 x daily - 7 x weekly - 3 sets - 10 reps - Seated Heel Slide  - 1 x daily - 7 x weekly - 3 sets -  10 reps - Supine Heel Slide with Strap  - 1 x daily - 7 x weekly - 2 sets - 10 reps - Small Range Straight Leg Raise  - 1 x daily - 7 x weekly - 2 sets - 10 reps - Sit to Stand with Armchair  - 1 x daily - 7 x weekly - 2 sets - 5 reps - Standing March with Counter Support  - 1 x daily - 7 x weekly - 1-2 sets - 10 reps - Standing Hip Abduction with Counter Support  - 1 x daily - 7 x weekly - 1-2 sets - 10 reps - Heel Raises with Counter Support  - 1 x daily - 7 x weekly - 1-2 sets - 10 reps - Standing Hip Extension with Counter Support  - 1 x daily - 7 x weekly - 1-2 sets - 10 reps - Standing Knee Flexion with Counter Support  - 1 x daily - 7 x weekly - 1-2 sets - 10 reps - Standing Knee Flexion Stretch on Step  - 1 x daily - 7 x weekly - 1 sets - 10 reps - Standing Hamstring Stretch with Step  - 1 x daily - 7 x weekly - 1 sets - 10 reps - Gastroc Stretch with Foot at Wall  - 1 x daily - 7 x weekly - 1 sets - 10 reps - 30 hold - Forward Step Up (Mirrored)  - 1 x daily - 7 x weekly - 1 sets - 10 reps - Seated Passive Knee Extension  - 1 x daily - 7 x weekly - 1 sets - 3 reps - 60 hold  ASSESSMENT:  CLINICAL IMPRESSION: Treatment focus on eccentric quad strengthening needed for descending curbs and steps.  This continues to be challenging with quick quad muscle fatigue. Her ROM continues to steadily improve and she is highly compliant with stretching strategies at home for both flexion and extension.  Decreased proprioception with single leg balance on surgical side continues.  She would benefit from establishment of  an aquatic ex program and anticipate readiness for discharge from PT in the next 4-6 visits.          OBJECTIVE IMPAIRMENTS: decreased activity tolerance, decreased balance, decreased mobility, difficulty walking, decreased ROM, decreased strength, increased edema, impaired perceived functional ability, and pain.   ACTIVITY LIMITATIONS: bending, standing, squatting, stairs, transfers, bathing, toileting, dressing, hygiene/grooming, and locomotion level  PARTICIPATION LIMITATIONS: meal prep, cleaning, laundry, driving, shopping, and community activity  PERSONAL FACTORS: 1-2 comorbidities: HTN are also affecting patient's functional outcome.   REHAB POTENTIAL: Good  CLINICAL DECISION MAKING: Stable/uncomplicated  EVALUATION COMPLEXITY: Low   GOALS: Goals reviewed with patient? Yes  SHORT TERM GOALS: Target date: 07/05/2024   The patient will demonstrate knowledge of basic self care strategies and exercises to promote healing  Baseline: Goal status: met 11/21  2.  Pt will be able to perform sit to stand with equal WB through bil LE with UE assist Baseline:  Goal status: met 12/3  3.  Pt will improve Lt knee A/ROM measured in supine to -4 to 90 deg Baseline:  Goal status: met 1/7  4.  Pt will demo symmetrical gait with walker  Baseline:  Goal status: met 11/21    LONG TERM GOALS: Target date: 09/27/2024   The patient will be independent in a safe self progression of a home exercise program to promote further recovery of function  Baseline:  Goal status: Ongoing  2.  The patient will report a 60% improvement in pain levels with functional activities which are currently difficult including transfers, stairs, and bed mobility Baseline:  Goal status: Met on 07/19/24 (reports 80% improvement)  3.  The patient will have improved LE strength of at least 4+/5 needed to ascend and descend steps reciprocally  Baseline:  Goal status: Ongoing  4.  Pt will be able to perform  household ambulation and short distance community ambulation with LRAD. Baseline:  Goal status: met 12/24  5. Establish an aquatic ex program appropriate for TKR NEW  6. Knee extension ROM improved to 3 degrees for normalized gait pattern as pt enjoys walking for leisure NEW  7.  Knee flexion ROM improved to 125 degrees needed for stepping up on large steps (transportation shuttles etc) and curbs NEW       PLAN:  PT FREQUENCY: 2x/week  PT DURATION: 8 weeks  PLANNED INTERVENTIONS: 97164- PT Re-evaluation, 97750- Physical Performance Testing, 97110-Therapeutic exercises, 97530- Therapeutic activity, 97112- Neuromuscular re-education, 97535- Self Care, [id number]- Manual therapy, [phone]- Aquatic Therapy, [id number]- Electrical stimulation (unattended), [phone]- Electrical stimulation (manual), 97016- Vasopneumatic device, Patient/Family education, Balance training, Stair training, Taping, Joint mobilization, Cryotherapy, and Moist heat  PLAN FOR NEXT SESSION: 20th visit prog note;  will do 2-3 aquatic PT appts in January to establish ex program;  NuStep, gait training, knee ROM flexion and extension empasis; LE strengthening, manual techniques    Glade Pesa, PT 08/07/2024 4:57 PM Phone: [phone] Fax: [phone]  [facility] [address], [address] [place], [place] [address] Phone # [phone] Fax [phone]   "

## 2024-08-12 ENCOUNTER — Ambulatory Visit

## 2024-08-12 DIAGNOSIS — M6281 Muscle weakness (generalized): Secondary | ICD-10-CM

## 2024-08-12 DIAGNOSIS — M25562 Pain in left knee: Secondary | ICD-10-CM | POA: Diagnosis not present

## 2024-08-12 DIAGNOSIS — R2689 Other abnormalities of gait and mobility: Secondary | ICD-10-CM

## 2024-08-12 DIAGNOSIS — R600 Localized edema: Secondary | ICD-10-CM

## 2024-08-12 NOTE — Therapy (Signed)
 " OUTPATIENT PHYSICAL THERAPY TREATMENT NOTE   Patient Name: Mary Graves MRN: [id number] DOB:06/10/1946, 79 y.o., female Today's Date: 08/12/2024   Progress Note Reporting Period 07/15/24 to 08/12/24  See note below for Objective Data and Assessment of Progress/Goals.       END OF SESSION:  PT End of Session - 08/12/24 1142     Visit Number 20    Date for Recertification  09/27/24    Authorization Type [employer]    Progress Note Due on Visit 30    PT Start Time 1100    PT Stop Time 1143    PT Time Calculation (min) 43 min    Activity Tolerance Patient tolerated treatment well    Behavior During Therapy WFL for tasks assessed/performed            Past Medical History:  Diagnosis Date   Alcoholism (HCC)    Cancer of uvula (HCC)    2021   Heart murmur    Hypertension    Hypothyroidism    Past Surgical History:  Procedure Laterality Date   COLONOSCOPY     KNEE SURGERY Left    x 2   TONSILLECTOMY     TOTAL KNEE ARTHROPLASTY Left 06/04/2024   Procedure: ARTHROPLASTY, KNEE, TOTAL;  Surgeon: Ernie Cough, MD;  Location: [facility];  Service: Orthopedics;  Laterality: Left;   Patient Active Problem List   Diagnosis Date Noted   S/P total knee arthroplasty, left 06/04/2024   Elevated troponin 07/11/2014   Syncope 07/11/2014   PTSD (post-traumatic stress disorder) 10/31/2013   Alcoholism in family 10/31/2013   Dementia (HCC) 10/09/2013   HLD (hyperlipidemia) 02/27/2013   Osteopenia 02/27/2013   Alcohol  dependence (HCC) 06/01/2012   Major depression 06/01/2012   Hypothyroidism 02/23/2009   Essential hypertension 02/23/2009   HYPERGLYCEMIA 02/23/2009    PCP: Aisha Harvey MD  REFERRING PROVIDER: Ernie Cough MD  REFERRING DIAG: [phone] presence of left artificial knee joint  THERAPY DIAG:  Left knee pain; left knee stiffness Rationale for Evaluation and Treatment: Rehabilitation  ONSET DATE: surgery 06/04/24  SUBJECTIVE:   SUBJECTIVE  STATEMENT: I've been going to [facility] on opposite days.  I've had some increased pain and I am not sure why. I stopped some of the exercises just to be safe.     PERTINENT HISTORY: Hx of Lt TKR 06/04/24 HTN; osteopenia; depression  Has [facility] membership  PAIN: 08/12/24  Are you having pain? Yes NPRS scale:soreness only no pain 0/10 Pain location: left knee PAIN TYPE: aching Aggravating factors: certain movements/activity Relieving factors: cold pack   PRECAUTIONS: None     WEIGHT BEARING RESTRICTIONS: No  FALLS:  Has patient fallen in last 6 months? No  LIVING ENVIRONMENT: Lives with: lives alone but friend living with her now to help Lives in: House/apartment Stairs: Yes: External: 4 steps; on right going up Has following equipment at home: Single point cane and Environmental Consultant - 2 wheeled  OCCUPATION: retired but active doing service work Biochemist, [employer])  PLOF: Independent and Leisure: trail walking (3 miles)  PATIENT GOALS: be able to travel on an airplane by [employer]  NEXT MD VISIT: 06/20/24  OBJECTIVE:  Note: Objective measures were completed at Evaluation unless otherwise noted.  DIAGNOSTIC FINDINGS: OA left knee  PATIENT SURVEYS:  The Patient-Specific Functional Scale  Initial:  I am going to ask you to identify up to 3 important activities that you are unable to do or are having difficulty with as a result of this  problem.  Today are there any activities that you are unable to do or having difficulty with because of this?  (Patient shown scale and patient rated each activity)  Follow up: When you first came in you had difficulty performing these activities.  Today do you still have difficulty?  Patient-Specific activity scoring scheme (Point to one number):  0 1 2 3 4 5 6 7 8 9  10 Unable                                                                                                          Able to perform To perform                                                                                                     activity at the same Activity         Level as before                                                                                                                       Injury or problem  Activity  Get in/out of bed  Initial:     2                  follow up: 12/12 8 in bed, 5 out of bed    08/02/24: 9 2.  Get in/out of chair  Initial:      2                 follow up:  12/12 7                                      08/02/24:   8  3.  Walk to bathroom  Initial:      2                 follow up: 12/12   7  08/02/24:   9     COGNITION: Overall cognitive status: Within functional limits for tasks assessed     SENSATION: Anterior thigh numbness  EDEMA:  Eval:  Circumferential: Rt 35cm, Lt 40.5cm with ACE bandage   LOWER EXTREMITY ROM:  Active ROM Right eval Left eval Left 06/17/24 11/21 Left 06/24/24 12/3 12/12 1/2 1/7  Hip flexion           Hip extension           Hip abduction           Hip adduction           Hip internal rotation           Hip external rotation           Knee flexion  30 A/ROM 55 P/ROM - limited by pain in quad 95  106 109 seated 114 supine  121 Supine, seated 120 123 seated  Knee extension  -12 -12 -10 -8 -7.5 supine; -15 seated   Supine and  -6 seated Supine -4.5,  Seated -6 Seated -4.5  Ankle dorsiflexion           Ankle plantarflexion           Ankle inversion           Ankle eversion            (Blank rows = not tested)  LOWER EXTREMITY MMT: Significant weakness of Lt hip and knee - mild quad set activation on Lt Unable to move Lt leg without external support unless in anti-gravity position Lt hip 2+/5 Lt knee 2+/5   FUNCTIONAL TESTS:  Eval: Sit to stand: heavy use of bil UE on armrests, using primarily Rt LE and kicks Lt LE out Transfers: needs external support or max A to transfer Lt LE into bed/onto table  06/19/2024: Timed up and Go  (TUG):  21.96 sec with SPC 3 minute walk test: 358 ft with SPC  12/12;   Timed up and God (TUG):  7.90 no device, light use of Ues 3 minute walk test:  654 ft with no device  GAIT: Distance walked:  Assistive device utilized: Environmental Consultant - 2 wheeled Level of assistance: Modified independence Comments: ambulates with Lt LE straight                                                                                                                                 TREATMENT DATE:  08/12/2024: Recumbent bike level 2 x6 min with PT present to discuss status 2nd step: knee flexion stretch 10x- hold 10 seconds  2nd step: extension stretch 5x  6 inch step ups no UE support 2x10 6 inch step downs single UE support x15  6 inch lateral step ups on left with right hip abduction 10x light UE support Squatting holding sink basin for deeper squat 5x  Gastroc stretch on slant board at wall 3x 20 seconds  Resisted cable walk with belt 15# emphasizing push off with left 8x Proprioception/balance: step up on balance bad with stabilization x20 on Lt   08/07/2024: Recumbent bike level 2 x4 min with PT present to discuss status 2nd step: knee flexion stretch 10x 2nd step: extension stretch 10x 6 inch step ups no UE support 10x 4 inch step downs single UE support 10x (challenging) WB on left with UE reaches downward 11:00, 12:00, 2:00 5x 4 inch lateral step ups on left with right hip abduction 10x light UE support Squatting holding sink basin for deeper squat 5x  Knee ROM as above Resisted cable walk with belt 15# emphasizing push off with left 8x Proprioception/balance: foam balance beam forwards and backwards; forward/backwards on beam with 3 hurdles   08/02/2024: Recumbent bike level 2 x4 min with PT present to discuss status 2nd step: knee flexion 10x 2nd step: extension mobilization with foot on plate weight for ankle dorsiflexion 5x;  therapist adding overpressure with stretch out strap 30 sec  3x Rocker board 2 min WB on left: forward and back over hurdles 8x (challenging) 8 inch step up with single arm support 10x TKE blue heavy loop 5 sec hold 10x Supine with Les on green ball for flexion 10x; HS sets on ball 5x Knee ROM  hurdles agility 2 laps each: high step walking (challenging); side step Walking backwards pretending to step over imaginary hurdles   PATIENT EDUCATION:  Education details: Educated patient on anatomy and physiology of current symptoms, prognosis, plan of care as well as initial self care strategies to promote recovery Person educated: Patient Education method: Explanation Education comprehension: verbalized understanding  HOME EXERCISE PROGRAM:  Access Code: [email or web address] URL: [email or web address] Date: 07/10/2024 Prepared by: Glade Pesa  Exercises - Supine Quad Set  - 1 x daily - 7 x weekly - 2 sets - 10 reps - 3 sec hold - Supine Hip Abduction  - 1 x daily - 7 x weekly - 2 sets - 10 reps - Supine Knee Extension Stretch on Towel Roll  - 1 x daily - 7 x weekly - 3 sets - 10 reps - Seated Heel Slide  - 1 x daily - 7 x weekly - 3 sets - 10 reps - Supine Heel Slide with Strap  - 1 x daily - 7 x weekly - 2 sets - 10 reps - Small Range Straight Leg Raise  - 1 x daily - 7 x weekly - 2 sets - 10 reps - Sit to Stand with Armchair  - 1 x daily - 7 x weekly - 2 sets - 5 reps - Standing March with Counter Support  - 1 x daily - 7 x weekly - 1-2 sets - 10 reps - Standing Hip Abduction with Counter Support  - 1 x daily - 7 x weekly - 1-2 sets - 10 reps - Heel Raises with Counter Support  - 1 x daily - 7 x weekly - 1-2 sets - 10 reps - Standing Hip Extension with Counter Support  - 1 x daily - 7 x weekly - 1-2 sets - 10 reps - Standing Knee Flexion with Counter Support  - 1 x daily - 7 x weekly - 1-2 sets - 10 reps - Standing Knee Flexion Stretch on Step  - 1 x daily - 7 x weekly - 1 sets - 10 reps - Standing Hamstring Stretch with Step  - 1 x  daily - 7 x weekly - 1 sets - 10 reps -  Gastroc Stretch with Foot at Wall  - 1 x daily - 7 x weekly - 1 sets - 10 reps - 30 hold - Forward Step Up (Mirrored)  - 1 x daily - 7 x weekly - 1 sets - 10 reps - Seated Passive Knee Extension  - 1 x daily - 7 x weekly - 1 sets - 3 reps - 60 hold  ASSESSMENT:  CLINICAL IMPRESSION: Pt is making steady progress s/p Lt TKA.  Pt arrived with concern regarding increased pain with increased activity this past week.  PT educated pt that this is normal with increased activity.  No signs of inflammation today.   Pt continues to work on eccentric control of Lt quad and was able to do step down on 6 step today.   Her ROM continues to steadily improve and she is highly compliant with stretching strategies at home for both flexion and extension. Pt will have aquatics visit later this week to learn independence with this so she can exercise at [facility].   She would benefit from establishment of an aquatic ex program and anticipate readiness for discharge from PT in the next 3-5 visits.      OBJECTIVE IMPAIRMENTS: decreased activity tolerance, decreased balance, decreased mobility, difficulty walking, decreased ROM, decreased strength, increased edema, impaired perceived functional ability, and pain.   ACTIVITY LIMITATIONS: bending, standing, squatting, stairs, transfers, bathing, toileting, dressing, hygiene/grooming, and locomotion level  PARTICIPATION LIMITATIONS: meal prep, cleaning, laundry, driving, shopping, and community activity  PERSONAL FACTORS: 1-2 comorbidities: HTN are also affecting patient's functional outcome.   REHAB POTENTIAL: Good  CLINICAL DECISION MAKING: Stable/uncomplicated  EVALUATION COMPLEXITY: Low   GOALS: Goals reviewed with patient? Yes  SHORT TERM GOALS: Target date: 07/05/2024   The patient will demonstrate knowledge of basic self care strategies and exercises to promote healing  Baseline: Goal status: met 11/21  2.  Pt  will be able to perform sit to stand with equal WB through bil LE with UE assist Baseline:  Goal status: met 12/3  3.  Pt will improve Lt knee A/ROM measured in supine to -4 to 90 deg Baseline:  Goal status: met 1/7  4.  Pt will demo symmetrical gait with walker  Baseline:  Goal status: met 11/21    LONG TERM GOALS: Target date: 09/27/2024   The patient will be independent in a safe self progression of a home exercise program to promote further recovery of function  Baseline:  Goal status: Ongoing  2.  The patient will report a 60% improvement in pain levels with functional activities which are currently difficult including transfers, stairs, and bed mobility Baseline:  Goal status: Met on 07/19/24 (reports 80% improvement)  3.  The patient will have improved LE strength of at least 4+/5 needed to ascend and descend steps reciprocally  Baseline:  Goal status: Ongoing  4.  Pt will be able to perform household ambulation and short distance community ambulation with LRAD. Baseline:  Goal status: met 12/24  5. Establish an aquatic ex program appropriate for TKR  Has appt this week (08/12/24) NEW  6. Knee extension ROM improved to 3 degrees for normalized gait pattern as pt enjoys walking for leisure  -5 degrees (08/12/24) NEW  7.  Knee flexion ROM improved to 125 degrees needed for stepping up on large steps (transportation shuttles etc) and curbs NEW       PLAN:  PT FREQUENCY: 2x/week  PT DURATION: 8 weeks  PLANNED INTERVENTIONS: 97164- PT Re-evaluation, 97750-  Physical Performance Testing, 97110-Therapeutic exercises, 97530- Therapeutic activity, [id number]- Neuromuscular re-education, [phone]- Self Care, [id number]- Manual therapy, [phone]- Aquatic Therapy, [phone]- Electrical stimulation (unattended), [phone]- Electrical stimulation (manual), 97016- Vasopneumatic device, Patient/Family education, Balance training, Stair training, Taping, Joint mobilization, Cryotherapy, and Moist  heat  PLAN FOR NEXT SESSION: will do 2-3 aquatic PT appts in January to establish ex program;  NuStep, gait training, knee ROM flexion and extension empasis; LE strengthening, manual techniques   Burnard Joy, PT 08/12/2024 11:43 AM   [facility] [address] [place], [place] [address] Phone # [phone] Fax [phone]   "

## 2024-08-14 ENCOUNTER — Ambulatory Visit: Admitting: Physical Therapy

## 2024-08-14 ENCOUNTER — Encounter: Payer: Self-pay | Admitting: Physical Therapy

## 2024-08-14 DIAGNOSIS — M25562 Pain in left knee: Secondary | ICD-10-CM | POA: Diagnosis not present

## 2024-08-14 DIAGNOSIS — R600 Localized edema: Secondary | ICD-10-CM

## 2024-08-14 DIAGNOSIS — R2689 Other abnormalities of gait and mobility: Secondary | ICD-10-CM

## 2024-08-14 DIAGNOSIS — M6281 Muscle weakness (generalized): Secondary | ICD-10-CM

## 2024-08-14 NOTE — Therapy (Signed)
" OUTPATIENT PHYSICAL THERAPY TREATMENT NOTE   Patient Name: Mary Graves MRN: [id number] DOB:1946-05-12, 79 y.o., female Today's Date: 08/14/2024      END OF SESSION:  PT End of Session - 08/14/24 0840     Visit Number 21    Date for Recertification  09/27/24    Authorization Type [employer]    Progress Note Due on Visit 30    PT Start Time 0840    PT Stop Time 0925    PT Time Calculation (min) 45 min    Activity Tolerance Patient tolerated treatment well    Behavior During Therapy [facility] for tasks assessed/performed            Past Medical History:  Diagnosis Date   Alcoholism (HCC)    Cancer of uvula (HCC)    2021   Heart murmur    Hypertension    Hypothyroidism    Past Surgical History:  Procedure Laterality Date   COLONOSCOPY     KNEE SURGERY Left    x 2   TONSILLECTOMY     TOTAL KNEE ARTHROPLASTY Left 06/04/2024   Procedure: ARTHROPLASTY, KNEE, TOTAL;  Surgeon: Ernie Cough, MD;  Location: [facility];  Service: Orthopedics;  Laterality: Left;   Patient Active Problem List   Diagnosis Date Noted   S/P total knee arthroplasty, left 06/04/2024   Elevated troponin 07/11/2014   Syncope 07/11/2014   PTSD (post-traumatic stress disorder) 10/31/2013   Alcoholism in family 10/31/2013   Dementia (HCC) 10/09/2013   HLD (hyperlipidemia) 02/27/2013   Osteopenia 02/27/2013   Alcohol  dependence (HCC) 06/01/2012   Major depression 06/01/2012   Hypothyroidism 02/23/2009   Essential hypertension 02/23/2009   HYPERGLYCEMIA 02/23/2009    PCP: Aisha Harvey MD  REFERRING PROVIDER: Ernie Cough MD  REFERRING DIAG: [id number] presence of left artificial knee joint  THERAPY DIAG:  Left knee pain; left knee stiffness Rationale for Evaluation and Treatment: Rehabilitation  ONSET DATE: surgery 06/04/24  SUBJECTIVE:   SUBJECTIVE STATEMENT: Good day so far. Pt reports she can come to the warm water  pool to practice tomorrow.     PERTINENT HISTORY: Hx of Lt  TKR 06/04/24 HTN; osteopenia; depression  Has [employer] membership  PAIN: 08/12/24  Are you having pain? Yes NPRS scale:soreness only no pain 0/10 Pain location: left knee PAIN TYPE: aching Aggravating factors: certain movements/activity Relieving factors: cold pack   PRECAUTIONS: None     WEIGHT BEARING RESTRICTIONS: No  FALLS:  Has patient fallen in last 6 months? No  LIVING ENVIRONMENT: Lives with: lives alone but friend living with her now to help Lives in: House/apartment Stairs: Yes: External: 4 steps; on right going up Has following equipment at home: Single point cane and Environmental Consultant - 2 wheeled  OCCUPATION: retired but active doing service work Biochemist, [employer])  PLOF: Independent and Leisure: trail walking (3 miles)  PATIENT GOALS: be able to travel on an airplane by [employer]  NEXT MD VISIT: 06/20/24  OBJECTIVE:  Note: Objective measures were completed at Evaluation unless otherwise noted.  DIAGNOSTIC FINDINGS: OA left knee  PATIENT SURVEYS:  The Patient-Specific Functional Scale  Initial:  I am going to ask you to identify up to 3 important activities that you are unable to do or are having difficulty with as a result of this problem.  Today are there any activities that you are unable to do or having difficulty with because of this?  (Patient shown scale and patient rated each activity)  Follow up: When you  first came in you had difficulty performing these activities.  Today do you still have difficulty?  Patient-Specific activity scoring scheme (Point to one number):  0 1 2 3 4 5 6 7 8 9  10 Unable                                                                                                          Able to perform To perform                                                                                                    activity at the same Activity         Level as before                                                                                                                        Injury or problem  Activity  Get in/out of bed  Initial:     2                  follow up: 12/12 8 in bed, 5 out of bed    08/02/24: 9 2.  Get in/out of chair  Initial:      2                 follow up:  12/12 7                                      08/02/24:   8  3.  Walk to bathroom  Initial:      2                 follow up: 12/12   7                                     08/02/24:   9     COGNITION: Overall cognitive status: Within functional limits for tasks assessed  SENSATION: Anterior thigh numbness  EDEMA:  Eval:  Circumferential: Rt 35cm, Lt 40.5cm with ACE bandage   LOWER EXTREMITY ROM:  Active ROM Right eval Left eval Left 06/17/24 11/21 Left 06/24/24 12/3 12/12 1/2 1/7  Hip flexion           Hip extension           Hip abduction           Hip adduction           Hip internal rotation           Hip external rotation           Knee flexion  30 A/ROM 55 P/ROM - limited by pain in quad 95  106 109 seated 114 supine  121 Supine, seated 120 123 seated  Knee extension  -12 -12 -10 -8 -7.5 supine; -15 seated   Supine and  -6 seated Supine -4.5,  Seated -6 Seated -4.5  Ankle dorsiflexion           Ankle plantarflexion           Ankle inversion           Ankle eversion            (Blank rows = not tested)  LOWER EXTREMITY MMT: Significant weakness of Lt hip and knee - mild quad set activation on Lt Unable to move Lt leg without external support unless in anti-gravity position Lt hip 2+/5 Lt knee 2+/5   FUNCTIONAL TESTS:  Eval: Sit to stand: heavy use of bil UE on armrests, using primarily Rt LE and kicks Lt LE out Transfers: needs external support or max A to transfer Lt LE into bed/onto table  06/19/2024: Timed up and Go (TUG):  21.96 sec with SPC 3 minute walk test: 358 ft with SPC  12/12;   Timed up and God (TUG):  7.90 no device, light use of Ues 3 minute walk test:  654 ft with no device  GAIT: Distance  walked:  Assistive device utilized: Environmental Consultant - 2 wheeled Level of assistance: Modified independence Comments: ambulates with Lt LE straight                                                                                                                                 TREATMENT DATE:   08/14/24:Pt arrives for aquatic physical therapy. Treatment took place in 3.5-5.5 feet of water . Water  temperature was 91 degrees F. Pt entered the pool via stairs independently. Pt requires buoyancy of water  for support and to offload joints with strengthening exercises.  Pt utilizes viscosity of the water  required for strengthening.  -75% depth water  walking 6 lenths for both forward and back -Sideways; straight knees one way, then mini squat back 6 lengths of each. -Sit in the chair squats in 50% depth; 2x10 -Forward step ups 2x20 f/b hamstring stretch 2x 3 breaths. -Horseback bicycle with yellow noodle 4  min -Seated cool down at bench including knee flexion stretching and AROM for flex/ext  08/12/2024: Recumbent bike level 2 x6 min with PT present to discuss status 2nd step: knee flexion stretch 10x- hold 10 seconds  2nd step: extension stretch 5x  6 inch step ups no UE support 2x10 6 inch step downs single UE support x15  6 inch lateral step ups on left with right hip abduction 10x light UE support Squatting holding sink basin for deeper squat 5x  Gastroc stretch on slant board at wall 3x 20 seconds  Resisted cable walk with belt 15# emphasizing push off with left 8x Proprioception/balance: step up on balance bad with stabilization x20 on Lt   08/07/2024: Recumbent bike level 2 x4 min with PT present to discuss status 2nd step: knee flexion stretch 10x 2nd step: extension stretch 10x 6 inch step ups no UE support 10x 4 inch step downs single UE support 10x (challenging) WB on left with UE reaches downward 11:00, 12:00, 2:00 5x 4 inch lateral step ups on left with right hip abduction 10x light UE  support Squatting holding sink basin for deeper squat 5x  Knee ROM as above Resisted cable walk with belt 15# emphasizing push off with left 8x Proprioception/balance: foam balance beam forwards and backwards; forward/backwards on beam with 3 hurdles    PATIENT EDUCATION:  Education details: Educated patient on anatomy and physiology of current symptoms, prognosis, plan of care as well as initial self care strategies to promote recovery Person educated: Patient Education method: Explanation Education comprehension: verbalized understanding  HOME EXERCISE PROGRAM:  Access Code: [email or web address] URL: [email or web address] Date: 07/10/2024 Prepared by: Glade Pesa  Exercises - Supine Quad Set  - 1 x daily - 7 x weekly - 2 sets - 10 reps - 3 sec hold - Supine Hip Abduction  - 1 x daily - 7 x weekly - 2 sets - 10 reps - Supine Knee Extension Stretch on Towel Roll  - 1 x daily - 7 x weekly - 3 sets - 10 reps - Seated Heel Slide  - 1 x daily - 7 x weekly - 3 sets - 10 reps - Supine Heel Slide with Strap  - 1 x daily - 7 x weekly - 2 sets - 10 reps - Small Range Straight Leg Raise  - 1 x daily - 7 x weekly - 2 sets - 10 reps - Sit to Stand with Armchair  - 1 x daily - 7 x weekly - 2 sets - 5 reps - Standing March with Counter Support  - 1 x daily - 7 x weekly - 1-2 sets - 10 reps - Standing Hip Abduction with Counter Support  - 1 x daily - 7 x weekly - 1-2 sets - 10 reps - Heel Raises with Counter Support  - 1 x daily - 7 x weekly - 1-2 sets - 10 reps - Standing Hip Extension with Counter Support  - 1 x daily - 7 x weekly - 1-2 sets - 10 reps - Standing Knee Flexion with Counter Support  - 1 x daily - 7 x weekly - 1-2 sets - 10 reps - Standing Knee Flexion Stretch on Step  - 1 x daily - 7 x weekly - 1 sets - 10 reps - Standing Hamstring Stretch with Step  - 1 x daily - 7 x weekly - 1 sets - 10 reps - Gastroc Stretch with Foot at Wall  - 1 x daily - 7 x weekly -  1 sets - 10 reps -  30 hold - Forward Step Up (Mirrored)  - 1 x daily - 7 x weekly - 1 sets - 10 reps - Seated Passive Knee Extension  - 1 x daily - 7 x weekly - 1 sets - 3 reps - 60 hold  ASSESSMENT:  CLINICAL IMPRESSION: Pt come to aquatic PT with some past experience walking in the water  prior to surgery. She is very motivated and eager to exercise in the water . She plans to take what she learned today and practice tomorrow in the same pool. She had zero knee pain/complaints and verbally reported she felt challenged in a different way in the water  that was good vs land.    OBJECTIVE IMPAIRMENTS: decreased activity tolerance, decreased balance, decreased mobility, difficulty walking, decreased ROM, decreased strength, increased edema, impaired perceived functional ability, and pain.   ACTIVITY LIMITATIONS: bending, standing, squatting, stairs, transfers, bathing, toileting, dressing, hygiene/grooming, and locomotion level  PARTICIPATION LIMITATIONS: meal prep, cleaning, laundry, driving, shopping, and community activity  PERSONAL FACTORS: 1-2 comorbidities: HTN are also affecting patient's functional outcome.   REHAB POTENTIAL: Good  CLINICAL DECISION MAKING: Stable/uncomplicated  EVALUATION COMPLEXITY: Low   GOALS: Goals reviewed with patient? Yes  SHORT TERM GOALS: Target date: 07/05/2024   The patient will demonstrate knowledge of basic self care strategies and exercises to promote healing  Baseline: Goal status: met 11/21  2.  Pt will be able to perform sit to stand with equal WB through bil LE with UE assist Baseline:  Goal status: met 12/3  3.  Pt will improve Lt knee A/ROM measured in supine to -4 to 90 deg Baseline:  Goal status: met 1/7  4.  Pt will demo symmetrical gait with walker  Baseline:  Goal status: met 11/21    LONG TERM GOALS: Target date: 09/27/2024   The patient will be independent in a safe self progression of a home exercise program to promote further recovery of  function  Baseline:  Goal status: Ongoing  2.  The patient will report a 60% improvement in pain levels with functional activities which are currently difficult including transfers, stairs, and bed mobility Baseline:  Goal status: Met on 07/19/24 (reports 80% improvement)  3.  The patient will have improved LE strength of at least 4+/5 needed to ascend and descend steps reciprocally  Baseline:  Goal status: Ongoing  4.  Pt will be able to perform household ambulation and short distance community ambulation with LRAD. Baseline:  Goal status: met 12/24  5. Establish an aquatic ex program appropriate for TKR  Has appt this week (08/12/24) NEW  6. Knee extension ROM improved to 3 degrees for normalized gait pattern as pt enjoys walking for leisure  -5 degrees (08/12/24) NEW  7.  Knee flexion ROM improved to 125 degrees needed for stepping up on large steps (transportation shuttles etc) and curbs NEW       PLAN:  PT FREQUENCY: 2x/week  PT DURATION: 8 weeks  PLANNED INTERVENTIONS: 97164- PT Re-evaluation, 97750- Physical Performance Testing, 97110-Therapeutic exercises, 97530- Therapeutic activity, [id number]- Neuromuscular re-education, 97535- Self Care, [id number]- Manual therapy, [id number]- Aquatic Therapy, [id number]- Electrical stimulation (unattended), [id number]- Electrical stimulation (manual), 97016- Vasopneumatic device, Patient/Family education, Balance training, Stair training, Taping, Joint mobilization, Cryotherapy, and Moist heat  PLAN FOR NEXT SESSION: Assess response to todays exercises.  Delon Darner, PTA 08/14/2024 12:24 PM    [facility] [address] [place], [place] [address] Phone # [phone] Fax  [phone] ° ° °"

## 2024-08-21 ENCOUNTER — Ambulatory Visit: Admitting: Physical Therapy

## 2024-08-21 ENCOUNTER — Encounter: Payer: Self-pay | Admitting: Physical Therapy

## 2024-08-21 DIAGNOSIS — M6281 Muscle weakness (generalized): Secondary | ICD-10-CM

## 2024-08-21 DIAGNOSIS — R600 Localized edema: Secondary | ICD-10-CM

## 2024-08-21 DIAGNOSIS — M25562 Pain in left knee: Secondary | ICD-10-CM

## 2024-08-21 NOTE — Therapy (Signed)
 " OUTPATIENT PHYSICAL THERAPY TREATMENT NOTE/DISCHARGE SUMMARY   Patient Name: Mary Graves MRN: [id number] DOB:14-Oct-1945, 79 y.o., female Today's Date: 08/21/2024      END OF SESSION:  PT End of Session - 08/21/24 1100     Visit Number 22    Date for Recertification  09/27/24    Authorization Type [employer]    PT Start Time 1100    PT Stop Time 1140    PT Time Calculation (min) 40 min    Activity Tolerance Patient tolerated treatment well            Past Medical History:  Diagnosis Date   Alcoholism (HCC)    Cancer of uvula (HCC)    2021   Heart murmur    Hypertension    Hypothyroidism    Past Surgical History:  Procedure Laterality Date   COLONOSCOPY     KNEE SURGERY Left    x 2   TONSILLECTOMY     TOTAL KNEE ARTHROPLASTY Left 06/04/2024   Procedure: ARTHROPLASTY, KNEE, TOTAL;  Surgeon: Ernie Cough, MD;  Location: [facility];  Service: Orthopedics;  Laterality: Left;   Patient Active Problem List   Diagnosis Date Noted   S/P total knee arthroplasty, left 06/04/2024   Elevated troponin 07/11/2014   Syncope 07/11/2014   PTSD (post-traumatic stress disorder) 10/31/2013   Alcoholism in family 10/31/2013   Dementia (HCC) 10/09/2013   HLD (hyperlipidemia) 02/27/2013   Osteopenia 02/27/2013   Alcohol  dependence (HCC) 06/01/2012   Major depression 06/01/2012   Hypothyroidism 02/23/2009   Essential hypertension 02/23/2009   HYPERGLYCEMIA 02/23/2009    PCP: Aisha Harvey MD  REFERRING PROVIDER: Ernie Cough MD  REFERRING DIAG: [phone] presence of left artificial knee joint  THERAPY DIAG:  Left knee pain; left knee stiffness Rationale for Evaluation and Treatment: Rehabilitation  ONSET DATE: surgery 06/04/24  SUBJECTIVE:   SUBJECTIVE STATEMENT: I liked the pool.  I'm pretty good about going to [facility].  Feels ready for discharge from PT.     PERTINENT HISTORY: Hx of Lt TKR 06/04/24 HTN; osteopenia; depression  Has [facility]  membership  PAIN: 08/21/24  Are you having pain? Yes NPRS scale:soreness only no pain 0/10 Pain location: left knee PAIN TYPE: aching Aggravating factors: certain movements/activity Relieving factors: cold pack   PRECAUTIONS: None     WEIGHT BEARING RESTRICTIONS: No  FALLS:  Has patient fallen in last 6 months? No  LIVING ENVIRONMENT: Lives with: lives alone but friend living with her now to help Lives in: House/apartment Stairs: Yes: External: 4 steps; on right going up Has following equipment at home: Single point cane and Environmental Consultant - 2 wheeled  OCCUPATION: retired but active doing service work Biochemist, [employer])  PLOF: Independent and Leisure: trail walking (3 miles)  PATIENT GOALS: be able to travel on an airplane by [employer]  NEXT MD VISIT: 06/20/24  OBJECTIVE:  Note: Objective measures were completed at Evaluation unless otherwise noted.  DIAGNOSTIC FINDINGS: OA left knee  PATIENT SURVEYS:  The Patient-Specific Functional Scale  Initial:  I am going to ask you to identify up to 3 important activities that you are unable to do or are having difficulty with as a result of this problem.  Today are there any activities that you are unable to do or having difficulty with because of this?  (Patient shown scale and patient rated each activity)  Follow up: When you first came in you had difficulty performing these activities.  Today do you still have difficulty?  Patient-Specific activity scoring scheme (Point to one number):  0 1 2 3 4 5 6 7 8 9  10 Unable                                                                                                          Able to perform To perform                                                                                                    activity at the same Activity         Level as before                                                                                                                       Injury or  problem  Activity  Get in/out of bed  Initial:     2                  follow up: 12/12 8 in bed, 5 out of bed    08/02/24: 9 2.  Get in/out of chair  Initial:      2                 follow up:  12/12 7                                      08/02/24:   8  3.  Walk to bathroom  Initial:      2                 follow up: 12/12   7                                     08/02/24:   9     COGNITION: Overall cognitive status: Within functional limits for tasks assessed     SENSATION: Anterior thigh numbness  EDEMA:  Eval:  Circumferential: Rt 35cm, Lt 40.5cm with ACE  bandage   LOWER EXTREMITY ROM:  Active ROM Right eval Left eval Left 06/17/24 11/21 Left 06/24/24 12/3 12/12 1/2 1/7 1/21  Hip flexion            Hip extension            Hip abduction            Hip adduction            Hip internal rotation            Hip external rotation            Knee flexion  30 A/ROM 55 P/ROM - limited by pain in quad 95  106 109 seated 114 supine  121 Supine, seated 120 123 seated 124 supine  Knee extension  -12 -12 -10 -8 -7.5 supine; -15 seated   Supine and  -6 seated Supine -4.5,  Seated -6 Seated -4.5 Supine 3  Ankle dorsiflexion            Ankle plantarflexion            Ankle inversion            Ankle eversion             (Blank rows = not tested)  LOWER EXTREMITY MMT: Significant weakness of Lt hip and knee - mild quad set activation on Lt Unable to move Lt leg without external support unless in anti-gravity position Lt hip 2+/5 Lt knee 2+/5   FUNCTIONAL TESTS:  Eval: Sit to stand: heavy use of bil UE on armrests, using primarily Rt LE and kicks Lt LE out Transfers: needs external support or max A to transfer Lt LE into bed/onto table  06/19/2024: Timed up and Go (TUG):  21.96 sec with SPC 3 minute walk test: 358 ft with SPC  12/12;   Timed up and God (TUG):  7.90 no device, light use of Ues 3 minute walk test:  654 ft with no device  GAIT: Distance walked:   Assistive device utilized: Environmental Consultant - 2 wheeled Level of assistance: Modified independence Comments: ambulates with Lt LE straight                                                                                                                                 TREATMENT DATE:  08/21/2024: Recumbent bike level 2 x5 min with PT present to discuss status 2nd step: knee flexion stretch 10x- hold 10 seconds  2nd step: extension stretch 5x  Slant board stretch  Knee ROM  Post rehab program at [facility] backward walking options Progressive HEP program  08/14/24:Pt arrives for aquatic physical therapy. Treatment took place in 3.5-5.5 feet of water . Water  temperature was 91 degrees F. Pt entered the pool via stairs independently. Pt requires buoyancy of water  for support and to offload joints with strengthening exercises.  Pt utilizes viscosity of the water  required for strengthening.  -75% depth  water  walking 6 lenths for both forward and back -Sideways; straight knees one way, then mini squat back 6 lengths of each. -Sit in the chair squats in 50% depth; 2x10 -Forward step ups 2x20 f/b hamstring stretch 2x 3 breaths. -Horseback bicycle with yellow noodle 4 min -Seated cool down at bench including knee flexion stretching and AROM for flex/ext  08/12/2024: Recumbent bike level 2 x6 min with PT present to discuss status 2nd step: knee flexion stretch 10x- hold 10 seconds  2nd step: extension stretch 5x  6 inch step ups no UE support 2x10 6 inch step downs single UE support x15  6 inch lateral step ups on left with right hip abduction 10x light UE support Squatting holding sink basin for deeper squat 5x  Gastroc stretch on slant board at wall 3x 20 seconds  Resisted cable walk with belt 15# emphasizing push off with left 8x Proprioception/balance: step up on balance bad with stabilization x20 on Lt   08/07/2024: Recumbent bike level 2 x4 min with PT present to discuss status 2nd step:  knee flexion stretch 10x 2nd step: extension stretch 10x 6 inch step ups no UE support 10x 4 inch step downs single UE support 10x (challenging) WB on left with UE reaches downward 11:00, 12:00, 2:00 5x 4 inch lateral step ups on left with right hip abduction 10x light UE support Squatting holding sink basin for deeper squat 5x  Knee ROM as above Resisted cable walk with belt 15# emphasizing push off with left 8x Proprioception/balance: foam balance beam forwards and backwards; forward/backwards on beam with 3 hurdles    PATIENT EDUCATION:  Education details: Educated patient on anatomy and physiology of current symptoms, prognosis, plan of care as well as initial self care strategies to promote recovery Person educated: Patient Education method: Explanation Education comprehension: verbalized understanding  HOME EXERCISE PROGRAM:  Access Code: [email or web address] URL: [email or web address] Date: 07/10/2024 Prepared by: Glade Pesa  Exercises - Supine Quad Set  - 1 x daily - 7 x weekly - 2 sets - 10 reps - 3 sec hold - Supine Hip Abduction  - 1 x daily - 7 x weekly - 2 sets - 10 reps - Supine Knee Extension Stretch on Towel Roll  - 1 x daily - 7 x weekly - 3 sets - 10 reps - Seated Heel Slide  - 1 x daily - 7 x weekly - 3 sets - 10 reps - Supine Heel Slide with Strap  - 1 x daily - 7 x weekly - 2 sets - 10 reps - Small Range Straight Leg Raise  - 1 x daily - 7 x weekly - 2 sets - 10 reps - Sit to Stand with Armchair  - 1 x daily - 7 x weekly - 2 sets - 5 reps - Standing March with Counter Support  - 1 x daily - 7 x weekly - 1-2 sets - 10 reps - Standing Hip Abduction with Counter Support  - 1 x daily - 7 x weekly - 1-2 sets - 10 reps - Heel Raises with Counter Support  - 1 x daily - 7 x weekly - 1-2 sets - 10 reps - Standing Hip Extension with Counter Support  - 1 x daily - 7 x weekly - 1-2 sets - 10 reps - Standing Knee Flexion with Counter Support  - 1 x daily - 7 x weekly  - 1-2 sets - 10 reps - Standing Knee Flexion Stretch on Step  - 1 x  daily - 7 x weekly - 1 sets - 10 reps - Standing Hamstring Stretch with Step  - 1 x daily - 7 x weekly - 1 sets - 10 reps - Gastroc Stretch with Foot at Wall  - 1 x daily - 7 x weekly - 1 sets - 10 reps - 30 hold - Forward Step Up (Mirrored)  - 1 x daily - 7 x weekly - 1 sets - 10 reps - Seated Passive Knee Extension  - 1 x daily - 7 x weekly - 1 sets - 3 reps - 60 hold  ASSESSMENT:  CLINICAL IMPRESSION: The patient has met the majority of rehab goals, with noted improvements in pain reduction, outcome score, ROM, strength and functional mobility.  A comprehensive HEP has been established and anticipate further improvements over time with regular performance of the program.  Recommend discharge from PT at this time.    OBJECTIVE IMPAIRMENTS: decreased activity tolerance, decreased balance, decreased mobility, difficulty walking, decreased ROM, decreased strength, increased edema, impaired perceived functional ability, and pain.   ACTIVITY LIMITATIONS: bending, standing, squatting, stairs, transfers, bathing, toileting, dressing, hygiene/grooming, and locomotion level  PARTICIPATION LIMITATIONS: meal prep, cleaning, laundry, driving, shopping, and community activity  PERSONAL FACTORS: 1-2 comorbidities: HTN are also affecting patient's functional outcome.   REHAB POTENTIAL: Good  CLINICAL DECISION MAKING: Stable/uncomplicated  EVALUATION COMPLEXITY: Low   GOALS: Goals reviewed with patient? Yes  SHORT TERM GOALS: Target date: 07/05/2024   The patient will demonstrate knowledge of basic self care strategies and exercises to promote healing  Baseline: Goal status: met 11/21  2.  Pt will be able to perform sit to stand with equal WB through bil LE with UE assist Baseline:  Goal status: met 12/3  3.  Pt will improve Lt knee A/ROM measured in supine to -4 to 90 deg Baseline:  Goal status: met 1/7  4.  Pt will  demo symmetrical gait with walker  Baseline:  Goal status: met 11/21    LONG TERM GOALS: Target date: 09/27/2024   The patient will be independent in a safe self progression of a home exercise program to promote further recovery of function  Baseline:  Goal status: met 1/21  2.  The patient will report a 60% improvement in pain levels with functional activities which are currently difficult including transfers, stairs, and bed mobility Baseline:  Goal status: Met on 07/19/24 (reports 80% improvement)  3.  The patient will have improved LE strength of at least 4+/5 needed to ascend and descend steps reciprocally  Baseline:  Goal status: met 1/21  4.  Pt will be able to perform household ambulation and short distance community ambulation with LRAD. Baseline:  Goal status: met 12/24  5. Establish an aquatic ex program appropriate for TKR  Has appt this week (08/12/24) Met 1/21  6. Knee extension ROM improved to 3 degrees for normalized gait pattern as pt enjoys walking for leisure  -5 degrees (08/12/24) Met 1/21  7.  Knee flexion ROM improved to 125 degrees needed for stepping up on large steps (transportation shuttles etc) and curbs Partially met        PLAN: PHYSICAL THERAPY DISCHARGE SUMMARY  Visits from Start of Care: 22  Current functional level related to goals / functional outcomes: See clinical impressions above   Remaining deficits: As above   Education / Equipment: As above   Patient agrees to discharge. Patient goals were met. Patient is being discharged due to meeting the stated rehab  goals.   Glade Pesa, PT 08/21/24 11:43 AM Phone: [phone] Fax: [phone]   [facility] [address], [address] [place], [place] [address] Phone # [phone] Fax [phone]   "

## 2024-08-23 ENCOUNTER — Ambulatory Visit: Admitting: Physical Therapy

## 2024-08-26 ENCOUNTER — Ambulatory Visit (HOSPITAL_BASED_OUTPATIENT_CLINIC_OR_DEPARTMENT_OTHER): Admitting: Physical Therapy

## 2024-08-28 ENCOUNTER — Ambulatory Visit
